# Patient Record
Sex: Male | Born: 1940 | Race: White | Hispanic: No | Marital: Married | Smoking: Former smoker
Health system: Southern US, Community
[De-identification: ages and names within clinical notes are randomized; demographics above are authoritative.]

## PROBLEM LIST (undated history)

## (undated) DIAGNOSIS — T7840XA Allergy, unspecified, initial encounter: Secondary | ICD-10-CM

## (undated) DIAGNOSIS — F32A Depression, unspecified: Secondary | ICD-10-CM

## (undated) DIAGNOSIS — I447 Left bundle-branch block, unspecified: Secondary | ICD-10-CM

## (undated) DIAGNOSIS — I472 Ventricular tachycardia, unspecified: Secondary | ICD-10-CM

## (undated) DIAGNOSIS — R9431 Abnormal electrocardiogram [ECG] [EKG]: Secondary | ICD-10-CM

## (undated) DIAGNOSIS — M1990 Unspecified osteoarthritis, unspecified site: Secondary | ICD-10-CM

## (undated) DIAGNOSIS — I10 Essential (primary) hypertension: Secondary | ICD-10-CM

## (undated) DIAGNOSIS — I495 Sick sinus syndrome: Secondary | ICD-10-CM

## (undated) DIAGNOSIS — R012 Other cardiac sounds: Secondary | ICD-10-CM

## (undated) DIAGNOSIS — G4730 Sleep apnea, unspecified: Secondary | ICD-10-CM

## (undated) DIAGNOSIS — Z950 Presence of cardiac pacemaker: Secondary | ICD-10-CM

## (undated) DIAGNOSIS — M5126 Other intervertebral disc displacement, lumbar region: Secondary | ICD-10-CM

## (undated) DIAGNOSIS — E7800 Pure hypercholesterolemia, unspecified: Secondary | ICD-10-CM

## (undated) DIAGNOSIS — L570 Actinic keratosis: Secondary | ICD-10-CM

## (undated) DIAGNOSIS — Z95810 Presence of automatic (implantable) cardiac defibrillator: Secondary | ICD-10-CM

## (undated) HISTORY — DX: Other intervertebral disc displacement, lumbar region: M51.26

## (undated) HISTORY — DX: Abnormal electrocardiogram (ECG) (EKG): R94.31

## (undated) HISTORY — PX: REPLACEMENT TOTAL KNEE: SUR1224

## (undated) HISTORY — DX: Ventricular tachycardia, unspecified: I47.20

## (undated) HISTORY — DX: Sick sinus syndrome: I49.5

## (undated) HISTORY — DX: Pure hypercholesterolemia, unspecified: E78.00

## (undated) HISTORY — PX: JOINT REPLACEMENT: SHX530

## (undated) HISTORY — DX: Unspecified osteoarthritis, unspecified site: M19.90

## (undated) HISTORY — DX: Sleep apnea, unspecified: G47.30

## (undated) HISTORY — DX: Other cardiac sounds: R01.2

## (undated) HISTORY — DX: Depression, unspecified: F32.A

## (undated) HISTORY — DX: Essential (primary) hypertension: I10

## (undated) HISTORY — PX: VASECTOMY: SHX75

## (undated) HISTORY — PX: OTHER SURGICAL HISTORY: SHX169

## (undated) HISTORY — DX: Allergy, unspecified, initial encounter: T78.40XA

## (undated) HISTORY — DX: Ventricular tachycardia: I47.2

## (undated) HISTORY — DX: Presence of cardiac pacemaker: Z95.0

## (undated) HISTORY — DX: Left bundle-branch block, unspecified: I44.7

## (undated) HISTORY — PX: INSERT / REPLACE / REMOVE PACEMAKER: SUR710

## (undated) HISTORY — DX: Presence of automatic (implantable) cardiac defibrillator: Z95.810

## (undated) HISTORY — PX: TONSILLECTOMY: SUR1361

## (undated) HISTORY — PX: CARDIAC PACEMAKER PLACEMENT: SHX583

---

## 1969-07-28 DIAGNOSIS — T148XXA Other injury of unspecified body region, initial encounter: Secondary | ICD-10-CM

## 1969-07-28 HISTORY — DX: Other injury of unspecified body region, initial encounter: T14.8XXA

## 1969-07-28 HISTORY — PX: VASECTOMY: SHX75

## 2003-11-28 DIAGNOSIS — R55 Syncope and collapse: Secondary | ICD-10-CM

## 2003-11-28 HISTORY — DX: Syncope and collapse: R55

## 2003-12-18 ENCOUNTER — Ambulatory Visit: Admit: 2003-12-18 | Disposition: A | Discharge status: Home / Self Care | Payer: Self-pay | Source: Ambulatory Visit

## 2003-12-29 DIAGNOSIS — I495 Sick sinus syndrome: Secondary | ICD-10-CM

## 2003-12-29 HISTORY — DX: Sick sinus syndrome: I49.5

## 2004-01-04 ENCOUNTER — Ambulatory Visit
Admission: AD | Admit: 2004-01-04 | Disposition: A | Discharge status: Home / Self Care | Payer: Self-pay | Source: Ambulatory Visit | Admitting: Cardiovascular Disease

## 2004-01-05 ENCOUNTER — Inpatient Hospital Stay
Admission: RE | Admit: 2004-01-05 | Disposition: A | Discharge status: Home / Self Care | Payer: Self-pay | Source: Ambulatory Visit | Admitting: Cardiovascular Disease

## 2005-09-12 ENCOUNTER — Ambulatory Visit: Admission: RE | Admit: 2005-09-12 | Payer: Self-pay | Source: Ambulatory Visit | Admitting: Gastroenterology

## 2007-03-12 ENCOUNTER — Emergency Department
Admission: EM | Admit: 2007-03-12 | Disposition: A | Discharge status: Home / Self Care | Payer: Self-pay | Source: Emergency Department | Admitting: Emergency Medicine

## 2007-03-13 LAB — COMPREHENSIVE METABOLIC PANEL
ALT: 42 U/L (ref 7–56)
AST (SGOT): 48 U/L — ABNORMAL HIGH (ref 5–40)
Albumin, Synovial: 5.1 g/dL — ABNORMAL HIGH (ref 3.9–5.0)
Alkaline Phosphatase: 112 U/L (ref 38–126)
BUN / Creatinine Ratio: 15 (ref 8–20)
BUN: 19 mg/dL (ref 6–20)
Bilirubin, Total: 0.8 mg/dL (ref 0.2–1.3)
CO2: 24 mmol/L (ref 21.0–31.0)
Calcium: 9 mg/dL (ref 8.4–10.2)
Chloride: 94 mmol/L — ABNORMAL LOW (ref 101–111)
Creatinine: 1.25 mg/dL (ref 0.5–1.4)
EGFR: >60 mL/min/{1.73_m2}
EGFR: >60 mL/min/{1.73_m2}
Glucose: 90 mg/dL (ref 70–100)
Potassium: 3.5 mmol/L — ABNORMAL LOW (ref 3.6–5.0)
Protein, Total: 8.5 g/dL — ABNORMAL HIGH (ref 6.3–8.2)
Sodium: 134 mmol/L — ABNORMAL LOW (ref 135–145)

## 2007-03-13 LAB — ^MANUAL DIFFERENTIAL MCKESSON
Band Neutrophils Manual: 8 %
CELLS COUNTED: 100
Lymphocytes Manual: 3 % — ABNORMAL LOW (ref 25–55)
Monocytes: 8 % (ref 1–8)
Platelet Evaluation: NORMAL
RBC Morphology: NORMAL
Segmented Neutrophils: 81 % — ABNORMAL HIGH (ref 49–69)

## 2007-03-13 LAB — URINALYSIS
Bilirubin, UA: NEGATIVE
Glucose, UA: NEGATIVE
Leukocyte Esterase, UA: NEGATIVE
Nitrate: NEGATIVE
Protein, UR: NEGATIVE
Specific Gravity, UR: 1.025 (ref 1.000–1.035)
Urobilinogen, UA: NORMAL
pH, Urine: 6 (ref 5.0–8.0)

## 2007-03-13 LAB — ^CBC WITH DIFF MCKESSON
Hematocrit: 41 % (ref 39.0–49.0)
Hemoglobin: 14.3 g/dL (ref 13.2–17.3)
MCH: 31.3 pg (ref 27.0–34.0)
MCHC: 34.8 % (ref 32.0–36.0)
MCV: 89.8 fL (ref 80–100)
Platelets: 254 10*3/uL (ref 150–400)
RBC: 4.56 /mm3 (ref 3.80–5.40)
RDW: 12.2 % (ref 11.0–14.0)
WBC: 9.4 10*3/uL (ref 4.8–10.8)

## 2007-03-13 LAB — URINALYSIS WITH MICROSCOPIC

## 2007-03-13 LAB — LIPASE: Lipase: 34 U/L (ref 23–300)

## 2007-03-13 LAB — CK: Creatine Kinase (CK): 120 U/L (ref 19–216)

## 2007-03-13 LAB — TROPONIN I: Troponin I: 0.016 ng/dL (ref 0.000–0.034)

## 2007-11-09 ENCOUNTER — Emergency Department
Admission: EM | Admit: 2007-11-09 | Disposition: A | Discharge status: Home / Self Care | Payer: Self-pay | Source: Emergency Department | Admitting: Emergency Medicine

## 2007-11-10 LAB — COMPREHENSIVE METABOLIC PANEL
ALT: 34 U/L (ref 7–56)
AST (SGOT): 35 U/L (ref 5–40)
Albumin, Synovial: 4.8 g/dL (ref 3.9–5.0)
Alkaline Phosphatase: 109 U/L (ref 38–126)
BUN / Creatinine Ratio: 15 (ref 8–20)
BUN: 18 mg/dL (ref 6–20)
Bilirubin, Total: 0.6 mg/dL (ref 0.2–1.3)
CO2: 22 mmol/L (ref 21.0–31.0)
Calcium: 9.4 mg/dL (ref 8.4–10.2)
Chloride: 98 mmol/L — ABNORMAL LOW (ref 101–111)
Creatinine: 1.25 mg/dL (ref 0.5–1.4)
EGFR: >60 mL/min/{1.73_m2}
EGFR: >60 mL/min/{1.73_m2}
Glucose: 111 mg/dL — ABNORMAL HIGH (ref 70–100)
Potassium: 3.6 mmol/L (ref 3.6–5.0)
Protein, Total: 7.9 g/dL (ref 6.3–8.2)
Sodium: 137 mmol/L (ref 135–145)

## 2007-11-10 LAB — ^MANUAL DIFFERENTIAL MCKESSON
Band Neutrophils Manual: 5 %
CELLS COUNTED: 100
Lymphocytes Manual: 2 % — ABNORMAL LOW (ref 25–55)
Monocytes: 8 % (ref 1–8)
Platelet Evaluation: NORMAL
RBC Morphology: NORMAL
Segmented Neutrophils: 85 % — ABNORMAL HIGH (ref 49–69)

## 2007-11-10 LAB — ^CBC WITH DIFF MCKESSON
Hematocrit: 39.3 % (ref 39.0–49.0)
Hemoglobin: 13.9 g/dL (ref 13.2–17.3)
MCH: 32 pg (ref 27.0–34.0)
MCHC: 35.3 % (ref 32.0–36.0)
MCV: 90.6 fL (ref 80–100)
Platelets: 236 10*3/uL (ref 150–400)
RBC: 4.34 /mm3 (ref 3.80–5.40)
RDW: 11.5 % (ref 11.0–14.0)
WBC: 8.6 10*3/uL (ref 4.8–10.8)

## 2010-11-27 HISTORY — PX: COLONOSCOPY: SHX174

## 2012-09-03 ENCOUNTER — Encounter: Payer: Self-pay | Admitting: Anesthesiology

## 2012-09-03 ENCOUNTER — Encounter
Admission: RE | Disposition: A | Discharge status: Home / Self Care | Payer: Self-pay | Source: Ambulatory Visit | Attending: Cardiovascular Disease

## 2012-09-03 ENCOUNTER — Ambulatory Visit: Payer: Medicare Other | Admitting: Cardiovascular Disease

## 2012-09-03 ENCOUNTER — Ambulatory Visit: Payer: Medicare Other | Admitting: Anesthesiology

## 2012-09-03 ENCOUNTER — Ambulatory Visit
Admission: RE | Admit: 2012-09-03 | Discharge: 2012-09-03 | Disposition: A | Discharge status: Home / Self Care | Payer: Medicare Other | Source: Ambulatory Visit | Attending: Cardiovascular Disease | Admitting: Cardiovascular Disease

## 2012-09-03 DIAGNOSIS — I10 Essential (primary) hypertension: Secondary | ICD-10-CM | POA: Insufficient documentation

## 2012-09-03 DIAGNOSIS — G4730 Sleep apnea, unspecified: Secondary | ICD-10-CM | POA: Insufficient documentation

## 2012-09-03 DIAGNOSIS — E785 Hyperlipidemia, unspecified: Secondary | ICD-10-CM | POA: Insufficient documentation

## 2012-09-03 DIAGNOSIS — Z7982 Long term (current) use of aspirin: Secondary | ICD-10-CM | POA: Insufficient documentation

## 2012-09-03 DIAGNOSIS — Z87891 Personal history of nicotine dependence: Secondary | ICD-10-CM | POA: Insufficient documentation

## 2012-09-03 DIAGNOSIS — I447 Left bundle-branch block, unspecified: Secondary | ICD-10-CM | POA: Insufficient documentation

## 2012-09-03 DIAGNOSIS — I442 Atrioventricular block, complete: Secondary | ICD-10-CM | POA: Insufficient documentation

## 2012-09-03 DIAGNOSIS — Z45018 Encounter for adjustment and management of other part of cardiac pacemaker: Secondary | ICD-10-CM | POA: Insufficient documentation

## 2012-09-03 SURGERY — PM GENERATOR CHANGE DUAL/BI-V
Anesthesia: Anesthesia General

## 2012-09-03 MED ORDER — HYDROMORPHONE HCL PF 1 MG/ML IJ SOLN
0.5000 mg | INTRAMUSCULAR | Status: DC | PRN
Start: 2012-09-03 — End: 2012-09-03

## 2012-09-03 MED ORDER — BACITRACIN 50000 UNITS IM SOLR
INTRAMUSCULAR | Status: AC
Start: 2012-09-03 — End: 2012-09-03
  Administered 2012-09-03: 50000 [IU]
  Filled 2012-09-03: qty 50000

## 2012-09-03 MED ORDER — LIDOCAINE HCL 2 % IJ SOLN
INTRAMUSCULAR | Status: DC | PRN
Start: 2012-09-03 — End: 2012-09-03
  Administered 2012-09-03: 60 mg

## 2012-09-03 MED ORDER — MIDAZOLAM HCL 2 MG/2ML IJ SOLN
INTRAMUSCULAR | Status: DC
Start: 2012-09-03 — End: 2012-09-03
  Filled 2012-09-03: qty 1

## 2012-09-03 MED ORDER — LACTATED RINGERS IV SOLN
INTRAVENOUS | Status: DC
Start: 2012-09-03 — End: 2012-09-03

## 2012-09-03 MED ORDER — PROPOFOL INFUSION 10 MG/ML
INTRAVENOUS | Status: DC | PRN
Start: 2012-09-03 — End: 2012-09-03
  Administered 2012-09-03: 80 ug/kg/min via INTRAVENOUS

## 2012-09-03 MED ORDER — ONDANSETRON HCL 4 MG/2ML IJ SOLN
INTRAMUSCULAR | Status: DC
Start: 2012-09-03 — End: 2012-09-03
  Filled 2012-09-03: qty 2

## 2012-09-03 MED ORDER — METOCLOPRAMIDE HCL 5 MG/ML IJ SOLN
10.0000 mg | Freq: Once | INTRAMUSCULAR | Status: DC | PRN
Start: 2012-09-03 — End: 2012-09-03

## 2012-09-03 MED ORDER — PROPOFOL 10 MG/ML IV EMUL
INTRAVENOUS | Status: DC
Start: 2012-09-03 — End: 2012-09-03
  Filled 2012-09-03: qty 1

## 2012-09-03 MED ORDER — ONDANSETRON HCL 4 MG/2ML IJ SOLN
INTRAMUSCULAR | Status: DC | PRN
Start: 2012-09-03 — End: 2012-09-03
  Administered 2012-09-03: 4 mg via INTRAVENOUS

## 2012-09-03 MED ORDER — MORPHINE SULFATE 2 MG/ML IJ/IV SOLN (WRAP)
2.0000 mg | INTRAVENOUS | Status: DC | PRN
Start: 2012-09-03 — End: 2012-09-03

## 2012-09-03 MED ORDER — PROMETHAZINE HCL 25 MG/ML IJ SOLN
6.2500 mg | Freq: Once | INTRAMUSCULAR | Status: DC | PRN
Start: 2012-09-03 — End: 2012-09-03

## 2012-09-03 MED ORDER — CEFAZOLIN 1 GM MBP (CNR)
Status: AC
Start: 2012-09-03 — End: 2012-09-03
  Administered 2012-09-03: 2 g via INTRAVENOUS
  Filled 2012-09-03: qty 100

## 2012-09-03 MED ORDER — MIDAZOLAM HCL 2 MG/2ML IJ SOLN
INTRAMUSCULAR | Status: DC | PRN
Start: 2012-09-03 — End: 2012-09-03
  Administered 2012-09-03: 2 mg via INTRAVENOUS

## 2012-09-03 MED ORDER — FENTANYL CITRATE 0.05 MG/ML IJ SOLN
INTRAMUSCULAR | Status: DC | PRN
Start: 2012-09-03 — End: 2012-09-03
  Administered 2012-09-03: 50 ug via INTRAVENOUS

## 2012-09-03 MED ORDER — LIDOCAINE HCL (PF) 1 % IJ SOLN
INTRAMUSCULAR | Status: AC
Start: 2012-09-03 — End: 2012-09-03
  Administered 2012-09-03: 10 mL via SUBCUTANEOUS
  Filled 2012-09-03: qty 1

## 2012-09-03 MED ORDER — MEPERIDINE HCL 25 MG/ML IJ SOLN
12.5000 mg | Freq: Two times a day (BID) | INTRAMUSCULAR | Status: DC | PRN
Start: 2012-09-03 — End: 2012-09-03

## 2012-09-03 MED ORDER — FENTANYL CITRATE 0.05 MG/ML IJ SOLN
INTRAMUSCULAR | Status: DC
Start: 2012-09-03 — End: 2012-09-03
  Filled 2012-09-03: qty 2

## 2012-09-03 MED ORDER — SODIUM CHLORIDE 0.9 % IV SOLN
INTRAVENOUS | Status: DC | PRN
Start: 2012-09-03 — End: 2012-09-03

## 2012-09-03 NOTE — H&P (Signed)
Justin Cisneros    Date of Birth:  December 26, 1940  Age:  71 yrs.  __  CURRENT DIAGNOSES       1. 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 - Dyspnea, 786.00    3. Sleep Apnea, 786.09    4. - Chest Pain, Unspecified, 786.50    5. - Abnormal EKG, 794.31    6. Pacemaker - Medtronic EnPulse [id number] DDDR (01/05/2004), V45.01    7. Abnormal heart sounds, 785.3    8. - Hypercholesterolemia, 272.0    9. - Hypertension, 401.1    10. 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 Aspirin 81 mg Tablet, Chewable, 1 p.o. q.d.    2. Trazodone 50 mg Tablet, 2 p.o. qHS    3. doxazosin 4 mg tablet, 1 po qd    4. losartan-hydrochlorothiazide 100-12.5 mg tablet, 1 po qd    5. simvastatin 40 mg tablet, 1 po qd    6. multivitamin capsule, 1 po qd  __  CHIEF COMPLAINT/REASON FOR VISIT  Followup of complete heart block  __  HISTORY OF PRESENT ILLNESS  Justin Cisneros returned for follow-up of his pacemaker recently, and his device was found to be at [facility]. He has been feeling more easily tired since the device went into ERI status and changed to VVI at a fixed rate of 65 bpm one month ago. Otherwise, he has not had any dyspnea, chest discomfort, lightheadedness, orthopnea, PND or ankle swelling.    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, Rate related LBBB;  Infectious Diseases:  Usual childhood illnesses of mumps, measles and chickenpox;  Surgical Procedures:  Medtronic pacemaker placement 01/05/2004, vasectomy;  Trauma History:  No previous history of significant trauma.;  Cardiology Procedures-Invasive:  No previous interventional or invasive cardiology procedures.;  Cardiology Procedures-Noninvasive:  Chemical Dual Isotope December 2004, 12/08, 4/11, Echo 1/05, Holter 2/05;  Left Ventricular Ejection Fraction:  LVEF of 47% documented via nuclear study on 03/24/2010  __  FAMILY HISTORY     Father - died  form emphysema at age 55;  Mother - died at 38 and Osteoporosis;   __  SOCIAL HISTORY       Alcohol Use: drinks daily;  Smoking: Quit 1/05; Diet: Regular diet without modifications and Caffeine use-3-4 per day; Exercise: Exercises regularly, walking and weights; Occupation: Retired from [employer];   __  REVIEW OF SYSTEMS    General: feels well, no change in exercise tolerance.; Integumentary: Denies any change in hair or nails, rashes, or skin lesions.; Eyes: wears eye glasses/contact lenses; Ears, Nose, Throat, Mouth: Denies any hearing loss, epistaxis, hoarseness or difficulty speaking.;Respiratory: sleep apnea; Cardiovascular: Please review HPI; Abdominal : Denies ulcer disease, hematochezia or melena.;Musculoskeletal:Denies any history of venous insufficiency, arthritic symptoms or back problems.; Neurological : Denies any history of recurrent strokes, TIA, or seizure disorder.; Psychiatric: stress associated due to his wife being dx. with cancer; Endocrine: hyperlipidemia; Hematologic/Immunologic: Denies any food allergies, seasonal allergies, bleeding disorders.  __  PHYSICAL EXAMINATION    Vital Signs:  Blood Pressure:              Constitutional: Cooperative, alert and oriented,well developed, well nourished, in no acute distress. Skin: Warm and dry to touch, no apparent skin lesions, or masses noted. Head: Normocephalic,  normal hair pattern, no masses or tenderness Eyes: EOMS Intact, PERRL, conjunctivae and lids unremarkable.  Funduscopic exam and visual fields not performed. ENT: Ears, Nose and throat reveal no gross abnormalities.  No pallor or cyanosis.  Dentition good. Neck: No palpable masses or adenopathy, no thyromegaly, no JVD, carotid pulses are full and equal bilaterally without bruits. Chest: Normal symmetry, no tenderness to palpation, normal respiratory excursion, no intercostal retraction, no use of accessory muscles, normal diaphragmatic excursion, clear to auscultation and percussion., incision  healing well Cardiac: Regular rhythm, S1 normal, S2 normal, Apical impulse not displaced, no murmurs, gallops or rubs detected Abdomen: Abdomen soft, bowel sounds normoactive, no masses, no hepatosplenomegaly, non-tender, no bruits Peripheral Pulses: The femoral, popliteal, dorsalis pedis, and posterior tibial pulses are full and equal bilaterally with no bruits auscultated.    Extremities/Back: No deformities, clubbing, cyanosis, erythema or edema observed.  There are no spinal abnormalities noted. Normal muscle strength and tone.   Neurological: No gross motor or sensory deficits noted, affect appropriate, oriented to time, person and place.   __    Medications added today by the physician:      MPRESSION:  1. Pacemaker battery depletion  2. 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 Hypertension well controlled  4. Hyperlipidemia well controlled.  5. Sleep apnea.    RECOMMENDATION:  I will perform pacemaker generator replacement today.    Justin Cisneros L. Justin Mage, MD    cc:          Justin Cisneros. [facility] MD

## 2012-09-03 NOTE — Brief Op Note (Signed)
HANDS-OFF REPORT GIVEN TO J RIDDERFORD

## 2012-09-03 NOTE — Progress Notes (Signed)
Pt tolerated the procedure well. Pt was given discharge instructions by Dr Trish Mage and there was no questions. Pt is aware of the need to take antibiotics as prescribed by Dr Trish Mage. Pt had no questions. Pt discharged home with his girlfriend.

## 2012-09-03 NOTE — Anesthesia Preprocedure Evaluation (Signed)
Anesthesia Evaluation    AIRWAY    Mallampati: II    TM distance: >3 FB  Neck ROM: full  Mouth Opening:full   CARDIOVASCULAR    cardiovascular exam normal     DENTAL         PULMONARY    pulmonary exam normal     OTHER FINDINGS                  Anesthesia Plan    ASA 2   general   Detailed anesthesia plan: general IV      Post op pain management: per surgeon        intravenous induction     informed consent obtained    Plan discussed with CRNA.             Chart reviewed, labs reviewed, patient interviewed, history confirmed. Surgical procedure, including location, confirmed with patient.      Allergies:  No Known Allergies  All Rx:  Scheduled Meds:     Continuous Infusions:     PRN Meds:.     Problem List:  There are no active problems to display for this patient.    History:  Past Medical History   Diagnosis Date   . Sleep apnea    . Hypertensive disorder    . Bradycardia    . Sick sinus syndrome    . Dyspnea      Past Surgical History   Procedure Date   . Cardiac pacemaker placement    . Vasectomy      Labs    Lab Results   Component Value Date    WBC 8.6 11/09/2007    HGB 13.9 11/09/2007    HCT 39.3 11/09/2007    PLT 236 11/09/2007    ALT 34 11/09/2007    AST 35 11/09/2007    NA 137 11/09/2007    K 3.6 11/09/2007    CL 98* 11/09/2007    CO2 22 11/09/2007    CREAT 1.25 11/09/2007    BUN 18 11/09/2007    GLU 111* 11/09/2007     Water this am at 730        Patient deemed acceptable risk to proceed with planned procedure and anesthesia.    Anesthesia plan discussed fully with patient. Risk, benefits and alternatives discussed in detail. Patient understands and agrees with plan. See paper chart for signed informed consent.

## 2012-09-03 NOTE — Transfer of Care (Signed)
Anesthesia Transfer of Care Note    Patient: Justin Cisneros    Procedures performed: Procedure(s) with comments:  PM GENERATOR CHANGE DUAL/BI-V    Anesthesia type: General TIVA    Patient location:PACU    Last vitals:   Filed Vitals:    09/03/12 1206   BP: 88/57   Pulse: 60   Temp: 97 F (36.1 C)   Resp: 16   SpO2: 99%       Post pain: Patient not complaining of pain, continue current therapy     Mental Status:awake    Respiratory Function: tolerating room air    Cardiovascular: stable    Nausea/Vomiting: patient not complaining of nausea or vomiting    Hydration Status: adequate    Post assessment: no apparent anesthetic complications

## 2012-09-03 NOTE — Anesthesia Postprocedure Evaluation (Signed)
At the conclusion of the procedure, the patient was uneventfully transported to the PACU in stable condition with good ventilatory exchange. Uneventful transition to PACU care.    At this time the patient is awake/easily arousable. The patient's respirations, and cardiovascular status have been evaluated and deemed stable. Post op nausea, vomiting and pain are being evaluated, treated and controlled as effectively as possible without compromising the patients respiratory and cardiovascular status.    Review of input and output information, assessment of cardiovascular course and current physical findings are consistent with adequate hydrational support. Please refer to PACU nursing documentation for confirmation of attainment of normothermia.    There were no anesthetic-related complications evident at this time.    The patient is recovering well and a smooth transition to the next phase of care is anticipated.

## 2012-09-03 NOTE — Brief Op Note (Signed)
Procedure: PPM Gen Change  Dx: Battery depletion, CHB  Electrophysiologist: Zollie Beckers L. Trish Mage MD  Anesthesia: General  EBL: Minimal  Complications: None

## 2012-11-29 NOTE — Op Note (Signed)
MRN: [id number] DOCUMENT ID: [id number]      INTRODUCTION:      72 YEAR OLD MALE PATIENT PRESENTS FOR AN ELECTIVE OUTPATIENT COLONOSCOPY.      THE INDICATION FOR THE PROCEDURE WAS AVERAGE RISK SCREENING FOR COLON      CANCER.      CLINICAL HISTORY  PHYSICAL EXAMINATION:      THE PATIENT'S CLINICAL HISTORY AND PHYSICAL EXAMINATION WERE PERFORMED AND      ARE DOCUMENTED IN THE PATIENT'S RECORD.      CONSENT:      THE BENEFITS, RISKS, AND ALTERNATIVES TO THE PROCEDURE WERE DISCUSSED AND      INFORMED CONSENT WAS OBTAINED FROM THE PATIENT.      PREPARATION:      EKG, PULSE, PULSE OXIMETRY AND BLOOD PRESSURE MONITORED.      MEDICATIONS:      - MAC ANESTHESIA WAS ADMINISTERED DURING THE PROCEDURE      PROCEDURE:      RECTAL EXAM: NORMAL.      THE ENDOSCOPE WAS PASSED WITH EASE UNDER DIRECT VISUALIZATION TO THE CECUM      CONFIRMED BY LANDMARKS, APPENDICEAL ORIFICE, CECAL STRAP (CROW'S FOOT),      ILEOCECAL VALVE AND RLQ PALPATION.  RETROFLEXION WAS PERFORMED IN THE      RECTUM.  THE QUALITY OF THE PREPARATION WAS FAIR.      FINDINGS:  THERE WERE MULTIPLE MEDIUM DIVERTICULA PRESENT IN THE SIGMOID,      THE DESCENDING COLON, THE TRANSVERSE COLON AND THE ASCENDING COLON.  THE      COLONOSCOPY WAS OTHERWISE NORMAL.      COMPLICATIONS:      THERE WERE NO COMPLICATIONS ASSOCIATED WITH THE PROCEDURE.      IMPRESSION:      1.  MULTIPLE MEDIUM DIVERTICULA IN THE SIGMOID, THE DESCENDING COLON, THE      TRANSVERSE COLON AND THE ASCENDING COLON. [562.10].      2.  COLONOSCOPY, OTHERWISE NORMAL.      RECOMMENDATION:      - HIGH FIBER DIET.      - REPEAT COLONOSCOPY IN 7 TO 10 YEARS.      PROCEDURE CODES:      11914: COLONOSCOPY TO CECUM.      SIGNING PHYSICIAN: Twanna Resh,R A

## 2013-06-09 ENCOUNTER — Ambulatory Visit
Admission: RE | Admit: 2013-06-09 | Discharge: 2013-06-09 | Disposition: A | Discharge status: Home / Self Care | Payer: Medicare Other | Source: Ambulatory Visit | Attending: Orthopaedic Surgery | Admitting: Orthopaedic Surgery

## 2013-06-09 ENCOUNTER — Other Ambulatory Visit: Payer: Self-pay | Admitting: Orthopaedic Surgery

## 2013-06-09 DIAGNOSIS — M2390 Unspecified internal derangement of unspecified knee: Secondary | ICD-10-CM | POA: Insufficient documentation

## 2013-06-09 DIAGNOSIS — M1710 Unilateral primary osteoarthritis, unspecified knee: Secondary | ICD-10-CM | POA: Insufficient documentation

## 2013-11-12 ENCOUNTER — Ambulatory Visit: Payer: Medicare Other

## 2013-11-12 NOTE — Pre-Procedure Instructions (Addendum)
DOS Dec 02 728 ARRIVE 0600  PT WENT TO JOINT CAMP WITH HIS WIFE DEC 16

## 2013-11-21 ENCOUNTER — Other Ambulatory Visit: Payer: Medicare Other

## 2013-11-24 ENCOUNTER — Ambulatory Visit
Admission: RE | Admit: 2013-11-24 | Discharge: 2013-11-24 | Disposition: A | Discharge status: Home / Self Care | Payer: Medicare Other | Source: Ambulatory Visit | Attending: Orthopaedic Surgery | Admitting: Orthopaedic Surgery

## 2013-11-24 DIAGNOSIS — Z01818 Encounter for other preprocedural examination: Secondary | ICD-10-CM | POA: Insufficient documentation

## 2013-12-01 ENCOUNTER — Inpatient Hospital Stay: Admission: RE | Admit: 2013-12-01 | Payer: Medicare Other | Source: Ambulatory Visit

## 2013-12-01 NOTE — Anesthesia Preprocedure Evaluation (Addendum)
Anesthesia Evaluation    AIRWAY    Mallampati: II    TM distance: >3 FB  Neck ROM: full  Mouth Opening:full   CARDIOVASCULAR    cardiovascular exam normal       DENTAL    No notable dental hx     PULMONARY    pulmonary exam normal and clear to auscultation     OTHER FINDINGS              73 yo M with HTN, SSS (s/p pacer)          Anesthesia Plan    ASA 3     general               (NSR Pacer in place for SSS  Cleared by cardiology)              Post op pain management: per surgeon    informed consent obtained

## 2013-12-02 ENCOUNTER — Ambulatory Visit: Payer: Medicare Other | Admitting: Orthopaedic Surgery

## 2013-12-02 ENCOUNTER — Inpatient Hospital Stay: Payer: Medicare Other

## 2013-12-02 ENCOUNTER — Inpatient Hospital Stay: Payer: Medicare Other | Admitting: Anesthesiology

## 2013-12-02 ENCOUNTER — Encounter: Payer: Self-pay | Admitting: Anesthesiology

## 2013-12-02 ENCOUNTER — Ambulatory Visit
Admission: RE | Admit: 2013-12-02 | Discharge: 2013-12-03 | Disposition: A | Discharge status: Home / Self Care | Payer: Medicare Other | Source: Ambulatory Visit | Attending: Orthopaedic Surgery | Admitting: Orthopaedic Surgery

## 2013-12-02 ENCOUNTER — Encounter
Admission: RE | Disposition: A | Discharge status: Home / Self Care | Payer: Self-pay | Source: Ambulatory Visit | Attending: Orthopaedic Surgery

## 2013-12-02 DIAGNOSIS — Z7982 Long term (current) use of aspirin: Secondary | ICD-10-CM | POA: Insufficient documentation

## 2013-12-02 DIAGNOSIS — K5900 Constipation, unspecified: Secondary | ICD-10-CM | POA: Insufficient documentation

## 2013-12-02 DIAGNOSIS — IMO0002 Reserved for concepts with insufficient information to code with codable children: Secondary | ICD-10-CM | POA: Insufficient documentation

## 2013-12-02 DIAGNOSIS — M1990 Unspecified osteoarthritis, unspecified site: Secondary | ICD-10-CM | POA: Diagnosis present

## 2013-12-02 DIAGNOSIS — I495 Sick sinus syndrome: Secondary | ICD-10-CM | POA: Insufficient documentation

## 2013-12-02 DIAGNOSIS — E785 Hyperlipidemia, unspecified: Secondary | ICD-10-CM | POA: Insufficient documentation

## 2013-12-02 DIAGNOSIS — I10 Essential (primary) hypertension: Secondary | ICD-10-CM | POA: Insufficient documentation

## 2013-12-02 DIAGNOSIS — Z950 Presence of cardiac pacemaker: Secondary | ICD-10-CM

## 2013-12-02 DIAGNOSIS — Z95810 Presence of automatic (implantable) cardiac defibrillator: Secondary | ICD-10-CM | POA: Insufficient documentation

## 2013-12-02 DIAGNOSIS — Z79899 Other long term (current) drug therapy: Secondary | ICD-10-CM | POA: Insufficient documentation

## 2013-12-02 DIAGNOSIS — Z9989 Dependence on other enabling machines and devices: Secondary | ICD-10-CM | POA: Diagnosis present

## 2013-12-02 DIAGNOSIS — I447 Left bundle-branch block, unspecified: Secondary | ICD-10-CM | POA: Insufficient documentation

## 2013-12-02 DIAGNOSIS — Z7901 Long term (current) use of anticoagulants: Secondary | ICD-10-CM | POA: Insufficient documentation

## 2013-12-02 DIAGNOSIS — G4733 Obstructive sleep apnea (adult) (pediatric): Secondary | ICD-10-CM | POA: Insufficient documentation

## 2013-12-02 DIAGNOSIS — Z87891 Personal history of nicotine dependence: Secondary | ICD-10-CM | POA: Insufficient documentation

## 2013-12-02 HISTORY — PX: ARTHROPLASTY, KNEE, UNICONDYLAR PROSTHESIS, MAKOPLASTY: SHX3141

## 2013-12-02 LAB — TYPE AND SCREEN
AB Screen Gel: NEGATIVE
ABO Rh: O NEG

## 2013-12-02 LAB — HEMOGLOBIN AND HEMATOCRIT, BLOOD
Hematocrit: 37.4 % — ABNORMAL LOW (ref 42.0–52.0)
Hgb: 12 g/dL — ABNORMAL LOW (ref 13.0–17.0)

## 2013-12-02 SURGERY — ARTHROPLASTY, KNEE, UNICONDYLAR PROSTHESIS, MAKOPLASTY
Anesthesia: Anesthesia General | Site: Knee | Laterality: Right | Wound class: Clean

## 2013-12-02 MED ORDER — DOCUSATE SODIUM 100 MG PO CAPS
100.0000 mg | ORAL_CAPSULE | Freq: Two times a day (BID) | ORAL | Status: DC
Start: 2013-12-02 — End: 2013-12-03
  Administered 2013-12-02 – 2013-12-03 (×2): 100 mg via ORAL
  Filled 2013-12-02 (×2): qty 1

## 2013-12-02 MED ORDER — LIDOCAINE HCL 2 % IJ SOLN
INTRAMUSCULAR | Status: DC | PRN
Start: 2013-12-02 — End: 2013-12-02
  Administered 2013-12-02: 60 mg

## 2013-12-02 MED ORDER — BUPIVACAINE-EPINEPHRINE (PF) 0.5% -1:200000 IJ SOLN
INTRAMUSCULAR | Status: AC
Start: 2013-12-02 — End: ?
  Filled 2013-12-02: qty 30

## 2013-12-02 MED ORDER — LIDOCAINE 1% BUFFERED - CNR/OUTSOURCED
INTRAMUSCULAR | Status: AC
Start: 2013-12-02 — End: ?
  Filled 2013-12-02: qty 22

## 2013-12-02 MED ORDER — PHENYLEPHRINE HCL 10 MG/ML IJ SOLN
INTRAMUSCULAR | Status: AC
Start: 2013-12-02 — End: ?
  Filled 2013-12-02: qty 1

## 2013-12-02 MED ORDER — SODIUM CHLORIDE 0.9 % IV MBP
1.0000 g | Freq: Three times a day (TID) | INTRAVENOUS | Status: AC
Start: 2013-12-02 — End: 2013-12-02
  Administered 2013-12-02 (×2): 1 g via INTRAVENOUS
  Filled 2013-12-02 (×2): qty 1000

## 2013-12-02 MED ORDER — NON FORMULARY
100.0000 mg | Freq: Every day | Status: DC
Start: 2013-12-02 — End: 2013-12-02

## 2013-12-02 MED ORDER — RIVAROXABAN 10 MG PO TABS
10.0000 mg | ORAL_TABLET | Freq: Every day | ORAL | Status: DC
Start: 2013-12-02 — End: 2017-09-24

## 2013-12-02 MED ORDER — LIDOCAINE 1% BUFFERED - CNR/OUTSOURCED
0.3000 mL | Freq: Once | INTRAMUSCULAR | Status: AC
Start: 2013-12-02 — End: 2013-12-02
  Administered 2013-12-02: 0.3 mL via INTRADERMAL

## 2013-12-02 MED ORDER — OXYCODONE HCL ER 10 MG PO T12A
20.0000 mg | EXTENDED_RELEASE_TABLET | Freq: Once | ORAL | Status: AC
Start: 2013-12-02 — End: 2013-12-02

## 2013-12-02 MED ORDER — HYDROMORPHONE HCL PF 1 MG/ML IJ SOLN
INTRAMUSCULAR | Status: AC
Start: 2013-12-02 — End: 2013-12-02
  Administered 2013-12-02: 0.5 mg via INTRAVENOUS
  Filled 2013-12-02: qty 1

## 2013-12-02 MED ORDER — CELECOXIB 200 MG PO CAPS
ORAL_CAPSULE | ORAL | Status: AC
Start: 2013-12-02 — End: 2013-12-02
  Administered 2013-12-02: 200 mg via ORAL
  Filled 2013-12-02: qty 1

## 2013-12-02 MED ORDER — DIPHENHYDRAMINE HCL 50 MG/ML IJ SOLN
12.5000 mg | INTRAMUSCULAR | Status: DC | PRN
Start: 2013-12-02 — End: 2013-12-02

## 2013-12-02 MED ORDER — HYDRALAZINE HCL 20 MG/ML IJ SOLN
5.0000 mg | Freq: Once | INTRAMUSCULAR | Status: DC
Start: 2013-12-02 — End: 2013-12-02

## 2013-12-02 MED ORDER — OXYCODONE HCL ER 10 MG PO T12A
EXTENDED_RELEASE_TABLET | ORAL | Status: AC
Start: 2013-12-02 — End: 2013-12-02
  Administered 2013-12-02: 20 mg via ORAL
  Filled 2013-12-02: qty 2

## 2013-12-02 MED ORDER — LACTATED RINGERS IV SOLN
INTRAVENOUS | Status: DC
Start: 2013-12-02 — End: 2013-12-02
  Administered 2013-12-02: 1000 mL via INTRAVENOUS
  Administered 2013-12-02: 20 mL/h via INTRAVENOUS

## 2013-12-02 MED ORDER — PHENYLEPHRINE 100 MCG/ML IV BOLUS (ANESTHESIA)
PREFILLED_SYRINGE | INTRAVENOUS | Status: DC | PRN
Start: 2013-12-02 — End: 2013-12-02
  Administered 2013-12-02 (×5): 100 ug via INTRAVENOUS

## 2013-12-02 MED ORDER — CEFAZOLIN SODIUM 1 G IJ SOLR
2.0000 g | Freq: Once | INTRAMUSCULAR | Status: AC
Start: 2013-12-02 — End: 2013-12-02
  Administered 2013-12-02: 2 g via INTRAVENOUS

## 2013-12-02 MED ORDER — OXYCODONE-ACETAMINOPHEN 5-325 MG PO TABS
2.0000 | ORAL_TABLET | ORAL | Status: DC | PRN
Start: 2013-12-02 — End: 2013-12-03
  Administered 2013-12-02 – 2013-12-03 (×3): 2 via ORAL
  Filled 2013-12-02 (×3): qty 2

## 2013-12-02 MED ORDER — HYDROMORPHONE HCL PF 1 MG/ML IJ SOLN
0.5000 mg | INTRAMUSCULAR | Status: DC | PRN
Start: 2013-12-02 — End: 2013-12-02
  Administered 2013-12-02 (×2): 0.5 mg via INTRAVENOUS

## 2013-12-02 MED ORDER — DOXAZOSIN MESYLATE 4 MG PO TABS
4.0000 mg | ORAL_TABLET | Freq: Every evening | ORAL | Status: DC
Start: 2013-12-02 — End: 2013-12-03
  Administered 2013-12-02: 4 mg via ORAL
  Filled 2013-12-02 (×3): qty 1

## 2013-12-02 MED ORDER — MORPHINE SULFATE 2 MG/ML IJ/IV SOLN (WRAP)
2.0000 mg | INTRAVENOUS | Status: DC | PRN
Start: 2013-12-02 — End: 2013-12-02

## 2013-12-02 MED ORDER — LOSARTAN POTASSIUM 25 MG PO TABS
100.0000 mg | ORAL_TABLET | Freq: Every day | ORAL | Status: DC
Start: 2013-12-02 — End: 2013-12-03
  Administered 2013-12-02: 100 mg via ORAL
  Filled 2013-12-02: qty 4

## 2013-12-02 MED ORDER — HYDROCHLOROTHIAZIDE 12.5 MG PO TABS
12.5000 mg | ORAL_TABLET | Freq: Every day | ORAL | Status: DC
Start: 2013-12-02 — End: 2013-12-03
  Administered 2013-12-02: 12.5 mg via ORAL
  Filled 2013-12-02: qty 1

## 2013-12-02 MED ORDER — BACITRACIN 500 UNIT/GM EX OINT
TOPICAL_OINTMENT | CUTANEOUS | Status: AC
Start: 2013-12-02 — End: ?
  Filled 2013-12-02: qty 0.9

## 2013-12-02 MED ORDER — SODIUM CHLORIDE 0.9 % IV SOLN
INTRAVENOUS | Status: DC
Start: 2013-12-02 — End: 2013-12-03

## 2013-12-02 MED ORDER — TRAZODONE HCL 100 MG PO TABS
100.0000 mg | ORAL_TABLET | Freq: Every evening | ORAL | Status: DC
Start: 2013-12-02 — End: 2013-12-03
  Administered 2013-12-02: 100 mg via ORAL
  Filled 2013-12-02: qty 1

## 2013-12-02 MED ORDER — MORPHINE SULFATE 10 MG/ML IJ SOLN
INTRAMUSCULAR | Status: DC | PRN
Start: 2013-12-02 — End: 2013-12-02
  Administered 2013-12-02: 10 mg via INTRAMUSCULAR

## 2013-12-02 MED ORDER — OXYCODONE-ACETAMINOPHEN 5-325 MG PO TABS
1.0000 | ORAL_TABLET | Freq: Once | ORAL | Status: DC | PRN
Start: 2013-12-02 — End: 2013-12-02

## 2013-12-02 MED ORDER — PROPOFOL 10 MG/ML IV EMUL
INTRAVENOUS | Status: AC
Start: 2013-12-02 — End: ?
  Filled 2013-12-02: qty 20

## 2013-12-02 MED ORDER — PROPOFOL INFUSION 10 MG/ML
INTRAVENOUS | Status: DC | PRN
Start: 2013-12-02 — End: 2013-12-02
  Administered 2013-12-02: 100 mg via INTRAVENOUS

## 2013-12-02 MED ORDER — ALBUTEROL SULFATE (2.5 MG/3ML) 0.083% IN NEBU
2.5000 mg | INHALATION_SOLUTION | Freq: Once | RESPIRATORY_TRACT | Status: DC
Start: 2013-12-02 — End: 2013-12-02

## 2013-12-02 MED ORDER — OXYCODONE-ACETAMINOPHEN 5-325 MG PO TABS
1.0000 | ORAL_TABLET | ORAL | Status: DC | PRN
Start: 2013-12-02 — End: 2017-09-24

## 2013-12-02 MED ORDER — MIDAZOLAM HCL 2 MG/2ML IJ SOLN
INTRAMUSCULAR | Status: AC
Start: 2013-12-02 — End: ?
  Filled 2013-12-02: qty 2

## 2013-12-02 MED ORDER — MAGNESIUM HYDROXIDE 400 MG/5ML PO SUSP
10.0000 mL | Freq: Every day | ORAL | Status: DC | PRN
Start: 2013-12-02 — End: 2013-12-03

## 2013-12-02 MED ORDER — HYDROMORPHONE HCL PF 1 MG/ML IJ SOLN
0.5000 mg | INTRAMUSCULAR | Status: DC | PRN
Start: 2013-12-02 — End: 2013-12-03
  Administered 2013-12-02 – 2013-12-03 (×3): 0.5 mg via INTRAVENOUS
  Filled 2013-12-02 (×3): qty 1

## 2013-12-02 MED ORDER — HYDROMORPHONE HCL PF 1 MG/ML IJ SOLN
INTRAMUSCULAR | Status: AC
Start: 2013-12-02 — End: 2013-12-03
  Filled 2013-12-02: qty 1

## 2013-12-02 MED ORDER — PREGABALIN 100 MG PO CAPS
100.0000 mg | ORAL_CAPSULE | Freq: Once | ORAL | Status: AC
Start: 2013-12-02 — End: 2013-12-02

## 2013-12-02 MED ORDER — FENTANYL CITRATE 0.05 MG/ML IJ SOLN
INTRAMUSCULAR | Status: AC
Start: 2013-12-02 — End: ?
  Filled 2013-12-02: qty 2

## 2013-12-02 MED ORDER — OXYCODONE HCL ER 10 MG PO T12A
10.0000 mg | EXTENDED_RELEASE_TABLET | Freq: Two times a day (BID) | ORAL | Status: DC
Start: 2013-12-02 — End: 2013-12-03
  Administered 2013-12-02 – 2013-12-03 (×2): 10 mg via ORAL
  Filled 2013-12-02 (×2): qty 1

## 2013-12-02 MED ORDER — ONDANSETRON HCL 4 MG/2ML IJ SOLN
INTRAMUSCULAR | Status: DC | PRN
Start: 2013-12-02 — End: 2013-12-02
  Administered 2013-12-02: 4 mg via INTRAVENOUS

## 2013-12-02 MED ORDER — MORPHINE SULFATE 10 MG/ML IJ/IV SOLN (WRAP)
Status: AC
Start: 2013-12-02 — End: ?
  Filled 2013-12-02: qty 1

## 2013-12-02 MED ORDER — CELECOXIB 200 MG PO CAPS
200.0000 mg | ORAL_CAPSULE | Freq: Once | ORAL | Status: AC
Start: 2013-12-02 — End: 2013-12-02

## 2013-12-02 MED ORDER — VITAMIN C 500 MG PO TABS
500.0000 mg | ORAL_TABLET | Freq: Every day | ORAL | Status: DC
Start: 2013-12-02 — End: 2013-12-03
  Administered 2013-12-02 – 2013-12-03 (×2): 500 mg via ORAL
  Filled 2013-12-02 (×2): qty 1

## 2013-12-02 MED ORDER — FERROUS SULFATE 324 (65 FE) MG PO TBEC
324.0000 mg | DELAYED_RELEASE_TABLET | Freq: Every morning | ORAL | Status: DC
Start: 2013-12-03 — End: 2013-12-03
  Administered 2013-12-03: 324 mg via ORAL
  Filled 2013-12-02: qty 1

## 2013-12-02 MED ORDER — CEFAZOLIN SODIUM 1 G IJ SOLR
INTRAMUSCULAR | Status: AC
Start: 2013-12-02 — End: 2013-12-02
  Filled 2013-12-02: qty 2000

## 2013-12-02 MED ORDER — DSS 100 MG PO CAPS
100.0000 mg | ORAL_CAPSULE | Freq: Two times a day (BID) | ORAL | Status: DC
Start: 2013-12-02 — End: 2017-09-24

## 2013-12-02 MED ORDER — ONDANSETRON HCL 4 MG/2ML IJ SOLN
4.0000 mg | Freq: Three times a day (TID) | INTRAMUSCULAR | Status: DC | PRN
Start: 2013-12-02 — End: 2013-12-03

## 2013-12-02 MED ORDER — PREGABALIN 100 MG PO CAPS
ORAL_CAPSULE | ORAL | Status: AC
Start: 2013-12-02 — End: 2013-12-02
  Administered 2013-12-02: 100 mg via ORAL
  Filled 2013-12-02: qty 1

## 2013-12-02 MED ORDER — LIDOCAINE HCL (PF) 2 % IJ SOLN
INTRAMUSCULAR | Status: AC
Start: 2013-12-02 — End: ?
  Filled 2013-12-02: qty 5

## 2013-12-02 MED ORDER — DIPHENHYDRAMINE HCL 25 MG PO CAPS
25.0000 mg | ORAL_CAPSULE | Freq: Two times a day (BID) | ORAL | Status: DC | PRN
Start: 2013-12-02 — End: 2013-12-03

## 2013-12-02 MED ORDER — FENTANYL CITRATE 0.05 MG/ML IJ SOLN
INTRAMUSCULAR | Status: DC | PRN
Start: 2013-12-02 — End: 2013-12-02
  Administered 2013-12-02 (×4): 25 ug via INTRAVENOUS

## 2013-12-02 MED ORDER — SIMVASTATIN 40 MG PO TABS
40.0000 mg | ORAL_TABLET | Freq: Every evening | ORAL | Status: DC
Start: 2013-12-02 — End: 2013-12-03
  Filled 2013-12-02: qty 1

## 2013-12-02 MED ORDER — KETOROLAC TROMETHAMINE 30 MG/ML IJ SOLN
INTRAMUSCULAR | Status: DC | PRN
Start: 2013-12-02 — End: 2013-12-02
  Administered 2013-12-02: 30 mg via INTRAMUSCULAR

## 2013-12-02 MED ORDER — MEPERIDINE HCL 25 MG/ML IJ SOLN
12.5000 mg | INTRAMUSCULAR | Status: DC | PRN
Start: 2013-12-02 — End: 2013-12-02

## 2013-12-02 MED ORDER — PROMETHAZINE HCL 25 MG/ML IJ SOLN
6.2500 mg | Freq: Once | INTRAMUSCULAR | Status: DC | PRN
Start: 2013-12-02 — End: 2013-12-02

## 2013-12-02 MED ORDER — RIVAROXABAN 10 MG PO TABS
10.0000 mg | ORAL_TABLET | Freq: Every morning | ORAL | Status: DC
Start: 2013-12-03 — End: 2013-12-03
  Administered 2013-12-03: 10 mg via ORAL
  Filled 2013-12-02: qty 1

## 2013-12-02 MED ORDER — OXYCODONE HCL ER 10 MG PO T12A
10.0000 mg | EXTENDED_RELEASE_TABLET | Freq: Two times a day (BID) | ORAL | Status: DC
Start: 2013-12-02 — End: 2017-09-24

## 2013-12-02 MED ORDER — ONDANSETRON HCL 4 MG/2ML IJ SOLN
4.0000 mg | Freq: Once | INTRAMUSCULAR | Status: DC | PRN
Start: 2013-12-02 — End: 2013-12-02

## 2013-12-02 MED ORDER — MIDAZOLAM HCL 2 MG/2ML IJ SOLN
INTRAMUSCULAR | Status: DC | PRN
Start: 2013-12-02 — End: 2013-12-02
  Administered 2013-12-02: 2 mg via INTRAVENOUS

## 2013-12-02 MED ORDER — BUPIVACAINE-EPINEPHRINE (PF) 0.5% -1:200000 IJ SOLN
INTRAMUSCULAR | Status: DC | PRN
Start: 2013-12-02 — End: 2013-12-02
  Administered 2013-12-02: 30 mL via INTRAMUSCULAR

## 2013-12-02 MED ORDER — ONDANSETRON HCL 4 MG/2ML IJ SOLN
INTRAMUSCULAR | Status: AC
Start: 2013-12-02 — End: ?
  Filled 2013-12-02: qty 2

## 2013-12-02 MED ORDER — KETOROLAC TROMETHAMINE 30 MG/ML IJ SOLN
INTRAMUSCULAR | Status: AC
Start: 2013-12-02 — End: ?
  Filled 2013-12-02: qty 1

## 2013-12-02 MED ORDER — OXYCODONE-ACETAMINOPHEN 5-325 MG PO TABS
1.0000 | ORAL_TABLET | ORAL | Status: DC | PRN
Start: 2013-12-02 — End: 2013-12-03

## 2013-12-02 SURGICAL SUPPLY — 94 items
ADHESIVE SKIN SURGISEAL .35ML (Suture) IMPLANT
APPLCATOR CHLORAPREP 26ML (Prep) ×6 IMPLANT
BASEPLATE TIB 5 RESTORIS STRL ONLAY (Base) ×1 IMPLANT
BASEPLATE TIBIAL 5 KNEE RIGHT MEDIAL LEFT LATERAL ONLAY (Base) ×1 IMPLANT
BASEPLATE TIBIAL 5 KNEE RT MEDIAL LT (Base) ×1 IMPLANT
BATTERY SRG DRVR LF (Other)
BATTERY SURGICAL DRIVER INSTRUMENT NA (Other) IMPLANT
BLADE SAW SAGITTAL L90 MM X W25 MM X (Blade)
BLADE SAW SAGITTAL L90 MM X W25 MM X H1.27 MM (Blade) IMPLANT
BLADE SW 90X25X1.27MM LF STRL SGTL (Blade)
BONE PIN SELF DRILL 4X140MM (Guide Pin) ×4 IMPLANT
BURR FLUTE BALL OD6 MM SURGICAL ANSPACH (Burr) ×1 IMPLANT
BURR SRG FLUT BALL ANSPACH 6MM (Burr) ×1
CEMENT BN TBR SMPX P STRL FD RADOPQ (Cement) ×1 IMPLANT
CEMENT BONE RADIOPAQUE FULL DOSE SIMPLEX (Cement) ×1 IMPLANT
CEMENT BONE RADIOPAQUE FULL DOSE SIMPLEX P TOBRAMYCIN (Cement) ×1 IMPLANT
CLIP IRR MAKO 26.7CM (Clips) ×1
CLIP IRRIGATION L26.7 CM MAKO (Clips) ×1 IMPLANT
COMPONENT FEM 5 RESTORIS STRL KN RT MDL (Component) ×1 IMPLANT
COMPONENT FEMORAL 5 KNEE RIGHT MEDIAL LEFT LATERAL RESTORIS MCK SYSTEM (Component) ×1 IMPLANT
COMPONENT FEMORAL 5 KNEE RT MEDIAL LT (Component) ×1 IMPLANT
CUFF TOURNIQUET CYLINDRICAL L30 IN X W4 IN 2 PORT 1 BLADDER QUICK (Procedure Accessories) IMPLANT
CUFF TRNQT CYL CLR CUF 30X4IN LF STRL 2 (Procedure Accessories)
CURETTE BN PLS QUIK-USE (Procedure Accessories) ×1
CURETTE QUIK-USE BONE PLASTIC (Procedure Accessories) ×1 IMPLANT
DRAPE SHEET ASTOUND XL (Drape) ×2 IMPLANT
DRAPE SRG PLS U STRDRP 51X47IN LF STRL (Drape) ×1
DRAPE SURGICAL ADHESIVE L51 IN X W47 IN (Drape) ×1
DRAPE SURGICAL ADHESIVE L51 IN X W47 IN STERI-DRAPE CLEAR (Drape) ×1 IMPLANT
DRESSING GRMCDL HDRFB IONIC SLVR PU AQCL (Dressing) ×2 IMPLANT
GLOVE SURG BIOGEL ORTHO SZ8 (Glove) ×2 IMPLANT
GLOVE SURG SUPER-SENSER SZ8 (Glove) ×6 IMPLANT
GOWN SURG MICROCOOL STRL LG (Gown) ×2 IMPLANT
HANDPIECE INTERPLUSE HIGH FLOW (Cautery) ×2 IMPLANT
HOOD T4 STERI-SHIELD (Personal Protection) ×6 IMPLANT
INSERT TIB 5 RESTORIS 8MM LF STRL ONLAY (Component) ×1 IMPLANT
INSERT TIBIAL 5 RESTORIS H8 MM KNEE (Component) ×1 IMPLANT
INSERT TIBIAL 5 RESTORIS H8 MM KNEE ONLAY MULTICOMPARTMENTAL (Component) ×1 IMPLANT
KIT DRAPE 1 PIECE POCKET RIO RIO (Drape) ×1 IMPLANT
KIT DRP RIO 1 PC PCKT RIO (Drape) ×1
KIT TOTAL KNEE DR AKHTAR (Pack) ×2 IMPLANT
MANIFOLD NEPTUNE II 4 PORT (Procedure Accessories) ×2 IMPLANT
MARKER SRG CKPNT LF STRL KT DISP (Kits) ×1
MARKER SURGICAL CHECKPOINT KIT (Kits) ×1 IMPLANT
MIXER CEMENT BONE MIXEVAC3 (Ortho Supply) IMPLANT
NEEDLE INJ SFTY 22GX1.5IN (Needles) ×2 IMPLANT
PAD ELECTROSRG GRND REM W CRD (Procedure Accessories) ×2 IMPLANT
PAD LEG ARMBOARD POSITIONING DE MAYO (Bandage) ×1 IMPLANT
PAD PSTN DE MAYO STRL LG (Bandage) ×1
PIN FIXATION OD3.18 MM PRELOAD L90 MM (Procedure Accessories)
PIN FIXATION OD3.18 MM PRELOAD L90 MM PINABALL (Procedure Accessories) IMPLANT
PIN FX PINABALL 3.18MM 90MM STRL PRELD (Procedure Accessories)
SHEET SPLIT (Drape) ×2 IMPLANT
SHEET TIBURON LAP TRANS 77X122 (Drape) ×2 IMPLANT
SLEEVE CMPR NYL MED THG LGTH SCD EXP LF (Sleeve) ×2
SLEEVE COMPRESSION NYLON MEDIUM THIGH LENGTH KENDALL ADJUSTABLE (Sleeve) ×1 IMPLANT
SOL H2O STERILE POUR 1500ML (Irrigation Solutions) ×2 IMPLANT
SOLUTION IRR 0.9% NACL 3L ARTHMTC LF (Irrigation Solutions) ×1
SOLUTION IRRIGATION 0.9% SODIUM CHLORIDE (Irrigation Solutions) ×1 IMPLANT
SOLUTION IV 0.9% NACL 1000ML VFLX LF PLS (IV Solutions) ×1
SOLUTION IV 0.9% SODIUM CHLORIDE PVC (IV Solutions) ×1
SOLUTION IV 0.9% SODIUM CHLORIDE PVC 1000 ML PH 5 PLASTIC CONTAINER (IV Solutions) ×1 IMPLANT
SOLUTION PRP 4% CHG 4OZ SCR CR EXDN ANMC (Prep) ×2 IMPLANT
SPONGE GAUZE L4 IN X W4 IN 16 PLY (Sponge) ×1
SPONGE GAUZE L4 IN X W4 IN 16 PLY MAXIMUM ABSORBENT TRAY CURITY (Sponge) ×1 IMPLANT
SPONGE GZE PLS CTTN CRTY 4X4IN LF STRL (Sponge) ×1
SPONGE LAP 18X18IN PREWASH WHT (Sponge) ×2
SPONGE LAPAROTOMY L18 IN X W18 IN (Sponge) ×2
SPONGE LAPAROTOMY L18 IN X W18 IN PREWASH WHITE (Sponge) ×2 IMPLANT
STOCKING CMPR NYL MED REG THG LGTH TED (Patient Supply) ×2
STOCKING COMPRESSION MEDIUM REGULAR THIGH LENGTH 2 PLY HEEL POCKET TED (Patient Supply) ×1 IMPLANT
SUTURE ABS 1 CTX PDS2 36IN MFL VIOL (Suture) ×1
SUTURE ABS 3-0 PS1 MNCRL MTPS 27IN MFL (Suture) ×1
SUTURE MONOCRYL 3-0 PS-1 L27 IN (Suture) ×1
SUTURE MONOCRYL 3-0 PS-1 L27 IN MONOFILAMENT UNDYED ABSORBABLE (Suture) ×1 IMPLANT
SUTURE PDS II 1 CTX L36 IN MONOFILAMENT (Suture) ×1
SUTURE PDS II 1 CTX L36 IN MONOFILAMENT VIOLET ABSORBABLE (Suture) ×1 IMPLANT
SUTURE PDS II 1 TP1 48" (Suture) ×2 IMPLANT
SUTURE VICRYL 1 CTX 36IN (Suture) ×2 IMPLANT
SUTURE VICRYL 2-0 CT1 36IN (Suture) ×4 IMPLANT
SYRINGE IRR 70CC TM LF STRL LL ADPR TIP (Syringes, Needles) IMPLANT
SYRINGE IRRIG TOOMEY STRL 70CC (Syringes, Needles)
SYRINGE LEUR LOK TIP 30 ML (Syringes, Needles) ×2 IMPLANT
SYSTEM NAVIGATION KNEE TRACK KIT (Kits) ×1
SYSTEM NAVIGATION KNEE TRACK KIT VIZADISC (Kits) ×1 IMPLANT
SYSTEM NVG VIZADISC LF STRL KN TRK KT (Kits) ×1
TOURNIQUET 34X4 PURPLE (Procedure Accessories) ×2 IMPLANT
TOURNIQUET 44X4 BLUE (Procedure Accessories) IMPLANT
TRAY LUBRISIL IC U/M FOLEY 16 (Tray) IMPLANT
TRAY SKIN SCRUB L8 IN 6 WING 6 SPONGE STICK 2 TIP APPLICATOR DRY VINYL (Prep) ×1 IMPLANT
TRAY SKIN SCRUB MEDLINE L8 IN VINYL (Prep) ×1
TRAY SKN SCRB VNYL CTTN 8IN LF STRL 6 (Prep) ×1
TUBING ANSPCH HI FLO IRR EMAX2 (Tubing) ×2
TUBING IRRIGATION WITH CLIP HIGH FLOW STERILE ANSPACH EMAX IRRTUBEHF (Tubing) ×1 IMPLANT

## 2013-12-02 NOTE — Progress Notes (Signed)
MEDICINE PROGRESS NOTE    Date Time: 12/02/2013 7:01 PM  Patient Name: Justin Cisneros  Attending Physician: Laqueta Due, MD    Subjective:     Interval History/24 hour events: Patient underwent right unicompartmental knee replacement with robotic navigation.   Right peri-patella neuroplasty earlier today without complication with Dr.Akhtar. He is quite pleased with his progress thus far. Walked a lap in halls this evening with PT.No CP,or SOB. Urinating without difficulty. Non c/o voiced.    Nursing Notes:     No notes on file    Physical Exam:     Filed Vitals:    12/02/13 1441 12/02/13 1540 12/02/13 1651 12/02/13 1754   BP: 153/77 148/69 135/69 121/72   Pulse: 61 70 74 69   Temp: 97.3 F (36.3 C) 97.2 F (36.2 C) 98.4 F (36.9 C) 98.2 F (36.8 C)   TempSrc: Temporal Artery Temporal Artery Oral Oral   Resp: 16 18 18 18    Height:       Weight:       SpO2: 97% 94% 99% 95%       Intake/Output Summary (Last 24 hours) at 12/02/13 1901  Last data filed at 12/02/13 1025   Gross per 24 hour   Intake   1700 ml   Output      0 ml   Net   1700 ml      PE   Patient alert smiling and moving right leg easily in chair. Right leg is elevated. No significant swelling or tenderness.  HEENT WNL  Neck NO JVD,  FROM, no tenderness  Lungs  Clear to A&P  Heart RRR  Pacemaker on left chest ant wall  Abd Soft NT nl BS no organomegaly  Extremities withou significant edema. Right knee wrapped in ace and stable    Meds:      Scheduled Meds: PRN Meds:         [COMPLETED] buffered lidocaine 0.3 mL Intradermal Once   ceFAZolin 1 g Intravenous Q8H   [EXPIRED] ceFAZolin      [COMPLETED] ceFAZolin 2 g Intravenous Once   [COMPLETED] celecoxib 200 mg Oral Once   docusate sodium 100 mg Oral BID   doxazosin 4 mg Oral QHS   [START ON 12/03/2013] Ferrous Sulfate 324 mg Oral QAM W/BREAKFAST   losartan 100 mg Oral Daily   And      hydrochlorothiazide 12.5 mg Oral Daily   HYDROmorphone      oxyCODONE 10 mg Oral Q12H SCH   [COMPLETED] oxyCODONE 20 mg  Oral Once   [COMPLETED] pregabalin 100 mg Oral Once   [START ON 12/03/2013] rivaroxaban 10 mg Oral QAM   simvastatin 40 mg Oral QHS   traZODone 100 mg Oral QHS   vitamin C 500 mg Oral Daily   [DISCONTINUED] albuterol 2.5 mg Nebulization Once   [DISCONTINUED] hydrALAZINE 5 mg Intravenous Once   [DISCONTINUED] NON-FORMULARY order form 100 mg Oral Daily       Continuous Infusions:       . sodium chloride 100 mL/hr at 12/02/13 1501   . [DISCONTINUED] lactated ringers 20 mL/hr (12/02/13 1407)         diphenhydrAMINE 25 mg Q12H PRN   HYDROmorphone 0.5 mg Q1H PRN   magnesium hydroxide 10 mL QD PRN   ondansetron 4 mg Q8H PRN   oxyCODONE-acetaminophen 1 tablet Q3H PRN   oxyCODONE-acetaminophen 2 tablet Q4H PRN   [DISCONTINUED] bupivacaine-EPINEPHrine (PF)  PRN   [DISCONTINUED] diphenhydrAMINE 12.5 mg Q3H  PRN   [DISCONTINUED] HYDROmorphone 0.5 mg Q5 Min PRN   [DISCONTINUED] ketorolac  PRN   [DISCONTINUED] meperidine 12.5 mg Q5 Min PRN   [DISCONTINUED] morphine 2 mg Q5 Min PRN   [DISCONTINUED] morphine  PRN   [DISCONTINUED] ondansetron 4 mg Once PRN   [DISCONTINUED] oxyCODONE-acetaminophen 1 tablet Once PRN   [DISCONTINUED] promethazine 6.25 mg Once PRN           Labs and Imaging:       Lab 12/02/13 1131   WBC --   HGB 12.0*   HCT 37.4*   PLT --     No results found for this basename: NA,K,CL,CO2,BUN,CREAT,CA,ALB,PROT,BILITOTAL,ALKPHOS,ALT,AST,GLU in the last 168 hours  No results found for this basename: PT:3,INR:3,PTT:3 in the last 168 hours    Radiology Results (24 Hour)     Procedure Component Value Units Date/Time    XR Knee Right AP And Lateral [[id number]] Collected:12/02/13 1253    Order Status:Completed  Updated:12/02/13 1430    Narrative:    Clinical History: Postop right knee surgery.    Findings: AP and crosstable lateral views of the right knee. Compared  with 06/09/2013. Prosthesis along the medial compartment of the right  knee. Overlying soft tissue gas compatible with recent postoperative  state. Small  patellofemoral compartment osteophytes. No fracture or  dislocation is identified.       Impression:    Impression: Status post right knee MAKOplasty surgery.    Darra Lis, MD   12/02/2013 2:26 PM    US GUIDED NERVE BLOCK FOR ANESTHESIA [[id number]] Resulted:12/02/13 0717    Order Status:Completed  Updated:12/02/13 7829    Narrative:    An ultrasound-guided nerve block was performed. For details regarding this   procedure please refer to the anesthesia record.            Assessment and Plan   Patient Active Hospital Problem List:  Arthritis (12/02/2013)    POA: Yes    Assessment: S/P Right unicompartmental knee replacement with robotic navigation.   Right peri-patella neuroplasty earlier today without complication    Plan: mobilized patient in halls this evening with PT. Xarelto for DVT prophylaxis  HTN (hypertension) (12/02/2013)    POA: Yes    Assessment: controlled with losartan and HCTZ restarted    Plan: monitor  Hyperlipemia (12/02/2013)    POA: Yes    Assessment: stable on Zocor    Plan: continue med  S/P placement of cardiac pacemaker (12/02/2013)    POA: Not Applicable    Assessment: good function    Plan: no tele required  OSA on CPAP (12/02/2013)    POA: Yes    Assessment: continue BiPAP while here. May be worse with post-op narcotic requirements    Plan: continue therapy  LBBB (left bundle branch block) (12/02/2013)    POA: Yes    Assessment: chronic          Safety Checklist        Status   Lines continued   Foley Catheter not applicable   Telemetry not applicable   IV fluids continued   Daily labs continued     Ordered   PT/OT/Speech yes   VTE proph yes      Done   Meds d/c'd or dose adjusted yes            Anticipated discharge disposition and date: as per Dr.Akhtar    Signed by: Berton Lan, MD

## 2013-12-02 NOTE — PT Eval Note (Addendum)
[facility]  [address]  [place], [place]. [address]    Department of Rehabilitation  [phone]    Physical Therapy Evaluation    Patient: Justin Cisneros    MRN#: [id number]     Time of treatment: Time Calculation  PT Received On: 12/02/13  Start Time: 1441  Stop Time: 1540  Time Calculation (min): 59 min    PT Visit Number: 1    Consult received for Justin Cisneros for PT Evaluation and Treatment.  Patient's medical condition is appropriate for Physical therapy intervention at this time.  Patient did attend [employer] and his coach is present.    Assessment: Justin Cisneros is a 73 y.o. male admitted 12/02/2013 presenting with excellent functional mobility tolerance for day of surgery.     Impairments: Assessment: Gait impairment;Decreased balance;Decreased functional mobility;Decreased endurance/activity tolerance;Decreased safety/judgement during functional mobility;Decreased LE strength.     Therapy Diagnosis: PT aftercare for s/p right Makoplasty.    Rehabilitation Potential: Prognosis: Good (for goals)    Plan: Treatment/Interventions: Exercise;Gait training;Neuromuscular re-education;Functional transfer training;LE strengthening/ROM;Endurance training;Bed mobility;Equipment eval/education;Patient/family training (stairs) PT Frequency: twice a day    Risks/Benefits/POC Discussed with Pt/Family: With patient/family      Goals:   Goal Formulation: With patient/family  Time for Goal Achievement: By time of discharge  Goals: Select goal  Pt Will Go Supine To Sit: Independently;to maximize functional mobility and independence;by time of discharge  Pt Will Transfer Bed/Chair: Independently;to maximize functional mobility and independence;by time of discharge  Pt Will Ambulate: > 200 feet;with rolling walker;With stand by assist;to maximize functional mobility and independence;by time of discharge (to simulate household and walkway distances)  Pt Will Go Up / Down Stairs: 1 flight;With stand by assist;to  maximize functional mobility and independence;by time of discharge (to simulate steps in home)  Pt Will Perform Home Exer Program: Independently;to maximize functional mobility and independence;by time of discharge  Pt Will Increase ROM: By 10 degrees;to maximize functional mobility and independence;by time of discharge (for R knee)    Discharge Recommendations: Discharge Recommendation: Home with home health PT       Precautions and Contraindications:   Precautions  Weight Bearing Status: RLE WBAT  Total Knee Replacement: nerve block;knee immobilizer (until quads are active)    Medical Diagnosis: 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 male admitted on 12/02/2013 for a right unicompartmental knee replacement with robotic navigation performed by Dr. Gilmore Laroche.      Patient Active Problem List   Diagnosis   . Arthritis        Past Medical/Surgical History:  Past Medical History   Diagnosis Date   . Hypertensive disorder    . Sick sinus syndrome FEB 2005     PACEMAKER PLACED 2005, REPLACED 2014   . ICD (implantable cardioverter-defibrillator) in place      MEDTRONIC DUAL CHAMBER   . Sleep apnea      USES BIPAP   . Syncope and collapse 2005     PRIOR TO PACEMAKER INSERTION   . Arthritis      RT KNEE   . Fracture of unspecified bones 1970'S     RT FT-NO SX      Past Surgical History   Procedure Date   . Vasectomy 1970'S   . Cardiac pacemaker placement      MEDTRONIC DUAL CHAMBER  PLACED 2005, REPLACED OCT 2014   . Colonoscopy 2012   . Tonsillectomy AGE 26   . Arthroplasty, knee, unicondylar prosthesis, makoplasty 12/02/2013     Procedure: ARTHROPLASTY, KNEE, UNICONDYLAR PROSTHESIS, MAKOPLASTY;  Surgeon: Laqueta Due, MD;  Location: [facility];  Service: Orthopedics;  Laterality: Right;  RIGHT ARTHROPLASTY, KNEE, UNICONDYLAR PROSTHESIS, MAKOPLASTY        Social  History:  Prior Level of Function  Prior level of function: Ambulates with assistive device  Assistive Device: Single point cane  Baseline Activity Level: Community ambulation;Household ambulation  Driving: independent  Cooking: Yes  Employment: Retired  DME Currently at [employer]: [employer] walker;Single point cane  Home Living Arrangements  Living Arrangements: Spouse/significant other  Type of Home: House  Home Layout: Other;Stairs to enter without rails (add number in comment);Bed/bath upstairs;Multi-level (2 STE)  Bathroom Shower/Tub: Pension scheme manager: Production assistant, radio  DME Currently at Home: [employer] walker;Single point cane    Subjective: Patient is agreeable to participation in the therapy session.   Patient Goal  Patient Goal: to go home  Pain Assessment  Pain Assessment: Numeric Scale (0-10)  Pain Score: 5-moderate pain  Pain Location: Knee  Pain Orientation: Right    Objective:Patient was instructed in the following functional, neuromuscular and therapeutic activities:   Observation of Patient/Vital Signs:  Patient is in bed with dressings, peripheral IV and knee immobilizer in place.     Cognition  Arousal/Alertness: Appropriate responses to stimuli  Attention Span: Appears intact  Orientation Level: Oriented X4  Memory: Appears intact  Following Commands: Follows all commands and directions without difficulty  Safety Awareness: minimal verbal instruction  Insights: Educated in safety awareness  Problem Solving: Able to problem solve independently  Neuro Status  Behavior: calm cooperative  Motor Planning: intact  Cognitive Deficit(s):  (WFL)  Hand Dominance: right handed    Gross ROM  Right Lower Extremity ROM:  (AAROM knee to 108 degrees, otherwise WFL)  Left Lower Extremity ROM: within functional limits  Gross Strength  Right Lower Extremity Strength:  (LAQ and SLR with min quad lag, otherwise 4/5)  Left Lower Extremity Strength: 5/5  Tone  Tone: within  functional limits    Functional Mobility  Supine to Sit: Stand by assistance  Scooting to [facility]: Stand by assistance  Scooting to EOB: Stand by assistance  Sit to Stand: Stand by assistance  Stand to Sit: Stand by assistance     Locomotion  Ambulation: stand by assistance;with front-wheeled walker  Ambulation Distance (Feet): 230 Feet  Pattern: R decreased stance time;decreased cadence;decreased step length   with verbal instruction for step to sequencing to include correct RW placement with advancement of affected LE followed by less affected LE and proper use of both arms to help compensate for LE weakness  Verbal instruction provided for all above functional mobility with facilitation of correct postural alignment by therapist to facilitate safe technique.    Balance  Sitting - Static: Good  Sitting - Dynamic: Good  Standing - Static: Good  Standing - Dynamic: Good (with RW)    Participation and Endurance  Participation Effort: excellent  Endurance:  (good for day of Sx)    Treatment Activities: Patient was instructed in therapeutic activities per above for out of bed and gait training as well as seated AAROM using an active assistive roller board to 108 degrees of right knee flexion. Patient also instructed in continuous ankle pumps, quad sets with knee extension to  facilitate right quad strength, standing glut sets and quad sets with Theraband behind thigh, long arc quads and bilateral straight leg raises (with 5 second hold per rep 2 X 10 reps) for LE strengthening with focus and facilitation on correct LE positioning and cadence to maximize quality of each exercise.    Educated the patient to role of physical therapy, plan of care, goals of therapy and HEP, safety with mobility and ADLs, energy conservation techniques, pursed lip breathing, weight bearing precautions.    Ina Kick PT, DPT, MS CEAS CCCE  [facility]  Physical Medicine and Rehabilitation Dept  Pager # [phone]

## 2013-12-02 NOTE — Progress Notes (Signed)
Pt tolerated regular diet for dinner. No n/v. Pain 5/10. No issues voiding. VSS. Neuro checks WNL.

## 2013-12-02 NOTE — Op Note (Signed)
MEDIAL UNICOMPARTMENTAL KNEE REPLACEMENT OPERATIVE NOTE    Preoperative Diagnosis:   Right knee medial compartment osteoarthrosis.     Postoperative Diagnosis:   Right knee medial compartment osteoarthrosis.     Title of Procedure:   Right unicompartmental knee replacement with robotic navigation.   Right peri-patella neuroplasty    Assistant:   Trott    Complications:   None.    Anesthesia:   General    Estimated Blood Loss:   50 cc    Implants:     Implant Name Type Inv. Item Serial No. Manufacturer Lot No. LRB No. Used Action   CEMENT SIMPLX P TOBRA 1 PAK - [id number] Cement CEMENT SIMPLX P TOBRA 1 PAK  [employer] [id number] Right 1 Implanted   COMPONENT FEMORAL  SZ5 RM/LL - [id number] Component COMPONENT FEMORAL  SZ5 RM/LL   [id number] Right 1 Implanted   BASEPLATE ONLAY TIBIAL SZ 5 - [id number] Base BASEPLATE ONLAY TIBIAL SZ 5   [id number] Right 1 Implanted   INSERT TIBIAL SZ 5 - [id number] Component INSERT TIBIAL SZ 5   [employer] [id number] Right 1 Implanted             History:     The patient is a 73 y.o. y.o. 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 The patient has failed conservative intervention in the form of anti-inflammatories, physical therapy, and quadriceps strengthening and assistive devices and wishes to go along with a medial knee replacement, if there is more extensive degenerative chances, she is ok with a total knee. Complications and risks are including but not limited to the risks of DVT, PE, MI, the risk of revision surgery, loosening of components fracture of the femur, infection, stiffness, and neurovascular injury, pin site complications and the risks of anesthesia. The patient understands the risks and wishes to proceed with surgery.     Description of Procedure:     Right knee was identified and marked in the preoperative suite. All questions were answered. The patient taken back to the operating room where general anesthesia was obtained after he  transferred onto the operating table. Right lower extremity was prepped and draped in the usual sterile fashion after nonsterile tourniquet was applied. Time out was taken to identify the patient, site of surgery, implants in the room, and antibiotics given prior to procedure. Knee was flexed. An incision was made in the anterior aspect of the knee after injection of 5 cc of morphine, epi in the skin site. Electrocautery was used to maintain hemostasis. The quadriceps tendon was visualized and a medial parapatellar arthrotomy was made with a new deep knife. The knee was extended and proximal medial soft tissue was elevated off the tibial plateau. The patient's knee was flexed with the patella everted and the knee was evaluated.  There was a large area of denuded bone on the medial femoral condyle but the rest of the knee had minimal changes.  We then performed a neuroplasty of the patella because of the mild degenerative changes noted.  We then opted to perform a medial uni.   The knee was registered in standard fashion with the robot after the tibial and femoral pins check points were placed.  The pins were placed unicortically with the distal tip of the pin touching the far cortex in both the tibia and femur.  We removed osteophytes.  We then reviewed our pre-operative plan and added cartilage point data as well as live dynamic tracking/balancing.  We adjusted  the components accordingly.  We then locked in the plan and burred the tibia first, then the femur.  We removed excess cartilage and bone.  We then trialed the components (listed above) femur, tibia, and poly tray and has excellent range of motion with full extension, stability in mid-flexion and in-flexion.  The knee was irrigated and dried, 32 mL of Marcaine, morphine, and Toradol were placed into the back of the posterior capsule and surrounding subcutaneous tissues, making sure to aspirate and it was not in the vascular structures. We then prepared the  bone cement and elevated the tourniquette for the cementing portion of the case. The tibia was cemented in standard fashion, removed excess cement. The femur was cemented in standard fashion removing excess cement, and then the tibial tray was placed and impacted in position. The knee was then extended. Excess cement was removed.  Once the cement hardened, we let down the tourniquet, we used electrocautery to maintain hemostasis. We removed all the pins and trackers.  We irrigated the knee and the pin sites profusely, closed the knee with interrupted figure-of-eight PDS #1 sutures at various points and then oversewed this with PDS double loop. We then repaired the subcutaneous tissue with subcutaneous inverted Vicryl 2-0 sutures and then closed the skin with Monocryl. We washed and dried the wound, placed Mastisol, Steri-Strips with Adaptic, 4 x 4s, ABD, and Webril as well as Ace wrap. All counts were correct. The patient will be weightbearing as tolerated after surgery.

## 2013-12-02 NOTE — H&P (Signed)
Subjective:  Patient is admitted for partial versus total knee arthroplasty.    Patient is a 73 y.o. male presented with a history of pain in the right knee. Onset of symptoms was gradual starting a few years ago with gradually worsening course since that time. Patient has been treated conservatively with over-the-counter NSAIDs and activity modification.     There are no active problems to display for this patient.    Past Medical History   Diagnosis Date   . Hypertensive disorder    . Sick sinus syndrome FEB 2005     PACEMAKER PLACED 2005, REPLACED 2014   . ICD (implantable cardioverter-defibrillator) in place      MEDTRONIC DUAL CHAMBER   . Sleep apnea      USES BIPAP   . Syncope and collapse 2005     PRIOR TO PACEMAKER INSERTION   . Arthritis      RT KNEE   . Fracture of unspecified bones 1970'S     RT FT-NO SX      Past Surgical History   Procedure Date   . Vasectomy 1970'S   . Cardiac pacemaker placement      MEDTRONIC DUAL CHAMBER PLACED 2005, REPLACED OCT 2014   . Colonoscopy 2012   . Tonsillectomy AGE 44      Prescriptions prior to admission   Medication Sig Dispense Refill   . aspirin 81 MG tablet Take 81 mg by mouth daily.       . diphenhydrAMINE (BENADRYL) 25 MG tablet Take 25 mg by mouth as needed. OCC TAKES AT NITE       . doxazosin (CARDURA) 4 MG tablet Take 4 mg by mouth nightly.       Marland Kitchen losartan-hydrochlorothiazide (HYZAAR) 100-12.5 MG per tablet Take 1 tablet by mouth every evening.        . Pseudoeph-Doxylamine-DM-APAP (NYQUIL PO) Take by mouth.       . simvastatin (ZOCOR) 40 MG tablet Take 40 mg by mouth nightly.        . traZODONE (DESYREL) 50 MG tablet Take 100 mg by mouth nightly.        Marland Kitchen UNABLE TO FIND Apply topically as needed. ANTIINFLAMMATORY CREAM-DICLOFENAC 5%, BACLOFEN 2% MUSCLE RELAXOR, BUPIVACAINE 1%, PENTOXYFYLLINE-ANTIINFLAMMATORY,       . Multiple Vitamins-Minerals (MULTIVITAMIN PO) Take by mouth.         No Known Allergies   History   Substance Use Topics   . Smoking status:  Former Smoker -- 15 years     Quit date: 11/28/2003   . Smokeless tobacco: Not on file      Comment: OCC SMOKED  A CIGAR OR PIPE   . Alcohol Use: 12.6 oz/week     14 Glasses of wine, 7 Shots of liquor per week      History reviewed. No pertinent family history.   Review of Systems  Pertinent items are noted in HPI.    Objective:  Vital signs in last 24 hours:  Temp:  [97.7 F (36.5 C)] 97.7 F (36.5 C)  Heart Rate:  [65] 65   Resp Rate:  [16] 16   BP: (168)/(86) 168/86 mmHg    NVI/SILT RLE. Skin intact.  Pain medially only.    Imaging Review  Plain radiographs demonstrate severe degenerative joint disease of the right knee. The overall alignment is varus. The bone quality appears to be good for age and reported activity level.    Assessment/Plan:  Medial arthritis, right knee  The patient history, physical examination and imaging studies are consistent with degenerative joint disease of the medial right knee. The treatment options including medical management, injection therapy arthroscopy and partial versus total arthroplasty were discussed at length. The risks and benefits of total knee arthroplasty were presented and reviewed. The risks due to aseptic loosening, infection, stiffness, patella tracking problems, thromboembolic complications, neurovascular injury, risks of anesthesia among others were discussed. The patient acknowledged the explanation, agreed to proceed with the plan and a consent was signed.

## 2013-12-02 NOTE — Anesthesia Postprocedure Evaluation (Signed)
Anesthesia Post-op Evaluation Note    Please refer to Transfer of Care Note for documentation of immediate postanesthetic state, including vital signs, pain control, mental status, assessment of nausea/vomiting, and assessment of hydration status.    The patient's respirations, and cardiovascular status have been evaluated and deemed stable post op. There were no obvious anesthetic related complications.    Depaul Arizpe, MD

## 2013-12-02 NOTE — Transfer of Care (Signed)
Anesthesia Transfer of Care Note    Patient: Justin Cisneros    Procedures performed: Procedure(s) with comments:  ARTHROPLASTY, KNEE, UNICONDYLAR PROSTHESIS, MAKOPLASTY - RIGHT ARTHROPLASTY, KNEE, UNICONDYLAR PROSTHESIS, MAKOPLASTY  VS TOTAL KNEE REPLACEMENT    Anesthesia type: General LMA    Patient location:Phase I PACU    Last vitals:   Filed Vitals:    12/02/13 1035   BP: 137/62   Pulse: 65   Temp: 98.4 F (36.9 C)   Resp: 16   SpO2: 95%       Post pain: Patient not complaining of pain, continue current therapy      Mental Status:awake    Respiratory Function: tolerating nasal cannula    Cardiovascular: stable    Nausea/Vomiting: patient not complaining of nausea or vomiting    Hydration Status: adequate    Post assessment: no apparent anesthetic complications

## 2013-12-02 NOTE — Brief Op Note (Signed)
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 Item Serial No. Manufacturer Lot No. LRB No. Used Action   CEMENT SIMPLX P TOBRA 1 PAK - [id number] Cement CEMENT SIMPLX P TOBRA 1 PAK  [employer] [id number] Right 1 Implanted   COMPONENT FEMORAL  SZ5 RM/LL - [id number] Component COMPONENT FEMORAL  SZ5 RM/LL   [id number] Right 1 Implanted   BASEPLATE ONLAY TIBIAL SZ 5 - [id number] Base BASEPLATE ONLAY TIBIAL SZ 5   [id number] Right 1 Implanted   INSERT TIBIAL SZ 5 - [id number] Component INSERT TIBIAL SZ 5   [employer] [id number] Right 1 Implanted       Drains:   Drains: no    Specimens:       Findings:   O/A medial compartment.  Grade 1 change in 39mmx6mm region of femoral trochlear groove.    Complications:   none      Signed by: Laqueta Due, MD                                                                           [facility]

## 2013-12-02 NOTE — Anesthesia Procedure Notes (Signed)
Procedures    Femoral Block (dilute), Ultrasound Guided    Procedure Start Time: 08:05  Procedure End Time: 08:15    Preparation  PATRYK CONANT presents for right femoral block under ultrasound guidance. Patient identified; pre-op interview, physical exam, and anesthesia evaluation performed; informed anesthesia consent obtained; all questions answered. Patient placed in supine position. Monitors placed. Time out performed. Sedation administered (as needed) as documented in the anesthesia record. Meaningful contact maintained.    Procedure  Drug mixture comprising 30 cc of 0.2% ropivacaine and dexamethasone 4 mg prepared. Sterile technique employed including use of chlorhexidine prep, mask, and sterile gloves. Common femoral artery cross section visualized by ultrasound, in conjunction with femoral nerve bundle. In-plane needle trajectory planned. Lidocaine local anesthetic used to anesthetize skin and subcutaneous tissues anterolateral to the ultrasound probe. 22-gauge, 4-inch block needle advanced toward target nerve bundle under continuous, in-plane ultrasound visualization. Confirmation of proper needle placement assessed via ultrasound visualization. No heme on aspiration or paresthesia. Initial test injection performed without resistance or other difficulty. Ultrasound image(s) saved for medical records. 30 cc of drug mixture injected into targeted area in 5 cc increments, without heme on serial aspiration or further resistance to injection and needle removed. No complications during or immediately following the procedure; procedure well tolerated.    Verdis Prime, MD, PhD  Anesthesia and Pain Physician  12/02/2013

## 2013-12-03 LAB — HEMOGLOBIN AND HEMATOCRIT, BLOOD
Hematocrit: 33.1 % — ABNORMAL LOW (ref 42.0–52.0)
Hgb: 10.6 g/dL — ABNORMAL LOW (ref 13.0–17.0)

## 2013-12-03 NOTE — Progress Notes (Signed)
Home Health Referral          Referral from Sujatha Polina (Case Manager) for home health care upon discharge.    By [employer], the patient has the right to freely choose a home care provider.  Arrangements have been made with:     A company of the patients choosing. We have supplied the patient with a listing of providers in your area who asked to be included and participate in Medicare.   [facility], a home care agency that provides both adult home care services which is a wholly owned and operated by [employer] and participates in [employer]   The preferred provider of your insurance company. Choosing a home care provider other than your insurance company's preferred provider may affect your insurance coverage.    The Home Health Care Referral Form acknowledging the voluntary selection of the home care company has been completed, signed, and is on file.      Home Health Discharge Information     Your doctor has ordered Physical Therapy and Occupational Therapy in-home service(s) for you while you recuperate at home, to assist you in the transition from hospital to home.      The agency that you or your representative chose to provide the service:  Name of Home Health Agency: [facility] [phone]      The above services were set up by:  Julien Girt  [facility] Liaison)   Phone  [phone]                                          Additional comments: Patient states no need for Skilled nursing visits, patient's wife is a Engineer, civil (consulting).  Patient also states he owns rolling walker and shower chair, and crutches at home.      Signed by: Berton [place]-Le  Date Time: 12/03/2013 11:06 AM

## 2013-12-03 NOTE — Progress Notes (Signed)
Agree with above 

## 2013-12-03 NOTE — OT Eval Note (Signed)
[facility]  [address]  [place], [place]. [address]    Department of Rehabilitation Services  [phone]    Occupational Therapy Evaluation    Patient: Justin Cisneros    MRN#: [id number]     Time of treatment: Time Calculation  OT Received On: 12/03/13  Start Time: 1135  Stop Time: 1225  Time Calculation (min): 50 min  OT Visit Number: 1    Consult received for Justin Cisneros for OT Evaluation and Treatment.  Patient's medical condition is appropriate for Occupational therapy intervention at this time.      Assessment: Justin Cisneros is a 73 y.o. male admitted 12/02/2013 presenting with balance deficits;decreased independence with ADLs;decreased safety awareness;decreased strength    Therapy Diagnosis: Decreased independence w/ ADLs.    Rehabilitation Potential: Prognosis: Good;With family    Plan: OT Frequency Recommended: 1-2x/wk   Treatment Interventions: ADL retraining;Functional transfer training;Cognitive reorientation;Patient/Family training;Equipment eval/education;Compensatory technique education     Patient Goal  Patient Goal:  (To return home w/ spouse.)    Risks/benefits/POC discussed w/ pt.    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 male admitted on 12/02/2013 with   Title of  Procedure:    Right unicompartmental knee replacement with robotic navigation.    Right peri-patella neuroplasty  Preoperative Diagnosis:    Right knee medial compartment osteoarthrosis.   Per Dr. Gilmore Cisneros on 12/02/13.    Patient Active Problem List   Diagnosis   . Arthritis   . HTN (hypertension)   . Hyperlipemia   . S/P placement of cardiac pacemaker   . OSA on CPAP   . LBBB (left bundle branch block)        Past Medical/Surgical History:  Past Medical History   Diagnosis Date   . Hypertensive disorder    . Sick sinus syndrome FEB 2005     PACEMAKER PLACED 2005, REPLACED 2014   . ICD (implantable cardioverter-defibrillator) in place      [employer] DUAL CHAMBER   . Sleep apnea      USES BIPAP   . Syncope and collapse 2005     PRIOR TO PACEMAKER INSERTION   . Arthritis      RT KNEE   . Fracture of unspecified bones 1970'S     RT FT-NO SX      Past Surgical History   Procedure Date   . Vasectomy 1970'S   . Cardiac pacemaker placement      MEDTRONIC DUAL CHAMBER PLACED 2005, REPLACED OCT 2014   . Colonoscopy 2012   . Tonsillectomy AGE 74   . Arthroplasty, knee, unicondylar prosthesis, makoplasty 12/02/2013     Procedure: ARTHROPLASTY, KNEE, UNICONDYLAR PROSTHESIS, MAKOPLASTY;  Surgeon: Laqueta Due, MD;  Location: [facility];  Service: Orthopedics;  Laterality: Right;  RIGHT ARTHROPLASTY, KNEE, UNICONDYLAR PROSTHESIS, MAKOPLASTY          X-Rays/Tests/Labs:      Social History:  Prior Level of Function  Prior level of function: Ambulates with assistive device  Assistive Device: Single point cane  Baseline Activity Level: Community ambulation;Household ambulation  Driving: independent  Cooking: Yes  Employment: Retired  DME Currently at [employer]: [employer] walker;Single point cane  Home Living Arrangements  Living Arrangements: Spouse/significant other  Type of Home: House  Home Layout: Other;Stairs to enter without rails (add number in comment);Bed/bath upstairs;Multi-level (2 STE)  Bathroom Shower/Tub: Paramedic: Production assistant, radio  DME Currently at Home: [employer] walker;Single point cane    Subjective: Patient is agreeable to participation in the therapy session. Nursing clears patient for therapy.  Pt reports no c/o this am.  Spouse in room visiting.    Pain Assessment: Numeric Scale (0-10)  Pain Score: 3-mild pain  Pain Location: Knee  Pain Orientation: Right  Pain Intervention(s): Cold applied;Ambulation/increased activity;Elevated.        Objective:  Observation of Patient/Vital Signs:  Patient is seated in a bedside chair with dressings R knee in place.         Cognition  Arousal/Alertness: Appropriate responses to stimuli  Attention Span: Appears intact  Orientation Level: Oriented X4  Memory: Appears intact  Following Commands: independent  Safety Awareness: minimal verbal instruction  Insights: Educated in Engineer, building services  Problem Solving: Assistance required to generate solutions  Neuro Status  Behavior: attentive;calm cooperative  Motor Planning: intact  Coordination: intact  Cognitive Deficit(s):  (WFL)  Hand Dominance: right handed    Gross ROM  Right Upper Extremity ROM: within functional limits  Left Upper Extremity ROM: within functional limits  Gross Strength  Right Upper Extremity Strength: within functional limits  Left Upper Extremity Strength: within functional limits     Tone: within functional limits    Sensory  Auditory: intact  Tactile - Light Touch: intact  Visual Acuity: wears glasses       Self-care and Home Management  Eating: independently  Grooming: modified independently;standing at sink;teeth care;wash/dry hands;standing with assistive device  UE Dressing: independently  LE Dressing: independently;sitting;Don/doff R sock;Don/doff L sock;Increased time to complete;Don/doff R shoe;Don/doff L shoe  Toileting: independently  Functional Transfers: modified independently    Mobility and Transfers  Supine to Sit: Independently  Sit to Supine:  Independently  Sit to Stand: Independently  Functional Mobility/Ambulation: Stand by assistance (Pt able to ambulate from chair<--->toilet using RW for support)     Balance  Static Sitting Balance: independently  Dyanamic Sitting Balance: independently  Static Standing Balance: with supervision (using RW for support.)  Dynamic Standing Balance: with supervision (using RW for support.)    Participation and Endurance  Participation Effort: good    Treatment Activities:     Educated the patient to role of occupational therapy, plan of care, goals of therapy and HEP, safety with mobility and ADLs, energy conservation techniques, home safety.  Pt provided with verbal instructions of safe techniques to complete LB dressing and bathing.  Pt instructed in proper UE/LE placement for toilet transfers/chair transfers/bed transfers/walk in shower transfers and RW management during ambulation from bed<--->toilet/walk in shower.  Pt instructed in proper techniques to don/doff B socks/shoes and keeping LE elevated above heart level to decrease edema. Educated pt in home safety  including reducing clutter, removing throw rugs, keeping walkways/hallways free of clutter to ambulate safely w/ RW, keeping dimly lit areas well lit, and obtaining non-skid mat for tub for safety. Pt left with ice R knee, call button and phone within reach and instructed in calling nursing with any needs.

## 2013-12-03 NOTE — Progress Notes (Signed)
MEDICINE PROGRESS NOTE    Date Time: 12/03/2013 2:03 PM  Patient Name: Justin Cisneros T  Attending Physician: Laqueta Due, MD    Subjective:     Interval History/24 hour events: patient worked with PT today. Walked in halls and on steps. Pain well controlled. No dyspnea. No palpitations.he is anxious to get home. Wife buying a recliner to raise leg with. No BM since surgery.    Nursing Notes:     Tilden Dome, RN  12/03/2013 12:08 PM  Cosign Needed  POD 1 TKR.  Dr Renaldo Reel performed a femoral nerve block for post-op pain control.  Patient very pleased.   It has been 28 hours since placement of block and still no pain.  Able to rotate and move both toes and ankle without pain.  Olivia Canter, RN  12/03/2013  6:22 AM  Signed  Pt resting in bed. Pain well managed. Has ambulated 200+ft. Call bell in reach. Will continue to monitor.  Harriet Masson, RN  12/02/2013  8:00 PM  Signed  Pt tolerated regular diet for dinner. No n/v. Pain 5/10. No issues voiding. VSS. Neuro checks WNL.     Physical Exam:     Filed Vitals:    12/02/13 2045 12/03/13 0111 12/03/13 0529 12/03/13 1019   BP: 140/67 140/65 108/55 108/54   Pulse: 69 66 57 67   Temp: 96.8 F (36 C) 97.2 F (36.2 C) 96.8 F (36 C) 97.2 F (36.2 C)   TempSrc: Temporal Artery Temporal Artery Temporal Artery Temporal Artery   Resp: 16 16 16 18    Height:       Weight:       SpO2: 96% 93% 96% 96%       Intake/Output Summary (Last 24 hours) at 12/03/13 1403  Last data filed at 12/03/13 0700   Gross per 24 hour   Intake   1780 ml   Output    700 ml   Net   1080 ml      General appearance - alert, well appearing, and in no distress, oriented to person, place, and time, normal appearing weight, improved and well hydrated  Mental status - alert, oriented to person, place, and time, normal mood, behavior, speech, dress, motor activity, and thought processes  Eyes - pupils equal and reactive, extraocular eye movements intact, no palor no injection  Ears - bilateral TM's and external  ear canals normal, hearing grossly normal bilaterally  Mouth - mucous membranes moist, pharynx normal without lesions and dental hygiene good  Neck - supple, no significant adenopathy, carotids upstroke normal bilaterally, no bruits and thyroid exam: thyroid is normal in size without nodules or tenderness  Lymphatics - no palpable lymphadenopathy, no hepatosplenomegaly  Chest - clear to auscultation, no wheezes, rales or rhonchi, symmetric air entry. Pacemaker left side of chest  Heart - normal rate, regular rhythm, normal S1, S2, no murmurs, rubs, clicks or gallops  Abdomen - soft, nontender, nondistended, no masses or organomegaly  Musculoskeletal - right knee elevated with ace wrap applied. no signif edema, no signif warmth  Extremities - peripheral pulses normal, no pedal edema, no clubbing or cyanosis, no signs of DVT. Iv in right hand and ok  Skin - normal coloration and turgor, no rashes, no suspicious skin lesions noted      Meds:      Scheduled Meds: PRN Meds:         [COMPLETED] ceFAZolin 1 g Intravenous Q8H   [EXPIRED] ceFAZolin  docusate sodium 100 mg Oral BID   doxazosin 4 mg Oral QHS   Ferrous Sulfate 324 mg Oral QAM W/BREAKFAST   losartan 100 mg Oral Daily   And      hydrochlorothiazide 12.5 mg Oral Daily   oxyCODONE 10 mg Oral Q12H SCH   rivaroxaban 10 mg Oral QAM   simvastatin 40 mg Oral QHS   traZODone 100 mg Oral QHS   vitamin C 500 mg Oral Daily   [DISCONTINUED] NON-FORMULARY order form 100 mg Oral Daily       Continuous Infusions:       . sodium chloride 100 mL/hr at 12/02/13 1501   . [DISCONTINUED] lactated ringers 20 mL/hr (12/02/13 1407)         diphenhydrAMINE 25 mg Q12H PRN   HYDROmorphone 0.5 mg Q1H PRN   magnesium hydroxide 10 mL QD PRN   ondansetron 4 mg Q8H PRN   oxyCODONE-acetaminophen 1 tablet Q3H PRN   oxyCODONE-acetaminophen 2 tablet Q4H PRN   [DISCONTINUED] diphenhydrAMINE 12.5 mg Q3H PRN   [DISCONTINUED] HYDROmorphone 0.5 mg Q5 Min PRN   [DISCONTINUED] meperidine 12.5 mg Q5  Min PRN   [DISCONTINUED] morphine 2 mg Q5 Min PRN   [DISCONTINUED] ondansetron 4 mg Once PRN   [DISCONTINUED] oxyCODONE-acetaminophen 1 tablet Once PRN   [DISCONTINUED] promethazine 6.25 mg Once PRN           Labs and Imaging:       Lab 12/03/13 0737   WBC --   HGB 10.6*   HCT 33.1*   PLT --     No results found for this basename: NA,K,CL,CO2,BUN,CREAT,CA,ALB,PROT,BILITOTAL,ALKPHOS,ALT,AST,GLU in the last 168 hours  No results found for this basename: PT:3,INR:3,PTT:3 in the last 168 hours    Radiology Results (24 Hour)     Procedure Component Value Units Date/Time    XR Knee Right AP And Lateral [[id number]] Collected:12/02/13 1253    Order Status:Completed  Updated:12/02/13 1430    Narrative:    Clinical History: Postop right knee surgery.    Findings: AP and crosstable lateral views of the right knee. Compared  with 06/09/2013. Prosthesis along the medial compartment of the right  knee. Overlying soft tissue gas compatible with recent postoperative  state. Small patellofemoral compartment osteophytes. No fracture or  dislocation is identified.       Impression:    Impression: Status post right knee MAKOplasty surgery.    Darra Lis, MD   12/02/2013 2:26 PM            Assessment and Plan   Patient Active Hospital Problem List:  Arthritis (12/02/2013) POA: Yes  Assessment: S/P Right unicompartmental knee replacement with robotic navigation.   Right peri-patella neuroplasty yesterday without complication   Plan: mobilized patient in halls again today with PT. Xarelto for DVT prophylaxis. H/H with normal reduction post-op  HTN (hypertension) (12/02/2013) POA: Yes  Assessment: controlled with losartan and HCTZ restarted  Plan: monitor  Hyperlipemia (12/02/2013) POA: Yes  Assessment: stable on Zocor  Plan: continue med  S/P placement of cardiac pacemaker (12/02/2013) POA: Not Applicable  Assessment: good function  Plan: no tele required  OSA on CPAP (12/02/2013) POA: Yes  Assessment: continue BiPAP while here. May be worse  with post-op narcotic requirements  Plan: continue therapy  LBBB (left bundle branch block) (12/02/2013) POA: Yes  Assessment: chronic  Constipation will give one dose MOM today          Safety Checklist  Status   Lines continued   Foley Catheter not applicable   Telemetry not applicable   IV fluids continued   Daily labs continued     Ordered   PT/OT/Speech yes   VTE proph yes      Done   Meds d/c'd or dose adjusted yes            Anticipated discharge disposition and date: today as per ortho    Signed by: Berton Lan, MD

## 2013-12-03 NOTE — PT Progress Note (Signed)
[facility]  [address]  [place], [place]. [address]    Department of Rehabilitation  [phone]    Physical Therapy Daily Treatment Note    Patient: Justin Cisneros    MRN#: [id number]     Time of Treatment: Start Time: 1431 Stop Time: 1500 Time Calculation: 29 min  PT Visit Number: 3     POD # 1  Patient's medical condition is appropriate for Physical Therapy intervention at this time.    Right unicompartmental knee replacement with robotic navigation:  POST-OP Precautions:  Weight Bearing Status R LE WBAT    Subjective: Pt's RN Aram Beecham clears patient for therapy.  Patient is agreeable to participation in the therapy session.  Pt's spouse present for stair training.    Pain Assessment: Numeric Scale (0-10)  Pain Score: 3-mild pain  POSS Score: Awake and Alert  Pain Location: Knee  Pain Orientation: Right  Pain Descriptors: Aching  Pain Intervention(s): Repositioned;Cold applied;Ambulation/increased activity;Elevated     Coach [x]  present []  not present    Objective:  Observation of patient vital signs performed by nursing staff.   Patient is in bed with dressings and Ted hose in place.   Pt seen for functional activities and exercises as noted:   Cognition  Orientation Level: Oriented x 4  Functional Mobility  Supine to Sit and Sit to Supine: Independent  Sit to Stand and Stand to Sit: Independent   Locomotion  Ambulation: independently with front-wheeled walker, step-through, good heel/toe and pt allowing knee to flex during swing through phase  Ambulation Distance: 230 Feet  Stair Management: step to pattern with one rail and a crutch, pt practiced with crutch on R and on L to prepare for home environment, spouse able to demonstrate good safety awareness for guarding pt on stairs   Number of Stairs: 12 (3 sets of 4)    Educated the patient and spouse to role of physical therapy, plan of care, goals of therapy, HEP, safety with mobility, weight bearing precautions (pt is WBAT), and home safety.  Encouraged pt to perform LE AE there ex throughout the day to decrease effects of immobility and increase circulation. Encouraged pt to perform right knee Ther Ex 3-4 x's a day to increase strength and ROM, and to flex knee frequently; discouraged use of pillow under right knee so terminal extension can be obtained. Reviewed and completed pt education on post-op pain management, risk of constipation due to pain medication and immobility, s/s of DVT, s/s of infection.  All questions and concerns addressed. Pt and spouse receptive to all education and verbal instruction provided. Left pt reclined in bed, without needs, call bell in reach, pt prepared to be discharged home soon.     [employer] Am-PAC- Basic Mobility Inpatient (short form) score:  AM-PACT "6 Clicks" Basic Mobility Inpatient Short Form  Turning Over in Bed: None  Sitting Down On/Standing From Armchair: None  Lying on Back to Sitting on Side of Bed: None  Assist Moving to/from Bed to Chair: None  Assist to Walk in Hospital Room: None  Assist to Climb 3-5 Steps with Railing: A little  PT Basic Mobility Raw Score: 23   CMS 0-100% Score: 11.20%    Assessment:  Pt with decrease recall of gait pattern with stairs training from a.m. session, required increased instructions and review with spouse present to ensure pt safe on stairs.  Demonstrated sufficient strength to perform stairs with supervision.  Patient appears to be on track and safe  for home discharge, patient has met clinical pathway goals for gait and transfers.   Pt will benefit from Home Health PT to address the following deficits:    Assessment: Decreased right knee/LE strength and  ROM and Gait impairment  Prognosis: Good with continued PT status post acute discharge   Progress: Progressing toward goals   Patient Goal: to go home    Plan:  Discharge acute PT at [facility] with anticipated discharge home today.  If pt is not discharged will continue with Physical Therapy services to address post-op  MAKO ROM and strength deficits.      PT Frequency: twice a day M-F as appropriate, once daily on weekend     Discharge Recommendation: Home with home health PT

## 2013-12-03 NOTE — Progress Notes (Signed)
POD 1 TKR.  Dr Renaldo Reel performed a femoral nerve block for post-op pain control.  Patient very pleased.   It has been 28 hours since placement of block and still no pain.  Able to rotate and move both toes and ankle without pain.

## 2013-12-03 NOTE — Plan of Care (Signed)
Problem: Day 1 Post-op- Knee Surgery  Goal: Pain at adequate level as identified by patient  Outcome: Progressing  Pt CFG has been met with oral medications this am.  Anticipating discharge today.  Reviewed role of long acting vs short acting narcotics.  Pt and family verbalized understanding  Goal: Stable Neurovascular Status  Outcome: Progressing  NV checks remain WDL  Goal: Mobility/activity is maintained at optimum level for patient  Outcome: Progressing  Pt has been ambulating with steady gait and appropriate use of walker.  Reinforced ankle pump exercises.  Pt eager to participate in PT and OT

## 2013-12-03 NOTE — Discharge Instructions (Addendum)
[facility]    Patient Information    Discharge Instructions  Knee Replacement Program      Patient Precautions:  You had aProcedure(s) (LRB):  ARTHROPLASTY, KNEE, UNICONDYLAR PROSTHESIS, MAKOPLASTY (Right) on 12/02/2013 with Surgeon(s):  Laqueta Due, MD    Your weight bearing precautions are weight bearing as tolerated    Please follow up with your physician in 2 weeks or as needed for immediate concerns.     Compression stockings should be worn for the first two weeks.     Showering:  You may shower at anytime. Pat dressing dry.     Incision Care/Dressing Changes:  You can change your dressing 7 days after your surgery. The hospital should provide you with the second bandage.   If your bandage that covered your incision begins to curl or opens at the edges, please call your surgeon as soon as possible for instructions.     Swelling:   It is normal to have some swelling in your lower legs after the surgery. Walking every hour and doing your exercises will help strengthen your muscles and resolve the swelling. Place ice over and around the incision for about 15 minutes to reduce swelling and pain. If the swelling increases overnight, please call your doctors office as soon as possible.     Pain:  You may continue to have pain or soreness for several weeks after your surgery. Please follow the pain medication regiment established while in the hospital until you are able to follow up with your surgeon. Stand or walk every 1-1.5 hours during the day may help reduce stiffness. Ice at incision may help with incision pain. Ice should never be left on incision longer than 15 minutes at a time.     Travel:  You should not plan to fly for about a month. Any long car trips require frequent breaks with walking and movement of the operated leg.     Do's and Don'ts    Do's:    1. Elevate the operative leg when sitting in a chair to prevent or decrease swelling  2. Lie down flat for approximately 30 minutes at least  twice a day  3. Use a pillow under your calf or ankle, NOT under your knee  4. Sit in chair that has arms so that you can push yourself up into a standing position with greater ease  5. Wear low heeled sturdy shoes  6. Use a toilet seat riser and a bath bench for comfort  7. Put rubber safety strips in the shower to prevent slipping  8. Use long handled reacher to pick up items from the floor  9. Keep incision clean and dry  10. Follow instruction for the amount of weight that you are allowed to placed on your involved side  11. Continue to do your exercises  12. Continue to use your walker or crutches  13. Continue to use your knee immobilizer until cleared by your physical therapist or physician.   14. Keep your leg in a neutral position when walking, sitting or lying in bed.   15. Tell your dentist and other health care providers that you have a joint replacement  16. Reports to your physician any of the following:  a. Incision concerns such as :  i. Increased drainage  ii. Change in color of drainage  iii. Odor of drainage  iv. Redness at incision site  v. Increased pain or burning  b. Urinary symptoms such as:  i. Frequent urination  ii. Burning sensation  iii. Strong order to urine  iv. Fever  c. Signs of possible blood clots in the legs  i. Pain in calves or groins  ii. Swelling in calve or groin  iii. Shortness of breath - Call 911  iv. Temperature/fever  v. Chest pain - Call 911    Don'ts:    1. Don't put a pillow under your knee  2. Don't drive a car until your doctor gives you permission  3. Don't sit in the bathtub until the doctor gives permission    Home Health Discharge Information   Your doctor has ordered Physical Therapy and Occupational Therapy in-home service(s) for you while you recuperate at home, to assist you in the transition from hospital to home.   The agency that you or your representative chose to provide the service:   Name of Home Health Agency: [facility] [phone]    The above services were set up by:   Julien Girt (Home Health Liaison    [facility]    Patient Information    Discharge Instructions  Knee Replacement Program      Patient Precautions:  You had aProcedure(s) (LRB):  ARTHROPLASTY, KNEE, UNICONDYLAR PROSTHESIS, MAKOPLASTY (Right) on 12/02/2013 with Surgeon(s):  Laqueta Due, MD    Your weight bearing precautions are weight bearing as tolerated    Please follow up with your physician in 2 weeks or as needed for immediate concerns.     Compression stockings should be worn for the first two weeks.     Showering:  You may shower at anytime. Pat dressing dry.     Incision Care/Dressing Changes:  You can change your dressing 7 days after your surgery. The hospital should provide you with the second bandage.   If your bandage that covered your incision begins to curl or opens at the edges, please call your surgeon as soon as possible for instructions.     Swelling:   It is normal to have some swelling in your lower legs after the surgery. Walking every hour and doing your exercises will help strengthen your muscles and resolve the swelling. Place ice over and around the incision for about 15 minutes to reduce swelling and pain. If the swelling increases overnight, please call your doctors office as soon as possible.     Pain:  You may continue to have pain or soreness for several weeks after your surgery. Please follow the pain medication regiment established while in the hospital until you are able to follow up with your surgeon. Stand or walk every 1-1.5 hours during the day may help reduce stiffness. Ice at incision may help with incision pain. Ice should never be left on incision longer than 15 minutes at a time.     Travel:  You should not plan to fly for about a month. Any long car trips require frequent breaks with walking and movement of the operated leg.     Do's and Don'ts    Do's:    17. Elevate the operative leg when sitting in a chair to  prevent or decrease swelling  18. Lie down flat for approximately 30 minutes at least twice a day  19. Use a pillow under your calf or ankle, NOT under your knee  20. Sit in chair that has arms so that you can push yourself up into a standing position with greater ease  21. Wear low heeled sturdy shoes  22. Use a toilet seat riser and a bath bench for comfort  23. Put rubber safety strips in the shower to prevent slipping  24. Use long handled reacher to pick up items from the floor  25. Keep incision clean and dry  26. Follow instruction for the amount of weight that you are allowed to placed on your involved side  27. Continue to do your exercises  28. Continue to use your walker or crutches  29. Continue to use your knee immobilizer until cleared by your physical therapist or physician.   30. Keep your leg in a neutral position when walking, sitting or lying in bed.   31. Tell your dentist and other health care providers that you have a joint replacement  32. Reports to your physician any of the following:  a. Incision concerns such as :  i. Increased drainage  ii. Change in color of drainage  iii. Odor of drainage  iv. Redness at incision site  v. Increased pain or burning  b. Urinary symptoms such as:  i. Frequent urination  ii. Burning sensation  iii. Strong order to urine  iv. Fever  c. Signs of possible blood clots in the legs  i. Pain in calves or groins  ii. Swelling in calve or groin  iii. Shortness of breath - Call 911  iv. Temperature/fever  v. Chest pain - Call 911    Don'ts:    4. Don't put a pillow under your knee  5. Don't drive a car until your doctor gives you permission  6. Don't sit in the bathtub until the doctor gives permission  7. Don't shower until the incision is healed with no drainage

## 2013-12-03 NOTE — Plan of Care (Signed)
Ask3Teach3 Program    Education about New Medications and their Side effects    Dear Justin Cisneros,    Its been a pleasure taking care of you during your hospitalization here at [facility]. We have initiated a new program to educate our patients and/or their family members or designated personnel about the new medications started by your physicians and their indications along with the possible side effects. Multiple studies have shown that patients started on new medications are often unaware of the names of the medication along with the indications and their side effects which leads to decreased compliance with the medications.    During our conversation today on 12/03/2013  1:40 AM I have explained to you the name of the new medication and the indication along with some possible common side effects. Listed below are some of the new medications started during this hospitalization.     Please call the Nurse if you have any side effects while in hospital.     Please call 911 if you have any life threatening symptoms after you are discharged from the hospital.    Please inform your Primary care physician for common side effects which are not life threatening after discharge.    Medication:Oxycodone/APAP(Percocet)   This Medication is used for:   Moderate to Severe pain    Common Side Effects are:   Drowsiness   Dizziness   Constipation   Itching    A note from your nurse:  Call your nurse immediately if you notice itching, hives, swelling or trouble breathing         Thank you for your time.    Fidela Salisbury, RN  12/03/2013  1:40 AM  [facility]  [address]  [place], [place]  [address]

## 2013-12-03 NOTE — Consults (Addendum)
Case Management Initial Discharge Planning Assessment    Psychosocial/Demographic Information   Name of interviewee: Patient and wife   Orientation and decision making abilities of patient (ie a&ox3 able to make decisions, demented pnt, pnt on vent, etc) Alert and oriented x 3   Does the patient have an Advance Directive? Location? (home/on chart, if home-advised to bring in copy?) Provided information   Healthcare Decision Maker (HDM) (if other than the patient) Include relationship and contact information.  Self   Any additional emergency contacts? Wife, Justin Cisneros [phone]   Pt lives with Wife   Type of residence where patient lives Multi level house w/ 2 steps to enter. Bedroom is upstairs.    Prior level of functioning (ambulation & ADL's) Independent w/ ambulation using a cane. (+) cooks. (+) Drives   Support system/Transportation Resources-list  (i.e. Does the patient have difficulty getting to appointments or obtaining medications?) Wife is his support system. Wife can drive him to appointments and assist him at home. Wife is a Engineer, civil (consulting) per pt.    Correct Insurance listed on face sheet - verified with the patient/HDM Yes   Source of Income (SSDI. SSI. Social Security, pension, employment, Catering manager) Retired     Economist in Place  Name of Primary Care Physician verified in patient banner (update in patient banner if not listed) Dr.Mancini   PCP Follow up apptmt offered/set up Spoke to Dr.Mancini and he said that pt doesn't need to f/u with him. Dr.Mancini saw pt today.    What DME does the patient currently own? (rolling walker, hospital bed, home O2, BiPAP/CPAP, bedside commode, cane, hoyer lift) R.W, and cane   Are PT/OT services indicated? If so, has it been ordered?  Yes   Has the patient been to an Acute Rehab or SNF in the past?  If so, where? No   Does the patient currently have home health or hospice/palliative services in place?  If so, list agency name. No   Does the patient already  have community dialysis set up?  If so, where? No     Readmission Assessment  Current LACE Score >11? Yes/No No   Is this patient an inpatient to inpatient 30 day readmission? No   If readmission, what was the previous D/C plan?  What did or didn't work with the previous d/c plan? N/A     Anticipated Discharge Plan  Discussed Anticipated Discharge Date and Discharge Disposition Possibilities with: _x__Patient   ___Healthcare Decision Maker  ___Other   Anticipated Disposition: Option A Home w/ wife and skilled home P.T/O.T services. Skilled [facility] services were arranged through [facility].    Anticipated Disposition: Option B    If applicable, were SNF or Hospice choices provided? N/A   Palliative Care Consult needed? (if yes, contact attending MD) No   Are there any potential barriers to discharge identified?      ___Lack of Insurance  ___Lack of Health Literacy  ___Undocumented  ___No resources for meds or medical care  ___Transportation issues  ___Language/Cultural/Spiritual  ___Cognitive level / capacity  ___Psychiatric or substance abuse issues  ___Co-morbidities  ___Potential abuse or neglect  ___Safety issues in the home  ___Potential placement issues  ___Pt / family disagreement with d/c plan  ___Lack of family support  ___Lack of extended family / friend support  ___Home Estate agent (multi-level home/access          issues)   _x__ NONE     Inpatient Medicare/Medicare HMO Patients Only  Was an  initial IMM signed within 24 hours of admission?  (Look in Media Tab, Documents Table or Shadow Chart) Yes     Uninsured Patients Only  If patient has a spouse, does your spouse have insurance under his/her place of employment? N/A   Did the patient sign up for insurance through the Affordable Care Act? N/A

## 2013-12-03 NOTE — Progress Notes (Signed)
Pt resting in bed. Pain well managed. Has ambulated 200+ft. Call bell in reach. Will continue to monitor.

## 2013-12-03 NOTE — Plan of Care (Signed)
Problem: Pain  Goal: Patient's pain/discomfort is manageable  Outcome: Progressing  Well managed with PRN medication

## 2013-12-03 NOTE — PT Progress Note (Signed)
[facility]  [address]  [place], [place]. [address]    Department of Rehabilitation  [phone]    Physical Therapy Daily Treatment Note    Patient: Justin Cisneros    MRN#: [id number]     Time of Treatment: Start Time: 0900 Stop Time: 0954 Time Calculation: 54 min  PT Visit Number: 2    POD # 1  Patient's medical condition is appropriate for Physical Therapy intervention at this time.  Total Knee Replacement Precautions:  Weight Bearing Status  R LE WBAT    Subjective:  Pt's RN Aram Beecham clears patient for therapy, pt was given pain med in anticipation of session.  Patient is agreeable to participation in the therapy session.  Reports pain is being well managed.    Pain Assessment: Numeric Scale (0-10)  Pain Score: 3-mild pain  POSS Score: Awake and Alert  Pain Location: Knee  Pain Orientation: Right  Pain Descriptors: Aching  Pain Intervention(s): Repositioned;Cold applied;Ambulation/increased activity;Elevated     Coach []  present [x]  not present    Objective:  Pbservation of patient vital signs performed by nursing staff.   Patient is in bed with R surgical dressings and Ted hose in place.    ROM:   Patient position- sitting.     Knee A/AAROM flexion: 114 degrees  Foot on floor   Patient position- semi-reclined   Knee A/AAROM extension: 11 degrees  Heel propped and gravity assist for straightening      Straight Leg Raise :  [x]  Able to complete without assistance/full range  []  Emerging/partial range  []  Unable to initiate   []  No notable muscle contractions    Functional Mobility  Supine to Sit: Independent to right side of bed  Sit to Stand and Stand to Sit: Independent   Locomotion  Ambulation: stand by assistance with front-wheeled walker, pt able to maneuver RW around objects and in small spaces without lob.  Ambulation Distance: 230 Feet  Stair Management: step to pattern with one rail and a crutch, pt practiced with crutch on R and on L to prepare for home environment    Number of Stairs: 12    Locomotion  Ambulation: stand by assistance with front-wheeled walker, pt able to maneuver RW around objects and in small spaces without lob.  Ambulation Distance: 230 Feet  Stair Management: step to pattern with one rail and a crutch, pt practiced with crutch on R and on L to prepare for home environment.    Number of Stairs: 12   Obtained and made proper fitting adjustments for crutches for home use, pt used these for stairs training this session.     Educated the patient to role of physical therapy, plan of care, goals of therapy, HEP, safety with mobility,  weight bearing precautions (pt is WBAT), and home safety.  Encouraged pt to perform LE AE there ex throughout the day to decrease effects of immobility and increase circulation. Encouraged pt to perform right knee Ther Ex 3-4 x's a day to increase strength and ROM, and to flex knee frequently; discouraged use of pillow under right knee so terminal extension can be obtained.  Pt receptive to all education and verbal instruction provided. Left pt reclined in chair, without needs, call bell in reach.     [employer] Am-PAC- Basic Mobility Inpatient score:  AM-PACT "6 Clicks" Basic Mobility Inpatient Short Form  Turning Over in Bed: None  Sitting Down On/Standing From Armchair: None  Lying on Back to Sitting on  Side of Bed: None  Assist Moving to/from Bed to Chair: None  Assist to Walk in Hospital Room: None  Assist to Climb 3-5 Steps with Railing: None  PT Basic Mobility Raw Score: 24   CMS 0-100% Score: 0.00%    Assessment:  Patient has met clinical pathway goals for gait and transfers.   Pt will benefit from Home Health PT to address the following deficits:  Decreased R knee/LE strength and ROM and Gait impairment  Prognosis: Good with continued PT status post acute discharge   Progress: Progressing toward goals   Patient Goal: to go home    Plan:  Continue with Physical Therapy services to address functional mobility, endurance, and strength deficits.   Focus next session on LE strengthening and ROM exercise and neuromuscular re-education   PT Frequency: twice a day M-F as appropriate, once daily on weekend if pt is not discharge home by then    Discharge Recommendation: Home with home health PT  DME Recommended for Discharge: Crutches issued this session

## 2013-12-03 NOTE — Discharge Summary (Signed)
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 Arthritis  . HTN (hypertension)  . Hyperlipemia  . OSA on CPAP  . LBBB (left bundle branch block)    Hospital Problems:  Principal Problem:   *Arthritis  Active Problems:   HTN (hypertension)   Hyperlipemia   S/P placement of cardiac pacemaker   OSA on CPAP   LBBB (left bundle branch block)      Problem Lists:  Patient Active Problem List   Diagnosis   . Arthritis   . HTN (hypertension)   . Hyperlipemia   . S/P placement of cardiac pacemaker   . OSA on CPAP   . LBBB (left bundle branch block)         Discharge Dx:   S/P UKA    Procedures performed:   Medial Knee Replacement    Hospital Course:   Uncomplicated.  The patient tolerated the procedure well and cleared his milestones for discharge.  Patient will follow up in 2 weeks for wound check.     Discharge Medications:     Current Discharge Medication List      START taking these medications    Details   docusate sodium 100 MG Cap Take 100 mg by mouth 2 (two) times daily.  Qty: 10 capsule, Refills: 0      oxyCODONE (OXYCONTIN) 10 MG 12 hr tablet Take 1 tablet (10 mg total) by mouth every 12 (twelve) hours.  Qty: 28 tablet, Refills: 0      oxyCODONE-acetaminophen (PERCOCET) 5-325 MG per tablet Take 1 tablet by mouth every 3 (three) hours as needed.  Qty: 60 tablet, Refills: 0      rivaroxaban (XARELTO) 10 MG Tab Take 1 tablet (10 mg total) by mouth daily with dinner.  Qty: 12 tablet, Refills: 0         CONTINUE these medications which have NOT CHANGED    Details   diphenhydrAMINE (BENADRYL) 25 MG tablet Take 25 mg by mouth as needed. OCC  TAKES AT NITE      doxazosin (CARDURA) 4 MG tablet Take 4 mg by mouth nightly.      losartan-hydrochlorothiazide (HYZAAR) 100-12.5 MG per tablet Take 1 tablet by mouth every evening.       Pseudoeph-Doxylamine-DM-APAP (NYQUIL PO) Take by mouth.      simvastatin (ZOCOR) 40 MG tablet Take 40 mg by mouth nightly.       traZODONE (DESYREL) 50 MG tablet Take 100 mg by mouth nightly.       Multiple Vitamins-Minerals (MULTIVITAMIN PO) Take by mouth.         STOP taking these medications       aspirin 81 MG tablet        UNABLE TO FIND                Discharge Instructions:   Patient is to follow up with his PCP as scheduled.  Patient is to follow up with my office in 2 weeks for a wound check.    Signed by: Nelva Nay  Craig Guess

## 2013-12-03 NOTE — Progress Notes (Signed)
Discharge instructions reviewed, all questions addressed.  Instructed to follow-up with Dr Gilmore Laroche in 2 weeks.  Reviewed signs and symptoms to report immediately:  Fever above 100.6, drainage, redness, warmth at incision site, pain which is not controlled with pain medications.  Pt discharged via wheelchair with wife to home

## 2013-12-03 NOTE — Plan of Care (Signed)
Ask3Teach3 Program    Education about New Medications and their Side effects    Dear Justin Cisneros,    Its been a pleasure taking care of you during your hospitalization here at [facility]. We have initiated a new program to educate our patients and/or their family members or designated personnel about the new medications started by your physicians and their indications along with the possible side effects. Multiple studies have shown that patients started on new medications are often unaware of the names of the medication along with the indications and their side effects which leads to decreased compliance with the medications.    During our conversation today on 12/03/2013  1:37 AM I have explained to you the name of the new medication and the indication along with some possible common side effects. Listed below are some of the new medications started during this hospitalization.     Please call the Nurse if you have any side effects while in hospital.     Please call 911 if you have any life threatening symptoms after you are discharged from the hospital.    Please inform your Primary care physician for common side effects which are not life threatening after discharge.    Medication: Hydromorphone(Dilaudid)   This Medication is used for:   Moderate to Severe pain    Common Side Effects are:   Drowsiness   Dizziness   Constipation   Itching    A note from your nurse:  Call your nurse immediately if you notice itching, hives, swelling or trouble breathing         Thank you for your time.    Fidela Salisbury, RN  12/03/2013  1:37 AM  [facility]  [address]  [place], [place]  [address]

## 2015-01-15 ENCOUNTER — Ambulatory Visit (INDEPENDENT_AMBULATORY_CARE_PROVIDER_SITE_OTHER): Payer: Self-pay | Admitting: Cardiovascular Disease

## 2015-02-22 ENCOUNTER — Ambulatory Visit (INDEPENDENT_AMBULATORY_CARE_PROVIDER_SITE_OTHER): Payer: Self-pay

## 2015-03-15 ENCOUNTER — Other Ambulatory Visit: Payer: Self-pay | Admitting: Cardiovascular Disease

## 2015-03-15 DIAGNOSIS — I447 Left bundle-branch block, unspecified: Secondary | ICD-10-CM

## 2015-03-18 ENCOUNTER — Other Ambulatory Visit (INDEPENDENT_AMBULATORY_CARE_PROVIDER_SITE_OTHER): Payer: Self-pay

## 2015-03-18 ENCOUNTER — Inpatient Hospital Stay
Admission: RE | Admit: 2015-03-18 | Discharge: 2015-03-18 | Disposition: A | Discharge status: Home / Self Care | Payer: Medicare Other | Source: Ambulatory Visit | Attending: Cardiovascular Disease | Admitting: Cardiovascular Disease

## 2015-03-18 DIAGNOSIS — R012 Other cardiac sounds: Secondary | ICD-10-CM | POA: Insufficient documentation

## 2015-03-18 DIAGNOSIS — I447 Left bundle-branch block, unspecified: Secondary | ICD-10-CM | POA: Insufficient documentation

## 2015-03-18 DIAGNOSIS — R9431 Abnormal electrocardiogram [ECG] [EKG]: Secondary | ICD-10-CM | POA: Insufficient documentation

## 2015-03-18 MED ORDER — TECHNETIUM TC 99M TETROFOSMIN INJECTION
1.0000 | Freq: Once | Status: AC | PRN
Start: 2015-03-18 — End: 2015-03-18
  Administered 2015-03-18: 1 via INTRAVENOUS
  Filled 2015-03-18: qty 1

## 2015-03-18 MED ORDER — REGADENOSON 0.4 MG/5ML IV SOLN
0.4000 mg | Freq: Once | INTRAVENOUS | Status: AC | PRN
Start: 2015-03-18 — End: 2015-03-18
  Administered 2015-03-18: 0.4 mg via INTRAVENOUS
  Filled 2015-03-18: qty 5

## 2015-06-14 ENCOUNTER — Ambulatory Visit (INDEPENDENT_AMBULATORY_CARE_PROVIDER_SITE_OTHER): Payer: Self-pay

## 2015-10-18 ENCOUNTER — Ambulatory Visit (INDEPENDENT_AMBULATORY_CARE_PROVIDER_SITE_OTHER): Payer: Self-pay

## 2016-05-03 LAB — VAHRT HISTORIC LVEF: Ejection Fraction: 56 %

## 2016-05-08 ENCOUNTER — Ambulatory Visit (INDEPENDENT_AMBULATORY_CARE_PROVIDER_SITE_OTHER): Payer: Self-pay | Admitting: Cardiovascular Disease

## 2016-05-15 ENCOUNTER — Ambulatory Visit (INDEPENDENT_AMBULATORY_CARE_PROVIDER_SITE_OTHER): Payer: Self-pay

## 2016-08-25 ENCOUNTER — Ambulatory Visit (INDEPENDENT_AMBULATORY_CARE_PROVIDER_SITE_OTHER): Payer: Self-pay

## 2016-12-20 ENCOUNTER — Ambulatory Visit (INDEPENDENT_AMBULATORY_CARE_PROVIDER_SITE_OTHER): Payer: Self-pay

## 2017-03-12 ENCOUNTER — Ambulatory Visit (INDEPENDENT_AMBULATORY_CARE_PROVIDER_SITE_OTHER): Payer: Self-pay

## 2017-08-01 ENCOUNTER — Ambulatory Visit (INDEPENDENT_AMBULATORY_CARE_PROVIDER_SITE_OTHER): Payer: Self-pay

## 2017-08-10 ENCOUNTER — Ambulatory Visit (INDEPENDENT_AMBULATORY_CARE_PROVIDER_SITE_OTHER): Payer: Self-pay | Admitting: Cardiovascular Disease

## 2017-09-24 ENCOUNTER — Encounter (INDEPENDENT_AMBULATORY_CARE_PROVIDER_SITE_OTHER): Payer: Self-pay | Admitting: Family

## 2017-09-24 ENCOUNTER — Ambulatory Visit (INDEPENDENT_AMBULATORY_CARE_PROVIDER_SITE_OTHER): Payer: Medicare Other | Admitting: Family

## 2017-09-24 VITALS — BP 151/67 | HR 70 | Temp 97.7°F | Resp 18 | Ht 69.0 in | Wt 172.0 lb

## 2017-09-24 DIAGNOSIS — J40 Bronchitis, not specified as acute or chronic: Secondary | ICD-10-CM

## 2017-09-24 MED ORDER — DOXYCYCLINE HYCLATE 100 MG PO CAPS
100.0000 mg | ORAL_CAPSULE | Freq: Two times a day (BID) | ORAL | 0 refills | Status: AC
Start: 2017-09-24 — End: 2017-10-01

## 2017-09-24 NOTE — Progress Notes (Signed)
[facility]    PROGRESS NOTE      Patient: Justin Cisneros   Date: 09/24/2017   MRN: [id number]     Past Medical History:   Diagnosis Date   . Arthritis     RT KNEE   . Fracture of unspecified bones 1970'S    RT FT-NO SX   . Hypertensive disorder    . 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 Sick sinus syndrome FEB 2005    PACEMAKER PLACED 2005, REPLACED 2014   . Sleep apnea     USES BIPAP   . Syncope and collapse 2005    PRIOR TO PACEMAKER INSERTION     Social History     Social History   . Marital status: Married     Spouse name: N/A   . Number of children: N/A   . Years of education: N/A     Occupational History   . Not on file.     Social History Main Topics   . Smoking status: Former Smoker     Years: 15.00     Quit date: 11/28/2003   . Smokeless tobacco: Never Used      Comment: OCC SMOKED  A CIGAR OR PIPE   . Alcohol use 12.6 oz/week     14 Glasses of wine, 7 Shots of liquor per week   . Drug use: No   . Sexual activity: Not on file     Other Topics Concern   . Not on file     Social History Narrative   . No narrative on file     History reviewed. No pertinent family history.    ASSESSMENT/PLAN     Justin Cisneros is a 76 y.o. male    Chief Complaint   Patient presents with   . URI        1. Bronchitis  - doxycycline (VIBRAMYCIN) 100 MG capsule; Take 1 capsule (100 mg total) by mouth 2 (two) times daily.for 7 days  Dispense: 14 capsule; Refill: 0    During the antibiotic course the productive cough might improve but may turn into a dry cough that can linger around for another 1-2 weeks. This is a normal pattern of bronchitis resolution.   You can take symptomatic relief cough medication and avoid smoking if you are a smoker, please follow up with PCP if symptoms worsen.    The differential diagnosis includes viral URI, pharyngitis, bronchitis, sinusitis, pneumonia, allergic rhinitis, RAD, post-nasal drip, or influenza.       Results for orders placed or  performed during the hospital encounter of 12/02/13   Hemoglobin and hematocrit, blood   Result Value Ref Range    Hgb 12.0 (L) 13.0 - 17.0 g/dL    Hematocrit 54.0 (L) 42.0 - 52.0 %   Hemoglobin and hematocrit, blood   Result Value Ref Range    Hgb 10.6 (L) 13.0 - 17.0 g/dL    Hematocrit 98.1 (L) 42.0 - 52.0 %   Type and screen   Result Value Ref Range    ABO Rh O NEG     AB Screen Gel NEG        Risk & Benefits of the new medication(s) were explained to the patient (and family) who verbalized understanding & agreed to the treatment plan. Patient (family) encouraged to contact me/clinical staff with any questions/concerns      MEDICATIONS     Current  Outpatient Prescriptions   Medication Sig Dispense Refill   . aspirin EC 81 MG EC tablet Take 81 mg by mouth daily.     Marland Kitchen doxazosin (CARDURA) 4 MG tablet Take 4 mg by mouth nightly.     Marland Kitchen losartan-hydrochlorothiazide (HYZAAR) 100-12.5 MG per tablet Take 1 tablet by mouth every evening.      . Multiple Vitamins-Minerals (MULTIVITAMIN PO) Take by mouth.     . Pseudoeph-Doxylamine-DM-APAP (NYQUIL PO) Take by mouth.     . simvastatin (ZOCOR) 40 MG tablet Take 40 mg by mouth nightly.      Marland Kitchen doxycycline (VIBRAMYCIN) 100 MG capsule Take 1 capsule (100 mg total) by mouth 2 (two) times daily.for 7 days 14 capsule 0     No current facility-administered medications for this visit.        No Known Allergies    SUBJECTIVE     Chief Complaint   Patient presents with   . URI        URI    This is a new problem. The current episode started in the past 7 days. The problem has been gradually worsening. There has been no fever. Associated symptoms include congestion, coughing, headaches, rhinorrhea and sinus pain. Pertinent negatives include no chest pain, ear pain, nausea, rash, sneezing, sore throat, vomiting or wheezing. He has tried decongestant (robitussin DM, Max for chest congestion, Nyquil and Dayquil) for the symptoms. The treatment provided mild relief.       ROS     Review of  Systems   Constitutional: Positive for fatigue and fever (last wednesday mild fever). Negative for chills.   HENT: Positive for congestion, rhinorrhea, sinus pain and sinus pressure. Negative for ear pain, postnasal drip, sneezing, sore throat and trouble swallowing.    Respiratory: Positive for cough. Negative for shortness of breath and wheezing.         Robitussin DM for cough, dayquil and nyquil for about 6 days   Cardiovascular: Negative for chest pain and palpitations.   Gastrointestinal: Negative for nausea and vomiting.   Musculoskeletal: Positive for myalgias.   Skin: Negative for rash.   Allergic/Immunologic: Negative for environmental allergies and food allergies.   Neurological: Positive for headaches. Negative for dizziness.   Hematological: Negative for adenopathy.       The following sections were reviewed this encounter by the provider:   Tobacco  Allergies  Meds  Problems  Med Hx  Surg Hx  Fam Hx  Soc Hx        The following sections were reviewed this encounter by the provider:   Tobacco  Allergies  Meds  Problems  Med Hx  Surg Hx  Fam Hx  Soc Hx          PHYSICAL EXAM     Vitals:    09/24/17 1407   BP: 151/67   BP Site: Right arm   Patient Position: Sitting   Cuff Size: Medium   Pulse: 70   Resp: 18   Temp: 97.7 F (36.5 C)   TempSrc: Tympanic   SpO2: 96%   Weight: 78 kg (172 lb)   Height: 1.753 m (5\' 9" )       Physical Exam   Constitutional: He is oriented to person, place, and time. Vital signs are normal. He appears well-developed and well-nourished. He is active.   HENT:   Head: Normocephalic and atraumatic.   Right Ear: Hearing, tympanic membrane, external ear and ear canal normal. No drainage, swelling or tenderness.  Tympanic membrane is not injected, not scarred, not perforated and not erythematous. No middle ear effusion. No decreased hearing is noted.   Left Ear: Hearing, tympanic membrane, external ear and ear canal normal. No drainage, swelling or tenderness. Tympanic  membrane is not injected, not scarred, not perforated and not erythematous.  No middle ear effusion. No decreased hearing is noted.   Nose: Mucosal edema present.   Mouth/Throat: Uvula is midline, oropharynx is clear and moist and mucous membranes are normal.   +PND with clear secretion   Eyes: Conjunctivae are normal.   Cardiovascular: Normal rate and regular rhythm.    Pulmonary/Chest: Effort normal. No respiratory distress. He has decreased breath sounds in the right upper field and the left upper field. He has no wheezes. He has no rales. He exhibits no tenderness.   Neurological: He is alert and oriented to person, place, and time.   Skin: Skin is warm and dry.   Psychiatric: He has a normal mood and affect. His behavior is normal. Judgment and thought content normal.   Nursing note and vitals reviewed.    Ortho Exam  Neurologic Exam     Mental Status   Oriented to person, place, and time.       PROCEDURE(S)     Procedures        Signed,  Eliezer Lofts, NP  09/24/2017

## 2017-09-24 NOTE — Patient Instructions (Signed)
Bronchitis, Antibiotic Treatment (Adult)    Bronchitis is an infection of the air passages (bronchial tubes) in your lungs. It often occurs when you have a cold. This illness is contagious during the first few days and is spread through the air by coughing and sneezing, or by direct contact (touching the sick person and then touching your own eyes, nose, or mouth).  Symptoms of bronchitis include cough with mucus (phlegm) and low-grade fever. Bronchitis usually lasts 7 to 14 days. Mild cases can be treated with simple home remedies. More severe infection is treated with an antibiotic.  Home care  Follow these guidelines when caring for yourself at home:   If your symptoms are severe, rest at home for the first 2 to 3 days. When you go back to your usual activities, don't let yourself get too tired.   Do not smoke. Also avoid being exposed to secondhand smoke.   You may use over-the-counter medicines to control fever or pain, unless another medicine was prescribed. (Note: If you have chronic liver or kidney disease or have ever had a stomach ulcer or gastrointestinal bleeding, talk with your healthcare provider before using these medicines. Also talk to your provider if you are taking medicine to prevent blood clots.) Aspirin should never be given to anyone younger than 74 years of age who is ill with a viral infection or fever. It may cause severe liver or brain damage.   Your appetite may be poor, so a light diet is fine. Avoid dehydration by drinking 6 to 8 glasses of fluids per day (such as water, soft drinks, sports drinks, juices, tea, or soup). Extra fluids will help loosen secretions in the nose and lungs.   Over-the-counter cough, cold, and sore-throat medicines will not shorten the length of the illness, but they may be helpful to reduce symptoms. (Note: Do not use decongestants if you have high blood pressure.)   Finish all antibiotic medicine. Do this even if you are feeling better after only a  few days.  Follow-up care  Follow up with your healthcare provider, or as advised. If you had an X-ray or ECG (electrocardiogram), a specialist will review it. You will be notified of any new findings that may affect your care.  Note: If you are age 65 or older, or if you have a chronic lung disease or condition that affects your immune system, or you smoke, talk to your healthcare provider about having pneumococcal vaccinations and a yearly influenza vaccination (flu shot).  When to seek medical advice  Call your healthcare provider right away if any of these occur:   Fever of 100.4F (38C) or higher   Coughing up increased amounts of colored sputum   Weakness, drowsiness, headache, facial pain, ear pain, or a stiff neck  Call 911, or get immediate medical care  Contact emergency services right away if any of these occur.   Coughing up blood   Worsening weakness, drowsiness, headache, or stiff neck   Trouble breathing, wheezing, or pain with breathing  Date Last Reviewed: 08/09/2014   2000-2016 [employer] [address], [place] [address]. All rights reserved. This information is not intended as a substitute for professional medical care. Always follow your healthcare professional's instructions.

## 2017-10-16 ENCOUNTER — Encounter (INDEPENDENT_AMBULATORY_CARE_PROVIDER_SITE_OTHER): Payer: Self-pay | Admitting: Family

## 2017-10-16 ENCOUNTER — Ambulatory Visit (INDEPENDENT_AMBULATORY_CARE_PROVIDER_SITE_OTHER): Payer: Medicare Other | Admitting: Family

## 2017-10-16 ENCOUNTER — Encounter (INDEPENDENT_AMBULATORY_CARE_PROVIDER_SITE_OTHER): Payer: Medicare Other | Admitting: Family Medicine

## 2017-10-16 VITALS — BP 132/81 | HR 70 | Temp 97.9°F | Resp 16 | Ht 69.0 in | Wt 174.0 lb

## 2017-10-16 DIAGNOSIS — R059 Cough, unspecified: Secondary | ICD-10-CM

## 2017-10-16 DIAGNOSIS — R0982 Postnasal drip: Secondary | ICD-10-CM

## 2017-10-16 DIAGNOSIS — R05 Cough: Secondary | ICD-10-CM

## 2017-10-16 MED ORDER — FLUTICASONE PROPIONATE 50 MCG/ACT NA SUSP
1.0000 | Freq: Two times a day (BID) | NASAL | 0 refills | Status: DC
Start: 2017-10-16 — End: 2018-12-06

## 2017-10-16 MED ORDER — BENZONATATE 200 MG PO CAPS
200.0000 mg | ORAL_CAPSULE | Freq: Three times a day (TID) | ORAL | 0 refills | Status: DC | PRN
Start: 2017-10-16 — End: 2017-10-31

## 2017-10-16 NOTE — Progress Notes (Signed)
Have you seen any specialists/other providers since your last visit with us?    No    Arm preference verified?   Yes    The patient is due for influenza vaccine

## 2017-10-16 NOTE — Progress Notes (Signed)
[facility]    PROGRESS NOTE      Patient: Justin Cisneros   Date: 10/16/2017   MRN: [id number]     Past Medical History:   Diagnosis Date   . Arthritis     RT KNEE   . Fracture of unspecified bones 1970'S    RT FT-NO SX   . Hypertensive disorder    . 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 Sick sinus syndrome FEB 2005    PACEMAKER PLACED 2005, REPLACED 2014   . Sleep apnea     USES BIPAP   . Syncope and collapse 2005    PRIOR TO PACEMAKER INSERTION     Social History     Social History   . Marital status: Married     Spouse name: N/A   . Number of children: N/A   . Years of education: N/A     Occupational History   . Not on file.     Social History Main Topics   . Smoking status: Former Smoker     Years: 15.00     Quit date: 11/28/2003   . Smokeless tobacco: Never Used      Comment: OCC SMOKED  A CIGAR OR PIPE   . Alcohol use 12.6 oz/week     14 Glasses of wine, 7 Shots of liquor per week   . Drug use: No   . Sexual activity: Not on file     Other Topics Concern   . Not on file     Social History Narrative   . No narrative on file     History reviewed. No pertinent family history.    ASSESSMENT/PLAN     Justin Cisneros is a 76 y.o. male    Chief Complaint   Patient presents with   . Cough        1. Cough  - benzonatate (TESSALON) 200 MG capsule; Take 1 capsule (200 mg total) by mouth 3 (three) times daily as needed for Cough.  Dispense: 30 capsule; Refill: 0    2. Post-nasal drip  - fluticasone (FLONASE) 50 MCG/ACT nasal spray; 1 spray by Nasal route 2 (two) times daily.  Dispense: 1 Bottle; Refill: 0         It is likely that your symptoms are due to post nasal drip. Recommend starting OTC flonase BID, daily OTC daytime antihistamine such as claritin or zyrtec or allegra to help open up nasal passageways and minimize congestion and secretions. May start OTC cough suppressant and expectorant such as mucinex DM as well as tessalon perles to decrease cough symptoms.  Mechanism of action of tessalon perles reviewed with pt. Increase vitamin intake for immune support. Increase your fluid intake with water or electrolyte beverages (gatorade, pedialyte). The ingestion of warm liquids may help loosen respiratory secretions, thereby enhancing their removal. May start saline nasal spray or nasal drops may be used to cleanse the nasal passages and sinuses if desired. A humidfier or warm shower may help losen secretions. Practice good hand hygiene. Get plenty of rest. Symptoms may last for another 1-2 weeks, but should slowly improve. RTC if symptoms worsen or persist, will consider CXR at this time. Pt states he does not think he needs a chest xray today. Patient is agreeable to plan, has no further questions.      MEDICATIONS     Current Outpatient Prescriptions   Medication Sig Dispense Refill   .  aspirin EC 81 MG EC tablet Take 81 mg by mouth daily.     Marland Kitchen doxazosin (CARDURA) 4 MG tablet Take 4 mg by mouth nightly.     Marland Kitchen losartan-hydrochlorothiazide (HYZAAR) 100-12.5 MG per tablet Take 1 tablet by mouth every evening.      . Multiple Vitamins-Minerals (MULTIVITAMIN PO) Take by mouth.     . simvastatin (ZOCOR) 40 MG tablet Take 40 mg by mouth nightly.      . benzonatate (TESSALON) 200 MG capsule Take 1 capsule (200 mg total) by mouth 3 (three) times daily as needed for Cough. 30 capsule 0   . fluticasone (FLONASE) 50 MCG/ACT nasal spray 1 spray by Nasal route 2 (two) times daily. 1 Bottle 0     No current facility-administered medications for this visit.        No Known Allergies    SUBJECTIVE     Chief Complaint   Patient presents with   . Cough        Patient states Z pack works well for him in the past. He notes he had a standing order for zpak when he was in training for the [facility]. He has not had the zpak in many years. Is suppose ot use a BiPAP machine but does not, does feel like he sleeps better when he uses it. Does also have deviated septum and has chronic congestion and  drainage.       Cough   The current episode started more than 1 month ago. The problem has been unchanged ("the cough is more annoying at this point"). The cough is non-productive (had been productive but no longer). Associated symptoms include nasal congestion, postnasal drip and rhinorrhea. Pertinent negatives include no chest pain, chills, ear congestion, ear pain, fever, headaches, heartburn, hemoptysis, myalgias, rash, sore throat, shortness of breath, sweats, weight loss or wheezing. Associated symptoms comments: Chest congestion. Treatments tried: doxycycline, Nyquil, Dayquil. The treatment provided mild relief. There is no history of asthma, bronchitis, environmental allergies or pneumonia.       ROS     Review of Systems   Constitutional: Negative for appetite change, chills, diaphoresis, fatigue, fever and weight loss.   HENT: Positive for congestion, postnasal drip and rhinorrhea. Negative for ear pain, sinus pain, sinus pressure, sneezing, sore throat, tinnitus, trouble swallowing and voice change.    Eyes: Negative.    Respiratory: Positive for cough (dry, frequent attacks). Negative for hemoptysis, chest tightness, shortness of breath and wheezing.    Cardiovascular: Negative for chest pain.   Gastrointestinal: Negative.  Negative for heartburn.   Musculoskeletal: Negative for myalgias.   Skin: Negative for rash.   Allergic/Immunologic: Negative for environmental allergies.   Neurological: Negative for dizziness, facial asymmetry, light-headedness and headaches.       The following sections were reviewed this encounter by the provider:   Tobacco  Allergies  Meds  Problems  Med Hx  Surg Hx  Fam Hx  Soc Hx          PHYSICAL EXAM     Vitals:    10/16/17 1354   BP: 132/81   BP Site: Right arm   Patient Position: Sitting   Cuff Size: Large   Pulse: 70   Resp: 16   Temp: 97.9 F (36.6 C)   TempSrc: Oral   SpO2: 98%   Weight: 78.9 kg (174 lb)   Height: 1.753 m (5\' 9" )       Physical Exam    Constitutional: He is  oriented to person, place, and time. He appears well-developed and well-nourished. No distress.   HENT:   Head: Normocephalic and atraumatic.   Right Ear: Tympanic membrane, external ear and ear canal normal.   Left Ear: Tympanic membrane, external ear and ear canal normal.   Nose: Mucosal edema (bilat, left worse than right) and rhinorrhea (clear, thin) present. Right sinus exhibits no maxillary sinus tenderness and no frontal sinus tenderness. Left sinus exhibits no maxillary sinus tenderness and no frontal sinus tenderness.   Mouth/Throat: Uvula is midline and oropharynx is clear and moist. No uvula swelling. No oropharyngeal exudate, posterior oropharyngeal edema, posterior oropharyngeal erythema or tonsillar abscesses. Tonsils are 0 on the right. Tonsils are 0 on the left. No tonsillar exudate.   Profuse clear post nasal drip   Eyes: Pupils are equal, round, and reactive to light. Conjunctivae and EOM are normal. Right eye exhibits no discharge. Left eye exhibits no discharge. No scleral icterus.   Neck: Normal range of motion. Neck supple.   Cardiovascular: Normal rate, regular rhythm, normal heart sounds and intact distal pulses.  Exam reveals no gallop and no friction rub.    No murmur heard.  Pulmonary/Chest: Effort normal and breath sounds normal. He has no wheezes. He has no rales. He exhibits no tenderness.   GOOD air exchange in all fields   Lymphadenopathy:     He has no cervical adenopathy.   Neurological: He is alert and oriented to person, place, and time.   Skin: Skin is warm and dry. Capillary refill takes less than 2 seconds. No rash noted. He is not diaphoretic. No erythema. No pallor.   Psychiatric: He has a normal mood and affect. His behavior is normal.     Ortho Exam  Neurologic Exam     Mental Status   Oriented to person, place, and time.     Cranial Nerves     CN III, IV, VI   Pupils are equal, round, and reactive to light.  Extraocular motions are normal.        PROCEDURE(S)     Procedures        Signed,  Ronaldo Miyamoto, FNP  10/16/2017

## 2017-10-16 NOTE — Patient Instructions (Signed)
Fluticasone Furoate Nasal spray  What is this medicine?  FLUTICASONE (floo TIK a sone) is a corticosteroid. This medicine is used to treat the symptoms of allergies like sneezing, itchy red eyes, and itchy, runny or stuffy nose.  How should I use this medicine?  This medicine is for use in the nose. Follow the directions on your prescription label. Shake well before using. Do not use more often than directed. Make sure that you are using your nasal spray correctly. Ask you doctor or health care provider if you have any questions.  Talk to your pediatrician regarding the use of this medicine in children. While this drug may be prescribed for children as young as 60 years of age for selected conditions, precautions do apply.  What side effects may I notice from receiving this medicine?  Side effects that you should report to your doctor or health care professional as soon as possible:   allergic reactions like skin rash, itching or hives, swelling of the face, lips, or tongue   changes in vision   flu-like symptoms   nose bleeding, sores   white patches or sores in the mouth or nose  Side effects that usually do not require medical attention (report to your doctor or health care professional if they continue or are bothersome):   burning or irritation inside the nose or throat   cough   headache   unusual taste or smell  What may interact with this medicine?   ketoconazole   metyrapone   some medicines for HIV   vaccines  What if I miss a dose?  If you miss a dose, use it as soon as you can. If it is almost time for your next dose, use only that dose. Do not use double or extra doses.  Where should I keep my medicine?  Keep out of the reach of children.  Store at room temperature between 15 and 30 degrees C (59 and 86 degrees F). Do not keep this medicine in the refrigerator or in the freezer. Throw away any unused medicine after the expiration date.  What should I tell my health care provider before I  take this medicine?  They need to know if you have any of these conditions:   cataracts   glaucoma   infection, like tuberculosis, herpes, or fungal infection   recent surgery or injury of the nose or sinuses   taking corticosteroid by mouth   an unusual or allergic reaction to fluticasone, steroids, other medicines, foods, dyes, or preservatives   pregnant or trying to get pregnant   breast-feeding  What should I watch for while using this medicine?  Visit your doctor for regular check ups. Tell your doctor or healthcare professional if your symptoms do not start to get better or if they get worse.  This medicine may increase your risk of getting an infection. Tell your doctor or health care professional if you are around anyone with measles or chickenpox, or if you develop sores or blisters that do not heal properly.  NOTE:This sheet is a summary. It may not cover all possible information. If you have questions about this medicine, talk to your doctor, pharmacist, or health care provider. Copyright 2018 [employer]        Benzonatate capsules  Brand Names: Jerilynn Som, Zonatuss  What is this medicine?  BENZONATATE (ben ZOE na tate) is used to treat cough.  How should I use this medicine?  Take this medicine by mouth with  a glass of water. Follow the directions on the prescription label. Avoid breaking, chewing, or sucking the capsule, as this can cause serious side effects. Take your medicine at regular intervals. Do not take your medicine more often than directed.  Talk to your pediatrician regarding the use of this medicine in children. While this drug may be prescribed for children as young as 55 years old for selected conditions, precautions do apply.  What side effects may I notice from receiving this medicine?  Side effects that you should report to your doctor or health care professional as soon as possible:   allergic reactions like skin rash, itching or hives, swelling of the face, lips, or  tongue   breathing problems   chest pain   confusion or hallucinations   irregular heartbeat   numbness of mouth or throat   seizures  Side effects that usually do not require medical attention (report to your doctor or health care professional if they continue or are bothersome):   burning feeling in the eyes   constipation   headache   nasal congestion   stomach upset  What may interact with this medicine?  Do not take this medicine with any of the following medications:   MAOIs like Carbex, Eldepryl, Marplan, Nardil, and Parnate  What if I miss a dose?  If you miss a dose, take it as soon as you can. If it is almost time for your next dose, take only that dose. Do not take double or extra doses.  Where should I keep my medicine?  Keep out of the reach of children.  Store at room temperature between 15 and 30 degrees C (59 and 86 degrees F). Keep tightly closed. Protect from light and moisture. Throw away any unused medicine after the expiration date.  What should I tell my health care provider before I take this medicine?  They need to know if you have any of these conditions:   kidney or liver disease   an unusual or allergic reaction to benzonatate, anesthetics, other medicines, foods, dyes, or preservatives   pregnant or trying to get pregnant   breast-feeding  What should I watch for while using this medicine?  Tell your doctor if your symptoms do not improve or if they get worse. If you have a high fever, skin rash, or headache, see your health care professional.  You may get drowsy or dizzy. Do not drive, use machinery, or do anything that needs mental alertness until you know how this medicine affects you. Do not sit or stand up quickly, especially if you are an older patient. This reduces the risk of dizzy or fainting spells.  NOTE:This sheet is a summary. It may not cover all possible information. If you have questions about this medicine, talk to your doctor, pharmacist, or health care  provider. Copyright 2018 [employer]

## 2017-10-27 ENCOUNTER — Other Ambulatory Visit (INDEPENDENT_AMBULATORY_CARE_PROVIDER_SITE_OTHER): Payer: Self-pay | Admitting: Family

## 2017-10-27 DIAGNOSIS — R059 Cough, unspecified: Secondary | ICD-10-CM

## 2017-10-31 ENCOUNTER — Ambulatory Visit (INDEPENDENT_AMBULATORY_CARE_PROVIDER_SITE_OTHER): Payer: Medicare Other | Admitting: Family

## 2017-10-31 ENCOUNTER — Encounter (INDEPENDENT_AMBULATORY_CARE_PROVIDER_SITE_OTHER): Payer: Self-pay

## 2017-10-31 VITALS — BP 145/83 | HR 76 | Temp 97.9°F | Resp 16 | Wt 177.0 lb

## 2017-10-31 DIAGNOSIS — R059 Cough, unspecified: Secondary | ICD-10-CM

## 2017-10-31 DIAGNOSIS — R05 Cough: Secondary | ICD-10-CM

## 2017-10-31 DIAGNOSIS — R0982 Postnasal drip: Secondary | ICD-10-CM

## 2017-10-31 DIAGNOSIS — R0981 Nasal congestion: Secondary | ICD-10-CM

## 2017-10-31 MED ORDER — FLUTICASONE PROPIONATE 50 MCG/ACT NA SUSP
2.0000 | Freq: Every day | NASAL | 1 refills | Status: DC
Start: 2017-10-31 — End: 2018-12-06

## 2017-10-31 NOTE — Progress Notes (Signed)
[facility]    PROGRESS NOTE      Patient: Justin Cisneros   Date: 10/31/2017   MRN: [id number]     Past Medical History:   Diagnosis Date   . Arthritis     RT KNEE   . Fracture of unspecified bones 1970'S    RT FT-NO SX   . Hypertensive disorder    . 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 Sick sinus syndrome FEB 2005    PACEMAKER PLACED 2005, REPLACED 2014   . Sleep apnea     USES BIPAP   . Syncope and collapse 2005    PRIOR TO PACEMAKER INSERTION     Social History     Social History   . Marital status: Married     Spouse name: N/A   . Number of children: N/A   . Years of education: N/A     Occupational History   . Not on file.     Social History Main Topics   . Smoking status: Former Smoker     Years: 15.00     Quit date: 11/28/2003   . Smokeless tobacco: Never Used      Comment: OCC SMOKED  A CIGAR OR PIPE   . Alcohol use 12.6 oz/week     14 Glasses of wine, 7 Shots of liquor per week   . Drug use: No   . Sexual activity: Not on file     Other Topics Concern   . Not on file     Social History Narrative   . No narrative on file     History reviewed. No pertinent family history.    ASSESSMENT/PLAN     Justin Cisneros is a 76 y.o. male    Chief Complaint   Patient presents with   . Cough     follow up        1. Post-nasal drip    2. Nasal congestion  - fluticasone (FLONASE) 50 MCG/ACT nasal spray; 2 sprays by Nasal route daily.  Dispense: 1 Bottle; Refill: 1    3. Cough         Symptoms resolved. Lingering post nasal drainage, to continue flonase once daily for 3 weeks. Pt is agreeable with plan of care as discussed. May contact me/clinical staff with any questions/concerns. All questions answered today.     MEDICATIONS     Current Outpatient Prescriptions   Medication Sig Dispense Refill   . aspirin EC 81 MG EC tablet Take 81 mg by mouth daily.     Marland Kitchen doxazosin (CARDURA) 4 MG tablet Take 4 mg by mouth nightly.     . fluticasone (FLONASE) 50 MCG/ACT nasal spray 1  spray by Nasal route 2 (two) times daily. 1 Bottle 0   . losartan-hydrochlorothiazide (HYZAAR) 100-12.5 MG per tablet Take 1 tablet by mouth every evening.      . Multiple Vitamins-Minerals (MULTIVITAMIN PO) Take by mouth.     . simvastatin (ZOCOR) 40 MG tablet Take 40 mg by mouth nightly.      . fluticasone (FLONASE) 50 MCG/ACT nasal spray 2 sprays by Nasal route daily. 1 Bottle 1     No current facility-administered medications for this visit.        No Known Allergies    SUBJECTIVE     Chief Complaint   Patient presents with   . Cough     follow up  Cough   Episode onset: more one month ago. The problem has been resolved. The cough is non-productive. Associated symptoms include nasal congestion, postnasal drip and rhinorrhea. Pertinent negatives include no chills, ear congestion, ear pain, fever, headaches, rash, sore throat, shortness of breath, sweats, weight loss or wheezing. There is no history of asthma, bronchitis, environmental allergies or pneumonia.   Patient here to follow up on cough.  Cough much improved.  Would like to discuss current medication regimen and plan. Was treated with doxycycline in Sep 24, 2017 for bronchitis and returned on 10/16/17 as symptoms of cough were not improving. Was advised to take mucinex DM, tessalon, flonase and zyrtec. Pt has been taking all medications as prescribed.        ROS     Review of Systems   Constitutional: Negative for appetite change, chills, diaphoresis, fatigue, fever and weight loss.   HENT: Positive for postnasal drip and rhinorrhea. Negative for congestion, ear pain, sinus pain, sinus pressure, sneezing, sore throat, tinnitus, trouble swallowing and voice change.    Respiratory: Positive for cough (dry, infrequent). Negative for apnea, chest tightness, shortness of breath and wheezing.    Cardiovascular: Negative.    Gastrointestinal: Negative.    Musculoskeletal: Negative.    Skin: Negative for rash.   Allergic/Immunologic: Negative for  environmental allergies.   Neurological: Negative for dizziness, facial asymmetry, light-headedness and headaches.       The following sections were reviewed this encounter by the provider:   Tobacco  Allergies  Meds  Problems  Med Hx  Surg Hx  Fam Hx  Soc Hx          PHYSICAL EXAM     Vitals:    10/31/17 1313   BP: 145/83   BP Site: Right arm   Patient Position: Sitting   Cuff Size: Large   Pulse: 76   Resp: 16   Temp: 97.9 F (36.6 C)   TempSrc: Oral   SpO2: 98%   Weight: 80.3 kg (177 lb)       Physical Exam   Constitutional: He is oriented to person, place, and time. He appears well-developed and well-nourished. No distress.   HENT:   Head: Normocephalic and atraumatic.   Right Ear: Tympanic membrane, external ear and ear canal normal.   Left Ear: Tympanic membrane, external ear and ear canal normal.   Nose: Mucosal edema (right worse than left, mild) present. No rhinorrhea. Right sinus exhibits no maxillary sinus tenderness and no frontal sinus tenderness. Left sinus exhibits no maxillary sinus tenderness and no frontal sinus tenderness.   Mouth/Throat: Uvula is midline and oropharynx is clear and moist. No uvula swelling. No oropharyngeal exudate, posterior oropharyngeal edema, posterior oropharyngeal erythema or tonsillar abscesses. Tonsils are 0 on the right. Tonsils are 0 on the left. No tonsillar exudate.   Mild clear post nasal drip   Eyes: Pupils are equal, round, and reactive to light. Conjunctivae and EOM are normal. Right eye exhibits no discharge. Left eye exhibits no discharge. No scleral icterus.   Neck: Normal range of motion. Neck supple.   Cardiovascular: Normal rate, regular rhythm, normal heart sounds and intact distal pulses.  Exam reveals no gallop and no friction rub.    No murmur heard.  Pulmonary/Chest: Effort normal and breath sounds normal. No respiratory distress. He has no wheezes. He has no rales. He exhibits no tenderness.   GOOD air exchange in all fields   Lymphadenopathy:      He has no  cervical adenopathy.   Neurological: He is alert and oriented to person, place, and time.   Skin: Skin is warm and dry. Capillary refill takes less than 2 seconds. No rash noted. He is not diaphoretic. No erythema. No pallor.   Psychiatric: He has a normal mood and affect. His behavior is normal.     Ortho Exam  Neurologic Exam     Mental Status   Oriented to person, place, and time.     Cranial Nerves     CN III, IV, VI   Pupils are equal, round, and reactive to light.  Extraocular motions are normal.       PROCEDURE(S)     Procedures        Signed,  Ronaldo Miyamoto, FNP  10/31/2017

## 2017-10-31 NOTE — Patient Instructions (Signed)
Fluticasone nasal spray  Brand Names: Flonase, Flonase Allergy Relief, Flonase Sensimist, XHANCE  What is this medicine?  FLUTICASONE (floo TIK a sone) is a corticosteroid. This medicine is used to treat the symptoms of allergies like sneezing, itchy red eyes, and itchy, runny, or stuffy nose. This medicine is also used to treat nasal polyps.  How should I use this medicine?  This medicine is for use in the nose. Follow the directions on your product or prescription label. This medicine works best if used at regular intervals. Do not use more often than directed. Make sure that you are using your nasal spray correctly. After 6 months of daily use for allergies, talk to your doctor or health care professional before using it for a longer time. Ask your doctor or health care professional if you have any questions.  Talk to your pediatrician regarding the use of this medicine in children. Special care may be needed. Some products have been used for allergies in children as young as 2 years. After 2 months of daily use without a prescription in a child, talk to your pediatrician before using it for a longer time. Use of this medicine for nasal polyps is not approved in children.  What side effects may I notice from receiving this medicine?  Side effects that you should report to your doctor or health care professional as soon as possible:   allergic reactions like skin rash, itching or hives, swelling of the face, lips, or tongue   changes in vision   crusting or sores in the nose   nosebleed   signs and symptoms of infection like fever or chills; cough; sore throat   white patches or sores in the mouth or nose  Side effects that usually do not require medical attention (report to your doctor or health care professional if they continue or are bothersome):   burning or irritation inside the nose or throat   cough   headache   unusual taste or smell  What may interact with this medicine?     certain  antibiotics like clarithromycin and telithromycin   certain medicines for fungal infections like ketoconazole, itraconazole, and voriconazole   conivaptan   nefazodone   some medicines for HIV   vaccines  What if I miss a dose?  If you miss a dose, use it as soon as you remember. If it is almost time for your next dose, use only that dose and continue with your regular schedule. Do not use double or extra doses.  Where should I keep my medicine?  Keep out of the reach of children.  Store at room temperature between 15 and 30 degrees C (59 and 86 degrees F). Avoid exposure to extreme heat, cold, or light. Throw away any unused medicine after the expiration date.  What should I tell my health care provider before I take this medicine?  They need to know if you have any of these conditions:   cataracts   glaucoma   infection, like tuberculosis, herpes, or fungal infection   recent surgery on nose or sinuses   taking a corticosteroid by mouth   an unusual or allergic reaction to fluticasone, steroids, other medicines, foods, dyes, or preservatives   pregnant or trying to get pregnant   breast-feeding  What should I watch for while using this medicine?  Visit your doctor or health care professional for regular checks on your progress. Some symptoms may improve within 12 hours after starting use. Check   with your doctor or health care professional if there is no improvement in your symptoms after 3 weeks of use.  This medicine may increase your risk of getting an infection. Tell your doctor or health care professional if you are around anyone with measles or chickenpox, or if you develop sores or blisters that do not heal properly.  NOTE:This sheet is a summary. It may not cover all possible information. If you have questions about this medicine, talk to your doctor, pharmacist, or health care provider. Copyright 2018 [employer]

## 2018-01-14 ENCOUNTER — Ambulatory Visit (INDEPENDENT_AMBULATORY_CARE_PROVIDER_SITE_OTHER): Payer: Self-pay

## 2018-04-15 ENCOUNTER — Ambulatory Visit (INDEPENDENT_AMBULATORY_CARE_PROVIDER_SITE_OTHER): Payer: Self-pay

## 2018-06-20 ENCOUNTER — Other Ambulatory Visit: Payer: Self-pay

## 2018-08-06 ENCOUNTER — Other Ambulatory Visit (INDEPENDENT_AMBULATORY_CARE_PROVIDER_SITE_OTHER): Payer: Self-pay | Admitting: Cardiovascular Disease

## 2018-08-12 ENCOUNTER — Ambulatory Visit (INDEPENDENT_AMBULATORY_CARE_PROVIDER_SITE_OTHER): Payer: Self-pay | Admitting: Cardiovascular Disease

## 2018-10-16 ENCOUNTER — Encounter (INDEPENDENT_AMBULATORY_CARE_PROVIDER_SITE_OTHER): Payer: Self-pay

## 2018-11-13 ENCOUNTER — Encounter (INDEPENDENT_AMBULATORY_CARE_PROVIDER_SITE_OTHER): Payer: Self-pay

## 2018-12-04 ENCOUNTER — Encounter (INDEPENDENT_AMBULATORY_CARE_PROVIDER_SITE_OTHER): Payer: Self-pay

## 2018-12-06 ENCOUNTER — Ambulatory Visit (INDEPENDENT_AMBULATORY_CARE_PROVIDER_SITE_OTHER): Payer: Medicare Other | Admitting: Nurse Practitioner

## 2018-12-06 ENCOUNTER — Encounter (INDEPENDENT_AMBULATORY_CARE_PROVIDER_SITE_OTHER): Payer: Self-pay | Admitting: Nurse Practitioner

## 2018-12-06 VITALS — BP 121/78 | HR 75 | Temp 97.9°F | Resp 12 | Wt 172.0 lb

## 2018-12-06 DIAGNOSIS — R058 Other specified cough: Secondary | ICD-10-CM

## 2018-12-06 DIAGNOSIS — R05 Cough: Secondary | ICD-10-CM

## 2018-12-06 MED ORDER — FLUTICASONE PROPIONATE 50 MCG/ACT NA SUSP
2.0000 | Freq: Every day | NASAL | 0 refills | Status: DC
Start: 2018-12-06 — End: 2018-12-30

## 2018-12-06 MED ORDER — BENZONATATE 100 MG PO CAPS
100.0000 mg | ORAL_CAPSULE | Freq: Three times a day (TID) | ORAL | 0 refills | Status: DC | PRN
Start: 2018-12-06 — End: 2018-12-30

## 2018-12-06 NOTE — Progress Notes (Signed)
[facility]    PROGRESS NOTE      Patient: Justin Cisneros   Date: 12/06/2018   MRN: [id number]       SUBJECTIVE     Chief Complaint   Patient presents with    Cough        Cough   This is a new problem. Episode onset: 2-3week. The problem has been waxing and waning. The cough is non-productive (clear sputum at times). Associated symptoms include postnasal drip and rhinorrhea. Pertinent negatives include no chills, ear pain, fever, headaches, myalgias, sore throat, shortness of breath or wheezing. Treatments tried: robitussin, benadryl. The treatment provided mild relief.       ROS     Review of Systems   Constitutional: Negative for appetite change, chills, fatigue and fever.   HENT: Positive for postnasal drip and rhinorrhea. Negative for congestion, ear pain, sinus pressure and sore throat.    Respiratory: Positive for cough. Negative for shortness of breath and wheezing.    Cardiovascular: Negative.    Gastrointestinal: Negative for nausea and vomiting.   Musculoskeletal: Negative for myalgias.   Skin: Negative for pallor.   Allergic/Immunologic: Negative for immunocompromised state.   Neurological: Negative for headaches.   Hematological: Negative for adenopathy.   Psychiatric/Behavioral: Negative for sleep disturbance.   All other systems reviewed and are negative.      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    Sick sinus syndrome FEB 2005    PACEMAKER PLACED 2005, REPLACED 2014    Sleep apnea     USES BIPAP    Syncope and collapse 2005    PRIOR TO PACEMAKER INSERTION     Social History     Socioeconomic History    Marital status: Married     Spouse name: Not on file    Number of children: Not on file    Years of education: Not on file    Highest education level: Not on file   Occupational History     Not on file   Social Needs    Financial resource strain: Not on file    Food insecurity:     Worry: Not on file     Inability: Not on file    Transportation needs:     Medical: Not on file     Non-medical: Not on file   Tobacco Use    Smoking status: Former Smoker     Years: 15.00     Last attempt to quit: 11/28/2003     Years since quitting: 15.0    Smokeless tobacco: Never Used    Tobacco comment: OCC SMOKED  A CIGAR OR PIPE   Substance and Sexual Activity    Alcohol use: Yes     Alcohol/week: 21.0 standard drinks     Types: 14 Glasses of wine, 7 Shots of liquor per week    Drug use: No    Sexual activity: Not on file   Lifestyle    Physical activity:     Days per week: Not on file     Minutes per session: Not on file    Stress: Not on file   Relationships    Social connections:  Talks on phone: Not on file     Gets together: Not on file     Attends religious service: Not on file     Active member of club or organization: Not on file     Attends meetings of clubs or organizations: Not on file     Relationship status: Not on file    Intimate partner violence:     Fear of current or ex partner: Not on file     Emotionally abused: Not on file     Physically abused: Not on file     Forced sexual activity: Not on file   Other Topics Concern    Not on file   Social History Narrative    Not on file     History reviewed. No pertinent family history.    The following sections were reviewed this encounter by the provider:   Tobacco   Allergies   Meds   Problems   Med Hx   Surg Hx   Fam Hx          MEDICATIONS     No Known Allergies    Outpatient Medications Marked as Taking for the 12/06/18 encounter (Office Visit) with Maree Erie, FNP   Medication Sig Dispense Refill    aspirin EC 81 MG EC tablet Take 81 mg by mouth daily.      doxazosin (CARDURA) 4 MG tablet Take 4 mg by mouth nightly.      losartan-hydrochlorothiazide (HYZAAR) 100-12.5 MG per tablet Take 1 tablet by mouth every evening.        Melatonin 5 MG Cap Take by mouth      Multiple Vitamins-Minerals (MULTIVITAMIN PO) Take by mouth.      simvastatin (ZOCOR) 40 MG tablet Take 40 mg by mouth nightly.          Risk & Benefits of the new medication(s) were explained to the patient (and family) who verbalized understanding & agreed to the treatment plan. Patient (family) encouraged to contact me/clinical staff with any questions/concerns      PHYSICAL EXAM     Vitals:    12/06/18 1253   BP: 121/78   Pulse: 75   Resp: 12   Temp: 97.9 F (36.6 C)   TempSrc: Oral   SpO2: 99%   Weight: 78 kg (172 lb)       No exam data present    Physical Exam  Vitals signs and nursing note reviewed.   Constitutional:       Appearance: Normal appearance. He is well-developed.   HENT:      Head: Normocephalic.      Right Ear: Hearing, tympanic membrane, ear canal and external ear normal.      Left Ear: Hearing, tympanic membrane, ear canal and external ear normal. There is impacted cerumen.      Nose: Nose normal.      Mouth/Throat:      Lips: Pink.      Pharynx: Uvula midline.      Comments: Clear PND  Eyes:      General: Lids are normal.      Pupils: Pupils are equal, round, and reactive to light.   Neck:      Musculoskeletal: Normal range of motion.   Cardiovascular:      Rate and Rhythm: Normal rate and regular rhythm.      Heart sounds: Normal heart sounds, S1 normal and S2 normal. No murmur. No gallop.    Pulmonary:  Effort: Pulmonary effort is normal. No respiratory distress.      Breath sounds: Normal breath sounds. No decreased breath sounds, wheezing or rhonchi.   Musculoskeletal: Normal range of motion.   Skin:     General: Skin is warm and dry.   Neurological:      Mental Status: He is alert and oriented to person, place, and time.   Psychiatric:         Speech: Speech normal.         Ortho Exam      PROCEDURE(S)     Procedures    ASSESSMENT/PLAN     Lab Results from today's visit:  Results     ** No results found for the last 24 hours. **           Radiology Results from today's visit:  No results found.     1. Post-viral cough syndrome  Status- acute, stable    No red flag s/s on exam. Meds as above. Push fluids, humidified air, mucinex, flonase. May continue benadryl. Discussed symptoms to seek emergent treatment. Follow up with PCP or RTC if there are any new or worsening symptoms or if the symptoms are lasting longer than expected. Patient/guardian expressed understanding and agreement with plan of care at time of discharge             Signed,  Maree Erie, FNP  [facility]  12/06/2018    This note was generated by the Epic EMR system/ Dragon speech recognition and may contain inherent errors or omissions not intended by the user. Grammatical errors, random word insertions, deletions, pronoun errors and incomplete sentences are occasional consequences of this technology due to software limitations. Not all errors are caught or corrected. If there are questions or concerns about the content of this note or information contained within the body of this dictation they should be addressed directly with the author for clarification    There are no Patient Instructions on file for this visit.

## 2018-12-06 NOTE — Progress Notes (Signed)
Patient states that she has been coughing for 3-4 weeks.  The cough is rarely productive.  He has been taking otc cough medication for his symptoms.

## 2018-12-30 ENCOUNTER — Encounter (INDEPENDENT_AMBULATORY_CARE_PROVIDER_SITE_OTHER): Payer: Self-pay

## 2018-12-30 ENCOUNTER — Ambulatory Visit (INDEPENDENT_AMBULATORY_CARE_PROVIDER_SITE_OTHER): Payer: Medicare Other | Admitting: Nurse Practitioner

## 2018-12-30 VITALS — BP 134/79 | HR 78 | Temp 98.2°F | Resp 16 | Wt 169.6 lb

## 2018-12-30 DIAGNOSIS — M62838 Other muscle spasm: Secondary | ICD-10-CM

## 2018-12-30 DIAGNOSIS — Z760 Encounter for issue of repeat prescription: Secondary | ICD-10-CM

## 2018-12-30 MED ORDER — BENZONATATE 100 MG PO CAPS
100.0000 mg | ORAL_CAPSULE | Freq: Three times a day (TID) | ORAL | 2 refills | Status: AC | PRN
Start: 2018-12-30 — End: 2019-12-30

## 2018-12-30 MED ORDER — CYCLOBENZAPRINE HCL 5 MG PO TABS
5.0000 mg | ORAL_TABLET | Freq: Three times a day (TID) | ORAL | 0 refills | Status: DC | PRN
Start: 2018-12-30 — End: 2018-12-31

## 2018-12-30 MED ORDER — FLUTICASONE PROPIONATE 50 MCG/ACT NA SUSP
2.0000 | Freq: Every day | NASAL | 2 refills | Status: AC
Start: 2018-12-30 — End: ?

## 2018-12-30 NOTE — Progress Notes (Signed)
[facility]    PROGRESS NOTE      Patient: Justin Cisneros   Date: 12/30/2018   MRN: [id number]     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    Sick sinus syndrome FEB 2005    PACEMAKER PLACED 2005, REPLACED 2014    Sleep apnea     USES BIPAP    Syncope and collapse 2005    PRIOR TO PACEMAKER INSERTION     Social History     Socioeconomic History    Marital status: Married     Spouse name: Not on file    Number of children: Not on file    Years of education: Not on file    Highest education level: Not on file   Occupational History    Not on file   Social Needs    Financial resource strain: Not on file    Food insecurity:     Worry: Not on file     Inability: Not on file    Transportation needs:     Medical: Not on file     Non-medical: Not on file   Tobacco Use    Smoking status: Former Smoker     Years: 15.00     Last attempt to quit: 11/28/2003     Years since quitting: 15.0    Smokeless tobacco: Never Used    Tobacco comment: OCC SMOKED  A CIGAR OR PIPE   Substance and Sexual Activity    Alcohol use: Yes     Alcohol/week: 21.0 standard drinks     Types: 14 Glasses of wine, 7 Shots of liquor per week    Drug use: No    Sexual activity: Not on file   Lifestyle    Physical activity:     Days per week: Not on file     Minutes per session: Not on file    Stress: Not on file   Relationships    Social connections:     Talks on phone: Not on file     Gets together: Not on file     Attends religious service: Not on file     Active member of club or organization: Not on file     Attends meetings of clubs or organizations: Not on file     Relationship status: Not on file    Intimate partner violence:     Fear of current or ex partner: Not on file     Emotionally abused: Not on file     Physically abused:  Not on file     Forced sexual activity: Not on file   Other Topics Concern    Not on file   Social History Narrative    Not on file     History reviewed. No pertinent family history.    ASSESSMENT/PLAN     Justin Cisneros is a 78 y.o. male    Chief Complaint   Patient presents with    URI     Follow Up        1. Muscle spasm  - cyclobenzaprine (FLEXERIL) 5 MG tablet; Take 1 tablet (5 mg total) by mouth 3 (three) times daily as needed for Muscle  spasms  Dispense: 30 tablet; Refill: 0    Pt advised of sedating effects of flexeril. Do not drive while taking.   Continue physical therapy, moist heat, gentle stretching when you arrived to your new location in [place] please follow-up with local primary care or orthopedist for further evaluation.    2. Encounter for medication refill  - benzonatate (TESSALON PERLES) 100 MG capsule; Take 1 capsule (100 mg total) by mouth 3 (three) times daily as needed for Cough  Dispense: 30 capsule; Refill: 2  - fluticasone (FLONASE) 50 MCG/ACT nasal spray; 2 sprays by Nasal route daily Use 2 Sprays into each nostril daily  Dispense: 9.9 mL; Refill: 2    Medications refilled, patient improving with expected pace.  Avoid Mucinex or expectorants as this has consistently caused the episodes of diarrhea.  Push fluids.  Continue humidification at home.  Consider elevation of bed while sleeping.  Please return or seek follow-up care if your symptoms acutely worsen, change or you develop new symptoms.    Patient/guardian expressed understanding and agreement with plan of care at time of discharge       No results found for this or any previous visit (from the past 24 hour(s)).      Risk & Benefits of the new medication(s) were explained to the patient (and family) who verbalized understanding & agreed to the treatment plan. Patient (family) encouraged to contact me/clinical staff with any questions/concerns      MEDICATIONS     Outpatient Medications Marked as Taking for the 12/30/18  encounter (Office Visit) with Maree Erie, FNP   Medication Sig Dispense Refill    aspirin EC 81 MG EC tablet Take 81 mg by mouth daily.      benzonatate (TESSALON PERLES) 100 MG capsule Take 1 capsule (100 mg total) by mouth 3 (three) times daily as needed for Cough 30 capsule 2    doxazosin (CARDURA) 4 MG tablet Take 4 mg by mouth nightly.      fluticasone (FLONASE) 50 MCG/ACT nasal spray 2 sprays by Nasal route daily Use 2 Sprays into each nostril daily 9.9 mL 2    losartan-hydrochlorothiazide (HYZAAR) 100-12.5 MG per tablet Take 1 tablet by mouth every evening.       Melatonin 5 MG Cap Take by mouth      Multiple Vitamins-Minerals (MULTIVITAMIN PO) Take by mouth.      simvastatin (ZOCOR) 40 MG tablet Take 40 mg by mouth nightly.       [DISCONTINUED] benzonatate (TESSALON PERLES) 100 MG capsule Take 1 capsule (100 mg total) by mouth 3 (three) times daily as needed for Cough 30 capsule 0    [DISCONTINUED] fluticasone (FLONASE) 50 MCG/ACT nasal spray 2 sprays by Nasal route daily Use 2 Sprays into each nostril daily 9.9 mL 0         No Known Allergies    SUBJECTIVE     Chief Complaint   Patient presents with    URI     Follow Up        Patient here for follow-up of walk-in visit 3 weeks ago, diagnosed with postviral cough syndrome.  Was started on Flonase, [employer], Mucinex.  Patient states that he is overall feeling much better, requesting refills of the Tessalon and Flonase prescriptions.  Cough is very minimal, no shortness of breath, afebrile.  Does have some minor residual postnasal drainage.  Patient is moving out of town in 2 weeks.    URI  This is a new problem. The current episode started 1 to 4 weeks ago. The problem has been gradually improving. There has been no fever. Associated symptoms include coughing and diarrhea. Pertinent negatives include no wheezing. He has tried decongestant (Pt reports the tessalon pearles and flonase worked very well and is requesting a refill on  both and a cough suppresant. He states he tried Guafinesin 600 mg which caused diarrhea. ) for the symptoms. The treatment provided significant relief.     Patient also has been experiencing left-sided sciatic nerve pain times several months.  Previously evaluated by orthopedics provided with 2 epidural injections without any relief.  Patient has been doing consistent physical therapy with significant interval improvements of his symptoms.  Stating that he is having some difficulty as he is now having some muscle spasms of the left buttock muscle that is aggravating his sciatic nerve.  Previously tried gabapentin from the orthopedist without any relief so he is no longer taking this.  Patient uses heat on a frequent basis that provides him with significant relief    ROS     Review of Systems   Constitutional: Negative for chills, fatigue and fever.   HENT: Positive for postnasal drip.    Respiratory: Positive for cough. Negative for chest tightness, shortness of breath and wheezing.    Gastrointestinal: Positive for diarrhea.   Allergic/Immunologic: Negative for immunocompromised state.       The following sections were reviewed this encounter by the provider:   Tobacco   Allergies   Meds   Problems   Med Hx   Surg Hx   Fam Hx          PHYSICAL EXAM     Vitals:    12/30/18 1456   BP: 134/79   BP Site: Right arm   Patient Position: Sitting   Cuff Size: Medium   Pulse: 78   Resp: 16   Temp: 98.2 F (36.8 C)   TempSrc: Oral   SpO2: 96%   Weight: 76.9 kg (169 lb 9.6 oz)       Physical Exam  Vitals signs and nursing note reviewed.   Constitutional:       Appearance: Normal appearance. He is well-developed.   HENT:      Head: Normocephalic.      Right Ear: Hearing, tympanic membrane, ear canal and external ear normal.      Left Ear: Hearing, tympanic membrane, ear canal and external ear normal.      Nose: Nose normal.      Mouth/Throat:      Lips: Pink.      Pharynx: Uvula midline.   Eyes:      General: Lids are normal.       Pupils: Pupils are equal, round, and reactive to light.   Neck:      Musculoskeletal: Normal range of motion.   Cardiovascular:      Rate and Rhythm: Normal rate and regular rhythm.      Heart sounds: Normal heart sounds, S1 normal and S2 normal. No murmur. No gallop.    Pulmonary:      Effort: Pulmonary effort is normal. No respiratory distress.      Breath sounds: Normal breath sounds. No decreased breath sounds, wheezing or rhonchi.   Musculoskeletal: Normal range of motion.   Skin:     General: Skin is warm and dry.   Neurological:      Mental Status: He is alert and oriented to person,  place, and time.   Psychiatric:         Speech: Speech normal.       Ortho Exam  Neurologic Exam     Mental Status   Oriented to person, place, and time.   Speech: speech is normal     Cranial Nerves     CN III, IV, VI   Pupils are equal, round, and reactive to light.      PROCEDURE(S)     Procedures        Signed,  Maree Erie, FNP  12/30/2018

## 2018-12-31 ENCOUNTER — Telehealth (INDEPENDENT_AMBULATORY_CARE_PROVIDER_SITE_OTHER): Payer: Self-pay | Admitting: Nurse Practitioner

## 2018-12-31 ENCOUNTER — Other Ambulatory Visit (INDEPENDENT_AMBULATORY_CARE_PROVIDER_SITE_OTHER): Payer: Self-pay | Admitting: Nurse Practitioner

## 2018-12-31 DIAGNOSIS — M62838 Other muscle spasm: Secondary | ICD-10-CM

## 2018-12-31 NOTE — Telephone Encounter (Signed)
Patient wants to know if ok to get additional refills on cyclobenzaprine (FLEXERIL) 5 MG tablet    Pt states med really helps and wants to know if can get additional refills    Preferred Pharmacy:    [facility] # [address] [place] - [address]    [phone] (Phone)  [phone] (Fax)    Pt contact: [phone]    Pt states is very thankful for FNP prescribing med, it is really helpful with herniated disc pain

## 2018-12-31 NOTE — Telephone Encounter (Signed)
Done. thanks

## 2019-01-03 ENCOUNTER — Ambulatory Visit (INDEPENDENT_AMBULATORY_CARE_PROVIDER_SITE_OTHER): Payer: Self-pay

## 2019-01-08 ENCOUNTER — Encounter (INDEPENDENT_AMBULATORY_CARE_PROVIDER_SITE_OTHER): Payer: Self-pay

## 2019-02-02 ENCOUNTER — Encounter (INDEPENDENT_AMBULATORY_CARE_PROVIDER_SITE_OTHER): Payer: Self-pay

## 2019-03-05 ENCOUNTER — Encounter (INDEPENDENT_AMBULATORY_CARE_PROVIDER_SITE_OTHER): Payer: Self-pay

## 2019-06-05 ENCOUNTER — Telehealth: Payer: Self-pay | Admitting: Internal Medicine

## 2019-06-05 NOTE — Telephone Encounter (Signed)
New message:    Patient calling trying to get a appt with Dr. Caryl Comes he was referred by [employer]. He spoke with Felicia on 73/71/06 she told him to come to the office to do a release forum and spoke with Hymera on 05/16/19. Patient states that he has not heard anything back. The records are from Dr. Michel Harrow In [place]. Phone number [phone] and fax [phone]. Please call patient to discuss getting him appt soon.

## 2019-06-17 ENCOUNTER — Encounter: Payer: Self-pay | Admitting: Internal Medicine

## 2019-06-24 ENCOUNTER — Telehealth: Payer: Self-pay | Admitting: Internal Medicine

## 2019-06-24 NOTE — Telephone Encounter (Signed)

## 2019-06-25 ENCOUNTER — Encounter: Payer: Self-pay | Admitting: Internal Medicine

## 2019-06-25 ENCOUNTER — Other Ambulatory Visit: Payer: Self-pay | Admitting: Orthopedic Surgery

## 2019-06-25 ENCOUNTER — Other Ambulatory Visit: Payer: Self-pay

## 2019-06-25 ENCOUNTER — Ambulatory Visit (INDEPENDENT_AMBULATORY_CARE_PROVIDER_SITE_OTHER): Payer: Medicare Other | Admitting: Internal Medicine

## 2019-06-25 ENCOUNTER — Ambulatory Visit (INDEPENDENT_AMBULATORY_CARE_PROVIDER_SITE_OTHER): Payer: Self-pay

## 2019-06-25 VITALS — BP 124/78 | HR 71 | Ht 69.0 in | Wt 176.4 lb

## 2019-06-25 DIAGNOSIS — Z950 Presence of cardiac pacemaker: Secondary | ICD-10-CM

## 2019-06-25 DIAGNOSIS — I442 Atrioventricular block, complete: Secondary | ICD-10-CM

## 2019-06-25 DIAGNOSIS — G8929 Other chronic pain: Secondary | ICD-10-CM

## 2019-06-25 LAB — CUP PACEART INCLINIC DEVICE CHECK
Battery Impedance: 1298 Ohm
Battery Remaining Longevity: 45 mo
Battery Voltage: 2.76 V
Brady Statistic AP VP Percent: 15 %
Brady Statistic AP VS Percent: 0 %
Brady Statistic AS VP Percent: 84 %
Brady Statistic AS VS Percent: 0 %
Date Time Interrogation Session: 20200729155806
Implantable Lead Implant Date: 20050208
Implantable Lead Implant Date: 20050208
Implantable Lead Location: 753859
Implantable Lead Location: 753860
Implantable Lead Model: 5076
Implantable Lead Model: 5076
Implantable Pulse Generator Implant Date: 20131008
Lead Channel Impedance Value: 430 Ohm
Lead Channel Impedance Value: 509 Ohm
Lead Channel Pacing Threshold Amplitude: 0.75 V
Lead Channel Pacing Threshold Amplitude: 1.25 V
Lead Channel Pacing Threshold Pulse Width: 0.4 ms
Lead Channel Pacing Threshold Pulse Width: 0.52 ms
Lead Channel Sensing Intrinsic Amplitude: 4 mV
Lead Channel Setting Pacing Amplitude: 1.75 V
Lead Channel Setting Pacing Amplitude: 2.5 V
Lead Channel Setting Pacing Pulse Width: 0.52 ms
Lead Channel Setting Sensing Sensitivity: 4 mV

## 2019-06-25 NOTE — Patient Instructions (Addendum)
Medication Instructions:  Your physician recommends that you continue on your current medications as directed. Please refer to the Current Medication list given to you today.  Labwork: None ordered.  Testing/Procedures: None ordered.  Follow-Up:  Follow up with Dr. Caryl Comes on Dec 16 2019 @ 2:15pm  Any Other Special Instructions Will Be Listed Below (If Applicable).     If you need a refill on your cardiac medications before your next appointment, please call your pharmacy.

## 2019-06-25 NOTE — Progress Notes (Signed)
ELECTROPHYSIOLOGY OFFICE  NOTE  Patient ID: Jimmy Palmer, MRN: [id number], DOB/AGE: Oct 03, 1941 78 y.o. Admit date: (Not on file) Date of Consult: 06/25/2019  Primary Physician: Patient, No Pcp Per Primary Cardiologist: new Jimmy Palmer is a 78 y.o. male who is being seen today for the evaluation of pacemaker previously inserted in [place].   Chief Complaint: new   HPI Jimmy Mcarthur RossettiJames Luviano is a 78 y.o. 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  He had a history of syncope.  No subsequent syncope  The patient denies chest pain, shortness of breath, nocturnal dyspnea, orthopnea or peripheral edema.  There have been no palpitations, lightheadedness or syncope.    He has sleep apnea for which  he is not treated.  Denies fatigue.  Date Cr K Hgb  9/19 1.08 4.2             DATE TEST EF   8/14 Echo   60 %   4/16 MYOVIEW   56 %  normal perfusion        He comes in effusive in his praise, also telling me stories of his many many many years with the [employer].  [place] has moved to [place] as his daughter lives here.  Past Medical History:  Diagnosis Date  . Abnormal electrocardiogram   . Abnormal heart sounds   . Herniated lumbar intervertebral disc   . Hypertension   . Left bundle branch block   . Pacemaker   . Pure hypercholesterolemia   . Sick sinus syndrome (HCC)   . Sleep apnea   . VT (ventricular tachycardia) [facility](HCC)       Surgical History:  Past Surgical History:  Procedure Laterality Date  . INSERT / REPLACE / REMOVE PACEMAKER    . REPLACEMENT TOTAL KNEE    . VASECTOMY       Home Meds: Prior to Admission medications   Medication Sig Start Date End Date Taking? Authorizing Provider  aspirin EC 81 MG tablet Take 81 mg by mouth daily.   Yes [provider]  diphenhydrAMINE (BENADRYL) 25 mg capsule Take 25 mg by mouth every 6 (six)  hours as needed.   Yes [provider]  doxazosin (CARDURA) 4 MG tablet Take 4 mg by mouth daily.   Yes [provider]  gabapentin (NEURONTIN) 300 MG capsule Take 300 mg by mouth 3 (three) times daily.   Yes [provider]  losartan-hydrochlorothiazide (HYZAAR) 100-12.5 MG tablet Take 1 tablet by mouth daily.   Yes [provider]  Melatonin 5 MG TABS Take 5 mg by mouth.   Yes [provider]  Multiple Vitamin (MULTIVITAMIN PO) Take by mouth.   Yes [provider]  simvastatin (ZOCOR) 40 MG tablet Take 40 mg by mouth daily.   Yes [provider]     Allergies: No Known Allergies  Social History   Socioeconomic History  . Marital status: Married    Spouse name: Not on file  . Number of children: Not on file  . Years of education: Not on file  . Highest education level: Not on file  Occupational History  . Not on file  Social Needs  . Financial resource strain: Not on file  . Food insecurity    Worry: Not on file    Inability: Not on file  . Transportation needs  Medical: Not on file    Non-medical: Not on file  Tobacco Use  . Smoking status: Former Games developermoker  . Smokeless tobacco: Never Used  Substance and Sexual Activity  . Alcohol use: Yes  . Drug use: Not on file  . Sexual activity: Not on file  Lifestyle  . Physical activity    Days per week: Not on file    Minutes per session: Not on file  . Stress: Not on file  Relationships  . Social Musicianconnections    Talks on phone: Not on file    Gets together: Not on file    Attends religious service: Not on file    Active member of club or organization: Not on file    Attends meetings of clubs or organizations: Not on file    Relationship status: Not on file  . Intimate partner violence    Fear of current or ex partner: Not on file    Emotionally abused: Not on file    Physically abused: Not on file    Forced sexual activity: Not on file  Other Topics Concern   . Not on file  Social History Narrative  . Not on file     History reviewed. No pertinent family history.   ROS:  Please see the history of present illness.     All other systems reviewed and negative.    Physical Exam: Blood pressure 124/78, pulse 71, height 5\' 9"  (1.753 m), weight 176 lb 6.4 oz (80 kg), SpO2 95 %. General: Well developed, well nourished male in no acute distress. Head: Normocephalic, atraumatic, sclera non-icteric, no xanthomas, nares are without discharge. EENT: normal Lymph Nodes:  none Back: without scoliosis/kyphosis , no CVA tendersness Neck: Negative for carotid bruits. JVD not elevated. Lungs: Clear bilaterally to auscultation without wheezes, rales, or rhonchi. Breathing is unlabored. Device pocket well healed; without hematoma or erythema.  There is some tethering Heart: RRR with S1 S2. No murmur , rubs, or gallops appreciated. Abdomen: Soft, non-tender, non-distended with normoactive bowel sounds. No hepatomegaly. No rebound/guarding. No obvious abdominal masses. Msk:  Strength and tone appear normal for age. Extremities: No clubbing or cyanosis.  edema.  Distal pedal pulses are 2+ and equal bilaterally. Skin: Warm and Dry Neuro: Alert and oriented X 3. CN III-XII intact Grossly normal sensory and motor function . Psych:  Responds to questions appropriately with a normal affect.      Labs: Cardiac Enzymes No results for input(s): CKTOTAL, CKMB, TROPONINI in the last 72 hours. CBC No results found for: WBC, HGB, HCT, MCV, PLT PROTIME: No results for input(s): LABPROT, INR in the last 72 hours. Chemistry No results for input(s): NA, K, CL, CO2, BUN, CREATININE, CALCIUM, PROT, BILITOT, ALKPHOS, ALT, AST, GLUCOSE in the last 168 hours.  Invalid input(s): LABALBU Lipids No results found for: CHOL, HDL, LDLCALC, TRIG BNP No results found for: PROBNP Thyroid Function Tests: No results for input(s): TSH, T4TOTAL, T3FREE, THYROIDAB in the last 72  hours.  Invalid input(s): FREET3    Miscellaneous No results found for: DDIMER  Radiology/Studies:  No results found.  EKG: Sinus with P synchronous pacing   Assessment and Plan:   Complete heart block  Pacemaker [employer]   Atrial tach  Pocket Tethering   The patient's device is working normally.  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  The device was reprogrammed to get the RV outputs under 2.5 V by increasing the pulse  width.  This may improve longevity.  There is tethering over the medial aspect of his incision.  There is no erythema.  I have reviewed with him the possibility that this represents an infection that might result in erosion and the need for device generator extraction.  We will see him again in 6 months.  He is advised to let us know if there are new problems.   Virl Axe

## 2019-06-27 ENCOUNTER — Other Ambulatory Visit: Payer: Self-pay

## 2019-12-16 ENCOUNTER — Other Ambulatory Visit: Payer: Self-pay

## 2019-12-16 ENCOUNTER — Encounter: Payer: Self-pay | Admitting: Internal Medicine

## 2019-12-16 ENCOUNTER — Ambulatory Visit (INDEPENDENT_AMBULATORY_CARE_PROVIDER_SITE_OTHER): Payer: Medicare Other | Admitting: Internal Medicine

## 2019-12-16 VITALS — BP 148/78 | HR 78 | Ht 69.0 in | Wt 176.6 lb

## 2019-12-16 DIAGNOSIS — Z950 Presence of cardiac pacemaker: Secondary | ICD-10-CM | POA: Diagnosis not present

## 2019-12-16 DIAGNOSIS — I442 Atrioventricular block, complete: Secondary | ICD-10-CM

## 2019-12-16 DIAGNOSIS — I471 Supraventricular tachycardia: Secondary | ICD-10-CM | POA: Diagnosis not present

## 2019-12-16 MED ORDER — APIXABAN 5 MG PO TABS: 5.0000 mg | ORAL_TABLET | Freq: Two times a day (BID) | ORAL | 3 refills | Status: DC

## 2019-12-16 NOTE — Progress Notes (Signed)
ELECTROPHYSIOLOGY OFFICE  NOTE  Patient ID: Jimmy Palmer, MRN: [id number], DOB/AGE: 1941/09/04 79 y.o. Admit date: (Not on file) Date of Consult: 12/16/2019  Primary Physician: Jimmy Palmer Primary Cardiologist: Jimmy Palmer is a 79 y.o. 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  He had a history of syncope.   The patient denies chest pain, shortness of breath, nocturnal dyspnea, orthopnea or peripheral edema.  There have been no palpitations , lightheadedness or syncope.    Date Cr K Hgb  9/19 1.08 4.2             DATE TEST EF   8/14 Echo   60 %   4/16 MYOVIEW   56 %  normal perfusion        Thromboembolic risk factors ( age  -2, HTN-1) for a CHADSVASc Score of >=3  He drinks regularly.  Admitting to 1 cocktail and 2 glasses of wine regularly in the evenings.  When asked if this is an under exaggeration he simply smiled.  Acknowledges that people at home have been concerned about him drinking too much.  He comes in with effusive praise about his wife who worked in healthcare for many many years and has given 100s of keynote addresses    Past Medical History:  Diagnosis Date  . Abnormal electrocardiogram   . Abnormal heart sounds   . Herniated lumbar intervertebral disc   . Hypertension   . Left bundle branch block   . Pacemaker   . Pure hypercholesterolemia   . Sick sinus syndrome ([place])   . Sleep apnea   . VT (ventricular tachycardia) [facility])       Surgical History:  Past Surgical History:  Procedure Laterality Date  . INSERT / REPLACE / REMOVE PACEMAKER    . REPLACEMENT TOTAL KNEE    . VASECTOMY       Home Meds: Prior to Admission medications   Medication Sig Start Date End Date Taking? Authorizing Provider  aspirin EC 81 MG tablet Take 81 mg by mouth  daily.   Yes [provider]  diphenhydrAMINE (BENADRYL) 25 mg capsule Take 25 mg by mouth every 6 (six) hours as needed.   Yes [provider]  doxazosin (CARDURA) 4 MG tablet Take 4 mg by mouth daily.   Yes [provider]  gabapentin (NEURONTIN) 300 MG capsule Take 300 mg by mouth 3 (three) times daily.   Yes [provider]  losartan-hydrochlorothiazide (HYZAAR) 100-12.5 MG tablet Take 1 tablet by mouth daily.   Yes [provider]  Melatonin 5 MG TABS Take 5 mg by mouth.   Yes [provider]  Multiple Vitamin (MULTIVITAMIN PO) Take by mouth.   Yes [provider]  simvastatin (ZOCOR) 40 MG tablet Take 40 mg by mouth daily.   Yes [provider]     Allergies: No Known Allergies  Social History   Socioeconomic History  . Marital status: Married    Spouse name: Not on file  . Number of children: Not on file  . Years of education: Not on file  . Highest education level: Not on file  Occupational History  . Not on file  Tobacco Use  . Smoking status: Former Research scientist (life sciences)  . Smokeless tobacco: Never Used  Substance and Sexual Activity  . Alcohol use: Yes  . Drug use: Not on file  . Sexual activity: Not on file  Other Topics Concern  . Not on file  Social History Narrative  . Not on file   Social Determinants of Health   Financial Resource Strain:   . Difficulty of Paying Living Expenses: Not on file  Food Insecurity:   . Worried About Programme researcher, broadcasting/film/video in the Last Year: Not on file  . Ran Out of Food in the Last Year: Not on file  Transportation Needs:   . Lack of Transportation (Medical): Not on file  . Lack of Transportation (Non-Medical): Not on file  Physical Activity:   . Days of Exercise Palmer Week: Not on file  . Minutes of Exercise Palmer Session: Not on file  Stress:   . Feeling of Stress : Not on file  Social Connections:   . Frequency of Communication with Friends and Family: Not on file  .  Frequency of Social Gatherings with Friends and Family: Not on file  . Attends Religious Services: Not on file  . Active Member of Clubs or Organizations: Not on file  . Attends Banker Meetings: Not on file  . Marital Status: Not on file  Intimate Partner Violence:   . Fear of Current or Ex-Partner: Not on file  . Emotionally Abused: Not on file  . Physically Abused: Not on file  . Sexually Abused: Not on file     History reviewed. No pertinent family history.   ROS:  Please see the history of present illness.     All other systems reviewed and negative.   BP (!) 148/78   Pulse 78   Ht 5\' 9"  (1.753 m)   Wt 176 lb 9.6 oz (80.1 kg)   SpO2 99%   BMI 26.08 kg/m   Well developed and well nourished in no acute distress HENT normal Neck supple with JVP-flat Clear Device pocket well healed; without hematoma or erythema.  There is  tethering at the medial aspect of the wound Regular rate and rhythm, no  gallop No  murmur Abd-soft with active BS No Clubbing cyanosis  edema Skin-warm and dry A & Oriented  Grossly normal sensory and motor function  ECG sinus with P-synchronous/ AV  pacing    Assessment and Plan:   Complete heart block  Pacemaker Medtronic The patient's device was interrogated.  The information was reviewed. No changes were made in the programming.     Atrial tach  Pocket Tethering   Atrial fibrillation paroxysmal  ETOH use Normal device function.  No interval syncope.  Noted to have recurrent prolonged episodes of atrial fibrillation 1 about 4 days, 1 about 20 days.  Priorities for the treatment of atrial fibrillation (#1 anticoagulation/stroke-prevention, #2 adequate rate control, and #3 considerations of rhythm control) were reviewed with questions answered. Risks, benefits, and alternatives to various diagnostic and treatment strategies were reviewed. Diagnostic considerations to exclude contributing factors such as thyroid disease and/ or  obstructive sleep apnea, alcohol/hypertension/obesity were reviewed.  Treatment priorities with focus on assessment of thromboembolic risk (CHADS vs. CHADSvasc) and treatment options, adequate rate control (atrioventricular nodal blockade), and rhythm control considerations (based on symptom profile and antiarrhythmic candidacy) were reviewed including antiarrhythmic drugs with risk of proarrhythmia, atrial fibrillation ablation and permanent pacemaker implantation with atrioventricular node ablation (including discussion of risks and implications of permanent pacemaker and pacemaker dependent status).   We discussed the benefits and  risks of anticoagulation, specifically the lack of expectancy of aspirin, the comparable major bleeding risks associated with Eliquis based on the Averroes trial and the effectiveness of stroke reduction associated with Eliquis based on Aristotle.  He says his wife is a cardiac nurse.  I given him copies of his electrograms.  His tethering remains stable.  There is no overlying erythema.  I remain concerned about infection.  We also had a lengthy discussion regarding his EtOH use, which some have been concerned is excessive and the admitted 3+ drinks a day is commonly appreciated to be an underestimate a point that I meet with him.  Have encouraged marked reduction in intake and consideration of discussions with those around him as to its importance.  More than 50% of 40 min was spent in counseling related to the above      Sherryl Manges

## 2019-12-16 NOTE — Patient Instructions (Signed)
Medication Instructions:  Your physician has recommended you make the following change in your medication:   Stop your Aspirin  Start Eliquis 5mg  by mouth twice daily    Labwork: None ordered.  Testing/Procedures: None ordered.  Follow-Up: Your physician wants you to follow-up in: 6 months. You will receive a reminder letter in the mail two months in advance. If you don't receive a letter, please call our office to schedule the follow-up appointment.  Remote monitoring is used to monitor your Pacemaker of ICD from home. This monitoring reduces the number of office visits required to check your device to one time per year. It allows to keep an eye on the functioning of your device to ensure it is working properly.   Any Other Special Instructions Will Be Listed Below (If Applicable).  If you need a refill on your cardiac medications before your next appointment, please call your pharmacy.

## 2019-12-17 ENCOUNTER — Telehealth: Payer: Self-pay

## 2019-12-17 DIAGNOSIS — I4891 Unspecified atrial fibrillation: Secondary | ICD-10-CM

## 2019-12-17 NOTE — Telephone Encounter (Signed)
I started an Eliquis PA through covermymeds. Key: [id number]

## 2019-12-18 ENCOUNTER — Telehealth: Payer: Self-pay | Admitting: Internal Medicine

## 2019-12-18 DIAGNOSIS — I4891 Unspecified atrial fibrillation: Secondary | ICD-10-CM

## 2019-12-18 NOTE — Telephone Encounter (Signed)
**Note De-Identified Loreley Schwall Obfuscation** Message received from covermymeds: Mcarthur Rossetti Key: [id number] - PA Case ID: [id number] Outcome  Approved on January 20  Case Id:[id number];Status:Approved;Review Type:Prior Auth Coverage Start Date:11/17/2019;Coverage End Date:12/16/2020  DrugEliquis 5MG  tablets  Form [employer] Electronic PA Form

## 2019-12-18 NOTE — Telephone Encounter (Signed)
  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 Please call when orders placed so he can schedule labs.

## 2019-12-18 NOTE — Telephone Encounter (Signed)
[name], LPN, the pt called stating that his Eliquis 5 mg tablet was to expensive to get and that he needed his medication to be changed. I saw that you had already sent in a prior auth for this medication. I explained to the pt what had been done already and that we were waiting for a response concerning this matter. I asked the pt if he was out of the medication and pt stated that he never got the medication because it was to expensive. Dr. Graciela Husbands did give him a new start for Eliquis 5 mg tablet, so I'm leaving pt a month supply of Eliquis 5 mg tablet for pt to pick up. Pt would like for you to call him back at [phone] cell phone, because he has further questions. Please address thanks

## 2019-12-18 NOTE — Telephone Encounter (Signed)
**Note De-Identified Edelmira Gallogly Obfuscation** I called the pt and he states that his pharmacy told him that his Eliquis will cost him $300 for a 45 day supply(?) I asked if he has a deductible and he stated that he does not know but that he hs never had to pay a deductible for medications in the past.  I advised him to call his insurance company and ask if he has a yearly deductible to meet and if so how much Eliquis will cost him once his deductible is met. I also advised him to ask them which anticoagulant is on their preferred list on covered medications.  I did discuss the generic Warfarin with him which would be less expensive but also to most pts it is less desired due to monthly INR Checks.  After a long discussion the pt agreed he should contact his INS plan with questions that I cannot answer. He states that he will call me back if he has further questions.

## 2019-12-19 NOTE — Telephone Encounter (Signed)
**Note De-Identified Harini Dearmond Obfuscation** The pt states that he wants switch to Coumadin as Elquis will cost him $300/90 day supply and since Coumadin has a generic Warfarin it is much less expensive.  He was advised at his OV on 12/16/2019 to start taking Eliquis but has not started due to cost.  He is advised that I am forwarding this note to Dr Graciela Husbands and his nurse for advisement to the pt.

## 2019-12-22 DIAGNOSIS — I4891 Unspecified atrial fibrillation: Secondary | ICD-10-CM | POA: Insufficient documentation

## 2019-12-22 DIAGNOSIS — I4819 Other persistent atrial fibrillation: Secondary | ICD-10-CM | POA: Insufficient documentation

## 2019-12-22 MED ORDER — WARFARIN SODIUM 5 MG PO TABS: 5.0000 mg | ORAL_TABLET | Freq: Every day | ORAL | 0 refills | Status: DC

## 2019-12-22 NOTE — Addendum Note (Signed)
Addended by: Alois Cliche on: 12/22/2019 03:18 PM   Modules accepted: Orders

## 2019-12-22 NOTE — Telephone Encounter (Signed)
Called patient to schedule coumadin clinic appointment. Scheduled for Monday at 2:30. Warfarin 5mg  daily sent to pharmacy. Requested patient start tomorrow. Patient requested we call tomorrow as a reminder so he can write down appointment as he is driving now. All questions answered.

## 2019-12-22 NOTE — Telephone Encounter (Signed)
Per Dr Graciela Husbands refer pt to [facility].  Spoke with pt and advised of referral to begin anticoagulation manangment of his Afib.  Pt verbalizes understanding and agrees with plan.  Order placed for referral.

## 2019-12-23 MED ORDER — APIXABAN 5 MG PO TABS: 5.0000 mg | ORAL_TABLET | Freq: Two times a day (BID) | ORAL | 5 refills | Status: DC

## 2019-12-23 NOTE — Addendum Note (Signed)
Addended by: SUPPLE, MEGAN E on: 12/23/2019 10:34 AM   Modules accepted: Orders

## 2019-12-23 NOTE — Telephone Encounter (Signed)
Pt has Nurse, learning disability, we can use a $10 copay card for Eliquis so he does not need to start warfarin.  Activated copay card, sent it to pharmacy and confirmed $10 copay. Pt is aware and was very grateful. INR check canceled. Ordered BMET and CBC for baseline labs - pt states he will call back to let us know what day he can come in for labs.

## 2019-12-26 ENCOUNTER — Other Ambulatory Visit: Payer: Medicare Other | Admitting: *Deleted

## 2019-12-26 ENCOUNTER — Other Ambulatory Visit: Payer: Self-pay

## 2019-12-26 DIAGNOSIS — I4891 Unspecified atrial fibrillation: Secondary | ICD-10-CM

## 2019-12-27 LAB — CBC
Hematocrit: 40.8 % (ref 37.5–51.0)
Hemoglobin: 13.6 g/dL (ref 13.0–17.7)
MCH: 31.1 pg (ref 26.6–33.0)
MCHC: 33.3 g/dL (ref 31.5–35.7)
MCV: 93 fL (ref 79–97)
Platelets: 255 10*3/uL (ref 150–450)
RBC: 4.38 x10E6/uL (ref 4.14–5.80)
RDW: 11.1 % — ABNORMAL LOW (ref 11.6–15.4)
WBC: 10.7 10*3/uL (ref 3.4–10.8)

## 2019-12-27 LAB — BASIC METABOLIC PANEL
BUN/Creatinine Ratio: 18 (ref 10–24)
BUN: 24 mg/dL (ref 8–27)
CO2: 23 mmol/L (ref 20–29)
Calcium: 9.5 mg/dL (ref 8.6–10.2)
Chloride: 102 mmol/L (ref 96–106)
Creatinine, Ser: 1.33 mg/dL — ABNORMAL HIGH (ref 0.76–1.27)
GFR calc Af Amer: 60 mL/min/{1.73_m2} (ref >59)
GFR calc non Af Amer: 52 mL/min/{1.73_m2} — ABNORMAL LOW (ref >59)
Glucose: 96 mg/dL (ref 65–99)
Potassium: 4.5 mmol/L (ref 3.5–5.2)
Sodium: 138 mmol/L (ref 134–144)

## 2020-01-07 ENCOUNTER — Telehealth: Payer: Self-pay | Admitting: *Deleted

## 2020-01-07 NOTE — Telephone Encounter (Signed)
   [facility] Pre-operative Risk Assessment    Request for surgical clearance:  1. What type of surgery is being performed? LEFT HIP INTRA-ARTICULAR INJECTION   2. When is this surgery scheduled? 01/12/20   3. What type of clearance is required (medical clearance vs. Pharmacy clearance to hold med vs. Both)? BOTH  4. Are there any medications that need to be held prior to surgery and how long? ELIQUIS X 3 DAYS PRIOR TO INJECTION   5. Practice name and name of physician performing surgery? [place]; DR. PAUL HARKINS   6. What is your office phone number [phone]    7.   What is your office fax number [phone]  8.   Anesthesia type (None, local, MAC, general) ? NONE LISTED    Jimmy Palmer 01/07/2020, 2:23 PM  _________________________________________________________________   (provider comments below)

## 2020-01-07 NOTE — Telephone Encounter (Signed)
Patient with diagnosis of atrial fibrillation on Eliquis for anticoagulation.    Procedure: left hip intra-articular injection Date of procedure: 01/12/20  CHADS2-VASc score of  3 (HTN, AGE x 2)  CrCl 53.5 Platelet count 255  Per office protocol, patient can hold Eliquis for 3 days prior to procedure.    Patient will not need bridging with Lovenox (enoxaparin) around procedure.

## 2020-01-08 NOTE — Telephone Encounter (Signed)
   Primary Cardiologist: Sherryl Manges, MD  Chart reviewed as part of pre-operative protocol coverage. Given past medical history and time since last visit, based on ACC/AHA guidelines, T Davione Lenker would be at acceptable risk for the planned procedure without further cardiovascular testing.   His RCRI is a class II risk, 0.9% risk of major cardiac event.  Procedure: left hip intra-articular injection Date of procedure: 01/12/20  CHADS2-VASc score of  3 (HTN, AGE x 2)  CrCl 53.5 Platelet count 255  Per office protocol, patient can hold Eliquis for 3 days prior to procedure.    Patient will not need bridging with Lovenox (enoxaparin) around procedure.  I will route this recommendation to the requesting party via Epic fax function and remove from pre-op pool.  Please call with questions.  Thomasene Ripple. Samaira Holzworth NP-C     [facility] [address] Office [phone] Fax [phone]

## 2020-01-09 ENCOUNTER — Encounter: Payer: Self-pay | Admitting: Internal Medicine

## 2020-01-09 NOTE — Telephone Encounter (Signed)
error 

## 2020-01-09 NOTE — Telephone Encounter (Addendum)
Patient calling wanting to know if he needs to stop taking his Eliquis prior to injection. States he never heard anything from the provider doing the injection.

## 2020-01-12 ENCOUNTER — Telehealth: Payer: Self-pay | Admitting: Internal Medicine

## 2020-01-12 NOTE — Telephone Encounter (Signed)
Hi Nettie Elm- looks like this patient was cleared on 2/11- I sent it again  Corine Shelter PA-C 01/12/2020 3:17 PM

## 2020-01-12 NOTE — Telephone Encounter (Signed)
Follow Up:   Jimmy Palmer from Dr Ollen Bowl office called. She need pt's clearance asap, he is scheduled for today. Please fax asap to [phone] [id number].;

## 2020-01-18 ENCOUNTER — Ambulatory Visit: Payer: Medicare Other | Attending: Internal Medicine

## 2020-01-18 DIAGNOSIS — Z23 Encounter for immunization: Secondary | ICD-10-CM | POA: Insufficient documentation

## 2020-01-18 NOTE — Progress Notes (Signed)
   Covid-19 Vaccination Clinic  Name:  Jimmy Palmer    MRN: [id number] DOB: May 23, 1941  01/18/2020  Mr. Loss was observed post Covid-19 immunization for 15 minutes without incidence. He was provided with Vaccine Information Sheet and instruction to access the V-Safe system.   [name] was instructed to call 911 with any severe reactions post vaccine: Marland Kitchen Difficulty breathing  . Swelling of your face and throat  . A fast heartbeat  . A bad rash all over your body  . Dizziness and weakness    Immunizations Administered    Name Date Dose VIS Date Route   Pfizer COVID-19 Vaccine 01/18/2020  3:17 PM 0.3 mL 11/07/2019 Intramuscular   Manufacturer: [employer], [employer]   Lot: [id number]   NDC: [address]-5

## 2020-01-26 ENCOUNTER — Ambulatory Visit: Payer: Medicare Other | Admitting: Internal Medicine

## 2020-02-11 ENCOUNTER — Ambulatory Visit: Payer: Medicare Other | Attending: Internal Medicine

## 2020-02-11 DIAGNOSIS — Z23 Encounter for immunization: Secondary | ICD-10-CM

## 2020-02-11 NOTE — Progress Notes (Signed)
   Covid-19 Vaccination Clinic  Name:  Jimmy Palmer    MRN: [id number] DOB: 1941/05/30  02/11/2020  [name] was observed post Covid-19 immunization for 15 minutes without incident. He was provided with Vaccine Information Sheet and instruction to access the V-Safe system.   [name] was instructed to call 911 with any severe reactions post vaccine: Marland Kitchen Difficulty breathing  . Swelling of face and throat  . A fast heartbeat  . A bad rash all over body  . Dizziness and weakness   Immunizations Administered    Name Date Dose VIS Date Route   Pfizer COVID-19 Vaccine 02/11/2020  1:26 PM 0.3 mL 11/07/2019 Intramuscular   Manufacturer: [employer], [employer]   Lot: [id number]   NDC: [address]-0

## 2020-02-27 ENCOUNTER — Other Ambulatory Visit: Payer: Self-pay

## 2020-02-27 MED ORDER — LOSARTAN POTASSIUM-HCTZ 100-12.5 MG PO TABS: 1.0000 | ORAL_TABLET | Freq: Every day | ORAL | 3 refills | Status: DC

## 2020-03-19 ENCOUNTER — Other Ambulatory Visit: Payer: Self-pay

## 2020-03-19 MED ORDER — SIMVASTATIN 40 MG PO TABS: 40.0000 mg | ORAL_TABLET | Freq: Every day | ORAL | 3 refills | Status: DC

## 2020-03-19 NOTE — Telephone Encounter (Signed)
[facility] pharmacy is requesting a refill on pt's medication simvastatin. Would Dr. Graciela Husbands like to refill this medication? Please address

## 2020-04-28 LAB — LIPID PANEL, WITHOUT TOTAL CHOLESTEROL/HDL RATIO, SERUM
Cholesterol: 166 mg/dL (ref 100–199)
HDL: 76 mg/dL (ref >39)
LDL Calculated: 73 mg/dL (ref 0–99)
Triglycerides: 84 mg/dL (ref 0–149)
VLDL Calculated: 17 mg/dL (ref 5–40)

## 2020-04-28 LAB — COMPREHENSIVE METABOLIC PANEL
ALT: 15 IU/L (ref 0–44)
AST (SGOT): 17 IU/L (ref 0–40)
African American eGFR: 77 mL/min/{1.73_m2} (ref >59)
Albumin/Globulin Ratio: 1.4 (ref 1.2–2.2)
Albumin: 4.2 g/dL (ref 3.5–4.8)
Alkaline Phosphatase: 99 IU/L (ref 39–117)
BUN / Creatinine Ratio: 15 (ref 10–24)
BUN: 16 mg/dL (ref 8–27)
Bilirubin, Total: 0.4 mg/dL (ref 0.0–1.2)
CO2: 23 mmol/L (ref 20–29)
Calcium: 9.5 mg/dL (ref 8.6–10.2)
Chloride: 96 mmol/L (ref 96–106)
Creatinine: 1.08 mg/dL (ref 0.76–1.27)
Globulin, Total: 2.9 g/dL (ref 1.5–4.5)
Glucose: 110 mg/dL — ABNORMAL HIGH (ref 65–99)
Potassium: 4.9 mmol/L (ref 3.5–5.2)
Protein, Total: 7.1 g/dL (ref 6.0–8.5)
Sodium: 134 mmol/L (ref 134–144)
non-African American eGFR: 67 mL/min/{1.73_m2} (ref >59)

## 2020-04-28 LAB — SPECIMEN STATUS REPORT

## 2020-06-11 ENCOUNTER — Telehealth: Payer: Self-pay | Admitting: Internal Medicine

## 2020-06-11 NOTE — Telephone Encounter (Signed)
Jimmy Palmer is calling wanting to know if Dr. Graciela Husbands would like for him to have labs performed prior to his appt. He states if so he would like to get them done through [facility]. Please advise.

## 2020-06-15 ENCOUNTER — Encounter: Payer: Medicare Other | Admitting: Internal Medicine

## 2020-06-17 NOTE — Telephone Encounter (Signed)
Attempted phone call to pt.  OK to leave detailed voicemail per chart.  Pt advised typically Dr Graciela Husbands does not order labs ahead of time but rather they are ordered while pt is in office for appointment.  Pt advised if there is lab work her is needing specifically to let RN know.Please call RN with any further questions [phone].

## 2020-06-21 ENCOUNTER — Other Ambulatory Visit: Payer: Self-pay | Admitting: Internal Medicine

## 2020-06-21 NOTE — Telephone Encounter (Signed)
Prescription refill request for Eliquis received.  Last office visit: Jimmy Palmer 12/16/2019 Scr: 1.33, 12/26/2019 Age: 79 y.o. Weight: 80.1 kg  Prescription refill sent.

## 2020-08-26 DIAGNOSIS — I442 Atrioventricular block, complete: Secondary | ICD-10-CM | POA: Insufficient documentation

## 2020-08-26 DIAGNOSIS — I471 Supraventricular tachycardia: Secondary | ICD-10-CM | POA: Insufficient documentation

## 2020-08-26 DIAGNOSIS — Z950 Presence of cardiac pacemaker: Secondary | ICD-10-CM | POA: Insufficient documentation

## 2020-08-26 DIAGNOSIS — I4719 Other supraventricular tachycardia: Secondary | ICD-10-CM | POA: Insufficient documentation

## 2020-08-27 ENCOUNTER — Other Ambulatory Visit: Payer: Self-pay

## 2020-08-27 ENCOUNTER — Encounter: Payer: Self-pay | Admitting: Internal Medicine

## 2020-08-27 ENCOUNTER — Ambulatory Visit (INDEPENDENT_AMBULATORY_CARE_PROVIDER_SITE_OTHER): Payer: Medicare Other | Admitting: Internal Medicine

## 2020-08-27 VITALS — BP 162/78 | HR 68 | Ht 69.0 in | Wt 171.6 lb

## 2020-08-27 DIAGNOSIS — I4891 Unspecified atrial fibrillation: Secondary | ICD-10-CM | POA: Diagnosis not present

## 2020-08-27 DIAGNOSIS — I442 Atrioventricular block, complete: Secondary | ICD-10-CM

## 2020-08-27 DIAGNOSIS — I471 Supraventricular tachycardia: Secondary | ICD-10-CM

## 2020-08-27 DIAGNOSIS — Z950 Presence of cardiac pacemaker: Secondary | ICD-10-CM

## 2020-08-27 NOTE — Progress Notes (Signed)
ELECTROPHYSIOLOGY OFFICE  NOTE  Patient ID: Jimmy Palmer, MRN: [id number], DOB/AGE: 02/27/1941 79 y.o. Admit date: (Not on file) Date of Consult: 08/27/2020  Primary Physician: Lewis Moccasin, MD Primary Cardiologist: new Jimmy Palmer is a 79 y.o. 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  anticoagulation with Apixoban     The patient denies chest pain, shortness of breath, nocturnal dyspnea, orthopnea or peripheral edema.  There have been no palpitations , lightheadedness or syncope.    Not aware of any changes in functional status over the last 3 weeks coincidental with the onset of atrial arrhythmias  Date Cr K Hgb  9/19 1.08 4.2     1/21 1.33 4.5 13.6     DATE TEST EF   8/14 Echo   60 %   4/16 MYOVIEW   56 %  normal perfusion        Thromboembolic risk factors ( age  -2, HTN-1) for a CHADSVASc Score of >=3  Drinks regularly.  A cocktail and wine in the evenings.      Past Medical History:  Diagnosis Date  . Abnormal electrocardiogram   . Abnormal heart sounds   . Herniated lumbar intervertebral disc   . Hypertension   . Left bundle branch block   . Pacemaker   . Pure hypercholesterolemia   . Sick sinus syndrome ([facility])   . Sleep apnea   . VT (ventricular tachycardia) [facility])       Surgical History:  Past Surgical History:  Procedure Laterality Date  . INSERT / REPLACE / REMOVE PACEMAKER    . REPLACEMENT TOTAL KNEE    . VASECTOMY       Home Meds: Prior to Admission medications   Medication Sig Start Date End Date Taking? Authorizing Provider  aspirin EC 81 MG tablet Take 81 mg by mouth daily.   Yes [provider]  diphenhydrAMINE (BENADRYL) 25 mg capsule Take 25 mg by mouth every 6 (six) hours as needed.   Yes [provider]  doxazosin (CARDURA) 4 MG tablet Take 4 mg by mouth daily.    Yes [provider]  gabapentin (NEURONTIN) 300 MG capsule Take 300 mg by mouth 3 (three) times daily.   Yes [provider]  losartan-hydrochlorothiazide (HYZAAR) 100-12.5 MG tablet Take 1 tablet by mouth daily.   Yes [provider]  Melatonin 5 MG TABS Take 5 mg by mouth.   Yes [provider]  Multiple Vitamin (MULTIVITAMIN PO) Take by mouth.   Yes [provider]  simvastatin (ZOCOR) 40 MG tablet Take 40 mg by mouth daily.   Yes [provider]     Allergies: No Known Allergies       All other systems reviewed and negative.   BP (!) 162/78   Pulse 68   Ht 5\' 9"  (1.753 m)   Wt 171 lb 9.6 oz (77.8 kg)   SpO2 99%   BMI 25.34 kg/m   Well developed and well nourished in no acute distress HENT normal Neck supple with JVP-flat Clear Device pocket well healed; without hematoma or erythema.  There is minimal  tethering  Regular rate and rhythm, no * murmur Abd-soft with active BS No Clubbing cyanosis   edema Skin-warm and dry A & Oriented  Grossly normal sensory and motor function  ECG  atrial flutter-typical with underlying CHB and V pacing     Assessment and Plan:   Complete heart block  Pacemaker Medtronic The patient's device was interrogated.  The information was reviewed. No changes were made in the programming.     Atrial tach  Pocket Tethering   Atrial fibrillation persistent   ETOH use    Recurrent Atrial fib/flutter; not entirely sure if he has taken all of his scheduled Eliquis.  Hence, we will plan cardioversion in about 3 weeks, not withstanding the lack of attributable symptoms.  In the event that it recurs as flutter could consider catheter ablation; however, he has also had fibrillation   No bleeding on the Eliquis.  Blood pressure at home he says is about 130      Sherryl Manges Was at

## 2020-08-27 NOTE — Patient Instructions (Signed)
Medication Instructions:  Your physician recommends that you continue on your current medications as directed. Please refer to the Current Medication list given to you today. *If you need a refill on your cardiac medications before your next appointment, please call your pharmacy*   Lab Work: CBC and BMET to be scheduled  If you have labs (blood work) drawn today and your tests are completely normal, you will receive your results only by: Marland Kitchen MyChart Message (if you have MyChart) OR . A paper copy in the mail If you have any lab test that is abnormal or we need to change your treatment, we will call you to review the results.   Testing/Procedures: Your physician has recommended that you have a Cardioversion (DCCV). Electrical Cardioversion uses a jolt of electricity to your heart either through paddles or wired patches attached to your chest. This is a controlled, usually prescheduled, procedure. Defibrillation is done under light anesthesia in the hospital, and you usually go home the day of the procedure. This is done to get your heart back into a normal rhythm. You are not awake for the procedure. Please see the instruction sheet given to you today.    Follow-Up: At [facility], you and your health needs are our priority.  As part of our continuing mission to provide you with exceptional heart care, we have created designated Provider Care Teams.  These Care Teams include your primary Cardiologist (physician) and Advanced Practice Providers (APPs -  Physician Assistants and Nurse Practitioners) who all work together to provide you with the care you need, when you need it.  We recommend signing up for the patient portal called "MyChart".  Sign up information is provided on this After Visit Summary.  MyChart is used to connect with patients for Virtual Visits (Telemedicine).  Patients are able to view lab/test results, encounter notes, upcoming appointments, etc.  Non-urgent messages can be  sent to your provider as well.   To learn more about what you can do with MyChart, go to [email or web address].    Your next appointment:   Follow up to be scheduled

## 2020-08-30 ENCOUNTER — Telehealth: Payer: Self-pay | Admitting: Pharmacist

## 2020-08-30 NOTE — Telephone Encounter (Signed)
Spoke with patient. He has Nurse, learning disability and he was referring to his copay card. We should not have to do anything. I believe it was saying he has used 80% of the 3800 that is on the card annually. Card is good for 24 months since activation (activated 12/18/19). Amount should reset in Jan. Patient provided with out phone number if he has any issues. Patient was extremely grateful to [facility] who got him the copay card and to [facility] for investigating his answer and to myself for calling back.

## 2020-08-30 NOTE — Telephone Encounter (Signed)
-----   Message from Alois Cliche, RN sent at 08/30/2020 12:41 PM EDT ----- Regarding: FW: Eliquis 360 He said someone helped him from this office.  Possibly pharmacist then.  Thank you,  Mindi Junker ----- Message ----- From: Demetrios Loll, LPN Sent: 01/0/2725   9:07 AM EDT To: Alois Cliche, RN Subject: RE: Eliquis 360                                Hi Mindi Junker, This pt must be referring to a grant that he has applied for with someone else in the past and will need to reapply. I dont deal with grants. Thank you, Larita Fife ----- Message ----- From: Alois Cliche, RN Sent: 08/27/2020   4:32 PM EDT To: Lorelle Formosa Via, LPN Subject: Eliquis 360                                    Hi Larita Fife,  Mr Counterman brought [place] a letter stating he was at 80% of his maximum support per calendar year.  Will he reapply or do we assist him with this.  Thanks for your help,  Mindi Junker

## 2020-08-31 ENCOUNTER — Telehealth: Payer: Self-pay | Admitting: Internal Medicine

## 2020-08-31 NOTE — Telephone Encounter (Signed)
Spoke with pt's wife Stanton Kidney, [place] who is currently out of town and has questions related to patients upcoming cardioversion.  Pt's wife reports she is a retired cardiology Engineer, civil (consulting).  She is asking why Dr Graciela Husbands is choosing to have pt cardioverted rather than trying medication for conversion.  Pt's wife states it has been her experience that cardioversion's are not a lasting solution.   Pt's wife requests cardioversion be scheduled for sometime between November 1-4th as she is currently out of town and will be leaving again on November 5th.   Pt's wife advised will forward information to Dr Graciela Husbands for review of her concerns.

## 2020-08-31 NOTE — Telephone Encounter (Signed)
    Pt's wife would like to speak with RN Mindi Junker

## 2020-09-01 NOTE — Telephone Encounter (Signed)
Left VM will try again tomorrow

## 2020-09-07 DIAGNOSIS — Z01812 Encounter for preprocedural laboratory examination: Secondary | ICD-10-CM

## 2020-09-07 DIAGNOSIS — I4891 Unspecified atrial fibrillation: Secondary | ICD-10-CM

## 2020-09-14 ENCOUNTER — Telehealth: Payer: Self-pay | Admitting: Internal Medicine

## 2020-09-14 NOTE — Telephone Encounter (Signed)
Spoke with pt's wife,DPR and advised pt's DCCV moved to 09/28/2020 at 230pm.  Pt will need to arrive at 130pm.  Pt's wife verbalizes understanding and agrees with current plan.

## 2020-09-14 NOTE — Telephone Encounter (Signed)
New Message:    Wife called and she wanted to first apologize for not being able to answer Mindi Junker and Dr Odessa Fleming calls. She is away in [place] preparing for her daughter wedding on Saturday.Peyton Najjar, she would like to know if pt's Cardioversion on 09-28-20, could be in the afternoon instead of the morning please. Third, pt would like for Dr Graciela Husbands to refer him to a male Internal or Primary Care doctor please.

## 2020-09-21 NOTE — Telephone Encounter (Signed)
Patient is following up regarding pre-DCCV instructions. He would like to know if he can go to his local drug store to have his COVID screening. Please advise.

## 2020-09-22 NOTE — Telephone Encounter (Signed)
Spoke with pt and advised he will have to have Covid testing at [facility] site [address] [place], [place].  Pt verbalizes understanding and agrees with current plan.

## 2020-09-24 ENCOUNTER — Other Ambulatory Visit: Payer: Medicare Other | Admitting: *Deleted

## 2020-09-24 ENCOUNTER — Other Ambulatory Visit (HOSPITAL_COMMUNITY)
Admission: RE | Admit: 2020-09-24 | Discharge: 2020-09-24 | Disposition: A | Discharge status: Home / Self Care | Payer: Medicare Other | Source: Ambulatory Visit | Attending: Cardiovascular Disease | Admitting: Cardiovascular Disease

## 2020-09-24 ENCOUNTER — Other Ambulatory Visit: Payer: Self-pay

## 2020-09-24 DIAGNOSIS — Z01812 Encounter for preprocedural laboratory examination: Secondary | ICD-10-CM

## 2020-09-24 DIAGNOSIS — I4891 Unspecified atrial fibrillation: Secondary | ICD-10-CM

## 2020-09-24 DIAGNOSIS — Z20822 Contact with and (suspected) exposure to covid-19: Secondary | ICD-10-CM | POA: Diagnosis not present

## 2020-09-25 LAB — CBC
Hematocrit: 41.6 % (ref 37.5–51.0)
Hemoglobin: 13.7 g/dL (ref 13.0–17.7)
MCH: 30.8 pg (ref 26.6–33.0)
MCHC: 32.9 g/dL (ref 31.5–35.7)
MCV: 94 fL (ref 79–97)
Platelets: 249 10*3/uL (ref 150–450)
RBC: 4.45 x10E6/uL (ref 4.14–5.80)
RDW: 11.5 % — ABNORMAL LOW (ref 11.6–15.4)
WBC: 9.2 10*3/uL (ref 3.4–10.8)

## 2020-09-25 LAB — BASIC METABOLIC PANEL
BUN/Creatinine Ratio: 14 (ref 10–24)
BUN: 20 mg/dL (ref 8–27)
CO2: 22 mmol/L (ref 20–29)
Calcium: 9.3 mg/dL (ref 8.6–10.2)
Chloride: 100 mmol/L (ref 96–106)
Creatinine, Ser: 1.4 mg/dL — ABNORMAL HIGH (ref 0.76–1.27)
GFR calc Af Amer: 56 mL/min/{1.73_m2} — ABNORMAL LOW (ref >59)
GFR calc non Af Amer: 48 mL/min/{1.73_m2} — ABNORMAL LOW (ref >59)
Glucose: 94 mg/dL (ref 65–99)
Potassium: 4.3 mmol/L (ref 3.5–5.2)
Sodium: 137 mmol/L (ref 134–144)

## 2020-09-25 LAB — SARS CORONAVIRUS 2 (TAT 6-24 HRS): SARS Coronavirus 2: NEGATIVE

## 2020-09-27 ENCOUNTER — Telehealth: Payer: Self-pay | Admitting: Pharmacist

## 2020-09-27 MED ORDER — APIXABAN 5 MG PO TABS
5.0000 mg | ORAL_TABLET | Freq: Two times a day (BID) | ORAL | 1 refills | Status: DC
Start: 2020-09-27 — End: 2021-01-19

## 2020-09-27 MED ORDER — APIXABAN 5 MG PO TABS
5.0000 mg | ORAL_TABLET | Freq: Two times a day (BID) | ORAL | 5 refills | Status: DC
Start: 2020-09-27 — End: 2020-09-27

## 2020-09-27 NOTE — Telephone Encounter (Signed)
Pt called clinic stating his Eliquis copay went from $10 to > $200. Called his pharmacy, they stated that his insurance accepts the claim but doesn't contribute anything towards lowering his copay. Called Eliquis copay card - $3800 cap per calendar year. Pt has used 10 fills and maxed out the copay card - charged $133 initially then $468. They advised that pt can call pt assistance program #[phone].  Called pt's insurance who states that at a retail pharmacy, his 3 month copay would be $199 but a 1 month copay would be $6. [employer] that what they are quoting as pt's copay is not what the pharmacy is showing. They stated they would have someone look into it and would give [place] a call back.

## 2020-09-27 NOTE — Telephone Encounter (Addendum)
Received call back from pt's insurance and was advised that [facility] is penalizing him for using 30 fill at [facility]. His copay would be $300 for a 90 day supply at [facility], [facility] [place] or [facility].  [facility]. Provided them with pt's insurance information. Earliest fill date is 11/16 since he picked up rx on 10/25, they cannot anticipate copay at this time.   Advised pt that since he already picked up refill at [facility], he'll have enough medication to last until early December. We will provide him with 1 month of samples until he can start using copay card again in January. Will [facility] in [facility] and get copay card transferred.   Pt very appreciative of all of my help. Overall time spent assisting pt was ~90 minutes.

## 2020-09-28 ENCOUNTER — Ambulatory Visit (HOSPITAL_COMMUNITY): Payer: Medicare Other | Admitting: Anesthesiology

## 2020-09-28 ENCOUNTER — Ambulatory Visit (HOSPITAL_COMMUNITY)
Admission: RE | Admit: 2020-09-28 | Discharge: 2020-09-28 | Disposition: A | Discharge status: Home / Self Care | Payer: Medicare Other | Attending: Cardiovascular Disease | Admitting: Cardiovascular Disease

## 2020-09-28 ENCOUNTER — Ambulatory Visit (HOSPITAL_COMMUNITY): Admission: RE | Admit: 2020-09-28 | Payer: Medicare Other | Source: Home / Self Care | Admitting: Cardiovascular Disease

## 2020-09-28 ENCOUNTER — Encounter (HOSPITAL_COMMUNITY): Admission: RE | Payer: Self-pay | Source: Home / Self Care

## 2020-09-28 ENCOUNTER — Encounter (HOSPITAL_COMMUNITY): Payer: Self-pay | Admitting: Cardiovascular Disease

## 2020-09-28 ENCOUNTER — Other Ambulatory Visit: Payer: Self-pay

## 2020-09-28 ENCOUNTER — Encounter (HOSPITAL_COMMUNITY)
Admission: RE | Disposition: A | Discharge status: Home / Self Care | Payer: Self-pay | Source: Home / Self Care | Attending: Cardiovascular Disease

## 2020-09-28 DIAGNOSIS — I4892 Unspecified atrial flutter: Secondary | ICD-10-CM | POA: Diagnosis not present

## 2020-09-28 DIAGNOSIS — I4891 Unspecified atrial fibrillation: Secondary | ICD-10-CM | POA: Insufficient documentation

## 2020-09-28 DIAGNOSIS — I4819 Other persistent atrial fibrillation: Secondary | ICD-10-CM

## 2020-09-28 HISTORY — PX: CARDIOVERSION: SHX1299

## 2020-09-28 SURGERY — CARDIOVERSION
Anesthesia: General

## 2020-09-28 MED ORDER — LIDOCAINE 2% (20 MG/ML) 5 ML SYRINGE
INTRAMUSCULAR | Status: DC | PRN
  Administered 2020-09-28: 80 mg via INTRAVENOUS

## 2020-09-28 MED ORDER — PROPOFOL 10 MG/ML IV BOLUS
INTRAVENOUS | Status: DC | PRN
  Administered 2020-09-28: 60 mg via INTRAVENOUS

## 2020-09-28 MED ORDER — SODIUM CHLORIDE 0.9 % IV SOLN: INTRAVENOUS | Status: DC | PRN

## 2020-09-28 NOTE — Anesthesia Postprocedure Evaluation (Signed)
Anesthesia Post Note  Patient: Jimmy Palmer  Procedure(s) Performed: CARDIOVERSION (N/A )     Patient location during evaluation: PACU Anesthesia Type: General Level of consciousness: sedated and awake Pain management: pain level controlled Vital Signs Assessment: post-procedure vital signs reviewed and stable Respiratory status: spontaneous breathing and respiratory function stable Cardiovascular status: stable Postop Assessment: no apparent nausea or vomiting Anesthetic complications: no   No complications documented.  Last Vitals:  Vitals:   09/28/20 1451 09/28/20 1501  BP: (!) 142/100 (!) 194/75  Pulse: 90 100  Resp: 12 16  Temp: (!) 36.3 C   SpO2:  99%    Last Pain:  Vitals:   09/28/20 1501  TempSrc:   PainSc: 0-No pain                 Mellody Dance

## 2020-09-28 NOTE — Transfer of Care (Signed)
Immediate Anesthesia Transfer of Care Note  Patient: Jimmy Palmer  Procedure(s) Performed: CARDIOVERSION (N/A )  Patient Location: PACU  Anesthesia Type:General  Level of Consciousness: awake, alert  and oriented  Airway & Oxygen Therapy: Patient Spontanous Breathing  Post-op Assessment: Report given to RN and Post -op Vital signs reviewed and stable  Post vital signs: Reviewed and stable  Last Vitals:  Vitals Value Taken Time  BP    Temp    Pulse    Resp    SpO2      Last Pain:  Vitals:   09/28/20 1359  TempSrc: Oral  PainSc: 0-No pain         Complications: No complications documented.

## 2020-09-28 NOTE — Discharge Instructions (Signed)
Electrical Cardioversion Procedure Note Jimmy Palmer [id number] [birth year]  Procedure: Electrical Cardioversion Indications:  {indications:3041459}  Procedure Details Consent: {consent:3041460} Time Out: Verified patient identification, verified procedure, site/side was marked, verified correct patient position, special equipment/implants available, medications/allergies/relevent history reviewed, required imaging and test results available.  {time out performed:3041467}  Patient placed on cardiac monitor, pulse oximetry, supplemental oxygen as necessary.  Sedation given: {sedation:3041479} Pacer pads placed {pad placement:3041462}.  Cardioverted {number:3041463} time(s).  Cardioverted at {cardioversion:3041464}.  Evaluation Findings: Post procedure EKG shows: {post procedure:3041466} Complications: {complications:3041465} Patient {did/not:14019} tolerate procedure well.   Jimmy Palmer Jimmy Palmer 09/28/2020, 3:08 PM

## 2020-09-28 NOTE — H&P (Signed)
     79 yo with atrial fib and atrial flutter Pt of Dr. Graciela Husbands  Was last seen oct. 1, 2021 Was set up to have cardioversion yesterday but was rescheduled for today   Has not missed any doses of Eliquis   Will proceed with cardioversion      Kristeen Miss, MD  09/28/2020 2:35 PM    [facility] [address]  [address] [place], [place]  [address] Phone: [phone]; Fax: [phone]

## 2020-09-28 NOTE — CV Procedure (Signed)
    Cardioversion Note  Jimmy Palmer [id number] Aug 07, 1941  Procedure: DC Cardioversion Indications: atrial fib   Procedure Details Consent: Obtained Time Out: Verified patient identification, verified procedure, site/side was marked, verified correct patient position, special equipment/implants available, Radiology Safety Procedures followed,  medications/allergies/relevent history reviewed, required imaging and test results available.  Performed  The patient has been on adequate anticoagulation.  The patient received IV Lidocaine 80 mg IV followed by Proprol 60 mg IV  for sedation.  Synchronous cardioversion was performed at  200  joules.  The cardioversion was successful.     Complications: No apparent complications Patient did tolerate procedure well.   Vesta Mixer, Montez Hageman., MD, [facility] 09/28/2020, 2:47 PM

## 2020-09-28 NOTE — Anesthesia Preprocedure Evaluation (Addendum)
Anesthesia Evaluation  Patient identified by MRN, date of birth, ID band Patient awake    Reviewed: Allergy & Precautions, NPO status , Patient's Chart, lab work & pertinent test results  Airway Mallampati: II  TM Distance: >3 FB Neck ROM: Full    Dental no notable dental hx.    Pulmonary sleep apnea , former smoker,    Pulmonary exam normal breath sounds clear to auscultation       Cardiovascular hypertension, Normal cardiovascular exam+ dysrhythmias Atrial Fibrillation + pacemaker (sick sinus syndrome; high grade heart block per EP)  Rhythm:Irregular Rate:Normal     Neuro/Psych negative neurological ROS  negative psych ROS   GI/Hepatic negative GI ROS,   Endo/Other  negative endocrine ROS  Renal/GU Renal InsufficiencyRenal disease  negative genitourinary   Musculoskeletal negative musculoskeletal ROS (+)   Abdominal Normal abdominal exam  (+)   Peds negative pediatric ROS (+)  Hematology negative hematology ROS (+)   Anesthesia Other Findings   Reproductive/Obstetrics negative OB ROS                            Anesthesia Physical Anesthesia Plan  ASA: III  Anesthesia Plan: General   Post-op Pain Management:    Induction:   PONV Risk Score and Plan: 2 and Treatment may vary due to age or medical condition  Airway Management Planned: Mask  Additional Equipment:   Intra-op Plan:   Post-operative Plan:   Informed Consent:   Plan Discussed with:   Anesthesia Plan Comments:         Anesthesia Quick Evaluation

## 2020-09-30 ENCOUNTER — Encounter (HOSPITAL_COMMUNITY): Payer: Self-pay | Admitting: Cardiovascular Disease

## 2020-10-01 NOTE — Interval H&P Note (Signed)
History and Physical Interval Note:  10/01/2020 5:36 PM  Jimmy Palmer  has presented today for surgery, with the diagnosis of afib.  The various methods of treatment have been discussed with the patient and family. After consideration of risks, benefits and other options for treatment, the patient has consented to  Procedure(s): CARDIOVERSION (N/A) as a surgical intervention.  The patient's history has been reviewed, patient examined, no change in status, stable for surgery.  I have reviewed the patient's chart and labs.  Questions were answered to the patient's satisfaction.     Kristeen Miss

## 2020-10-12 ENCOUNTER — Telehealth: Payer: Self-pay

## 2020-10-12 NOTE — Telephone Encounter (Signed)
-----   Message from Duke Salvia, MD sent at 10/11/2020  4:46 PM EST ----- Please Inform Patient  Labs are normal x mild impairment of renal function-- shoiuld be followed by his PCP   Thanks

## 2020-10-12 NOTE — Telephone Encounter (Signed)
Spoke with Jimmy Palmer and advised per Dr Graciela Husbands labs are normal with the exception of mild kidney elevation.  Jimmy Palmer advised will need to have his PCP follow renal function.  Jimmy Palmer verbalizes understanding and thanked Charity fundraiser for call.

## 2020-10-25 ENCOUNTER — Telehealth: Payer: Self-pay | Admitting: Pharmacist

## 2020-10-25 MED ORDER — APIXABAN 5 MG PO TABS: 5.0000 mg | ORAL_TABLET | Freq: Two times a day (BID) | ORAL | 0 refills | Status: DC

## 2020-10-25 NOTE — Telephone Encounter (Signed)
Pt calling clinic for 1 month of Eliquis samples - see phone note 09/27/20 for details. Samples placed up front for pt.

## 2020-10-26 NOTE — Telephone Encounter (Signed)
Patient made aware that we will [facility] [address] the last week of Dec. We will also get [facility]. Will get info from [facility] and [facility] with it.

## 2020-10-29 ENCOUNTER — Other Ambulatory Visit: Payer: Self-pay

## 2020-10-29 ENCOUNTER — Ambulatory Visit (INDEPENDENT_AMBULATORY_CARE_PROVIDER_SITE_OTHER): Payer: Medicare Other | Admitting: Internal Medicine

## 2020-10-29 ENCOUNTER — Encounter: Payer: Self-pay | Admitting: Internal Medicine

## 2020-10-29 VITALS — BP 136/80 | HR 88 | Ht 69.0 in | Wt 174.8 lb

## 2020-10-29 DIAGNOSIS — I4819 Other persistent atrial fibrillation: Secondary | ICD-10-CM | POA: Diagnosis not present

## 2020-10-29 DIAGNOSIS — Z950 Presence of cardiac pacemaker: Secondary | ICD-10-CM | POA: Diagnosis not present

## 2020-10-29 NOTE — Patient Instructions (Signed)

## 2020-10-29 NOTE — Progress Notes (Signed)
ELECTROPHYSIOLOGY OFFICE  NOTE  Patient ID: Jimmy Palmer, MRN: [id number], DOB/AGE: Jul 09, 1941 79 y.o. Admit date: (Not on file) Date of Consult: 10/29/2020  Primary Physician: Lewis Moccasin, MD Primary Cardiologist: new [name] is a 79 y.o. 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  anticoagulation with Apixoban    When last seen, persistent atrial fibrillation/flutter noted.  Cardioversion undertaken.  Symptoms much improved with less fatigue.  While not able to have identified symptoms associated with the fibrillation/flutter on coming, notes a significant difference following cardioversion.  Drinks regularly.  A cocktail and wine in the evenings.Has cut down on his alcohol.   Date Cr K Hgb  9/19 1.08 4.2     1/21 1.33 4.5 13.6     DATE TEST EF   8/14 Echo   60 %   4/16 MYOVIEW   56 %  normal perfusion        Thromboembolic risk factors ( age  -2, HTN-1) for a CHADSVASc Score of >=3        Past Medical History:  Diagnosis Date   Abnormal electrocardiogram    Abnormal heart sounds    Herniated lumbar intervertebral disc    Hypertension    Left bundle branch block    Pacemaker    Pure hypercholesterolemia    Sick sinus syndrome (HCC)    Sleep apnea    VT (ventricular tachycardia) (HCC)       Surgical History:  Past Surgical History:  Procedure Laterality Date   CARDIOVERSION N/A 09/28/2020   Procedure: CARDIOVERSION;  Surgeon: Nahser, Deloris Ping, MD;  Location: [facility];  Service: Cardiovascular;  Laterality: N/A;   INSERT / REPLACE / REMOVE PACEMAKER     REPLACEMENT TOTAL KNEE     VASECTOMY       Home Meds: Prior to Admission medications   Medication Sig Start Date End Date Taking? Authorizing Provider  aspirin EC 81 MG tablet Take 81 mg by mouth daily.   Yes [provider]  diphenhydrAMINE (BENADRYL) 25 mg capsule Take 25 mg by mouth every 6 (six) hours as needed.   Yes [provider]  doxazosin (CARDURA) 4 MG tablet Take 4 mg by mouth daily.   Yes [provider]  gabapentin (NEURONTIN) 300 MG capsule Take 300 mg by mouth 3 (three) times daily.   Yes [provider]  losartan-hydrochlorothiazide (HYZAAR) 100-12.5 MG tablet Take 1 tablet by mouth daily.   Yes [provider]  Melatonin 5 MG TABS Take 5 mg by mouth.   Yes [provider]  Multiple Vitamin (MULTIVITAMIN PO) Take by mouth.   Yes [provider]  simvastatin (ZOCOR) 40 MG tablet Take 40 mg by mouth daily.   Yes [provider]     Allergies: No Known Allergies       All other systems reviewed and negative.   BP 136/80    Pulse 88    Ht 5\' 9"  (1.753 m)    Wt 174 lb 12.8 oz (79.3 kg)    SpO2 93%    BMI 25.81 kg/m   Well developed and well nourished in no acute distress HENT normal Neck supple with JVP-flat Clear Device pocket well healed; without hematoma or erythema.  There is no tethering  Regular rate and rhythm, no  *  murmur Abd-soft with active BS No Clubbing cyanosis   edema Skin-warm and dry A & Oriented  Grossly normal sensory and motor function  ECG sinus rhythm with P synchronous pacing at 88    Assessment and Plan:   Complete heart block  Pacemaker Medtronic The patient's device was interrogated.  The information was reviewed. No changes were made in the programming.     Atrial tach  Pocket Tethering   Atrial fibrillation persistent   ETOH use acknowledges less    Much improved following cardioversion.  We discussed different strategies to try to identify the presence of recurrent atrial fibrillation.  His resting heart rates are in the 70s-80s while in sinus.  In the event that he develops A. fib, he will mode switch to a lower rate of 60.  Hence, I have suggested that he try and take his  pulse on a daily basis having been seated for a few minutes to allow his resting rates too slow.  I have also suggested he use her AliveCor monitor to clarify changes in rates so as to be able to promptly identify atrial fibrillation.  The frequency of recurrent atrial fibrillation would inform treatment recommendations.  In the event that they remain sporadic, intermittent cardioversion I think is quite sufficient  Encouraged him in his having decreased alcohol intake.      Sherryl Manges Was at

## 2020-11-22 ENCOUNTER — Telehealth: Payer: Self-pay | Admitting: Pharmacist

## 2020-11-22 MED ORDER — APIXABAN 5 MG PO TABS: 5.0000 mg | ORAL_TABLET | Freq: Two times a day (BID) | ORAL | 0 refills | Status: DC

## 2020-11-22 NOTE — Telephone Encounter (Signed)
Patient called and stated that he [facility] [address] I advised him that his copay card should reset come Jan 1. He is a few tablets short of the new year. I advised that I will leave him one more box of Eliquis samples for pick up. [facility] should [facility] and make sure they have his card info on file.   Patient called back and said [facility] not [facility] info on file. Called [facility] for info [facility] [id number] ID [id number] PC[employer]ty BIN [id number]  [facility]ed [facility] and gave them this information. Advised that card will not work this year, but will work next year. Patient advised of the above

## 2020-12-06 ENCOUNTER — Telehealth: Payer: Self-pay

## 2020-12-06 NOTE — Telephone Encounter (Signed)
**Note De-Identified Zyana Amaro Obfuscation** I started a Eliquis PA through covermymeds: Key: [id number]  Following message received: Mcarthur Rossetti Key: [id number] - PA Case ID: [id number] Outcome: This request has been approved using information available on the patient's profile.  CaseId: [id number]; Status: Approved;  Review Type:Prior Auth; Coverage Start Date:11/06/2020;Coverage End Date:12/06/2021 Drug: Eliquis 5MG  tablets Form: [facility] Electronic PA Form [phone] NCPDP)

## 2021-01-19 ENCOUNTER — Other Ambulatory Visit: Payer: Self-pay | Admitting: Internal Medicine

## 2021-01-19 NOTE — Telephone Encounter (Signed)
Eliquis 5mg  refill request received. Patient is 80 years old, weight-79.3kg, Crea-1.40 on 09/24/2020, Diagnosis-Afib, and last seen by Dr. 09/26/2020 on 10/29/2020. Dose is appropriate based on dosing criteria. Will send in refill to requested pharmacy.

## 2021-02-28 NOTE — Progress Notes (Signed)
Mcarthur Rossetti    Date of Visit: 08/12/2018   Date of Birth: 02/28/1941  Age: 80 yrs.  Medical  Record Number: [id number]  __  CURRENT DIAGNOSES     1. 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 Pure hypercholesterolemia, unspecified, E78.00  3. Essential (primary) hypertension, I10  4. Sick sinus syndrome, I49.5  5. Device check cardiac pacemaker, Z45.010  6. Presence of cardiac pacemaker, Z95.0  7. Abnormal electrocardiogram  [ECG] [EKG], R94.31  8. Left bundle-branch block, unspecified, I44.7  9. Abnormal heart sounds, 785.3  10. Sleep Apnea, 786.09  __  ALLERGIES     NKDA  __  MEDICATIONS     1. Aspirin 81 mg Tablet, Chewable, 1 p.o. q.d.  2. Benadryl 25  mg capsule, 1 po qhs  3. doxazosin 4 mg tablet, 1 po qd  4. gabapentin 100 mg capsule, 1 po qhs  5. losartan 100 mg-hydrochlorothiazide 12.5 mg tablet, 1 po qd  6. melatonin 5 mg tablet, 1 po qhs  7. multivitamin capsule, 1 po qd   8. simvastatin 40 mg tablet, 1 po qd  __  CHIEF COMPLAINT/REASON FOR VISIT  Followup of Essential (primary) hypertension, Followup of Left bundle-branch  block, unspecified, Followup of Pure hypercholesterolemia and unspecified  __  HISTORY OF PRESENT ILLNESS  Mr. Tomko returns today stable from a  cardiac standpoint. Unfortunately her herniated a disc in his leg and is going through physical therapy. He is gradually responding to the physical therapy. He denies chest pain, shortness of breath, palpitations, dizziness or syncope. His lab work is  excellent.  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 total knee 1/15; Trauma History : Herniated L5 disc; Cardiology Procedures-Invasive: No previous  interventional or invasive cardiology procedures.;  Cardiology Procedures-Noninvasive: Chemical Dual Isotope December 2004, 12/08, 4/11, 4/16, Echo 1/05, 8/14, Holter 2/05; Left Ventricular Ejection Fraction : LVEF of 56% documented via nuclear study on 03/18/2015  __  CARDIAC RISK FACTORS      Tobacco Abuse: used to smoke, but quit; Family History of Heart Disease: negative;  Hyperlipidemia: positive; Hypertension: positive;   Diabetes Mellitus: negative; Prior History of Heart Disease: negative;  Obesity: negative; Sedentary Life Style:negative;  EAV:[id number]; Menopausal:not applicable  __   SOCIAL HISTORY    Alcohol Use: drinks daily;  Smoking: Quit 1/05; Former smoker [phone]); Diet: Regular diet without modifications and Caffeine  use-3-4 per day; Exercise: Exercises regularly, walking and weights; Occupation : Retired from [employer];   __  PHYSICAL EXAMINATION    Vital Signs:  Blood Pressure:   150/82 Sitting, Right arm, regular cuff  146/84 Sitting, Left arm, regular cuff    Weight: 170.20 lbs.   Height: 68.00"  BMI: 25.88   Pulse:  76/min. Apical       Constitutional: Well developed, well nourished, in no acute distress  Skin: warm and dry to touch Eyes: conjunctivae and lids unremarkable  ENT: No pallor or cyanosis Neck: no JVD Chest : clear to auscultation bilaterally, Normal symmetry Cardiac: Regular rhythm, S1 normal, S2 normal, Apical impulse not displaced, no murmurs, gallops  or rubs detected Abdomen: abdomen unremarkable, abdomen soft Peripheral Pulses : The femoral, popliteal, dorsalis pedis, and posterior tibial pulses are full and equal bilaterally  with no bruits auscultated. Extremities/Back: no  edema present Neurological: No gross motor or sensory deficits noted, affect appropriate, oriented to time, person and place.   __     Medications added today by the physician:    IMPRESSION :  1. 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   Pacemaker was checked  in the office today and is functioning normally.  2. Hypertension well controlled.  3. Hyperlipidemia.  4. Sleep  apnea.  5. Herniated disc.      RECOMMENDATION:   1. Continue current program.  2. Return in 1 year for lab work and [facility] nuclear  stress test followed by office visit.    Alfonso Ramus. Julius Bowels, M.D. F.A.C.C.    DMP:lo   ____________________________   Christianne Dolin  Diet_mgmt_edu,_guidance_and_counseling TODAY  ZO:XWRUEAV Education ICD-10: I10 MedlinePlus Connect results for ICD-10 I10  Lipid Panel, Chem 24 1 year  Lexiscan Infusion 1 year  Pacemaker Clinic Appointment 3 months   Return Visit 15 MIN 1 year

## 2021-02-28 NOTE — Progress Notes (Signed)
[facility] OFFICE  [address] [place], [place] [address]     Mcarthur Rossetti    Date of Visit: 05/08/2016   Date of Birth: 10-Apr-1941  Age: 80 yrs.  Medical  Record Number: [id number]  __  CURRENT DIAGNOSES     1. Pure hypercholesterolemia, unspecified,  E78.00  2. Essential (primary) hypertension, I10  3. 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 Status post Pacer, V45.01  5. Device check cardiac pacemaker, Z45.010  6. Abnormal heart sounds, 785.3  7. Sleep Apnea, 786.09   8. Left bundle-branch block, unspecified, I44.7  9. Abnormal electrocardiogram [ECG] [EKG], R94.31  10. 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 Aspirin 81 mg Tablet, Chewable, 1 p.o. q.d.  2. multivitamin  capsule, 1 po qd  3. Benadryl 25 mg capsule, 1 po qhs  4. simvastatin 40 mg tablet, 1 po qd  5. losartan 100 mg-hydrochlorothiazide 12.5 mg tablet, 1 po qd  6. doxazosin 4 mg tablet, 1 po qd  __   CHIEF COMPLAINT/REASON FOR VISIT  Followup of - Bradycardia-Sick Sinus Syndrome, Followup of Essential (primary) hypertension, Followup of Pure hypercholesterolemia and unspecified  __   HISTORY OF PRESENT ILLNESS  Mr. Biskup returns today remaining stable. He is active, denying chest pain, shortness of breath, palpitations, dizziness or syncope. He is a little tardy in getting his pacemaker  checked. Dr. Jarome Lamas checks his cholesterol.  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 total knee 1/15; Trauma History : No previous history of significant trauma.; Cardiology Procedures-Invasive: No previous interventional or invasive cardiology procedures.;  Cardiology  Procedures-Noninvasive: Chemical Dual Isotope December 2004, 12/08, 4/11, 4/16, Echo 1/05, 8/14, Holter 2/05; Left Ventricular Ejection Fraction : LVEF of 56% documented via nuclear study on 03/18/2015  ___  FAMILY HISTORY   Father -- No cardiovascular disease    __  CARDIAC RISK FACTORS      Tobacco Abuse: used to smoke, but quit; Family History of Heart Disease: negative;  Hyperlipidemia: positive; Hypertension: positive;   Diabetes Mellitus: negative; Prior History of Heart Disease: negative;  Obesity: negative; Sedentary Life Style:negative; Age :positive; Menopausal:not applicable  __  SOCIAL HISTORY     Alcohol Use: drinks daily; Smoking: Quit 1/05; Former smoker [phone]  Diet: Regular diet without modifications and Caffeine use-3-4 per day; Exercise: Exercises regularly, walking  and weights; Occupation: Retired from [employer];   __  PHYSICAL EXAMINATION     Vital Signs:  Blood Pressure:  130/72 Sitting, Left arm, regular cuff  130/70 Sitting, Right arm,  regular cuff    Weight: 169.00 lbs.  Height: 68"   BMI: 25   Pulse: 76/min. Apical        Constitutional: Well developed, well nourished, in no acute distress Skin: warm and dry to touch  Eyes: conjunctivae and lids unremarkable ENT: No pallor  or cyanosis Neck: no JVD Chest : clear to auscultation bilaterally, Normal symmetry Cardiac: Regular rhythm, S1 normal, S2 normal, Apical impulse not displaced, no murmurs, gallops or  rubs detected Abdomen: abdomen unremarkable, abdomen soft Peripheral Pulses : The femoral, popliteal, dorsalis pedis, and posterior tibial pulses are full and equal bilaterally with no bruits  auscultated. Extremities/Back : no edema present Neurological: No gross motor or sensory deficits noted, affect appropriate, oriented  to time, person and place.   __    Medications added today by the physician:  doxazosin 4 mg tablet, 1 po qd, 90  losartan 100 mg-hydrochlorothiazide  12.5 mg tablet, 1 po qd, 90  simvastatin 40 mg tablet, 1 po qd,  90      IMPRESSION:  1. 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  2. Hypertension well controlled.  3. Hyperlipidemia.  4. Sleep apnea.    RECOMMENDATION:   1. Continue  current program.  2. Routine pacemaker follow up.  3. Office visit with me in one year.    Alfonso Ramus. Julius Bowels, M.D. F.A.C.C.    DMP: lo     cc: THOMAS J. MANCINI MD    rw  ____________________________  Christianne Dolin  Return Visit 15 MIN 1 year  Diet_[facility],_guidance_and_counseling  TODAY

## 2021-02-28 NOTE — Progress Notes (Signed)
[facility] OFFICE  [address] [place], [place] [address]     Mcarthur Rossetti    Date of Visit: 01/15/2015   Date of Birth: 15-May-1941  Age: 80 yrs.  Medical  Record Number: [id number]  __  CURRENT DIAGNOSES     1. Pure hypercholesterolemia, E78.0  2.  Essential (primary) hypertension, I10  3. Left bundle-branch block, unspecified, I44.7  4. Encounter for checking and testing of cardiac pacemaker pulse generator [battery], Z45.010  5. Status post Pacer, V45.01  6. Arrhythmia-Ventricular  Tachycardia, 427.1  7. 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 Abnormal heart sounds, 785.3  9. Sleep Apnea, 786.09  10. - Abnormal EKG, 794.31  __  ALLERGIES     NKDA  __  MEDICATIONS     1. Aspirin 81 mg Tablet, Chewable, 1 p.o. q.d.  2. multivitamin  capsule, 1 po qd  3. losartan 100 mg-hydrochlorothiazide 12.5 mg tablet, 1 po qd  4. simvastatin 40 mg tablet, 1 po qd  5. doxazosin 4 mg tablet, 1 po qd  6. Benadryl 25 mg capsule, 1 po qhs  __   CHIEF COMPLAINT/REASON FOR VISIT  Followup of Essential (primary) hypertension, Followup of Pure hypercholesterolemia and Followup of Status post Pacer  __   HISTORY OF PRESENT ILLNESS  [name] is doing well. He got through his right total knee replacement perfectly. He is now exercising denying chest pain, shortness of breath, palpitations, dizziness or  syncope. He is following his pacemaker regularly.    __  PAST HISTORY     Past Medical  Illnesses: Hyperlipidemia, Hypertension, Sleep Apnea;  Past Cardiac Illnesses: AV Dissociation while  on verapamil, Rate related LBBB; Infectious Diseases: Usual childhood illnesses of mumps, measles and chickenpox;  Surgical Procedures: Medtronic DDDR pacemaker placement 01/05/2004, generator change 10/13, vasectomy, R. total knee 1/15; Trauma History : No previous history of significant trauma.; Cardiology Procedures-Invasive: No previous interventional or invasive cardiology procedures.;  Cardiology Procedures-Noninvasive: Chemical  Dual Isotope December 2004, 12/08, 4/11, Echo 1/05, 8/14, Holter 2/05; Left Ventricular Ejection Fraction : LVEF of 60% documented via echocardiogram on 07/07/2013  ___  FAMILY HISTORY  Father --  No cardiovascular disease    __  CARDIAC RISK FACTORS     Tobacco Abuse: used to smoke, but quit;  Family History of Heart Disease: negative; Hyperlipidemia: positive;  Hypertension: positive;  Diabetes Mellitus: negative;  Prior History of Heart Disease: negative; Obesity: negative;  Sedentary Life Style:negative; JYN:WGNFAOZH; Menopausal :not applicable  __  SOCIAL HISTORY     Alcohol Use: drinks daily; Smoking: Quit 1/05; Former smoker [phone]);  Diet: Regular diet without modifications and Caffeine use-3-4 per day; Exercise: Exercises regularly,  walking and weights; Occupation: Retired from [employer];   __  PHYSICAL EXAMINATION     Vital Signs:  Blood Pressure:  132/82 Sitting, Left arm, regular cuff  134/82 Sitting, Right arm, regular cuff     Weight: 170.00 lbs.  Height: 68"  BMI:  26   Pulse: 72/min. Apical   Respirations:  18/min. regular and relaxed      Constitutional: Well developed, well nourished, in no acute distress  Skin: warm and dry to touch Eyes: conjunctivae and lids unremarkable  ENT: No pallor or cyanosis Neck: no JVD Chest : clear to auscultation bilaterally, Normal symmetry Cardiac: Regular rhythm, S1 normal, S2 normal, Apical impulse not displaced, no murmurs, gallops  or rubs detected Abdomen: abdomen unremarkable, abdomen soft Peripheral Pulses : The femoral, popliteal, dorsalis pedis, and posterior tibial  pulses are full and equal bilaterally with no bruits auscultated. Extremities/Back: no  edema present Neurological: No gross motor or sensory deficits noted, affect appropriate, oriented to time, person and place.   __     Medications added today by the physician:  doxazosin 4 mg tablet, 1 po qd, 90  losartan 100 mg-hydrochlorothiazide 12.5 mg tablet, 1 po qd, 90  simvastatin 40 mg tablet, 1 po qd,  90       IMPRESSION:  1. History of high grade AV block. Status post Medtronic DDDR pacemaker February   2005 with generator change 2013.  2. Hypertension well controlled.  3. Hyperlipidemia reasonably controlled.  4. Normal left ventricular  function by echo August 2014.  5. Obstructive sleep apnea.    RECOMMENDATION:   1. Lexiscan nuclear stress test. Given his left bundle branch block he is not a   candidate for exercise stress testing. He has not had a stress test in  almost five years.   2. Continue medications.  3. Return in one year or sooner PRN. Certainly he will return sooner if his stress test is   abnormal.     Justin Cisneros. Julius Bowels, M.D. F.A.C.C.    DMP:lo    cc: THOMAS J. MANCINI MD    rw       ____________________________  TODAYS [facility] within 6 weeks  Return Visit 15 MIN 1 year        Gilmer Mor, MD,  [facility]

## 2021-02-28 NOTE — Progress Notes (Signed)
[facility] OFFICE  [address] [place], [place] [address]     Justin Cisneros    Date of Visit: 08/10/2017   Date of Birth: 1941/07/19  Age: 80 yrs.  Medical  Record Number: [id number]  __  CURRENT DIAGNOSES     1. 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 Pure hypercholesterolemia, unspecified, E78.00  3. Essential (primary) hypertension, I10  4. Sick sinus syndrome, I49.5  5. Device check cardiac pacemaker, Z45.010  6. Presence of cardiac pacemaker, Z95.0  7. Abnormal electrocardiogram  [ECG] [EKG], R94.31  8. Left bundle-branch block, unspecified, I44.7  9. Abnormal heart sounds, 785.3  10. Sleep Apnea, 786.09  __  ALLERGIES     NKDA  __  MEDICATIONS     1. Aspirin 81 mg Tablet, Chewable, 1 p.o. q.d.  2. Benadryl 25  mg capsule, 1 po qhs  3. doxazosin 4 mg tablet, 1 po qd  4. losartan 100 mg-hydrochlorothiazide 12.5 mg tablet, 1 po qd  5. multivitamin capsule, 1 po qd  6. simvastatin 40 mg tablet, 1 po qd  __   CHIEF COMPLAINT/REASON FOR VISIT  Followup of Essential (primary) hypertension, Followup of Pure hypercholesterolemia, unspecified and Followup of Sick sinus syndrome  __   HISTORY OF PRESENT ILLNESS  Justin Cisneros returns today doing very well. He is active physically denying chest pain, shortness of breath, palpitations, dizziness or syncope. He follows his pacemaker religiously.  Recent lab work is excellent.   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 total knee 1/15; Trauma History : No previous history of significant trauma.; Cardiology Procedures-Invasive: No previous interventional or invasive cardiology procedures.;  Cardiology Procedures-Noninvasive: Chemical Dual Isotope  December 2004, 12/08, 4/11, 4/16, Echo 1/05, 8/14, Holter 2/05; Left Ventricular Ejection Fraction : LVEF of 56% documented via nuclear study on 03/18/2015  ___  FAMILY HISTORY  Father --  No cardiovascular disease    __  CARDIAC RISK FACTORS     Tobacco Abuse: used to smoke, but quit;  Family History of Heart Disease: negative; Hyperlipidemia: positive;  Hypertension: positive;  Diabetes Mellitus: negative;  Prior History of Heart Disease: negative; Obesity: negative;  Sedentary Life Style:negative; JYN:WGNFAOZH; Menopausal :not applicable  __  SOCIAL HISTORY     Alcohol Use: drinks daily; Smoking: Quit 1/05; Former smoker [phone]);  Diet: Regular diet without modifications and Caffeine use-3-4 per day; Exercise: Exercises regularly,  walking and weights; Occupation: Retired from [employer];   __  PHYSICAL EXAMINATION     Vital Signs:  Blood Pressure:  142/80 Sitting, Left arm, regular cuff  140/78 Sitting, Right arm, regular cuff     Weight: 168.00 lbs.  Height: 68.00"  BMI:  25   Pulse: 64/min. Apical       Constitutional:  Well developed, well nourished, in no acute distress Skin: warm and dry to touch Eyes : conjunctivae and lids unremarkable ENT: No pallor or cyanosis Neck : no JVD Chest: clear to auscultation bilaterally, Normal symmetry Cardiac : Regular rhythm, S1 normal, S2 normal, Apical impulse not displaced, no murmurs, gallops or rubs detected Abdomen: abdomen unremarkable, abdomen soft  Peripheral Pulses: The femoral, popliteal, dorsalis pedis, and posterior tibial pulses are full and equal bilaterally with no bruits auscultated. Extremities/Back : no  edema present Neurological: No gross motor or sensory deficits noted, affect appropriate, oriented to time, person and place.   __     Medications added today by the physician:  doxazosin 4 mg tablet, 1 po qd, 90  losartan 100 mg-hydrochlorothiazide 12.5 mg tablet, 1 po qd, 90  simvastatin 40 mg tablet, 1 po qd, 90     IMPRESSION:  1. High grade AV block  status post Medtronic DDDR pacemaker 12/2003 with generator change   2013.  2. Hypertension.  3. Hyperlipidemia.  4. Sleep apnea.    RECOMMENDATIONS:  1. Continue current program.   2. Routine pacemaker follow-up.  3. Return in one year. After the next visit we will make arrangements for his next nuclear stress   test.     Justin Cisneros. Justin Bowels, MD, [facility]    DMP/ds     cc: Addison Bailey MD    EL   ____________________________  TODAYS ORDERS  Diet_mgmt_edu,_guidance_and_counseling TODAY  Return  Visit 15 MIN 1 year

## 2021-03-01 ENCOUNTER — Other Ambulatory Visit: Payer: Self-pay

## 2021-03-01 ENCOUNTER — Ambulatory Visit (INDEPENDENT_AMBULATORY_CARE_PROVIDER_SITE_OTHER): Payer: Medicare Other | Admitting: Emergency Medicine

## 2021-03-01 DIAGNOSIS — I442 Atrioventricular block, complete: Secondary | ICD-10-CM

## 2021-03-01 LAB — CUP PACEART INCLINIC DEVICE CHECK
Battery Impedance: 2006 Ohm
Battery Remaining Longevity: 31 mo
Battery Voltage: 2.74 V
Brady Statistic AP VP Percent: 9 %
Brady Statistic AP VS Percent: 0 %
Brady Statistic AS VP Percent: 91 %
Brady Statistic AS VS Percent: 0 %
Date Time Interrogation Session: 20220405142718
Implantable Lead Implant Date: 20050208
Implantable Lead Implant Date: 20050208
Implantable Lead Location: 753859
Implantable Lead Location: 753860
Implantable Lead Model: 5076
Implantable Lead Model: 5076
Implantable Pulse Generator Implant Date: 20131008
Lead Channel Impedance Value: 433 Ohm
Lead Channel Impedance Value: 495 Ohm
Lead Channel Pacing Threshold Amplitude: 0.75 V
Lead Channel Pacing Threshold Amplitude: 1.75 V
Lead Channel Pacing Threshold Pulse Width: 0.4 ms
Lead Channel Pacing Threshold Pulse Width: 0.52 ms
Lead Channel Sensing Intrinsic Amplitude: 15.67 mV
Lead Channel Sensing Intrinsic Amplitude: 4 mV
Lead Channel Setting Pacing Amplitude: 2 V
Lead Channel Setting Pacing Amplitude: 2.5 V
Lead Channel Setting Pacing Pulse Width: 0.52 ms
Lead Channel Setting Sensing Sensitivity: 4 mV

## 2021-03-01 NOTE — Progress Notes (Signed)
Pacemaker check in clinic. Normal device function. Thresholds, sensing, impedances consistent with previous measurements. Device programmed to maximize longevity. AT/AF burden <0.1%, no EGM's available. No high ventricular rates noted. Device programmed at appropriate safety margins. Histogram distribution appropriate for patient activity level. Device programmed to optimize intrinsic conduction. Estimated longevity 31 months. Patient scheduled for next in-clinic check on 09/06/21. Patient education completed.

## 2021-06-15 ENCOUNTER — Telehealth: Payer: Self-pay | Admitting: Pharmacist

## 2021-06-15 NOTE — Telephone Encounter (Signed)
Pt called clinic, states he received a letter from Eliquis support that he is nearing 80% of his copay card usage for the year which caps at $6400 (doubled this year). Advised him to continue using his copay card as normal.  Unclear why he has already used this much of the card. Last year had an issue because his insurance doesn't cover much of the Eliquis at all, but they covered even less when he filled at [facility] since it wasn't a preferred pharmacy and [facility] was. [facility].  Called [facility], they stated his insurance is paying $140 of the Eliquis copay each month and that the copay after insurance coverage is $150, copay card is being billed secondary which brings down his copay to $10.  That would mean that the card has covered $980 so far this year, not > $5000 which would be the 80% mark that Eliquis alerted him the card has paid for so far. Called Eliquis 360 support to clarify. They stated the letter was sent out to patients in error. Called pt and left him a message advising him of this.

## 2021-07-18 ENCOUNTER — Telehealth: Payer: Self-pay | Admitting: Internal Medicine

## 2021-07-18 NOTE — Telephone Encounter (Signed)
Pt is calling to speak with the nurse with questions from his PCP

## 2021-07-19 NOTE — Telephone Encounter (Signed)
Spoke with pt who states he has tested positive for Covid.  PCP referred pt to cardiologist re: possibility of receiving monoclonal antibody treatment.  Pt states PCP was unsure due to patient taking Eliquis.  Pt has been started on Doxycycline 100mg  and a Azelastine nasal spray.  Pt's wife was started on Paxlovid. Pt advised Dr is out of the office for the next 2 weeks but will forward request to PharmD team for review. Pt verbalizes understanding and agrees with current plan.

## 2021-07-19 NOTE — Telephone Encounter (Signed)
Attempted phone call to pt and left voicemail message to contact RN at [phone]. 

## 2021-07-19 NOTE — Telephone Encounter (Signed)
Spoke with pt and advised per Efraim Kaufmann, [facility] pt make have the Monoclonal antibody infusion but may not take Paxlovid.  Pt verbalizes understanding and will make his PCP aware.

## 2021-07-19 NOTE — Telephone Encounter (Signed)
Ok to send pt for monoclonal antibody infusion. He should NOT take Paxlovid.

## 2021-09-08 ENCOUNTER — Other Ambulatory Visit: Payer: Self-pay

## 2021-09-08 ENCOUNTER — Ambulatory Visit (INDEPENDENT_AMBULATORY_CARE_PROVIDER_SITE_OTHER): Payer: Medicare Other

## 2021-09-08 DIAGNOSIS — I442 Atrioventricular block, complete: Secondary | ICD-10-CM

## 2021-09-08 NOTE — Progress Notes (Signed)
Pacemaker check in clinic. Normal device function. Thresholds, sensing, impedances consistent with previous measurements. Device programmed to maximize longevity. 11 mode switch episodes, <0.1% AT/AF burden EGM unavailable. No high ventricular rates noted. Device programmed at appropriate safety margins. Histogram distribution appropriate for patient activity level. Device programmed to optimize intrinsic conduction. Estimated longevity 27 months. Patient educated about remote monitoring, patient agreeable to remote monitoring.

## 2021-10-31 ENCOUNTER — Telehealth: Payer: Self-pay | Admitting: Internal Medicine

## 2021-10-31 NOTE — Telephone Encounter (Signed)
Pt c/o medication issue:  1. Name of Medication: ELIQUIS 5 MG TABS tablet  2. How are you currently taking this medication (dosage and times per day)?   3. Are you having a reaction (difficulty breathing--STAT)?   4. What is your medication issue?  PT IS CALLING TO GET APPROVAL TO CONTINUE THIS MEDICINE.   [phone]   CASE # [id number]   COVERAGE EXPIRES ON 12/06/2021

## 2021-11-01 NOTE — Telephone Encounter (Signed)
Called patient back. He wanted to make sure his copay card re-newed next year. Assumed him it would. His insurance will not let him use [facility] next year so he will be using [facility] in  [place]. He said he is going to have [facility] transfer rx. Advised to make sure they transfer copay card. Pt appreciative of the help.

## 2021-11-01 NOTE — Telephone Encounter (Signed)
Spoke with patient regarding remote monitoring.  He indicates he received a remote monitor at last office visit.  He has since decided that he does not want to be monitored at home.    Advised patient if he chooses not to participate in Remote monitoring he will need to come into the office for in-clinic checks every 6 months.  Once his device reach less than 1 year until ERI we will increase frequency to monthly.   At patient's request, cancelled future remote scheduled checks, he will bring monitor to next visit.    Transferred patient to Scheduling to schedule non-urgent visit with Dr. Graciela Husbands.

## 2021-11-01 NOTE — Telephone Encounter (Signed)
**Note De-Identified Jimmy Palmer Obfuscation** The pt is aware that I am going to work on his Elqiuis PA.  He has a lot of questions that I cannot answer concerning his devise checks, Eliquis $10 co-pay card, and him switching to a new pharmacy.  He states he has been given a machine so he can do home monitoring on his devise and he does not want to do this as he wants to come to the office for his devise checks. He wants a call back to discuss.  He is requesting a call back from one of our pharmacist, Efraim Kaufmann or Aundra Millet to call him concerning questions about his Eliquis co-pay card.  I have advised him to have his new pharmacy contact us concerning switching his refills to them.  He thanked me for calling him back.

## 2021-11-02 ENCOUNTER — Telehealth: Payer: Self-pay

## 2021-11-02 NOTE — Telephone Encounter (Signed)
**Note De-Identified Jimmy Palmer Obfuscation** Eliquis PA started through covermymeds per the pts request. Key: Genella Rife  Following message received from covermymeds: Jimmy Palmer KeyGenella Rife - PA Case ID: [id number] Outcome: This request has been approved using information available on the patient's profile.  Type:Prior Auth;Coverage Start Date:10/03/2021;Coverage End Date:11/02/2022 Drug: Eliquis 5MG  tablets Form [employer] Electronic PA Form (2017 NCPDP)  I have not notified the pts pharmacy of this approval as he is in the process of transferring his prescriptions to [facility] from 09-16-2005.

## 2021-11-07 ENCOUNTER — Ambulatory Visit (INDEPENDENT_AMBULATORY_CARE_PROVIDER_SITE_OTHER): Payer: Medicare Other | Admitting: Family Medicine

## 2021-11-07 ENCOUNTER — Other Ambulatory Visit: Payer: Self-pay

## 2021-11-07 ENCOUNTER — Encounter (HOSPITAL_BASED_OUTPATIENT_CLINIC_OR_DEPARTMENT_OTHER): Payer: Self-pay | Admitting: Family Medicine

## 2021-11-07 ENCOUNTER — Other Ambulatory Visit: Payer: Self-pay | Admitting: *Deleted

## 2021-11-07 DIAGNOSIS — I10 Essential (primary) hypertension: Secondary | ICD-10-CM | POA: Diagnosis not present

## 2021-11-07 DIAGNOSIS — N189 Chronic kidney disease, unspecified: Secondary | ICD-10-CM

## 2021-11-07 DIAGNOSIS — I4819 Other persistent atrial fibrillation: Secondary | ICD-10-CM

## 2021-11-07 DIAGNOSIS — N1831 Chronic kidney disease, stage 3a: Secondary | ICD-10-CM | POA: Diagnosis not present

## 2021-11-07 HISTORY — DX: Chronic kidney disease, unspecified: N18.9

## 2021-11-07 NOTE — Telephone Encounter (Addendum)
Eliquis 5mg  paper refill request received. Patient is 80 years old, weight-79.3kg, Crea-1.40 on 09/24/2020, Diagnosis-Afib, and last seen by Dr. 09/26/2020 on 10/29/2020 and pending an appt on 12/05/2021 with Dr. 02/02/2022.  Called PCP to request labs & left a message to call back or fax recent CBC/BMET or CMET. If the pt has no labs then will order to be obtained at Cardiology appt.   Refill sent & labs ordered for collection on 12/05/2021; will make a lab appt for same day as Cardiology Appt & update pt.   Pt saw PCP on yesterday and labs were drawn. Called pt and made aware a refill was sent.    80 years old, wt-82.3kg, Crea-1.36 on 11/07/2021, last seen by Dr. 14/10/2021 on 10/29/2020 and pending an appt on 12/05/2021 with Dr. 02/02/2022. Dose is appropriate based on dosing criteria.

## 2021-11-07 NOTE — Progress Notes (Signed)
New Patient Office Visit  Subjective:  Patient ID: Jimmy Palmer, male    DOB: July 29, 1941  Age: 80 y.o. MRN: [id number]  CC:  Chief Complaint  Patient presents with   Establish Care    Prior PCP Dr. Ernie Hew. No specific concerns today    HPI Jimmy Palmer is a 80 yo male presenting to establish in clinic.  Denies any specific concerns today.  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  Hypertension, atrial fibrillation, pacemaker: Patient does follow with cardiology.  Current medications include Eliquis, losartan-hydrochlorothiazide.  Denies any issues with chest pain, headaches, lightheadedness or dizziness at present.  Last saw cardiologist in December 2021.  Hyperlipidemia: Currently managed on simvastatin.  Has not had recent lab check.  Depression: Currently taking sertraline and bupropion with good control of symptoms.  PHQ-9 completed today with total score of 2.  Patient is a retired Contractor.  Past Medical History:  Diagnosis Date   Abnormal electrocardiogram    Abnormal heart sounds    Allergy 2020   upon arrival to [facility]   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   Chronic kidney disease 11/07/2021   Depression 2020   receiving appropriate therapy   Herniated lumbar intervertebral disc    Hypertension    Left bundle branch block    Pacemaker    Pure hypercholesterolemia    Sick sinus syndrome (HCC)    Sleep apnea    VT (ventricular tachycardia)     Past Surgical History:  Procedure Laterality Date   CARDIOVERSION N/A 09/28/2020   Procedure: CARDIOVERSION;  Surgeon: Nahser, Wonda Cheng, MD;  Location: [facility];  Service: Cardiovascular;  Laterality: N/A;   INSERT / REPLACE / REMOVE PACEMAKER     JOINT REPLACEMENT     partial right knee, medial side   REPLACEMENT TOTAL KNEE     VASECTOMY      Family History   Problem Relation Age of Onset   Arthritis Mother    Hypertension Mother    Hypertension Father     Social History   Socioeconomic History   Marital status: Married    Spouse name: Not on file   Number of children: Not on file   Years of education: Not on file   Highest education level: Not on file  Occupational History   Not on file  Tobacco Use   Smoking status: Former   Smokeless tobacco: Never  Vaping Use   Vaping Use: Never used  Substance and Sexual Activity   Alcohol use: Yes    Alcohol/week: 24.0 standard drinks    Types: 14 Glasses of wine, 10 Shots of liquor per week   Drug use: Never   Sexual activity: Not Currently  Other Topics Concern   Not on file  Social History Narrative   Retired Regulatory affairs officer   Recently relocated from [place] Strain: Not on file  Food Insecurity: Not on file  Transportation Needs: Not on file  Physical Activity: Not on file  Stress: Not on file  Social Connections: Not on file  Intimate Partner Violence: Not on file    Objective:   Today's Vitals: BP 138/70   Pulse 94   Ht 5\' 9"  (1.753 m)   Wt 181 lb 6.4 oz (82.3 kg)   SpO2 98%  BMI 26.79 kg/m   Physical Exam  80 year old male in no acute distress Cardiovascular exam with regular rate and rhythm, no murmur appreciated Lungs clear to auscultation bilaterally  Assessment & Plan:   Problem List Items Addressed This Visit       Cardiovascular and Mediastinum   Hypertension    Blood pressure control adequate in office today Can continue with current regimen of losartan-hydrochlorothiazide Recommend intermittent monitoring at home, DASH diet      Relevant Orders   CBC with Differential/Platelet (Completed)   Comprehensive metabolic panel (Completed)   Lipid panel (Completed)     Genitourinary   Chronic kidney disease    Noted on prior labs, will complete labs in order to assess current status       Relevant Orders   CBC with Differential/Platelet (Completed)   Comprehensive metabolic panel (Completed)    Outpatient Encounter Medications as of 11/07/2021  Medication Sig   buPROPion (WELLBUTRIN SR) 150 MG 12 hr tablet Take 150 mg by mouth daily.   diclofenac Sodium (VOLTAREN) 1 % GEL Apply 1 application topically as needed (pain).    diphenhydrAMINE (BENADRYL) 25 mg capsule Take 25 mg by mouth at bedtime.    doxazosin (CARDURA) 4 MG tablet Take 4 mg by mouth daily.   losartan-hydrochlorothiazide (HYZAAR) 100-12.5 MG tablet Take 1 tablet by mouth daily.   Melatonin 5 MG TABS Take 5 mg by mouth at bedtime.    Multiple Vitamin (MULTIVITAMIN PO) Take 1 tablet by mouth once a week.    sertraline (ZOLOFT) 25 MG tablet Take 25 mg by mouth daily.   simvastatin (ZOCOR) 40 MG tablet Take 1 tablet (40 mg total) by mouth daily.   [DISCONTINUED] ELIQUIS 5 MG TABS tablet TAKE 1 TABLET TWICE DAILY.   No facility-administered encounter medications on file as of 11/07/2021.    Follow-up: Return in about 6 months (around 05/08/2022) for Follow Up.   Martavious Hartel J De Peru, MD

## 2021-11-07 NOTE — Patient Instructions (Signed)
  Medication Instructions:  Your physician recommends that you continue on your current medications as directed. Please refer to the Current Medication list given to you today. --If you need a refill on any your medications before your next appointment, please call your pharmacy first. If no refills are authorized on file call the office.-- Lab Work: Your physician has recommended that you have lab work today: CBC, CMP, and Lipid If you have labs (blood work) drawn today and your tests are completely normal, you will receive your results via MyChart message OR a phone call from our staff.  Please ensure you check your voicemail in the event that you authorized detailed messages to be left on a delegated number. If you have any lab test that is abnormal or we need to change your treatment, we will call you to review the results.  Follow-Up: Your next appointment:   Your physician recommends that you schedule a follow-up appointment in: 4-6 MONTHS with Dr. de Peru  You will receive a text message or e-mail with a link to a survey about your care and experience with [place] today! We would greatly appreciate your feedback!   Thanks for letting us be apart of your health journey!!  Primary Care and Sports Medicine   Dr. Ceasar Mons Peru   We encourage you to activate your patient portal called "MyChart".  Sign up information is provided on this After Visit Summary.  MyChart is used to connect with patients for Virtual Visits (Telemedicine).  Patients are able to view lab/test results, encounter notes, upcoming appointments, etc.  Non-urgent messages can be sent to your provider as well. To learn more about what you can do with MyChart, please visit --  [email or web address].

## 2021-11-07 NOTE — Telephone Encounter (Signed)
Pt switching pharmacy, would like a new Rx sent to new pharmacy. Please address

## 2021-11-08 ENCOUNTER — Encounter (HOSPITAL_BASED_OUTPATIENT_CLINIC_OR_DEPARTMENT_OTHER): Payer: Self-pay | Admitting: Family Medicine

## 2021-11-08 LAB — COMPREHENSIVE METABOLIC PANEL
ALT: 21 IU/L (ref 0–44)
AST: 19 IU/L (ref 0–40)
Albumin/Globulin Ratio: 1.6 (ref 1.2–2.2)
Albumin: 4.4 g/dL (ref 3.7–4.7)
Alkaline Phosphatase: 109 IU/L (ref 44–121)
BUN/Creatinine Ratio: 18 (ref 10–24)
BUN: 25 mg/dL (ref 8–27)
Bilirubin Total: 0.3 mg/dL (ref 0.0–1.2)
CO2: 21 mmol/L (ref 20–29)
Calcium: 9.2 mg/dL (ref 8.6–10.2)
Chloride: 97 mmol/L (ref 96–106)
Creatinine, Ser: 1.36 mg/dL — ABNORMAL HIGH (ref 0.76–1.27)
Globulin, Total: 2.7 g/dL (ref 1.5–4.5)
Glucose: 99 mg/dL (ref 70–99)
Potassium: 4.3 mmol/L (ref 3.5–5.2)
Sodium: 133 mmol/L — ABNORMAL LOW (ref 134–144)
Total Protein: 7.1 g/dL (ref 6.0–8.5)
eGFR: 53 mL/min/{1.73_m2} — ABNORMAL LOW (ref >59)

## 2021-11-08 LAB — CBC WITH DIFFERENTIAL/PLATELET
Basophils Absolute: 0 10*3/uL (ref 0.0–0.2)
Basos: 0 %
EOS (ABSOLUTE): 0.1 10*3/uL (ref 0.0–0.4)
Eos: 1 %
Hematocrit: 41 % (ref 37.5–51.0)
Hemoglobin: 13.5 g/dL (ref 13.0–17.7)
Immature Grans (Abs): 0 10*3/uL (ref 0.0–0.1)
Immature Granulocytes: 0 %
Lymphocytes Absolute: 1.5 10*3/uL (ref 0.7–3.1)
Lymphs: 15 %
MCH: 30.9 pg (ref 26.6–33.0)
MCHC: 32.9 g/dL (ref 31.5–35.7)
MCV: 94 fL (ref 79–97)
Monocytes Absolute: 1 10*3/uL — ABNORMAL HIGH (ref 0.1–0.9)
Monocytes: 10 %
Neutrophils Absolute: 7.1 10*3/uL — ABNORMAL HIGH (ref 1.4–7.0)
Neutrophils: 74 %
Platelets: 235 10*3/uL (ref 150–450)
RBC: 4.37 x10E6/uL (ref 4.14–5.80)
RDW: 12 % (ref 11.6–15.4)
WBC: 9.7 10*3/uL (ref 3.4–10.8)

## 2021-11-08 LAB — LIPID PANEL
Chol/HDL Ratio: 2.6 ratio (ref 0.0–5.0)
Cholesterol, Total: 185 mg/dL (ref 100–199)
HDL: 72 mg/dL (ref >39)
LDL Chol Calc (NIH): 84 mg/dL (ref 0–99)
Triglycerides: 171 mg/dL — ABNORMAL HIGH (ref 0–149)
VLDL Cholesterol Cal: 29 mg/dL (ref 5–40)

## 2021-11-08 MED ORDER — APIXABAN 5 MG PO TABS
5.0000 mg | ORAL_TABLET | Freq: Two times a day (BID) | ORAL | 0 refills | Status: DC
Start: 2021-11-08 — End: 2022-02-21

## 2021-12-05 ENCOUNTER — Ambulatory Visit (INDEPENDENT_AMBULATORY_CARE_PROVIDER_SITE_OTHER): Payer: Medicare Other | Admitting: Internal Medicine

## 2021-12-05 ENCOUNTER — Other Ambulatory Visit: Payer: 59

## 2021-12-05 ENCOUNTER — Other Ambulatory Visit: Payer: Self-pay

## 2021-12-05 ENCOUNTER — Encounter: Payer: Self-pay | Admitting: Internal Medicine

## 2021-12-05 VITALS — BP 132/70 | HR 98 | Ht 69.0 in | Wt 180.4 lb

## 2021-12-05 DIAGNOSIS — I442 Atrioventricular block, complete: Secondary | ICD-10-CM

## 2021-12-05 DIAGNOSIS — I4819 Other persistent atrial fibrillation: Secondary | ICD-10-CM | POA: Diagnosis not present

## 2021-12-05 DIAGNOSIS — I4719 Other supraventricular tachycardia: Secondary | ICD-10-CM

## 2021-12-05 DIAGNOSIS — I471 Supraventricular tachycardia: Secondary | ICD-10-CM | POA: Diagnosis not present

## 2021-12-05 DIAGNOSIS — Z950 Presence of cardiac pacemaker: Secondary | ICD-10-CM | POA: Diagnosis not present

## 2021-12-05 NOTE — Progress Notes (Signed)
ELECTROPHYSIOLOGY OFFICE  NOTE  Patient ID: Jimmy Palmer, MRN: [id number], DOB/AGE: Apr 08, 1941 81 y.o. Admit date: (Not on file) Date of Consult: 12/05/2021  Primary Physician: [name], Blondell Reveal, MD Primary Cardiologist    HPI Jimmy Palmer is a 81 y.o. 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  Anticoagulation with Apixoban     When last seen, persistent atrial fibrillation/flutter noted.  Cardioversion undertaken.  Symptoms much improved with less fatigue.  While not able to have identified symptoms associated with the fibrillation/flutter, noted a significant difference following cardioversion. No interval atrial fibrillation   He has been maintaining regular rhythm and feeling better. He is compliant in taking his blood thinner. He endorses occasional bleeding due to hemorrhoids but they are being monitored.  But none other  He received a self-monitoring device. However, he preferred to be seen in person and has not used the device.   The patient denies chest pain, shortness of breath, nocturnal dyspnea, orthopnea or peripheral edema.  There have been no palpitations, lightheadedness or syncope.  Date Cr K Hgb  9/19 1.08 4.2     1/21 1.33 4.5 13.6  12/22 1.36 4.3 13.5    DATE TEST EF   8/14 Echo   60 %   4/16 MYOVIEW   56 %  normal perfusion        Thromboembolic risk factors ( age  -2, HTN-1) for a CHADSVASc Score of >=3   Past Medical History:  Diagnosis Date   Abnormal electrocardiogram    Abnormal heart sounds    Allergy 2020   upon arrival [facility]   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   Chronic kidney disease 11/07/2021   Depression 2020   receiving appropriate therapy   Herniated lumbar intervertebral disc    Hypertension    Left bundle branch block    Pacemaker    Pure hypercholesterolemia    Sick  sinus syndrome ([place])    Sleep apnea    VT (ventricular tachycardia)       Surgical History:  Past Surgical History:  Procedure Laterality Date   CARDIOVERSION N/A 09/28/2020   Procedure: CARDIOVERSION;  Surgeon: Thayer Headings, MD;  Location: [place];  Service: Cardiovascular;  Laterality: N/A;   INSERT / REPLACE / REMOVE PACEMAKER     JOINT REPLACEMENT     partial right knee, medial side   REPLACEMENT TOTAL KNEE     VASECTOMY       Current Meds  Medication Sig   apixaban (ELIQUIS) 5 MG TABS tablet Take 1 tablet (5 mg total) by mouth 2 (two) times daily.   buPROPion (WELLBUTRIN SR) 150 MG 12 hr tablet Take 150 mg by mouth daily.   diclofenac Sodium (VOLTAREN) 1 % GEL Apply 1 application topically as needed (pain).    diphenhydrAMINE (BENADRYL) 25 mg capsule Take 25 mg by mouth at bedtime.    doxazosin (CARDURA) 4 MG tablet Take 4 mg by mouth daily.   losartan-hydrochlorothiazide (HYZAAR) 100-12.5 MG tablet Take 1 tablet by mouth daily.   Melatonin 5 MG TABS Take 5 mg by mouth at bedtime.    Multiple Vitamin (MULTIVITAMIN PO) Take 1 tablet by mouth once a week.    sertraline (ZOLOFT) 25 MG tablet Take 25 mg by mouth daily.  simvastatin (ZOCOR) 40 MG tablet Take 1 tablet (40 mg total) by mouth daily.     Allergies: No Known Allergies       All other systems reviewed and negative.   BP 132/70    Pulse 98    Ht 5\' 9"  (1.753 m)    Wt 180 lb 6.4 oz (81.8 kg)    SpO2 98%    BMI 26.64 kg/m   Well developed and well nourished in no acute distress HENT normal Neck supple with JVP-flat Clear Device pocket well healed; without hematoma or erythema.  There is no tethering  Regular rate and rhythm, no gallop No murmur Abd-soft with active BS No Clubbing cyanosis no edema Skin-warm and dry A & Oriented  Grossly normal sensory and motor function  ECG sinus with P synchronous pacing at 98 Oh 16/18/43 No interval atrial fibrillation.  Assessment and Plan:   Complete heart  block  Pacemaker Medtronic       Atrial tach  Hypertension  Pocket Tethering   Atrial fibrillation persistent   ETOH use acknowledges less   No interval atrial fibrillation.  We will continue on Eliquis 5 mg twice daily.  He is euvolemic.  Continue on his hydrochlorothiazide 12.5 mg daily.  Blood pressure well controlled continue losartan 100.  Discussed end-of-life behavior of his pacemaker.  He had some concerns given that he is device dependent.  We discussed the value of remote monitoring.    I,Mykaella Javier,acting as a scribe for Virl Axe, MD.,have documented all relevant documentation on the behalf of Virl Axe, MD,as directed by  Virl Axe, MD while in the presence of Virl Axe, MD.  I, Virl Axe, MD, have reviewed all documentation for this visit. The documentation on 12/05/21 for the exam, diagnosis, procedures, and orders are all accurate and complete.     Mykaella Garlon Hatchet

## 2021-12-05 NOTE — Patient Instructions (Signed)
Medication Instructions:  Your physician recommends that you continue on your current medications as directed. Please refer to the Current Medication list given to you today.  *If you need a refill on your cardiac medications before your next appointment, please call your pharmacy*   Lab Work: None ordered.  If you have labs (blood work) drawn today and your tests are completely normal, you will receive your results only by: MyChart Message (if you have MyChart) OR A paper copy in the mail If you have any lab test that is abnormal or we need to change your treatment, we will call you to review the results.   Testing/Procedures: None ordered.    Follow-Up: At [facility], you and your health needs are our priority.  As part of our continuing mission to provide you with exceptional heart care, we have created designated Provider Care Teams.  These Care Teams include your primary Cardiologist (physician) and Advanced Practice Providers (APPs -  Physician Assistants and Nurse Practitioners) who all work together to provide you with the care you need, when you need it.  We recommend signing up for the patient portal called "MyChart".  Sign up information is provided on this After Visit Summary.  MyChart is used to connect with patients for Virtual Visits (Telemedicine).  Patients are able to view lab/test results, encounter notes, upcoming appointments, etc.  Non-urgent messages can be sent to your provider as well.   To learn more about what you can do with MyChart, go to [email or web address].    Your next appointment:   6 months with device clinic

## 2021-12-08 LAB — CUP PACEART INCLINIC DEVICE CHECK
Battery Impedance: 2638 Ohm
Battery Remaining Longevity: 24 mo
Battery Voltage: 2.71 V
Brady Statistic AP VP Percent: 9 %
Brady Statistic AP VS Percent: 0 %
Brady Statistic AS VP Percent: 89 %
Brady Statistic AS VS Percent: 1 %
Date Time Interrogation Session: 20230109150000
Implantable Lead Implant Date: 20050208
Implantable Lead Implant Date: 20050208
Implantable Lead Location: 753859
Implantable Lead Location: 753860
Implantable Lead Model: 5076
Implantable Lead Model: 5076
Implantable Pulse Generator Implant Date: 20131008
Lead Channel Impedance Value: 452 Ohm
Lead Channel Impedance Value: 517 Ohm
Lead Channel Pacing Threshold Amplitude: 1 V
Lead Channel Pacing Threshold Amplitude: 1 V
Lead Channel Pacing Threshold Amplitude: 1.25 V
Lead Channel Pacing Threshold Amplitude: 1.375 V
Lead Channel Pacing Threshold Pulse Width: 0.4 ms
Lead Channel Pacing Threshold Pulse Width: 0.4 ms
Lead Channel Pacing Threshold Pulse Width: 0.4 ms
Lead Channel Pacing Threshold Pulse Width: 0.52 ms
Lead Channel Sensing Intrinsic Amplitude: 15.67 mV
Lead Channel Sensing Intrinsic Amplitude: 4 mV
Lead Channel Setting Pacing Amplitude: 2 V
Lead Channel Setting Pacing Amplitude: 2.5 V
Lead Channel Setting Pacing Pulse Width: 0.52 ms
Lead Channel Setting Sensing Sensitivity: 4 mV

## 2021-12-27 ENCOUNTER — Telehealth: Payer: Self-pay | Admitting: Internal Medicine

## 2021-12-27 NOTE — Telephone Encounter (Signed)
°*  STAT* If patient is at the pharmacy, call can be transferred to refill team.   1. Which medications need to be refilled? (please list name of each medication and dose if known) Eliquis  2. Which pharmacy/location (including street and city if local pharmacy) is medication to be sent to? [facility], [place],  [phone]  3. Do they need a 30 day or 90 day supply? Need this asap please, he is just about out of it.   Patient said the pharmacist told him to ask  for another co pay card  to be attached to tis prescription. He said otherwise he can not afford the Eliquis.

## 2021-12-27 NOTE — Telephone Encounter (Signed)
The patient and I called ([employer]) eliquis copay savings and was able to get them approved

## 2021-12-27 NOTE — Telephone Encounter (Signed)
Spoke with patient.  Copay card on file was expired.  Attempted to activate new copay card for patient and was instructed to call [phone]-Eliquis. When I spoke with the representative they told the patient needed to call to allow release of information.  Called patient and gave him the 1-800-eliquis phone number.

## 2022-02-01 ENCOUNTER — Ambulatory Visit (INDEPENDENT_AMBULATORY_CARE_PROVIDER_SITE_OTHER): Payer: Medicare Other | Admitting: Family Medicine

## 2022-02-01 ENCOUNTER — Encounter (HOSPITAL_BASED_OUTPATIENT_CLINIC_OR_DEPARTMENT_OTHER): Payer: Self-pay | Admitting: Family Medicine

## 2022-02-01 ENCOUNTER — Other Ambulatory Visit: Payer: Self-pay

## 2022-02-01 VITALS — BP 122/80 | HR 96 | Ht 69.0 in | Wt 179.0 lb

## 2022-02-01 DIAGNOSIS — K625 Hemorrhage of anus and rectum: Secondary | ICD-10-CM

## 2022-02-01 DIAGNOSIS — J3089 Other allergic rhinitis: Secondary | ICD-10-CM | POA: Diagnosis not present

## 2022-02-01 DIAGNOSIS — Z1283 Encounter for screening for malignant neoplasm of skin: Secondary | ICD-10-CM | POA: Diagnosis not present

## 2022-02-01 DIAGNOSIS — M25552 Pain in left hip: Secondary | ICD-10-CM | POA: Diagnosis not present

## 2022-02-01 MED ORDER — PHENYLEPHRINE IN HARD FAT 0.25 % RE SUPP: 1.0000 | Freq: Two times a day (BID) | RECTAL | 1 refills | Status: DC | PRN

## 2022-02-01 MED ORDER — TRIAMCINOLONE ACETONIDE 55 MCG/ACT NA AERO: 2.0000 | INHALATION_SPRAY | Freq: Every day | NASAL | 2 refills | Status: DC

## 2022-02-01 MED ORDER — FEXOFENADINE HCL 180 MG PO TABS: 180.0000 mg | ORAL_TABLET | Freq: Every day | ORAL | 1 refills | Status: DC

## 2022-02-01 NOTE — Patient Instructions (Signed)
?  Medication Instructions:  ?Your physician recommends that you continue on your current medications as directed. Please refer to the Current Medication list given to you today. ?--If you need a refill on any your medications before your next appointment, please call your pharmacy first. If no refills are authorized on file call the office.-- ? ? ?Referrals/Procedures/Imaging: ?[facility] ?Dr Steward Drone ? ?Follow-Up: ?Your next appointment:   ?Your physician recommends that you schedule a follow-up appointment in: Keep appointment in June with Dr. de Peru ? ?You will receive a text message or e-mail with a link to a survey about your care and experience with [place] today! We would greatly appreciate your feedback!  ? ?Thanks for letting us be apart of your health journey!!  ?Primary Care and Sports Medicine  ? ?Dr. Marcy Salvo de Peru  ? ?We encourage you to activate your patient portal called "MyChart".  Sign up information is provided on this After Visit Summary.  MyChart is used to connect with patients for Virtual Visits (Telemedicine).  Patients are able to view lab/test results, encounter notes, upcoming appointments, etc.  Non-urgent messages can be sent to your provider as well. To learn more about what you can do with MyChart, please visit --  [email or web address].   ? ?

## 2022-02-01 NOTE — Assessment & Plan Note (Signed)
Patient reports ongoing, chronic left hip pain.  Pain originally began when lifting 65 pound bags of river stones.  This led to both left hip pain as well as some low back pain.  In the past he has received epidurals and back when he was living in the [place] area.  He feels that his left hip pain has been progressively worsening recently and is interested in evaluation with orthopedic surgeon ?We will refer patient to Dr. Steward Drone downstairs for further evaluation of his hip and recommendations regarding treatments ?

## 2022-02-01 NOTE — Assessment & Plan Note (Signed)
Noted on prior labs, will complete labs in order to assess current status ?

## 2022-02-01 NOTE — Assessment & Plan Note (Signed)
Blood pressure control adequate in office today ?Can continue with current regimen of losartan-hydrochlorothiazide ?Recommend intermittent monitoring at home, DASH diet ?

## 2022-02-01 NOTE — Progress Notes (Signed)
? ? ?  Procedures performed today:   ? ?None. ? ?Independent interpretation of notes and tests performed by another provider:  ? ?None. ? ?Brief History, Exam, Impression, and Recommendations:   ? ?BP 122/80   Pulse 96   Ht 5\' 9"  (1.753 m)   Wt 179 lb (81.2 kg)   SpO2 97%   BMI 26.43 kg/m?  ? ?Rectal bleeding ?Patient with known internal hemorrhoids, and also with anticoagulation via Eliquis.  He has noted recently that he has had some rectal bleeding.  Does not have any blood in the stool, reports that he notices some blood when wiping.  Denies any significant pain at present.  Reports having prior colonoscopy which was within the last few years or so, however his wife who is with him feels that it has been longer.  He is not sure when he was recommended to have repeat colonoscopy completed.  He is otherwise not had any other systemic symptoms ?Patient has been using OTC medication but has not had notable relief as of yet ?We will try to control symptoms with use of phenylephrine 0.25% rectally ?If symptoms not improving with above, likely will arrange for evaluation with GI to further investigate ? ?Left hip pain ?Patient reports ongoing, chronic left hip pain.  Pain originally began when lifting 65 pound bags of river stones.  This led to both left hip pain as well as some low back pain.  In the past he has received epidurals and back when he was living in the [place] area.  He feels that his left hip pain has been progressively worsening recently and is interested in evaluation with orthopedic surgeon ?We will refer patient to Dr. [place] downstairs for further evaluation of his hip and recommendations regarding treatments ? ?Environmental and seasonal allergies ?Patient has been having intermittent cough, postnasal drip, sinus congestion.  He feels it is related to allergies as he is not used to the area and the level of pollen that has been present recently.  He has not tried any notable OTC measures  to help with symptoms, occasionally will use cough lozenges trying to calm down symptoms.  At times he has used Benadryl in the past. ?Discussed options with patient, will proceed with use of antihistamine, prescription for Allegra sent to pharmacy on file.  Given symptoms, feel that use of Nasacort would also be reasonable, prescription sent to pharmacy.  Advised patient on comparing prices between prescription and OTC medications and choosing the cheaper option. ?Also recommend use of nasal saline spray, instructed on use of this ?Recommend monitoring symptoms, if not improving with above measures, recommend returning to clinic in about 4 to 6 weeks to reassess and determine additional measures to be taken ? ?Patient also requesting referral to dermatology for skin cancer screening, referral placed today ?We will plan for follow-up at previously scheduled appointment in June ? ? ?___________________________________________ ?Rubens Cranston de July, MD, ABFM, CAQSM ?Primary Care and Sports Medicine ?[facility] ?

## 2022-02-01 NOTE — Assessment & Plan Note (Signed)
Patient has been having intermittent cough, postnasal drip, sinus congestion.  He feels it is related to allergies as he is not used to the area and the level of pollen that has been present recently.  He has not tried any notable OTC measures to help with symptoms, occasionally will use cough lozenges trying to calm down symptoms.  At times he has used Benadryl in the past. ?Discussed options with patient, will proceed with use of antihistamine, prescription for Allegra sent to pharmacy on file.  Given symptoms, feel that use of Nasacort would also be reasonable, prescription sent to pharmacy.  Advised patient on comparing prices between prescription and OTC medications and choosing the cheaper option. ?Also recommend use of nasal saline spray, instructed on use of this ?Recommend monitoring symptoms, if not improving with above measures, recommend returning to clinic in about 4 to 6 weeks to reassess and determine additional measures to be taken ?

## 2022-02-01 NOTE — Assessment & Plan Note (Signed)
Patient with known internal hemorrhoids, and also with anticoagulation via Eliquis.  He has noted recently that he has had some rectal bleeding.  Does not have any blood in the stool, reports that he notices some blood when wiping.  Denies any significant pain at present.  Reports having prior colonoscopy which was within the last few years or so, however his wife who is with him feels that it has been longer.  He is not sure when he was recommended to have repeat colonoscopy completed.  He is otherwise not had any other systemic symptoms ?Patient has been using OTC medication but has not had notable relief as of yet ?We will try to control symptoms with use of phenylephrine 0.25% rectally ?If symptoms not improving with above, likely will arrange for evaluation with GI to further investigate ?

## 2022-02-08 ENCOUNTER — Ambulatory Visit (INDEPENDENT_AMBULATORY_CARE_PROVIDER_SITE_OTHER): Payer: Medicare Other | Admitting: Orthopaedic Surgery

## 2022-02-08 ENCOUNTER — Other Ambulatory Visit: Payer: Self-pay

## 2022-02-08 ENCOUNTER — Other Ambulatory Visit (HOSPITAL_BASED_OUTPATIENT_CLINIC_OR_DEPARTMENT_OTHER): Payer: Self-pay | Admitting: Orthopaedic Surgery

## 2022-02-08 ENCOUNTER — Ambulatory Visit (HOSPITAL_BASED_OUTPATIENT_CLINIC_OR_DEPARTMENT_OTHER)
Admission: RE | Admit: 2022-02-08 | Discharge: 2022-02-08 | Disposition: A | Discharge status: Home / Self Care | Payer: Medicare Other | Source: Ambulatory Visit | Attending: Orthopaedic Surgery | Admitting: Orthopaedic Surgery

## 2022-02-08 DIAGNOSIS — M25552 Pain in left hip: Secondary | ICD-10-CM | POA: Diagnosis present

## 2022-02-08 DIAGNOSIS — M1612 Unilateral primary osteoarthritis, left hip: Secondary | ICD-10-CM

## 2022-02-08 NOTE — Progress Notes (Signed)
? ?                            ? ? ?Chief Complaint: Left hip pain ?  ? ? ?History of Present Illness:  ? ? ?Jimmy Palmer is a 81 y.o. male presents today as a referral from Dr. [name].  He has had left hip pain for several years now.  He states that he has been previously treated for his back as well.  He has had 2 epidural injections.  He had previously had a steroid injection into the hip several months prior which gave him absolutely no relief.  He states that his pain is located about the groin and posterior buttock area.  He has tried Voltaren gel as well as Tylenol.  He states this this is intermittent although significantly bothering him and keeping him from being active.  He was told that he previously had had arthritis although he is hoping to figure out to what extent.  He has previously worked in [employer] and is now retired ? ? ? ?Surgical History:   ?None ? ?PMH/PSH/Family History/Social History/Meds/Allergies:   ? ?Past Medical History:  ?Diagnosis Date  ? Abnormal electrocardiogram   ? Abnormal heart sounds   ? Allergy 2020  ? upon arrival to [facility]  ? Arthritis   ? lumbar area of back & left hip  ? Chronic kidney disease 11/07/2021  ? Depression 2020  ? receiving appropriate therapy  ? Herniated lumbar intervertebral disc   ? Hypertension   ? Left bundle branch block   ? Pacemaker   ? Pure hypercholesterolemia   ? Sick sinus syndrome ([place])   ? Sleep apnea   ? VT (ventricular tachycardia)   ? ?Past Surgical History:  ?Procedure Laterality Date  ? CARDIOVERSION N/A 09/28/2020  ? Procedure: CARDIOVERSION;  Surgeon: Acie Fredrickson Wonda Cheng, MD;  Location: [place];  Service: Cardiovascular;  Laterality: N/A;  ? INSERT / REPLACE / REMOVE PACEMAKER    ? JOINT REPLACEMENT    ? partial right knee, medial side  ? REPLACEMENT TOTAL KNEE    ? VASECTOMY    ? ?Social History  ? ?Socioeconomic History  ? Marital status: Married  ?  Spouse name: Not on file  ? Number of children: Not on file  ? Years of education: Not on  file  ? Highest education level: Not on file  ?Occupational History  ? Not on file  ?Tobacco Use  ? Smoking status: Former  ? Smokeless tobacco: Never  ?Vaping Use  ? Vaping Use: Never used  ?Substance and Sexual Activity  ? Alcohol use: Yes  ?  Alcohol/week: 24.0 standard drinks  ?  Types: 14 Glasses of wine, 10 Shots of liquor per week  ? Drug use: Never  ? Sexual activity: Not Currently  ?Other Topics Concern  ? Not on file  ?Social History Narrative  ? Retired [employer]  ? Recently relocated from [place]  ? ?Social Determinants of Health  ? ?Financial Resource Strain: Not on file  ?Food Insecurity: Not on file  ?Transportation Needs: Not on file  ?Physical Activity: Not on file  ?Stress: Not on file  ?Social Connections: Not on file  ? ?Family History  ?Problem Relation Age of Onset  ? Arthritis Mother   ? Hypertension Mother   ? Hypertension Father   ? ?No Known Allergies ?Current Outpatient Medications  ?Medication Sig Dispense Refill  ?  apixaban (ELIQUIS) 5 MG TABS tablet Take 1 tablet (5 mg total) by mouth 2 (two) times daily. 180 tablet 0  ? buPROPion (WELLBUTRIN SR) 150 MG 12 hr tablet Take 150 mg by mouth daily.    ? diclofenac Sodium (VOLTAREN) 1 % GEL Apply 1 application topically as needed (pain).     ? diphenhydrAMINE (BENADRYL) 25 mg capsule Take 25 mg by mouth at bedtime.     ? doxazosin (CARDURA) 4 MG tablet Take 4 mg by mouth daily.    ? fexofenadine (ALLEGRA) 180 MG tablet Take 1 tablet (180 mg total) by mouth daily. 90 tablet 1  ? losartan-hydrochlorothiazide (HYZAAR) 100-12.5 MG tablet Take 1 tablet by mouth daily. 90 tablet 3  ? Melatonin 5 MG TABS Take 5 mg by mouth at bedtime.     ? Multiple Vitamin (MULTIVITAMIN PO) Take 1 tablet by mouth once a week.     ? phenylephrine (,USE FOR PREPARATION-H,) 0.25 % suppository Place 1 suppository rectally 2 (two) times daily as needed for hemorrhoids. 12 suppository 1  ? sertraline (ZOLOFT) 25 MG tablet Take 25 mg by mouth daily.    ? simvastatin  (ZOCOR) 40 MG tablet Take 1 tablet (40 mg total) by mouth daily. 90 tablet 3  ? triamcinolone (NASACORT) 55 MCG/ACT AERO nasal inhaler Place 2 sprays into the nose daily. 1 each 2  ? ?No current facility-administered medications for this visit.  ? ?No results found. ? ?Review of Systems:   ?A ROS was performed including pertinent positives and negatives as documented in the HPI. ? ?Physical Exam :   ?Constitutional: NAD and appears stated age ?Neurological: Alert and oriented ?Psych: Appropriate affect and cooperative ?There were no vitals taken for this visit.  ? ?Comprehensive Musculoskeletal Exam:   ? ?Inspection Right Left  ?Skin No atrophy or gross abnormalities appreciated No atrophy or gross abnormalities appreciated  ?Palpation    ?Tenderness None None  ?Crepitus None None  ?Range of Motion    ?Flexion (passive) 120 120  ?Extension 30 30  ?IR 15 15  ?ER 35 35  ?Strength    ?Flexion  5/5 5/5  ?Extension 5/5 5/5  ?Special Tests    ?FABER Negative Negative  ?FADIR Negative Negative  ?ER Lag/Capsular Insufficiency Negative Negative  ?Instability Negative Negative  ?Sacroiliac pain Negative  Negative   ?Instability    ?Generalized Laxity No No  ?Neurologic    ?sciatic, femoral, obturator nerves intact to light sensation  ?Vascular/Lymphatic    ?DP pulse 2+ 2+  ?Lumbar Exam    ?Patient has symmetric lumbar range of motion with negative pain referral to hip  ? ? ? ?Imaging:   ?Xray (3 views left hip): ?He has end-stage left hip osteoarthritis ? ? ?I personally reviewed and interpreted the radiographs. ? ? 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  He is hoping to be more active and this hip is significantly limiting his motion.  This is limiting his ability to be more active.  I discussed that I do believe he would be a good candidate for hip arthroplasty.  Given the severity of the arthritis I not believe that injections would be helpful for him at this point.  He would  like to consider hip arthroplasty as result I will plan for referral to Dr. Ninfa Linden ? ?Plan :   ? ?-Plan for referral to Dr. Ninfa Linden for left hip arthroplasty ? ? ? ? ? ?I personally saw and evaluated  the patient, and participated in the management and treatment plan. ? ?Vanetta Mulders, MD ?Attending Physician, Orthopedic Surgery ? ?This document was dictated using Systems analyst. A reasonable attempt at proof reading has been made to minimize errors. ?

## 2022-02-20 ENCOUNTER — Other Ambulatory Visit (HOSPITAL_BASED_OUTPATIENT_CLINIC_OR_DEPARTMENT_OTHER): Payer: Self-pay | Admitting: Family Medicine

## 2022-02-21 ENCOUNTER — Telehealth (HOSPITAL_BASED_OUTPATIENT_CLINIC_OR_DEPARTMENT_OTHER): Payer: Self-pay | Admitting: Family Medicine

## 2022-02-21 ENCOUNTER — Other Ambulatory Visit: Payer: Self-pay

## 2022-02-21 MED ORDER — APIXABAN 5 MG PO TABS: 5.0000 mg | ORAL_TABLET | Freq: Two times a day (BID) | ORAL | 1 refills | Status: DC

## 2022-02-21 NOTE — Telephone Encounter (Signed)
Pt last saw Dr Graciela Husbands 12/05/21, last labs 11/07/21 Creat 1.36, age 81, weight 81.2kg, based on specified criteria pt is on appropriate dosage of Eliquis 5mg  BID for afib.  Will refill rx.  ?

## 2022-02-21 NOTE — Telephone Encounter (Signed)
Med rx sent to pharmacy. AS, CMA ?

## 2022-02-21 NOTE — Telephone Encounter (Signed)
Pt called on 3/28 @ 10:00am. Pt has been leaving messages on nurse line for medication refill. Pt doesn't have any more of this medication and has been receiving an "emergency supply" from pharmacy. Pt would like a refill of losartan-hydrochlorothiazide (HYZAAR) 100-12.5 MG tablet [43230] . ? ?Please send to [facility] in [place] on [address]. Should be on pt's chart. ?

## 2022-02-23 ENCOUNTER — Ambulatory Visit: Payer: PRIVATE HEALTH INSURANCE | Admitting: Orthopaedic Surgery

## 2022-03-01 ENCOUNTER — Ambulatory Visit (INDEPENDENT_AMBULATORY_CARE_PROVIDER_SITE_OTHER): Payer: Medicare Other | Admitting: Orthopaedic Surgery

## 2022-03-01 VITALS — Ht 69.0 in | Wt 179.0 lb

## 2022-03-01 DIAGNOSIS — M1612 Unilateral primary osteoarthritis, left hip: Secondary | ICD-10-CM

## 2022-03-01 DIAGNOSIS — M25552 Pain in left hip: Secondary | ICD-10-CM | POA: Diagnosis not present

## 2022-03-01 NOTE — Progress Notes (Signed)
? ?Office Visit Note ?  ?Patient: Jimmy Palmer           ?Date of Birth: May 15, 1941           ?MRN: [id number] ?Visit Date: 03/01/2022 ?             ?Requested by: Vanetta Mulders, MD ?[phone] ?[address] ?[place],  [place] [address] ?PCP: [name], [name], MD ? ? ?Assessment & Plan: ?Visit Diagnoses:  ?1. Left hip pain   ?2. Unilateral primary osteoarthritis, left hip   ? ? ?Plan: The patient does have severe end-stage arthritis of the left hip and I agree with proceeding with hip replacement surgery for him.  I went over his x-rays and a hip replacement model.  I gave him a handout about hip replacement surgery.  We talked about the risks and benefits of the surgery and what to expect from an intraoperative and postoperative course.  In order to have spinal anesthesia anesthesia would like to have him off of Eliquis for at least 3 full days prior to surgery.  He can come off of this for this with no bridging.  He does see Dr. Caryl Comes regularly.  He has had no other active medical issues or cardiac issues.  All questions and concerns were answered and addressed.  We will work on getting him scheduled for left total hip replacement. ? ?Follow-Up Instructions: Return for 2 weeks post-op.  ? ?Orders:  ?No orders of the defined types were placed in this encounter. ? ?No orders of the defined types were placed in this encounter. ? ? ? ? Procedures: ?No procedures performed ? ? ?Clinical Data: ?No additional findings. ? ? ?Subjective: ?Chief Complaint  ?Patient presents with  ? Left Hip - Pain  ?The patient is a very pleasant and active 81 year old gentleman who is actually a retired Regulatory affairs officer.  He is sent to me by one of our partners to consider hip replacement surgery on his left hip.  He has debilitating left hip pain that is gotten worse over the last 12 months to over 2 years.  He has had intra-articular steroid injections which has not helped.  He has daily left hip pain and hurts in the groin.  At this point he  can be 10 out of 10 at times.  It is detrimentally affecting his mobility, his quality of life and his actives day living.  At this point he does wish to proceed with hip replacement surgery.  He has remote history of a right partial knee replacement.  He has had epidural injections in his back before.  He has a pacemaker.  He is followed regularly by Dr. Adam Phenix from cardiology.  He is also on Eliquis for previous history of atrial fibrillation which is since resolved. ? ?HPI ? ?Review of Systems ?He currently denies any headache, chest pain, shortness of breath, fever, chills, nausea, vomiting. ? ?Objective: ?Vital Signs: Ht 5\' 9"  (1.753 m)   Wt 179 lb (81.2 kg)   BMI 26.43 kg/m?  ? ?Physical Exam ?He is alert and orient x3 and in no acute distress ?Ortho Exam ?Examination of his right hip shows it moves smoothly and fluidly.  Examination of left hip have severe pain in the groin with attempts of rotation of the left hip.  There is also stiffness with rotation of the left hip. ?Specialty Comments:  ?No specialty comments available. ? ?Imaging: ?No results found. ?X-rays on the canopy system reviewed with he  and his wife show severe end-stage arthritis of the left hip with complete bone-on-bone wear.  There is loss of the superior lateral joint space with sclerotic changes as well as cystic changes and periarticular osteophytes.  The right hip appears normal. ? ?PMFS History: ?Patient Active Problem List  ? Diagnosis Date Noted  ? Rectal bleeding 02/01/2022  ? Left hip pain 02/01/2022  ? Environmental and seasonal allergies 02/01/2022  ? Hypertension 11/07/2021  ? Chronic kidney disease 11/07/2021  ? 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 Atrial tachycardia ([place]) 08/26/2020  ? Pacemaker - MDT 08/26/2020  ? Persistent atrial fibrillation ([place]) 12/22/2019  ? ?Past Medical History:  ?Diagnosis Date  ? Abnormal electrocardiogram   ? Abnormal heart sounds   ? Allergy 2020  ? upon arrival to [facility]  ? Arthritis    ? lumbar area of back & left hip  ? Chronic kidney disease 11/07/2021  ? Depression 2020  ? receiving appropriate therapy  ? Herniated lumbar intervertebral disc   ? Hypertension   ? Left bundle branch block   ? Pacemaker   ? Pure hypercholesterolemia   ? Sick sinus syndrome ([place])   ? Sleep apnea   ? VT (ventricular tachycardia)   ?  ?Family History  ?Problem Relation Age of Onset  ? Arthritis Mother   ? Hypertension Mother   ? Hypertension Father   ?  ?Past Surgical History:  ?Procedure Laterality Date  ? CARDIOVERSION N/A 09/28/2020  ? Procedure: CARDIOVERSION;  Surgeon: Acie Fredrickson Wonda Cheng, MD;  Location: [place];  Service: Cardiovascular;  Laterality: N/A;  ? INSERT / REPLACE / REMOVE PACEMAKER    ? JOINT REPLACEMENT    ? partial right knee, medial side  ? REPLACEMENT TOTAL KNEE    ? VASECTOMY    ? ?Social History  ? ?Occupational History  ? Not on file  ?Tobacco Use  ? Smoking status: Former  ? Smokeless tobacco: Never  ?Vaping Use  ? Vaping Use: Never used  ?Substance and Sexual Activity  ? Alcohol use: Yes  ?  Alcohol/week: 24.0 standard drinks  ?  Types: 14 Glasses of wine, 10 Shots of liquor per week  ? Drug use: Never  ? Sexual activity: Not Currently  ? ? ? ? ? ? ?

## 2022-03-14 ENCOUNTER — Telehealth: Payer: Self-pay | Admitting: Orthopaedic Surgery

## 2022-03-14 NOTE — Telephone Encounter (Signed)
I called patient and scheduled surgery. 

## 2022-03-14 NOTE — Telephone Encounter (Signed)
Pt wife called and was wondering about scheduling surgery with Dr.Blackman ? ?Cb [phone]  ?

## 2022-03-24 ENCOUNTER — Other Ambulatory Visit: Payer: Self-pay | Admitting: Physician Assistant

## 2022-03-24 DIAGNOSIS — M1612 Unilateral primary osteoarthritis, left hip: Secondary | ICD-10-CM

## 2022-03-30 ENCOUNTER — Other Ambulatory Visit (HOSPITAL_BASED_OUTPATIENT_CLINIC_OR_DEPARTMENT_OTHER): Payer: Self-pay | Admitting: Family Medicine

## 2022-04-05 ENCOUNTER — Encounter: Payer: Self-pay | Admitting: Internal Medicine

## 2022-04-05 NOTE — Progress Notes (Signed)
Device orders requested. Email sent to Shari Prows with [employer]. ?

## 2022-04-05 NOTE — Progress Notes (Signed)
PERIOPERATIVE PRESCRIPTION FOR IMPLANTED CARDIAC DEVICE PROGRAMMING ? ?Patient Information: ?Name:  DAVONN FLANERY  ?DOB:  Jan 04, 1941  ?MRN:  [id number]  ? ?Planned Procedure:  Left total hip arthroplasty  ?Surgeon:  Dr. Doneen Poisson  ?Date of Procedure:  04/13/22  ?Cautery will be used.  ?Position during surgery:    ? ?Please send documentation back to:  ?[facility] (Fax # [phone])   ? ?Device Information: ? ?Clinic EP Physician:  Sherryl Manges, MD  ? ?Device Type:  Pacemaker ?Manufacturer and Phone #:  Medtr[employer]: [phone] ?Pacemaker Dependent?:  Yes.   ?Date of Last Device Check:  12/05/2021 Normal Device Function?:  Yes.   ? ?Electrophysiologist's Recommendations: ? ?Have magnet available. ?Provide continuous ECG monitoring when magnet is used or reprogramming is to be performed.  ?Procedure should not interfere with device function.  No device programming or magnet placement needed. ? ?Per Device Clinic Standing Orders, ?Wiliam Ke, RN  ?4:07 PM 04/05/2022  ?

## 2022-04-05 NOTE — Pre-Procedure Instructions (Signed)
Surgical Instructions ? ? ? Your procedure is scheduled on Thursday, May 18th. ? Report to [facility] Main Entrance "A" at 07:30 A.M., then check in with the Admitting office. ? Call this number if you have problems the morning of surgery: ? [phone] ? ? If you have any questions prior to your surgery date call [phone]: Open Monday-Friday 8am-4pm ? ? ? Remember: ? Do not eat after midnight the night before your surgery ? ?You may drink clear liquids until 06:30 AM the morning of your surgery.   ?Clear liquids allowed are: Water, Non-Citrus Juices (without pulp), Carbonated Beverages, Clear Tea, Black Coffee Only (NO MILK, CREAM OR POWDERED CREAMER of any kind), and Gatorade. ? ? ?Patient Instructions ? ?The night before surgery:  ?No food after midnight. ONLY clear liquids after midnight ? ?The day of surgery (if you do NOT have diabetes):  ?Drink ONE (1) Pre-Surgery Clear Ensure by 06:30 AM the morning of surgery. Drink in one sitting. Do not sip.  ?This drink was given to you during your hospital  ?pre-op appointment visit. ? ?Nothing else to drink after completing the  ?Pre-Surgery Clear Ensure. ? ? ?       If you have questions, please contact your surgeon?s office.  ? ?  ? Take these medicines the morning of surgery with A SIP OF WATER  ?buPROPion [facility] SR) ?doxazosin (CARDURA) ?sertraline (ZOLOFT) ? ? ? ?If needed: ?fexofenadine (ALLEGRA)  ?triamcinolone (NASACORT) ? ?Follow your surgeon's instructions on when to stop Eliquis.  If no instructions were given by your surgeon then you will need to call the office to get those instructions.    ? ?As of today, STOP taking any Aspirin (unless otherwise instructed by your surgeon) Aleve, Naproxen, Ibuprofen, Motrin, Advil, Goody's, BC's, all herbal medications, fish oil, and all vitamins. This includes your diclofenac Sodium (VOLTAREN) 1 % GEL. ?         ?           ?Do NOT Smoke (Tobacco/Vaping) for 24 hours prior to your procedure. ? ?If you use a  CPAP at night, you may bring your mask/headgear for your overnight stay. ?  ?Contacts, glasses, piercing's, hearing aid's, dentures or partials may not be worn into surgery, please bring cases for these belongings.  ?  ?For patients admitted to the hospital, discharge time will be determined by your treatment team. ?  ?Patients discharged the day of surgery will not be allowed to drive home, and someone needs to stay with them for 24 hours. ? ?SURGICAL WAITING ROOM VISITATION ?Patients having surgery or a procedure may have two support people in the waiting room. These visitors may be switched out with other visitors if needed. ?Children under the age of 89 must have an adult accompany them who is not the patient. ?If the patient needs to stay at the hospital during part of their recovery, the visitor guidelines for inpatient rooms apply. ? ?Please refer to the [facility] website for the visitor guidelines for Inpatients (after your surgery is over and you are in a regular room).  ? ? ?Special instructions:   ?[facility]- Preparing For Surgery ? ?Before surgery, you can play an important role. Because skin is not sterile, your skin needs to be as free of germs as possible. You can reduce the number of germs on your skin by washing with CHG (chlorahexidine gluconate) Soap before surgery.  CHG is an antiseptic cleaner which kills germs and bonds with the skin  to continue killing germs even after washing.   ? ?Oral Hygiene is also important to reduce your risk of infection.  Remember - BRUSH YOUR TEETH THE MORNING OF SURGERY WITH YOUR REGULAR TOOTHPASTE ? ?Please do not use if you have an allergy to CHG or antibacterial soaps. If your skin becomes reddened/irritated stop using the CHG.  ?Do not shave (including legs and underarms) for at least 48 hours prior to first CHG shower. It is OK to shave your face. ? ?Please follow these instructions carefully. ?  ?Shower the NIGHT BEFORE SURGERY and the MORNING OF  SURGERY ? ?If you chose to wash your hair, wash your hair first as usual with your normal shampoo. ? ?After you shampoo, rinse your hair and body thoroughly to remove the shampoo. ? ?Use CHG Soap as you would any other liquid soap. You can apply CHG directly to the skin and wash gently with a scrungie or a clean washcloth.  ? ?Apply the CHG Soap to your body ONLY FROM THE NECK DOWN.  Do not use on open wounds or open sores. Avoid contact with your eyes, ears, mouth and genitals (private parts). Wash Face and genitals (private parts)  with your normal soap.  ? ?Wash thoroughly, paying special attention to the area where your surgery will be performed. ? ?Thoroughly rinse your body with warm water from the neck down. ? ?DO NOT shower/wash with your normal soap after using and rinsing off the CHG Soap. ? ?Pat yourself dry with a CLEAN TOWEL. ? ?Wear CLEAN PAJAMAS to bed the night before surgery ? ?Place CLEAN SHEETS on your bed the night before your surgery ? ?DO NOT SLEEP WITH PETS. ? ? ?Day of Surgery: ?Take a shower with CHG soap. ?Do not wear jewelry  ?Do not wear lotions, powders, colognes, or deodorant. ?Do not shave 48 hours prior to surgery.  Men may shave face and neck. ?Do not bring valuables to the hospital.  ?[facility] is not responsible for any belongings or valuables. ?Do not wear nail polish, gel polish, artificial nails, or any other type of covering on natural nails (fingers and toes) ?If you have artificial nails or gel coating that need to be removed by a nail salon, please have this removed prior to surgery. Artificial nails or gel coating may interfere with anesthesia's ability to adequately monitor your vital signs. ?Wear Clean/Comfortable clothing the morning of surgery ?Remember to brush your teeth WITH YOUR REGULAR TOOTHPASTE. ?  ?Please read over the following fact sheets that you were given. ? ? ? ?If you received a COVID test during your pre-op visit  it is requested that you wear a mask  when out in public, stay away from anyone that may not be feeling well and notify your surgeon if you develop symptoms. If you have been in contact with anyone that has tested positive in the last 10 days please notify you surgeon.  ?

## 2022-04-06 ENCOUNTER — Encounter (HOSPITAL_COMMUNITY): Payer: Self-pay

## 2022-04-06 ENCOUNTER — Other Ambulatory Visit: Payer: Self-pay

## 2022-04-06 ENCOUNTER — Encounter (HOSPITAL_COMMUNITY)
Admission: RE | Admit: 2022-04-06 | Discharge: 2022-04-06 | Disposition: A | Discharge status: Home / Self Care | Payer: Medicare Other | Source: Ambulatory Visit | Attending: Orthopaedic Surgery | Admitting: Orthopaedic Surgery

## 2022-04-06 VITALS — BP 130/59 | HR 85 | Temp 98.0°F | Resp 17 | Ht 69.0 in | Wt 176.4 lb

## 2022-04-06 DIAGNOSIS — M1612 Unilateral primary osteoarthritis, left hip: Secondary | ICD-10-CM | POA: Diagnosis not present

## 2022-04-06 DIAGNOSIS — Z01818 Encounter for other preprocedural examination: Secondary | ICD-10-CM | POA: Insufficient documentation

## 2022-04-06 DIAGNOSIS — Z789 Other specified health status: Secondary | ICD-10-CM | POA: Insufficient documentation

## 2022-04-06 HISTORY — DX: Actinic keratosis: L57.0

## 2022-04-06 LAB — COMPREHENSIVE METABOLIC PANEL
ALT: 22 U/L (ref 0–44)
AST: 21 U/L (ref 15–41)
Albumin: 3.9 g/dL (ref 3.5–5.0)
Alkaline Phosphatase: 83 U/L (ref 38–126)
Anion gap: 8 (ref 5–15)
BUN: 24 mg/dL — ABNORMAL HIGH (ref 8–23)
CO2: 24 mmol/L (ref 22–32)
Calcium: 9.1 mg/dL (ref 8.9–10.3)
Chloride: 101 mmol/L (ref 98–111)
Creatinine, Ser: 1.44 mg/dL — ABNORMAL HIGH (ref 0.61–1.24)
GFR, Estimated: 49 mL/min — ABNORMAL LOW (ref >60)
Glucose, Bld: 105 mg/dL — ABNORMAL HIGH (ref 70–99)
Potassium: 3.8 mmol/L (ref 3.5–5.1)
Sodium: 133 mmol/L — ABNORMAL LOW (ref 135–145)
Total Bilirubin: 0.7 mg/dL (ref 0.3–1.2)
Total Protein: 7.3 g/dL (ref 6.5–8.1)

## 2022-04-06 LAB — TYPE AND SCREEN
ABO/RH(D): O NEG
Antibody Screen: NEGATIVE

## 2022-04-06 LAB — CBC
HCT: 39.7 % (ref 39.0–52.0)
Hemoglobin: 13.4 g/dL (ref 13.0–17.0)
MCH: 31.2 pg (ref 26.0–34.0)
MCHC: 33.8 g/dL (ref 30.0–36.0)
MCV: 92.5 fL (ref 80.0–100.0)
Platelets: 232 10*3/uL (ref 150–400)
RBC: 4.29 MIL/uL (ref 4.22–5.81)
RDW: 11.9 % (ref 11.5–15.5)
WBC: 10.6 10*3/uL — ABNORMAL HIGH (ref 4.0–10.5)
nRBC: 0 % (ref 0.0–0.2)

## 2022-04-06 LAB — SURGICAL PCR SCREEN
MRSA, PCR: NEGATIVE
Staphylococcus aureus: POSITIVE — AB

## 2022-04-06 NOTE — Progress Notes (Signed)
PCP - Dr. Marcy Salvo de [place] ?Cardiologist - Dr. Hurman Horn ? ?PPM - [employer] pacemaker ?Device Orders - in chart, have magnet available ?Rep Notified - E-mail sent to Luisa Dago prior to device orders ? ?Chest x-ray - 01/04/2004 ?EKG - 04/06/22 at PAT ?Stress Test - 20 + years ago per pt, no abnormalities ?ECHO - 07/07/13 ?Cardiac Cath - denies ? ?Sleep Study - 15-20 years ago, OSA+ ?CPAP - denies ? ?DM- denies ? ?Blood Thinner Instructions: Hold Eliquis starting 5/14 per order ?Aspirin Instructions: n/a ? ?ERAS Protcol - yes ?PRE-SURGERY Ensure given at PAT ? ?COVID TEST- n/a ? ? ?Anesthesia review: yes, cardiac hx, pacemaker ? ?Patient denies shortness of breath, fever, cough and chest pain at PAT appointment ? ? ?All instructions explained to the patient, with a verbal understanding of the material. Patient agrees to go over the instructions while at home for a better understanding. Patient also instructed to notify surgeon of any contact with COVID+ person or if he develops any symptoms. The opportunity to ask questions was provided. ?  ?

## 2022-04-07 ENCOUNTER — Encounter (HOSPITAL_COMMUNITY): Payer: Self-pay | Admitting: Vascular Surgery

## 2022-04-07 ENCOUNTER — Telehealth: Payer: Self-pay | Admitting: Orthopaedic Surgery

## 2022-04-07 ENCOUNTER — Telehealth: Payer: Self-pay

## 2022-04-07 NOTE — Telephone Encounter (Signed)
Pt called requesting a call back from [facility]. Or Jimmy Palmer.concerning info needed to be given to give to Dr. Magnus Ivan directly. Pt did not disclosed what he needed exactly. Pleae call pt at [phone]. ?

## 2022-04-07 NOTE — Telephone Encounter (Signed)
Patient called states that he went to the dentist and his crown broke ?They told him that he would need partial and there was decay in root ?He was wondering if this is something he has to take care of before surgery? Going to oral surgeon Monday ?Can he still have surgery on the 18th? ?

## 2022-04-07 NOTE — Telephone Encounter (Signed)
I called patient and left voice mail advising what Dr. Magnus Ivan said.  I told patient I would call him back to reschedule surgery. ?

## 2022-04-07 NOTE — Progress Notes (Signed)
Anesthesia Chart Review: ? Case: [id number] Date: 04/13/22  ? Procedure: LEFT TOTAL HIP ARTHROPLASTY ANTERIOR APPROACH (Left: Hip)  ? Anesthesia type: Spinal  ? Pre-op diagnosis: osteoarthritis left hip  ? Location: [facility] OR  ? Surgeons: Mcarthur Rossetti, MD  ? ?  ? ? ?DISCUSSION: Patient is a 81 year old male scheduled for the above procedure on 04/13/2022; however surgery to be delayed at least 2-3 weeks following dental visit for a broken crown with evidence of "decay in root".  ? 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 ?Last visit with EP cardiologist Dr. Caryl Comes was on 12/05/2021.  No interval A-fib.  Continue Eliquis.  Euvolemic on exam.  Continue HCTZ.  BP well controlled on losartan.  Estimated remaining battery longevity: "24 months, < 4 - 43 months".  He will continue remote monitoring.  28-month follow-up planned.  Per 03/01/22 note, by Dr. Ninfa Linden, he should hold Eliquis for at least 3 full days prior to surgery. ? ?PPM Perioperative Recommendations: ?Device Information: ?Clinic EP Physician:  Virl Axe, MD  ?Device Type:  Pacemaker ?Manufacturer and Phone #:  Medtronic: [phone] ?Pacemaker Dependent?:  Yes.   ?Date of Last Device Check:  12/05/2021         Normal Device Function?:  Yes.   ?  ?Electrophysiologist's Recommendations: ?Have magnet available. ?Provide continuous ECG monitoring when magnet is used or reprogramming is to be performed.  ?Procedure should not interfere with device function.  No device programming or magnet placement needed. ? ? ?VS: BP (!) 130/59   Pulse 85   Temp 36.7 ?C (Oral)   Resp 17   Ht 5\' 9"  (1.753 m)   Wt 80 kg   SpO2 99%   BMI 26.05 kg/m?  ? ? ?PROVIDERS: ?[name], Blondell Reveal, MD is PCP ([place]) ?Virl Axe, MD is EP cardiologist ? ? ?LABS: Labs reviewed: Acceptable for surgery.  Creatinine 1.44 is consistent with previous results. ?(all labs ordered are listed, but only abnormal results are displayed) ? ?Labs Reviewed  ?SURGICAL PCR SCREEN - Abnormal; Notable for the following components:  ?    Result Value  ? Staphylococcus aureus POSITIVE (*)   ? All other components within normal limits  ?COMPREHENSIVE METABOLIC PANEL - Abnormal; Notable for the following components:  ? Sodium 133 (*)   ? Glucose, Bld 105 (*)   ? BUN 24 (*)   ? Creatinine, Ser 1.44 (*)   ? GFR, Estimated 49 (*)   ? All other components within normal limits  ?CBC - Abnormal; Notable for the following components:  ? WBC 10.6 (*)   ? All other components within normal limits  ?TYPE AND SCREEN  ? ? ?EKG: 04/06/22: ?Atrial-sensed ventricular-paced rhythm ?Abnormal ECG ?No previous ECGs available ?Confirmed by Sherren Mocha [phone]) on 04/06/2022 10:32:13 PM ? ? ?CV: ?Nuclear stress test 03/18/15 ([place]): ?Impressions: ?1.  Normal myocardial perfusion. ?2.  Normal left ventricular systolic function. ? ?Echo 07/07/13 ([place]): ?Conclusion: ?Normal LV size and overall function. ?Mild LVH. ?Enlarged LA. ?Grade 1 diastolic dysfunction. ?Aortic sclerosis.  No hemodynamically significant aortic valve stenosis.  No significant aortic valve regurgitation. ?Mild pulmonary hypertension.  RVSP 42 mmHg. ?Mild to moderate tricuspid regurgitation. ? ? ?Past Medical History:  ?Diagnosis Date  ?  Abnormal electrocardiogram   ? Abnormal heart sounds   ? Allergy 2020  ? upon arrival to [facility]  ? Arthritis   ? lumbar area of back & left hip  ? Chronic kidney disease 11/07/2021  ? Depression 2020  ? receiving appropriate therapy  ? Herniated lumbar intervertebral disc   ? Hypertension   ? Keratosis   ? benign  ? Left bundle branch block   ? Pacemaker   ? Pure hypercholesterolemia   ? Sick sinus syndrome ([place])   ? Sleep apnea   ? VT  (ventricular tachycardia) ([place])   ? ? ?Past Surgical History:  ?Procedure Laterality Date  ? CARDIOVERSION N/A 09/28/2020  ? Procedure: CARDIOVERSION;  Surgeon: Acie Fredrickson Wonda Cheng, MD;  Location: [facility];  Service: Cardiovascular;  Laterality: N/A;  ? INSERT / REPLACE / REMOVE PACEMAKER    ? JOINT REPLACEMENT    ? partial right knee, medial side  ? VASECTOMY    ? ? ?MEDICATIONS: ? apixaban (ELIQUIS) 5 MG TABS tablet  ? buPROPion (WELLBUTRIN SR) 150 MG 12 hr tablet  ? diclofenac Sodium (VOLTAREN) 1 % GEL  ? diphenhydrAMINE (BENADRYL) 25 mg capsule  ? doxazosin (CARDURA) 4 MG tablet  ? fexofenadine (ALLEGRA) 180 MG tablet  ? losartan-hydrochlorothiazide (HYZAAR) 100-12.5 MG tablet  ? Melatonin 5 MG TABS  ? Multiple Vitamin (MULTIVITAMIN PO)  ? phenylephrine (,USE FOR PREPARATION-H,) 0.25 % suppository  ? sertraline (ZOLOFT) 25 MG tablet  ? simvastatin (ZOCOR) 40 MG tablet  ? triamcinolone (NASACORT) 55 MCG/ACT AERO nasal inhaler  ? ?No current facility-administered medications for this encounter.  ? ? ?Myra Gianotti, PA-C ?Surgical Short Stay/Anesthesiology ?[facility] Phone [phone] ?[facility] Phone [phone] ?04/07/2022 7:13 PM ? ? ? ? ? ? ?

## 2022-04-13 ENCOUNTER — Ambulatory Visit (HOSPITAL_COMMUNITY)
Admission: RE | Admit: 2022-04-13 | Discharge: 2022-04-13 | Disposition: A | Discharge status: Home / Self Care | Payer: Medicare Other | Attending: Orthopaedic Surgery | Admitting: Orthopaedic Surgery

## 2022-04-13 NOTE — Progress Notes (Signed)
Patient arrived to [facility] for surgery.  Per previous documentation, procedure had been canceled and patient had been called by office.  Patient states that he never received a call.  Patient and wife were upset that they had not received a call and given the number to Office of Patient Experience.     Notified Waldo Laine at Dr. Eliberto Ivory office of situation and requested office to reach out to patient.

## 2022-04-13 NOTE — H&P (Signed)
Surgery was canceled.  No H&P necessary

## 2022-04-13 NOTE — Telephone Encounter (Signed)
Patient called me back today.  He stated that he went to the [employer] store to have his phone checked.  There was an issue with notifications of voice mails and for some reason my voice mail was hidden but they somehow uncovered.  Patient wanted me to know that he believed that I did leave the message and he needed the cell company to see what was going on with his phone.  I apologized again for what he went through but glad that he was able to get that part resolved.  We agreed I would reach back out to him about rescheduling his surgery once I was able to pin down a date.  Dr. Ninfa Linden - Per my conversation with patient today, he did have oral surgery on Monday, 04/10/22.  The surgeon was able to confirm removal of tooth, roots and all decay.  Patient was given Amoxicillin 500 mg which he took for 2-3 days.  The medication caused him diarrhea.  He notified the oral surgeon.  The surgeon did not feel it necessary to prescribe another antibiotic.  The surgeon has told him to rinse with warm salt water several times a day.  Is it still okay to reschedule for 2 to 3 weeks?  Thanks!

## 2022-04-13 NOTE — Telephone Encounter (Signed)
Chat message from Lindsi RN at [facility] Short Stay, Good morning- Mr. Worth arrived for surgery this morning that was canceled due to dental reasons.  He states that he never received a phone call to inform him that his procedure was canceled.  I called patient this morning.  We discussed how upset he and his wife were about the situation.  He was bothered that he had spoken with Morrie Sheldon on Friday and did not hear back from her.  I reiterated that I did call him Friday and left a detailed voice mail as I felt safe to do so since I knew his voice.  He and I had talked on the phone several times before.  I was not sure why the call didn't go through nor did he receive the voice mail.  We have had complaints that our phone calls get redirected as spam by some cell phone carriers.  I did apologize and acknowledge his frustration.  I read to him what Dr. Eliberto Ivory response was to his dental issue and hip surgery.  I told him my goal was to reschedule him sooner than later when I could get a better handle on when I could work him back into Dr. Eliberto Ivory schedule.  We agreed that I would call him back to reschedule.

## 2022-04-18 NOTE — Telephone Encounter (Signed)
Spoke with patient today and rescheduled surgery to 05/05/22 at [facility].

## 2022-04-19 NOTE — Progress Notes (Addendum)
Anesthesia Review:  PCP: DR Debbra Riding  LOV 02/01/2022.   Cardiologist : DR Virl Axe  LOV 12/05/2021.  Chest x-ray : EKG :04/06/22  Last Device Check- 12/08/2021  No clearances for surgery per Almedia Balls on 04/26/22.  Periop device orders are under Documentation dated 04/05/2022.  Device check- 12/08/21  Echo : Stress test: Cardiac Cath :  Activity level: can do a flight of stairs wtihout difficulty  Sleep Study/ CPAP : has sleep apnea no cpap  Fasting Blood Sugar :      / Checks Blood Sugar -- times a day:   Blood Thinner/ Instructions /Last Dose: ASA / Instructions/ Last Dose :   Eliquis - stop 5 days prior per pt Last dose 05/01/2022  Surgery cancelled 03/2022 due to dental issue. Per note but pt states he showed up for [facility] for surgery and waited 1.5 hours and then was told surgery was cancelled.      PT is a retired Regulatory affairs officer.   Wife is a Marine scientist.   Labs of cbc and cmp done 04/06/22- in epic

## 2022-04-19 NOTE — Progress Notes (Signed)
DUE TO COVID-19 ONLY  2  VISITOR IS ALLOWED TO COME WITH YOU AND STAY IN THE WAITING ROOM ONLY DURING PRE OP AND PROCEDURE DAY OF SURGERY.   4 VISITOR  MAY VISIT WITH YOU AFTER SURGERY IN YOUR PRIVATE ROOM DURING VISITING HOURS ONLY! YOU MAY HAVE ONE PERSON SPEND THE NITE WITH YOU IN YOUR ROOM AFTER SURGERY.      Your procedure is scheduled on:        05/05/2022   Report to [facility] Main  Entrance   Report to admitting at         0530         AM DO NOT BRING INSURANCE CARD, PICTURE ID OR WALLET DAY OF SURGERY.      Call this number if you have problems the morning of surgery [phone]    REMEMBER: NO  SOLID FOODS , CANDY, GUM OR MINTS AFTER MIDNITE THE NITE BEFORE SURGERY .       Marland Kitchen CLEAR LIQUIDS UNTIL        0515am          DAY OF am SURGERY.      PLEASE FINISH ENSURE DRINK PER SURGEON ORDER  WHICH NEEDS TO BE COMPLETED AT  0515am        MORNING OF SURGERY.       CLEAR LIQUID DIET   Foods Allowed      WATER BLACK COFFEE ( SUGAR OK, NO MILK, CREAM OR CREAMER) REGULAR AND DECAF  TEA ( SUGAR OK NO MILK, CREAM, OR CREAMER) REGULAR AND DECAF  PLAIN JELLO ( NO RED)  FRUIT ICES ( NO RED, NO FRUIT PULP)  POPSICLES ( NO RED)  JUICE- APPLE, WHITE GRAPE AND WHITE CRANBERRY  SPORT DRINK LIKE GATORADE ( NO am RED)  CLEAR BROTH ( VEGETABLE , CHICKEN OR BEEF)                                                                     BRUSH YOUR TEETH MORNING OF SURGERY AND RINSE YOUR MOUTH OUT, NO CHEWING GUM CANDY OR MINTS.     Take these medicines the morning of surgery with A SIP OF WATER:  wellbutrin, cardura, zoloft    DO NOT TAKE ANY DIABETIC MEDICATIONS DAY OF YOUR SURGERY                               You may not have any metal on your body including hair pins and              piercings  Do not wear jewelry, make-up, lotions, powders or perfumes, deodorant             Do not wear nail polish on your fingernails.              IF YOU ARE A MALE AND WANT TO SHAVE UNDER ARMS  OR LEGS PRIOR TO SURGERY YOU MUST DO SO AT LEAST 48 HOURS PRIOR TO SURGERY.              Men may shave face and neck.   Do not bring valuables to the hospital. [facility] IS NOT  RESPONSIBLE   FOR VALUABLES.  Contacts, dentures or bridgework may not be worn into surgery.  Leave suitcase in the car. After surgery it may be brought to your room.     Patients discharged the day of surgery will not be allowed to drive home. IF YOU ARE HAVING SURGERY AND GOING HOME THE SAME DAY, YOU MUST HAVE AN ADULT TO DRIVE YOU HOME AND BE WITH YOU FOR 24 HOURS. YOU MAY GO HOME BY TAXI OR UBER OR ORTHERWISE, BUT AN ADULT MUST ACCOMPANY YOU HOME AND STAY WITH YOU FOR 24 HOURS.                Please read over the following fact sheets you were given: _____________________________________________________________________  [facility] - Preparing for Surgery Before surgery, you can play an important role.  Because skin is not sterile, your skin needs to be as free of germs as possible.  You can reduce the number of germs on your skin by washing with CHG (chlorahexidine gluconate) soap before surgery.  CHG is an antiseptic cleaner which kills germs and bonds with the skin to continue killing germs even after washing. Please DO NOT use if you have an allergy to CHG or antibacterial soaps.  If your skin becomes reddened/irritated stop using the CHG and inform your nurse when you arrive at Short Stay. Do not shave (including legs and underarms) for at least 48 hours prior to the first CHG shower.  You may shave your face/neck. Please follow these instructions carefully:  1.  Shower with CHG Soap the night before surgery and the  morning of Surgery.  2.  If you choose to wash your hair, wash your hair first as usual with your  normal  shampoo.  3.  After you shampoo, rinse your hair and body thoroughly to remove the  shampoo.                           4.  Use CHG as you would any other liquid soap.  You can  apply chg directly  to the skin and wash                       Gently with a scrungie or clean washcloth.  5.  Apply the CHG Soap to your body ONLY FROM THE NECK DOWN.   Do not use on face/ open                           Wound or open sores. Avoid contact with eyes, ears mouth and genitals (private parts).                       Wash face,  Genitals (private parts) with your normal soap.             6.  Wash thoroughly, paying special attention to the area where your surgery  will be performed.  7.  Thoroughly rinse your body with warm water from the neck down.  8.  DO NOT shower/wash with your normal soap after using and rinsing off  the CHG Soap.                9.  Pat yourself dry with a clean towel.            10.  Wear clean pajamas.  11.  Place clean sheets on your bed the night of your first shower and do not  sleep with pets. Day of Surgery : Do not apply any lotions/deodorants the morning of surgery.  Please wear clean clothes to the hospital/surgery center.  FAILURE TO FOLLOW THESE INSTRUCTIONS MAY RESULT IN THE CANCELLATION OF YOUR SURGERY PATIENT SIGNATURE_________________________________  NURSE SIGNATURE__________________________________  ________________________________________________________________________

## 2022-04-26 ENCOUNTER — Other Ambulatory Visit: Payer: Self-pay

## 2022-04-26 ENCOUNTER — Ambulatory Visit: Admit: 2022-04-26 | Payer: Medicare Other | Admitting: Orthopaedic Surgery

## 2022-04-26 ENCOUNTER — Encounter (HOSPITAL_COMMUNITY)
Admission: RE | Admit: 2022-04-26 | Discharge: 2022-04-26 | Disposition: A | Discharge status: Home / Self Care | Payer: Medicare Other | Source: Ambulatory Visit | Attending: Orthopaedic Surgery | Admitting: Orthopaedic Surgery

## 2022-04-26 ENCOUNTER — Encounter (HOSPITAL_COMMUNITY): Payer: Self-pay

## 2022-04-26 ENCOUNTER — Other Ambulatory Visit: Payer: Self-pay | Admitting: Physician Assistant

## 2022-04-26 VITALS — BP 140/76 | HR 84 | Temp 98.6°F | Resp 16 | Ht 69.0 in | Wt 176.4 lb

## 2022-04-26 DIAGNOSIS — Z01818 Encounter for other preprocedural examination: Secondary | ICD-10-CM

## 2022-04-26 DIAGNOSIS — M1612 Unilateral primary osteoarthritis, left hip: Secondary | ICD-10-CM

## 2022-04-26 DIAGNOSIS — Z01812 Encounter for preprocedural laboratory examination: Secondary | ICD-10-CM | POA: Diagnosis present

## 2022-04-26 LAB — SURGICAL PCR SCREEN
MRSA, PCR: NEGATIVE
Staphylococcus aureus: POSITIVE — AB

## 2022-04-26 SURGERY — ARTHROPLASTY, HIP, TOTAL, ANTERIOR APPROACH
Anesthesia: Spinal | Site: Hip | Laterality: Left

## 2022-04-27 ENCOUNTER — Encounter: Payer: PRIVATE HEALTH INSURANCE | Admitting: Orthopaedic Surgery

## 2022-04-28 ENCOUNTER — Other Ambulatory Visit: Payer: Self-pay

## 2022-05-04 DIAGNOSIS — M1612 Unilateral primary osteoarthritis, left hip: Secondary | ICD-10-CM

## 2022-05-04 NOTE — H&P (Signed)
TOTAL HIP ADMISSION H&P  Patient is admitted for left total hip arthroplasty.  Subjective:  Chief Complaint: left hip pain  HPI: Jimmy Palmer, 81 y.o. male, has a history of pain and functional disability in the left hip(s) due to arthritis and patient has failed non-surgical conservative treatments for greater than 12 weeks to include NSAID's and/or analgesics, corticosteriod injections, use of assistive devices, and activity modification.  Onset of symptoms was gradual starting 2 years ago with gradually worsening course since that time.The patient noted no past surgery on the left hip(s).  Patient currently rates pain in the left hip at 10 out of 10 with activity. Patient has night pain, worsening of pain with activity and weight bearing, trendelenberg gait, pain that interfers with activities of daily living, and pain with passive range of motion. Patient has evidence of subchondral sclerosis, periarticular osteophytes, and joint space narrowing by imaging studies. This condition presents safety issues increasing the risk of falls.  There is no current active infection.  Patient Active Problem List   Diagnosis Date Noted   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   Rectal bleeding 02/01/2022   Left hip pain 02/01/2022   Environmental and seasonal allergies 02/01/2022   Hypertension 11/07/2021   Chronic kidney disease 11/07/2021   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   Atrial tachycardia (HCC) 08/26/2020   Pacemaker - MDT 08/26/2020   Persistent atrial fibrillation (HCC) 12/22/2019   Past Medical History:  Diagnosis Date   Abnormal electrocardiogram    Abnormal heart sounds    Allergy 2020   upon arrival to [facility]   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   Chronic kidney disease 11/07/2021   Depression 2020   receiving appropriate therapy   Herniated lumbar intervertebral disc    Hypertension    Keratosis    benign   Left bundle branch block    Pacemaker    Pure  hypercholesterolemia    Sick sinus syndrome (HCC)    Sleep apnea    VT (ventricular tachycardia) (HCC)     Past Surgical History:  Procedure Laterality Date   CARDIOVERSION N/A 09/28/2020   Procedure: CARDIOVERSION;  Surgeon: Nahser, Deloris Ping, MD;  Location: [facility] ENDOSCOPY;  Service: Cardiovascular;  Laterality: N/A;   INSERT / REPLACE / REMOVE PACEMAKER     JOINT REPLACEMENT     partial right knee, medial side   VASECTOMY      No current facility-administered medications for this encounter.   Current Outpatient Medications  Medication Sig Dispense Refill Last Dose   apixaban (ELIQUIS) 5 MG TABS tablet Take 1 tablet (5 mg total) by mouth 2 (two) times daily. 180 tablet 1    buPROPion (WELLBUTRIN SR) 150 MG 12 hr tablet Take 150 mg by mouth daily.      diclofenac Sodium (VOLTAREN) 1 % GEL Apply 1 application topically as needed (pain).       diphenhydrAMINE (BENADRYL) 25 mg capsule Take 25 mg by mouth at bedtime.       doxazosin (CARDURA) 4 MG tablet TAKE 1 TABLET BY MOUTH EVERY DAY 90 tablet 0    losartan-hydrochlorothiazide (HYZAAR) 100-12.5 MG tablet TAKE 1 TABLET BY MOUTH EVERY DAY 90 tablet 1    Melatonin 5 MG TABS Take 5 mg by mouth at bedtime.       Multiple Vitamin (MULTIVITAMIN PO) Take 2 tablets by mouth once a week.      phenylephrine (,USE FOR PREPARATION-H,) 0.25 % suppository  Place 1 suppository rectally 2 (two) times daily as needed for hemorrhoids. 12 suppository 1    sertraline (ZOLOFT) 25 MG tablet Take 25 mg by mouth daily.      simvastatin (ZOCOR) 40 MG tablet Take 1 tablet (40 mg total) by mouth daily. 90 tablet 3    triamcinolone (NASACORT) 55 MCG/ACT AERO nasal inhaler Place 2 sprays into the nose daily. (Patient taking differently: Place 2 sprays into the nose daily as needed (allergies).) 1 each 2    fexofenadine (ALLEGRA) 180 MG tablet Take 1 tablet (180 mg total) by mouth daily. (Patient not taking: Reported on 04/20/2022) 90 tablet 1 Not Taking   Allergies   Allergen Reactions   Amoxicillin Diarrhea    Social History   Tobacco Use   Smoking status: Former    Types: Cigars   Smokeless tobacco: Never   Tobacco comments:    Smoked cigars until the early 2000's  Substance Use Topics   Alcohol use: Yes    Alcohol/week: 14.0 standard drinks of alcohol    Types: 7 Glasses of wine, 7 Shots of liquor per week    Comment: 1-2 cocktails every day and 1 glass of wine every day    Family History  Problem Relation Age of Onset   Arthritis Mother    Hypertension Mother    Hypertension Father      Review of Systems  Musculoskeletal:  Positive for gait problem.  All other systems reviewed and are negative.   Objective:  Physical Exam Vitals reviewed.  Constitutional:      Appearance: Normal appearance.  HENT:     Head: Normocephalic and atraumatic.  Eyes:     Extraocular Movements: Extraocular movements intact.     Pupils: Pupils are equal, round, and reactive to light.  Cardiovascular:     Rate and Rhythm: Normal rate.  Pulmonary:     Effort: Pulmonary effort is normal.     Breath sounds: Normal breath sounds.  Abdominal:     Palpations: Abdomen is soft.  Musculoskeletal:     Cervical back: Normal range of motion and neck supple.     Left hip: Tenderness and bony tenderness present. Decreased range of motion. Decreased strength.  Neurological:     Mental Status: He is alert and oriented to person, place, and time.  Psychiatric:        Behavior: Behavior normal.     Vital signs in last 24 hours:    Labs:   Estimated body mass index is 26.05 kg/m as calculated from the following:   Height as of 04/26/22: 5\' 9"  (1.753 m).   Weight as of 04/26/22: 80 kg.   Imaging Review Plain radiographs demonstrate severe degenerative joint disease of the left hip(s). The bone quality appears to be good for age and reported activity level.      Assessment/Plan:  End stage arthritis, left hip(s)  The patient history, physical  examination, clinical judgement of the provider and imaging studies are consistent with end stage degenerative joint disease of the left hip(s) and total hip arthroplasty is deemed medically necessary. The treatment options including medical management, injection therapy, arthroscopy and arthroplasty were discussed at length. The risks and benefits of total hip arthroplasty were presented and reviewed. The risks due to aseptic loosening, infection, stiffness, dislocation/subluxation,  thromboembolic complications and other imponderables were discussed.  The patient acknowledged the explanation, agreed to proceed with the plan and consent was signed. Patient is being admitted for inpatient treatment for surgery,  pain control, PT, OT, prophylactic antibiotics, VTE prophylaxis, progressive ambulation and ADL's and discharge planning.The patient is planning to be discharged home with home health services

## 2022-05-04 NOTE — Anesthesia Preprocedure Evaluation (Addendum)
Anesthesia Evaluation  Patient identified by MRN, date of birth, ID band Patient awake    Reviewed: Allergy & Precautions, NPO status , Patient's Chart, lab work & pertinent test results  Airway Mallampati: II  TM Distance: >3 FB Neck ROM: Full    Dental no notable dental hx. (+) Teeth Intact, Dental Advisory Given   Pulmonary sleep apnea , former smoker,    Pulmonary exam normal breath sounds clear to auscultation       Cardiovascular hypertension, Normal cardiovascular exam+ dysrhythmias Atrial Fibrillation + pacemaker (For 3 degree Heart Block)  Rhythm:Regular Rate:Normal  Pacemaker dependent   Neuro/Psych    GI/Hepatic negative GI ROS, Neg liver ROS,   Endo/Other    Renal/GU Lab Results      Component                Value               Date                      CREATININE               1.44 (H)            04/06/2022            K                        3.8                 04/06/2022                     Musculoskeletal  (+) Arthritis ,   Abdominal   Peds  Hematology Lab Results      Component                Value               Date                      HGB                      13.4                04/06/2022                HCT                      39.7                04/06/2022               PLT                      232                 04/06/2022              Anesthesia Other Findings ALL : Amoxicillin  Reproductive/Obstetrics                            Anesthesia Physical Anesthesia Plan  ASA: 3  Anesthesia Plan: Spinal   Post-op Pain Management: Regional block*   Induction:   PONV Risk Score and Plan: 2 and Treatment may vary due to age or medical condition  Airway Management Planned: Natural Airway and  Nasal Cannula  Additional Equipment: None  Intra-op Plan:   Post-operative Plan:   Informed Consent:     Dental advisory given  Plan Discussed with:   Anesthesia  Plan Comments: (Pt onEliquis Check Last dose)        Anesthesia Quick Evaluation

## 2022-05-05 ENCOUNTER — Other Ambulatory Visit: Payer: Self-pay

## 2022-05-05 ENCOUNTER — Encounter (HOSPITAL_COMMUNITY): Payer: Self-pay | Admitting: Orthopaedic Surgery

## 2022-05-05 ENCOUNTER — Observation Stay (HOSPITAL_COMMUNITY)
Admission: RE | Admit: 2022-05-05 | Discharge: 2022-05-06 | Disposition: A | Discharge status: Home Health | Payer: Medicare Other | Source: Ambulatory Visit | Attending: Orthopaedic Surgery | Admitting: Orthopaedic Surgery

## 2022-05-05 ENCOUNTER — Ambulatory Visit (HOSPITAL_COMMUNITY): Payer: Medicare Other | Admitting: Physician Assistant

## 2022-05-05 ENCOUNTER — Encounter (HOSPITAL_COMMUNITY)
Admission: RE | Disposition: A | Discharge status: Home Health | Payer: Self-pay | Source: Ambulatory Visit | Attending: Orthopaedic Surgery

## 2022-05-05 ENCOUNTER — Observation Stay (HOSPITAL_COMMUNITY): Payer: Medicare Other

## 2022-05-05 ENCOUNTER — Ambulatory Visit (HOSPITAL_BASED_OUTPATIENT_CLINIC_OR_DEPARTMENT_OTHER): Payer: Medicare Other | Admitting: Physician Assistant

## 2022-05-05 ENCOUNTER — Ambulatory Visit (HOSPITAL_COMMUNITY): Payer: Medicare Other

## 2022-05-05 DIAGNOSIS — Z950 Presence of cardiac pacemaker: Secondary | ICD-10-CM | POA: Insufficient documentation

## 2022-05-05 DIAGNOSIS — N189 Chronic kidney disease, unspecified: Secondary | ICD-10-CM | POA: Diagnosis not present

## 2022-05-05 DIAGNOSIS — M1612 Unilateral primary osteoarthritis, left hip: Secondary | ICD-10-CM | POA: Diagnosis present

## 2022-05-05 DIAGNOSIS — G4730 Sleep apnea, unspecified: Secondary | ICD-10-CM

## 2022-05-05 DIAGNOSIS — Z79899 Other long term (current) drug therapy: Secondary | ICD-10-CM | POA: Insufficient documentation

## 2022-05-05 DIAGNOSIS — I10 Essential (primary) hypertension: Secondary | ICD-10-CM | POA: Diagnosis not present

## 2022-05-05 DIAGNOSIS — I129 Hypertensive chronic kidney disease with stage 1 through stage 4 chronic kidney disease, or unspecified chronic kidney disease: Secondary | ICD-10-CM | POA: Insufficient documentation

## 2022-05-05 DIAGNOSIS — Z87891 Personal history of nicotine dependence: Secondary | ICD-10-CM | POA: Insufficient documentation

## 2022-05-05 DIAGNOSIS — Z7901 Long term (current) use of anticoagulants: Secondary | ICD-10-CM | POA: Diagnosis not present

## 2022-05-05 DIAGNOSIS — Z96651 Presence of right artificial knee joint: Secondary | ICD-10-CM | POA: Diagnosis not present

## 2022-05-05 DIAGNOSIS — Z96642 Presence of left artificial hip joint: Secondary | ICD-10-CM

## 2022-05-05 DIAGNOSIS — I4819 Other persistent atrial fibrillation: Secondary | ICD-10-CM | POA: Diagnosis not present

## 2022-05-05 DIAGNOSIS — I4891 Unspecified atrial fibrillation: Secondary | ICD-10-CM | POA: Diagnosis not present

## 2022-05-05 HISTORY — PX: TOTAL HIP ARTHROPLASTY: SHX124

## 2022-05-05 LAB — TYPE AND SCREEN
ABO/RH(D): O NEG
Antibody Screen: NEGATIVE

## 2022-05-05 SURGERY — ARTHROPLASTY, HIP, TOTAL, ANTERIOR APPROACH
Anesthesia: Spinal | Site: Hip | Laterality: Left

## 2022-05-05 MED ORDER — SODIUM CHLORIDE 0.9 % IR SOLN
Status: DC | PRN
  Administered 2022-05-05: 1000 mL

## 2022-05-05 MED ORDER — HYDROCHLOROTHIAZIDE 12.5 MG PO TABS
12.5000 mg | ORAL_TABLET | Freq: Every day | ORAL | Status: DC
  Administered 2022-05-06: 12.5 mg via ORAL
  Filled 2022-05-05: qty 1

## 2022-05-05 MED ORDER — PROPOFOL 500 MG/50ML IV EMUL
INTRAVENOUS | Status: DC | PRN
  Administered 2022-05-05: 100 ug/kg/min via INTRAVENOUS

## 2022-05-05 MED ORDER — ONDANSETRON HCL 4 MG PO TABS: 4.0000 mg | ORAL_TABLET | Freq: Four times a day (QID) | ORAL | Status: DC | PRN

## 2022-05-05 MED ORDER — PHENYLEPHRINE HCL-NACL 20-0.9 MG/250ML-% IV SOLN
INTRAVENOUS | Status: DC | PRN
  Administered 2022-05-05: 30 ug/min via INTRAVENOUS

## 2022-05-05 MED ORDER — PHENYLEPHRINE 80 MCG/ML (10ML) SYRINGE FOR IV PUSH (FOR BLOOD PRESSURE SUPPORT)
PREFILLED_SYRINGE | INTRAVENOUS | Status: DC | PRN
  Administered 2022-05-05 (×2): 120 ug via INTRAVENOUS

## 2022-05-05 MED ORDER — SODIUM CHLORIDE 0.9 % IV SOLN
INTRAVENOUS | Status: DC
Start: 2022-05-05 — End: 2022-05-06

## 2022-05-05 MED ORDER — ONDANSETRON HCL 4 MG/2ML IJ SOLN
4.0000 mg | Freq: Four times a day (QID) | INTRAMUSCULAR | Status: DC | PRN
  Administered 2022-05-05 – 2022-05-06 (×2): 4 mg via INTRAVENOUS
  Filled 2022-05-05 (×2): qty 2

## 2022-05-05 MED ORDER — OXYCODONE HCL 5 MG PO TABS
10.0000 mg | ORAL_TABLET | ORAL | Status: DC | PRN
  Administered 2022-05-05 – 2022-05-06 (×2): 15 mg via ORAL
  Filled 2022-05-05 (×2): qty 3

## 2022-05-05 MED ORDER — PROPOFOL 10 MG/ML IV BOLUS
INTRAVENOUS | Status: DC | PRN
  Administered 2022-05-05 (×3): 30 mg via INTRAVENOUS

## 2022-05-05 MED ORDER — LOSARTAN POTASSIUM 50 MG PO TABS
100.0000 mg | ORAL_TABLET | Freq: Every day | ORAL | Status: DC
  Administered 2022-05-06: 100 mg via ORAL
  Filled 2022-05-05: qty 2

## 2022-05-05 MED ORDER — CEFAZOLIN SODIUM-DEXTROSE 1-4 GM/50ML-% IV SOLN
1.0000 g | Freq: Four times a day (QID) | INTRAVENOUS | Status: AC
  Administered 2022-05-05 (×2): 1 g via INTRAVENOUS
  Filled 2022-05-05 (×2): qty 50

## 2022-05-05 MED ORDER — ORAL CARE MOUTH RINSE: 15.0000 mL | Freq: Once | OROMUCOSAL | Status: AC

## 2022-05-05 MED ORDER — PHENYLEPHRINE 80 MCG/ML (10ML) SYRINGE FOR IV PUSH (FOR BLOOD PRESSURE SUPPORT)
PREFILLED_SYRINGE | INTRAVENOUS | Status: AC
  Filled 2022-05-05: qty 10

## 2022-05-05 MED ORDER — BUPROPION HCL ER (SR) 150 MG PO TB12
150.0000 mg | ORAL_TABLET | Freq: Every day | ORAL | Status: DC
  Administered 2022-05-06: 150 mg via ORAL
  Filled 2022-05-05 (×2): qty 1

## 2022-05-05 MED ORDER — DOCUSATE SODIUM 100 MG PO CAPS
100.0000 mg | ORAL_CAPSULE | Freq: Two times a day (BID) | ORAL | Status: DC
  Administered 2022-05-05 – 2022-05-06 (×2): 100 mg via ORAL
  Filled 2022-05-05 (×2): qty 1

## 2022-05-05 MED ORDER — ONDANSETRON HCL 4 MG/2ML IJ SOLN
4.0000 mg | Freq: Once | INTRAMUSCULAR | Status: DC | PRN
Start: 2022-05-05 — End: 2022-05-05

## 2022-05-05 MED ORDER — ACETAMINOPHEN 325 MG PO TABS: 325.0000 mg | ORAL_TABLET | Freq: Four times a day (QID) | ORAL | Status: DC | PRN

## 2022-05-05 MED ORDER — METHOCARBAMOL 500 MG PO TABS
500.0000 mg | ORAL_TABLET | Freq: Four times a day (QID) | ORAL | Status: DC | PRN
  Administered 2022-05-05 – 2022-05-06 (×3): 500 mg via ORAL
  Filled 2022-05-05 (×3): qty 1

## 2022-05-05 MED ORDER — BUPIVACAINE IN DEXTROSE 0.75-8.25 % IT SOLN
INTRATHECAL | Status: DC | PRN
  Administered 2022-05-05: 12 mg via INTRATHECAL

## 2022-05-05 MED ORDER — APIXABAN 5 MG PO TABS
5.0000 mg | ORAL_TABLET | Freq: Two times a day (BID) | ORAL | Status: DC
  Administered 2022-05-06: 5 mg via ORAL
  Filled 2022-05-05: qty 1

## 2022-05-05 MED ORDER — OXYCODONE HCL 5 MG PO TABS
5.0000 mg | ORAL_TABLET | ORAL | Status: DC | PRN
  Administered 2022-05-05 – 2022-05-06 (×3): 10 mg via ORAL
  Filled 2022-05-05 (×3): qty 2

## 2022-05-05 MED ORDER — POVIDONE-IODINE 10 % EX SWAB
2.0000 "application " | Freq: Once | CUTANEOUS | Status: AC
  Administered 2022-05-05: 2 via TOPICAL

## 2022-05-05 MED ORDER — EPHEDRINE SULFATE-NACL 50-0.9 MG/10ML-% IV SOSY
PREFILLED_SYRINGE | INTRAVENOUS | Status: DC | PRN
  Administered 2022-05-05 (×3): 5 mg via INTRAVENOUS

## 2022-05-05 MED ORDER — SIMVASTATIN 40 MG PO TABS
40.0000 mg | ORAL_TABLET | Freq: Every day | ORAL | Status: DC
  Administered 2022-05-06: 40 mg via ORAL
  Filled 2022-05-05: qty 1

## 2022-05-05 MED ORDER — PHENOL 1.4 % MT LIQD: 1.0000 | OROMUCOSAL | Status: DC | PRN

## 2022-05-05 MED ORDER — FENTANYL CITRATE (PF) 100 MCG/2ML IJ SOLN
INTRAMUSCULAR | Status: AC
  Filled 2022-05-05: qty 2

## 2022-05-05 MED ORDER — GLYCOPYRROLATE 0.2 MG/ML IJ SOLN
INTRAMUSCULAR | Status: AC
  Filled 2022-05-05: qty 1

## 2022-05-05 MED ORDER — CHLORHEXIDINE GLUCONATE 0.12 % MT SOLN
15.0000 mL | Freq: Once | OROMUCOSAL | Status: AC
  Administered 2022-05-05: 15 mL via OROMUCOSAL

## 2022-05-05 MED ORDER — 0.9 % SODIUM CHLORIDE (POUR BTL) OPTIME
TOPICAL | Status: DC | PRN
  Administered 2022-05-05: 1000 mL

## 2022-05-05 MED ORDER — PANTOPRAZOLE SODIUM 40 MG PO TBEC
40.0000 mg | DELAYED_RELEASE_TABLET | Freq: Every day | ORAL | Status: DC
  Administered 2022-05-05 – 2022-05-06 (×2): 40 mg via ORAL
  Filled 2022-05-05 (×2): qty 1

## 2022-05-05 MED ORDER — MENTHOL 3 MG MT LOZG: 1.0000 | LOZENGE | OROMUCOSAL | Status: DC | PRN

## 2022-05-05 MED ORDER — PROPOFOL 500 MG/50ML IV EMUL
INTRAVENOUS | Status: AC
  Filled 2022-05-05: qty 50

## 2022-05-05 MED ORDER — PROPOFOL 1000 MG/100ML IV EMUL
INTRAVENOUS | Status: AC
  Filled 2022-05-05: qty 100

## 2022-05-05 MED ORDER — STERILE WATER FOR IRRIGATION IR SOLN
Status: DC | PRN
  Administered 2022-05-05: 2000 mL

## 2022-05-05 MED ORDER — SERTRALINE HCL 25 MG PO TABS
25.0000 mg | ORAL_TABLET | Freq: Every day | ORAL | Status: DC
  Administered 2022-05-05: 25 mg via ORAL
  Filled 2022-05-05: qty 1

## 2022-05-05 MED ORDER — METOCLOPRAMIDE HCL 5 MG/ML IJ SOLN: 5.0000 mg | Freq: Three times a day (TID) | INTRAMUSCULAR | Status: DC | PRN

## 2022-05-05 MED ORDER — FENTANYL CITRATE PF 50 MCG/ML IJ SOSY: 25.0000 ug | PREFILLED_SYRINGE | INTRAMUSCULAR | Status: DC | PRN

## 2022-05-05 MED ORDER — METOCLOPRAMIDE HCL 5 MG PO TABS: 5.0000 mg | ORAL_TABLET | Freq: Three times a day (TID) | ORAL | Status: DC | PRN

## 2022-05-05 MED ORDER — TRANEXAMIC ACID-NACL 1000-0.7 MG/100ML-% IV SOLN
1000.0000 mg | INTRAVENOUS | Status: AC
  Administered 2022-05-05: 1000 mg via INTRAVENOUS
  Filled 2022-05-05: qty 100

## 2022-05-05 MED ORDER — LOSARTAN POTASSIUM-HCTZ 100-12.5 MG PO TABS: 1.0000 | ORAL_TABLET | Freq: Every day | ORAL | Status: DC

## 2022-05-05 MED ORDER — CEFAZOLIN SODIUM-DEXTROSE 2-4 GM/100ML-% IV SOLN
2.0000 g | INTRAVENOUS | Status: AC
  Administered 2022-05-05: 2 g via INTRAVENOUS
  Filled 2022-05-05: qty 100

## 2022-05-05 MED ORDER — HYDROMORPHONE HCL 1 MG/ML IJ SOLN
0.5000 mg | INTRAMUSCULAR | Status: DC | PRN
  Administered 2022-05-05: 1 mg via INTRAVENOUS
  Filled 2022-05-05: qty 1

## 2022-05-05 MED ORDER — MELATONIN 5 MG PO TABS
5.0000 mg | ORAL_TABLET | Freq: Every evening | ORAL | Status: DC | PRN
  Administered 2022-05-05: 5 mg via ORAL
  Filled 2022-05-05: qty 1

## 2022-05-05 MED ORDER — LACTATED RINGERS IV SOLN: INTRAVENOUS | Status: DC

## 2022-05-05 MED ORDER — EPHEDRINE 5 MG/ML INJ
INTRAVENOUS | Status: AC
  Filled 2022-05-05: qty 5

## 2022-05-05 MED ORDER — DOXAZOSIN MESYLATE 4 MG PO TABS
4.0000 mg | ORAL_TABLET | Freq: Every day | ORAL | Status: DC
  Administered 2022-05-06: 4 mg via ORAL
  Filled 2022-05-05: qty 1

## 2022-05-05 MED ORDER — PHENYLEPHRINE HCL-NACL 20-0.9 MG/250ML-% IV SOLN
INTRAVENOUS | Status: AC
  Filled 2022-05-05: qty 500

## 2022-05-05 MED ORDER — FENTANYL CITRATE (PF) 100 MCG/2ML IJ SOLN
INTRAMUSCULAR | Status: DC | PRN
Start: 2022-05-05 — End: 2022-05-05
  Administered 2022-05-05 (×2): 50 ug via INTRAVENOUS

## 2022-05-05 MED ORDER — ACETAMINOPHEN 10 MG/ML IV SOLN
1000.0000 mg | Freq: Once | INTRAVENOUS | Status: DC | PRN
Start: 2022-05-05 — End: 2022-05-05

## 2022-05-05 MED ORDER — ALUM & MAG HYDROXIDE-SIMETH 200-200-20 MG/5ML PO SUSP
30.0000 mL | ORAL | Status: DC | PRN
  Administered 2022-05-05 – 2022-05-06 (×3): 30 mL via ORAL
  Filled 2022-05-05 (×3): qty 30

## 2022-05-05 MED ORDER — DIPHENHYDRAMINE HCL 12.5 MG/5ML PO ELIX: 12.5000 mg | ORAL_SOLUTION | ORAL | Status: DC | PRN

## 2022-05-05 MED ORDER — METHOCARBAMOL 1000 MG/10ML IJ SOLN: 500.0000 mg | Freq: Four times a day (QID) | INTRAVENOUS | Status: DC | PRN

## 2022-05-05 SURGICAL SUPPLY — 43 items
ACETAB CUP W/GRIPTION 54 (Plate) ×2 IMPLANT
ARTICULEZE HEAD (Hips) ×2 IMPLANT
BAG COUNTER SPONGE SURGICOUNT (BAG) ×2 IMPLANT
BAG ZIPLOCK 12X15 (MISCELLANEOUS) ×1 IMPLANT
BENZOIN TINCTURE PRP APPL 2/3 (GAUZE/BANDAGES/DRESSINGS) ×1 IMPLANT
BLADE SAW SGTL 18X1.27X75 (BLADE) ×2 IMPLANT
CLSR STERI-STRIP ANTIMIC 1/2X4 (GAUZE/BANDAGES/DRESSINGS) ×1 IMPLANT
COVER PERINEAL POST (MISCELLANEOUS) ×2 IMPLANT
COVER SURGICAL LIGHT HANDLE (MISCELLANEOUS) ×2 IMPLANT
CUP ACETAB W/GRIPTION 54 (Plate) IMPLANT
DRAPE FOOT SWITCH (DRAPES) ×2 IMPLANT
DRAPE STERI IOBAN 125X83 (DRAPES) ×2 IMPLANT
DRAPE U-SHAPE 47X51 STRL (DRAPES) ×4 IMPLANT
DRSG AQUACEL AG ADV 3.5X10 (GAUZE/BANDAGES/DRESSINGS) ×2 IMPLANT
DURAPREP 26ML APPLICATOR (WOUND CARE) ×2 IMPLANT
ELECT REM PT RETURN 15FT ADLT (MISCELLANEOUS) ×2 IMPLANT
GAUZE XEROFORM 1X8 LF (GAUZE/BANDAGES/DRESSINGS) ×2 IMPLANT
GLOVE BIO SURGEON STRL SZ7.5 (GLOVE) ×2 IMPLANT
GLOVE BIOGEL PI IND STRL 8 (GLOVE) ×2 IMPLANT
GLOVE BIOGEL PI INDICATOR 8 (GLOVE) ×2
GLOVE ECLIPSE 8.0 STRL XLNG CF (GLOVE) ×2 IMPLANT
GOWN STRL REUS W/ TWL XL LVL3 (GOWN DISPOSABLE) ×2 IMPLANT
GOWN STRL REUS W/TWL XL LVL3 (GOWN DISPOSABLE) ×2
HANDPIECE INTERPULSE COAX TIP (DISPOSABLE) ×1
HEAD ARTICULEZE (Hips) IMPLANT
HOLDER FOLEY CATH W/STRAP (MISCELLANEOUS) ×2 IMPLANT
KIT TURNOVER KIT A (KITS) ×1 IMPLANT
LINER NEUTRAL 54X36MM PLUS 4 (Hips) ×1 IMPLANT
PACK ANTERIOR HIP CUSTOM (KITS) ×2 IMPLANT
SCREW 6.5MMX25MM (Screw) ×1 IMPLANT
SET HNDPC FAN SPRY TIP SCT (DISPOSABLE) ×1 IMPLANT
STAPLER VISISTAT 35W (STAPLE) ×1 IMPLANT
STEM FEMORAL SZ 6MM STD ACTIS (Stem) ×1 IMPLANT
STRIP CLOSURE SKIN 1/2X4 (GAUZE/BANDAGES/DRESSINGS) IMPLANT
SUT ETHIBOND NAB CT1 #1 30IN (SUTURE) ×2 IMPLANT
SUT ETHILON 2 0 PS N (SUTURE) IMPLANT
SUT MNCRL AB 4-0 PS2 18 (SUTURE) IMPLANT
SUT VIC AB 0 CT1 36 (SUTURE) ×2 IMPLANT
SUT VIC AB 1 CT1 36 (SUTURE) ×2 IMPLANT
SUT VIC AB 2-0 CT1 27 (SUTURE) ×2
SUT VIC AB 2-0 CT1 TAPERPNT 27 (SUTURE) ×2 IMPLANT
TRAY FOLEY MTR SLVR 16FR STAT (SET/KITS/TRAYS/PACK) IMPLANT
YANKAUER SUCT BULB TIP NO VENT (SUCTIONS) ×2 IMPLANT

## 2022-05-05 NOTE — Anesthesia Procedure Notes (Signed)
Spinal  Patient location during procedure: OR Start time: 05/05/2022 8:22 AM End time: 05/05/2022 8:26 AM Reason for block: surgical anesthesia Staffing Performed: anesthesiologist  Anesthesiologist: Trevor Iha, MD Performed by: Trevor Iha, MD Authorized by: Trevor Iha, MD   Preanesthetic Checklist Completed: patient identified, IV checked, site marked, risks and benefits discussed, surgical consent, monitors and equipment checked, pre-op evaluation and timeout performed Spinal Block Patient position: sitting Prep: DuraPrep and site prepped and draped Patient monitoring: heart rate, cardiac monitor, continuous pulse ox and blood pressure Approach: midline Location: L3-4 Injection technique: single-shot Needle Needle type: Sprotte  Needle gauge: 24 G Needle length: 9 cm Needle insertion depth: 6 cm Assessment Sensory level: T4 Events: CSF return Additional Notes  1 Attempt (s). Pt tolerated procedure well.

## 2022-05-05 NOTE — Brief Op Note (Signed)
05/05/2022  9:36 AM  PATIENT:  Jimmy Palmer  81 y.o. 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   Delay start of Pharmacological VTE agent (>24hrs) due to surgical blood loss or risk of bleeding: no

## 2022-05-05 NOTE — Evaluation (Signed)
Physical Therapy Evaluation Patient Details Name: Jimmy Palmer MRN: [id number] DOB: 09/21/1941 Today's Date: 05/05/2022  History of Present Illness  Pt is a 81yo male presenting s/p L-THA, AA on 05/01/22. PMH: CKD, HTN, BBB, pacemaker, R-uni knee.  Clinical Impression  [name] is a 81 y.o. male POD 0 s/p L-THA, AA. Patient reports modified independence using SPC for mobility only in the AM when he is stiff, otherwise ambulates without AD at baseline. Patient is now limited by functional impairments (see PT problem list below) and requires min guard for transfers and gait with RW. Patient was able to ambulate 60 feet with RW and min guard. Patient instructed in exercise to facilitate ROM and circulation to manage edema. Patient will benefit from continued skilled PT interventions to address impairments and progress towards PLOF. Acute PT will follow to progress mobility and stair training in preparation for safe discharge home.       Recommendations for follow up therapy are one component of a multi-disciplinary discharge planning process, led by the attending physician.  Recommendations may be updated based on patient status, additional functional criteria and insurance authorization.  Follow Up Recommendations Follow physician's recommendations for discharge plan and follow up therapies    Assistance Recommended at Discharge Set up Supervision/Assistance  Patient can return home with the following  A little help with walking and/or transfers;A little help with bathing/dressing/bathroom;Assistance with cooking/housework;Assist for transportation;Help with stairs or ramp for entrance    Equipment Recommendations None recommended by PT (Pt has recommended DME)  Recommendations for Other Services       Functional Status Assessment Patient has had a recent decline in their functional status and demonstrates the ability to make significant improvements in function in a reasonable and predictable  amount of time.     Precautions / Restrictions Precautions Precautions: Fall Restrictions Weight Bearing Restrictions: No      Mobility  Bed Mobility Overal bed mobility: Needs Assistance Bed Mobility: Supine to Sit     Supine to sit: Supervision     General bed mobility comments: for safety only, no physical assist provided    Transfers Overall transfer level: Needs assistance Equipment used: Rolling walker (2 wheels) Transfers: Sit to/from Stand Sit to Stand: Min guard           General transfer comment: for safety only, no physical assist provided, VCs for sequencing and placement of hands    Ambulation/Gait Ambulation/Gait assistance: Min guard, +2 safety/equipment Gait Distance (Feet): 60 Feet Assistive device: Rolling walker (2 wheels) Gait Pattern/deviations: Step-through pattern Gait velocity: decreased     General Gait Details: Pt ambulated 59ft with RW and min guard +2 for recliner follow, no physical assist required or overt LOB. started with step-to pattern progressed to step-through with cues for shortening stride length. After original ambulation session, pt reporting need to use restroom, ambulated from recliner to toilet and back with min guard to supervision, no physical assist required, approximately 20 ft total.  Stairs            Wheelchair Mobility    Modified Rankin (Stroke Patients Only)       Balance Overall balance assessment: Needs assistance Sitting-balance support: Feet supported, No upper extremity supported Sitting balance-Leahy Scale: Fair     Standing balance support: Reliant on assistive device for balance, During functional activity, Bilateral upper extremity supported Standing balance-Leahy Scale: Poor  Pertinent Vitals/Pain Pain Assessment Pain Assessment: 0-10 Pain Score: 7  Pain Location: left hip Pain Descriptors / Indicators: Operative site guarding,  Discomfort Pain Intervention(s): Limited activity within patient's tolerance, Monitored during session, Repositioned, Ice applied    Home Living Family/patient expects to be discharged to:: Private residence Living Arrangements: Spouse/significant other Available Help at Discharge: Family;Available 24 hours/day (Wife is critical care nurse) Type of Home: House Home Access: Stairs to enter Entrance Stairs-Rails: Right (stainless steel grab bar) Entrance Stairs-Number of Steps: 2   Home Layout: One level Home Equipment: Conservation officer, nature (2 wheels);Cane - single point;Grab bars - tub/shower;BSC/3in1 Additional Comments: retired Regulatory affairs officer    Prior Function Prior Level of Function : Independent/Modified Independent             Mobility Comments: uses SPC in the morning only due to stiffness ADLs Comments: ind     Hand Dominance        Extremity/Trunk Assessment   Upper Extremity Assessment Upper Extremity Assessment: Overall WFL for tasks assessed    Lower Extremity Assessment Lower Extremity Assessment: LLE deficits/detail;RLE deficits/detail RLE Deficits / Details: MMT ank DF/PF 5/5 RLE Sensation: WNL LLE Deficits / Details: MMT ank DF/PF 5/5 LLE Sensation: WNL    Cervical / Trunk Assessment Cervical / Trunk Assessment: Kyphotic  Communication   Communication: No difficulties  Cognition Arousal/Alertness: Awake/alert Behavior During Therapy: WFL for tasks assessed/performed Overall Cognitive Status: Within Functional Limits for tasks assessed                                          General Comments      Exercises Total Joint Exercises Ankle Circles/Pumps: AROM, Both, 20 reps   Assessment/Plan    PT Assessment Patient needs continued PT services  PT Problem List Decreased strength;Decreased range of motion;Decreased activity tolerance;Decreased balance;Decreased mobility;Decreased coordination;Pain;Decreased safety awareness       PT  Treatment Interventions DME instruction;Gait training;Stair training;Functional mobility training;Therapeutic activities;Therapeutic exercise;Balance training;Neuromuscular re-education;Patient/family education    PT Goals (Current goals can be found in the Care Plan section)  Acute Rehab PT Goals Patient Stated Goal: To resume activie lifestyle PT Goal Formulation: With patient Time For Goal Achievement: 05/12/22 Potential to Achieve Goals: Good    Frequency 7X/week     Co-evaluation               AM-PAC PT "6 Clicks" Mobility  Outcome Measure Help needed turning from your back to your side while in a flat bed without using bedrails?: None Help needed moving from lying on your back to sitting on the side of a flat bed without using bedrails?: None Help needed moving to and from a bed to a chair (including a wheelchair)?: A Little Help needed standing up from a chair using your arms (e.g., wheelchair or bedside chair)?: A Little Help needed to walk in hospital room?: A Little Help needed climbing 3-5 steps with a railing? : A Little 6 Click Score: 20    End of Session Equipment Utilized During Treatment: Gait belt Activity Tolerance: Patient tolerated treatment well;No increased pain Patient left: in chair;with call bell/phone within reach;with chair alarm set;with SCD's reapplied Nurse Communication: Mobility status PT Visit Diagnosis: Difficulty in walking, not elsewhere classified (R26.2);Pain Pain - Right/Left: Left Pain - part of body: Hip    Time: [id number] PT Time Calculation (min) (ACUTE ONLY): 37 min   Charges:  PT Evaluation $PT Eval Low Complexity: 1 Low PT Treatments $Gait Training: 8-22 mins       Coolidge Breeze, PT, DPT [facility] Office: [phone] Pager: [phone]  Coolidge Breeze 05/05/2022, 3:46 PM

## 2022-05-05 NOTE — TOC Transition Note (Signed)
Transition of Care [facility]) - CM/SW Discharge Note   Patient Details  Name: Jimmy Palmer MRN: [id number] Date of Birth: [birth year]  [facility]) CM/SW Contact:  Lennart Pall, LCSW Phone Number: 05/05/2022, 1:38 PM   Clinical Narrative:    Met with pt and wife today and confirming pt has all needed DME at home.  They are aware that HHPT has been prearranged with [facility].  No further TOC needs.   Final next level of care: [place] Barriers to Discharge: No Barriers Identified   Patient Goals and CMS Choice Patient states their goals for this hospitalization and ongoing recovery are:: return home      Discharge Placement                       Discharge Plan and Services                DME Arranged: N/A DME Agency: NA       HH Arranged: PT [place] Agency: [facility]        Social Determinants of Health (SDOH) Interventions     Readmission Risk Interventions     No data to display

## 2022-05-05 NOTE — Interval H&P Note (Signed)
History and Physical Interval Note: The patient understands that he is here today for a left hip replacement to treat his left hip osteoarthritis.  There has been no acute change in medical status.  See H&P.  The risks and benefits of surgery been explained in detail and informed consent is obtained.  The left operative hip has been marked.  05/05/2022 7:00 AM  Jimmy Palmer  has presented today for surgery, with the diagnosis of OSTEOARTHRITIS / [place].  The various methods of treatment have been discussed with the patient and family. After consideration of risks, benefits and other options for treatment, the patient has consented to  Procedure(s): LEFT TOTAL HIP ARTHROPLASTY ANTERIOR APPROACH (Left) as a surgical intervention.  The patient's history has been reviewed, patient examined, no change in status, stable for surgery.  I have reviewed the patient's chart and labs.  Questions were answered to the patient's satisfaction.     Mcarthur Rossetti

## 2022-05-05 NOTE — Plan of Care (Signed)
  Problem: Education: Goal: Knowledge of General Education information will improve Description: Including pain rating scale, medication(s)/side effects and non-pharmacologic comfort measures Outcome: Progressing   Problem: Education: Goal: Knowledge of the prescribed therapeutic regimen will improve Outcome: Progressing   Problem: Activity: Goal: Ability to avoid complications of mobility impairment will improve Outcome: Progressing   Problem: Clinical Measurements: Goal: Postoperative complications will be avoided or minimized Outcome: Progressing   Problem: Pain Management: Goal: Pain level will decrease with appropriate interventions Outcome: Progressing   

## 2022-05-05 NOTE — Anesthesia Procedure Notes (Signed)
Procedure Name: MAC Date/Time: 05/05/2022 8:20 AM  Performed by: Claudia Desanctis, CRNAPre-anesthesia Checklist: Patient identified, Emergency Drugs available, Suction available and Patient being monitored Patient Re-evaluated:Patient Re-evaluated prior to induction Oxygen Delivery Method: Simple face mask

## 2022-05-05 NOTE — Anesthesia Postprocedure Evaluation (Signed)
Anesthesia Post Note  Patient: Jimmy Palmer  Procedure(s) Performed: LEFT TOTAL HIP ARTHROPLASTY ANTERIOR APPROACH (Left: Hip)     Patient location during evaluation: Mother Baby Anesthesia Type: Spinal Level of consciousness: oriented and awake and alert Pain management: pain level controlled Vital Signs Assessment: post-procedure vital signs reviewed and stable Respiratory status: spontaneous breathing and respiratory function stable Cardiovascular status: blood pressure returned to baseline and stable Postop Assessment: no headache, no backache, no apparent nausea or vomiting and able to ambulate Anesthetic complications: no   No notable events documented.  Last Vitals:  Vitals:   05/05/22 1200 05/05/22 1422  BP: (!) 144/91 (!) 119/59  Pulse: 61 79  Resp: 16 16  Temp:  (!) 36.4 C  SpO2: 98% 94%    Last Pain:  Vitals:   05/05/22 1422  TempSrc: Oral  PainSc:                  Trevor Iha

## 2022-05-05 NOTE — Transfer of Care (Signed)
Immediate Anesthesia Transfer of Care Note  Patient: Jimmy Palmer  Procedure(s) Performed: LEFT TOTAL HIP ARTHROPLASTY ANTERIOR APPROACH (Left: Hip)  Patient Location: PACU  Anesthesia Type:Spinal  Level of Consciousness: awake and patient cooperative  Airway & Oxygen Therapy: Patient Spontanous Breathing and Patient connected to face mask  Post-op Assessment: Report given to RN and Post -op Vital signs reviewed and stable  Post vital signs: Reviewed and stable  Last Vitals:  Vitals Value Taken Time  BP 74/50 05/05/22 0951  Temp    Pulse 79 05/05/22 0953  Resp 20 05/05/22 0953  SpO2 100 % 05/05/22 0953  Vitals shown include unvalidated device data.  Last Pain:  Vitals:   05/05/22 0616  TempSrc:   PainSc: 0-No pain         Complications: No notable events documented.

## 2022-05-05 NOTE — Op Note (Signed)
NAME: Jimmy Palmer RECORD NO: [id number] ACCOUNT NO: [id number] DATE OF BIRTH: May 16, 1941 FACILITY: [facility] LOCATION: [facility] PHYSICIAN: Lind Guest. Ninfa Linden, MD  Operative Report   DATE OF PROCEDURE: 05/05/2022  PREOPERATIVE DIAGNOSIS:  Primary osteoarthritis and degenerative joint disease, left hip.  POSTOPERATIVE DIAGNOSIS:  Primary osteoarthritis and degenerative joint disease, left hip.  PROCEDURE:  Left total hip arthroplasty through direct anterior approach.  IMPLANTS:  DePuy sector Gription acetabular component, size 54 with a single screw, size 36+4 neutral polyethylene liner, size 6 ACTIS femoral component with standard offset, size 36+5 metal hip ball.  SURGEON:  Lind Guest. Ninfa Linden, MD  ASSISTANT:  Benita Stabile, PA-C.  ANESTHESIA:  Spinal.  ANTIBIOTICS:  2 g IV Ancef.  ESTIMATED BLOOD LOSS:  [id number] mL  COMPLICATIONS:  None.  INDICATIONS:  The patient is a 81 year old gentleman with well-documented severe arthritis with bone-on-bone wear of the left hip.  He has tried and failed all forms of conservative treatment.  His left hip pain is daily and is detrimentally affecting  his mobility, his quality of life and his activities of daily living to the point he does wish to proceed with total hip arthroplasty.  We did talk in length and detail about the risk of acute blood loss anemia, nerve or vessel injury, fracture,  infection, DVT, implant failure, leg length differences and skin and soft tissue issues.  He understands our goals are to decrease pain, improve mobility and overall improve quality of life.  DESCRIPTION OF PROCEDURE:  After informed consent was obtained, appropriate left hip was marked, he was brought to the operating room and sat up on the stretcher where spinal anesthesia was obtained.  He was laid in supine position on the stretcher.  His  leg lengths were assessed and it is definitely shorter on the left side than the right.  Foley catheter was  placed and the extraction boots were placed on both his feet.  He was then placed supine on the Hana fracture table. The perineal post in place  and both legs in line with skeletal traction device. There has been no traction applied.  His left operative hip was prepped and draped with DuraPrep and sterile drapes.  A timeout was called.  He was identified correct patient, correct left hip.  I then  made an incision just inferior and posterior to the anterior superior iliac spine and carried this obliquely down the leg.  We dissected down tensor fascia lata muscle.  Tensor fascia was then divided longitudinally to proceed with direct anterior  approach to the hip.  I identified and cauterized circumflex vessels, I then identified the hip capsule, opened the hip capsule in L-type format finding a large joint effusion and significant arthritis around the lateral femoral head and neck.  I placed  curved retractors around the medial and lateral femoral neck and made a femoral neck cut with an oscillating saw just proximal to the lesser trochanter and completed this with an osteotome.  I placed a corkscrew guide in the femoral head and removed the  femoral head in its entirety and found a wide area devoid of cartilage and some flattening as well.  I then placed a bent Hohmann over the medial acetabular rim and removed remnants of acetabular labrum and other debris.  I then began reaming under  direct visualization from a size 43 reamer going all the way up in a stepwise increments to a size 53 reamer, with all reamers placed under direct visualization.  The last reamer was placed under direct fluoroscopy, so we continued our depth of reaming,  our inclination and anteversion.  I then placed real DePuy sector Gription acetabular component with size 54 and went with a single 25 mm dome screw as well.  We then placed a 36+4 neutral polyethylene liner for that size acetabular component.  Attention  was then turned to  the femur.  With the leg externally rotated to 120 degrees and extended and adducted we were able to place a Mueller retractor medially and Hohmann retractor behind the greater trochanter.  We released lateral joint capsule and used a  box cutting osteotome to enter femoral canal and a rongeur to lateralize. We then began broaching using the ACTIS broaching system from a size 0 going up to a size 6.  With a size 6 in place, we trialed a standard offset femoral neck and a 36+1.5 hip  ball.  We brought the leg back over in upward traction, internal rotation reducing the pelvis.  It was stable on exam.  We felt like we needed just a little bit more leg length.  We dislocated the hip and removed the trial components.  We placed the real  ACTIS femoral component, size 6 with standard offset and went with a 36+5 metal hip ball and again reduced this in acetabulum.  We were pleased with leg length, offset, range of motion and stability assessed mechanically and radiographically.  We then  irrigated the soft tissue with normal saline solution.  The joint capsule was closed with interrupted #1 Ethibond suture followed by #1 Vicryl to close the tensor fascia, 0 Vicryl was used to close the deep tissue and 2-0 Vicryl was used to close  subcutaneous tissue.  The skin was closed with staples.  Aquacel dressing was applied.  He was taken off the Hana table and taken to recovery room in stable condition with all final counts being correct.  No complications noted.  Of note, Benita Stabile,  PA-C, assisted during the entire case from beginning to the end.  His assistance was medically necessary and crucial for soft tissue management and retracting as well as helping guide implant placement and layered closure of the wound.   PUS D: 05/05/2022 9:34:49 am T: 05/05/2022 10:33:00 am  JOB: [id number]

## 2022-05-06 DIAGNOSIS — M1612 Unilateral primary osteoarthritis, left hip: Secondary | ICD-10-CM | POA: Diagnosis not present

## 2022-05-06 LAB — CBC
HCT: 32.1 % — ABNORMAL LOW (ref 39.0–52.0)
Hemoglobin: 10.6 g/dL — ABNORMAL LOW (ref 13.0–17.0)
MCH: 31.1 pg (ref 26.0–34.0)
MCHC: 33 g/dL (ref 30.0–36.0)
MCV: 94.1 fL (ref 80.0–100.0)
Platelets: 168 10*3/uL (ref 150–400)
RBC: 3.41 MIL/uL — ABNORMAL LOW (ref 4.22–5.81)
RDW: 12.3 % (ref 11.5–15.5)
WBC: 16.1 10*3/uL — ABNORMAL HIGH (ref 4.0–10.5)
nRBC: 0 % (ref 0.0–0.2)

## 2022-05-06 LAB — BASIC METABOLIC PANEL
Anion gap: 7 (ref 5–15)
BUN: 17 mg/dL (ref 8–23)
CO2: 25 mmol/L (ref 22–32)
Calcium: 8.6 mg/dL — ABNORMAL LOW (ref 8.9–10.3)
Chloride: 103 mmol/L (ref 98–111)
Creatinine, Ser: 1.22 mg/dL (ref 0.61–1.24)
GFR, Estimated: >60 mL/min (ref >60)
Glucose, Bld: 132 mg/dL — ABNORMAL HIGH (ref 70–99)
Potassium: 4.2 mmol/L (ref 3.5–5.1)
Sodium: 135 mmol/L (ref 135–145)

## 2022-05-06 MED ORDER — OXYCODONE HCL 5 MG PO TABS: 5.0000 mg | ORAL_TABLET | ORAL | 0 refills | Status: DC | PRN

## 2022-05-06 NOTE — Discharge Summary (Signed)
Patient ID: Jimmy Palmer MRN: [id number] DOB/AGE: October 23, 1941 81 y.o.  Admit date: 05/05/2022 Discharge date: 05/06/2022  Admission Diagnoses:  Principal Problem:   Arthritis of left hip Active Problems:   Status post total replacement of left hip   Discharge Diagnoses:  Same  Past Medical History:  Diagnosis Date   Abnormal electrocardiogram    Abnormal heart sounds    Allergy 2020   upon arrival to [facility]   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   Chronic kidney disease 11/07/2021   Depression 2020   receiving appropriate therapy   Herniated lumbar intervertebral disc    Hypertension    Keratosis    benign   Left bundle branch block    Pacemaker    Pure hypercholesterolemia    Sick sinus syndrome (HCC)    Sleep apnea    VT (ventricular tachycardia) ([place])     Surgeries: Procedure(s): LEFT TOTAL HIP ARTHROPLASTY ANTERIOR APPROACH on 05/05/2022   Consultants:   Discharged Condition: Improved  Hospital Course: DIMITRY MEISE is an 81 y.o. male who was admitted 05/05/2022 for operative treatment ofArthritis of left hip. Patient has severe unremitting pain that affects sleep, daily activities, and work/hobbies. After pre-op clearance the patient was taken to the operating room on 05/05/2022 and underwent  Procedure(s): LEFT TOTAL HIP ARTHROPLASTY ANTERIOR APPROACH.    Patient was given perioperative antibiotics:  Anti-infectives (From admission, onward)    Start     Dose/Rate Route Frequency Ordered Stop   05/05/22 1400  ceFAZolin (ANCEF) IVPB 1 g/50 mL premix        1 g 100 mL/hr over 30 Minutes Intravenous Every 6 hours 05/05/22 1157 05/05/22 2120   05/05/22 0600  ceFAZolin (ANCEF) IVPB 2g/100 mL premix        2 g 200 mL/hr over 30 Minutes Intravenous On call to O.R. 05/05/22 0530 05/05/22 [id number]        Patient was given sequential compression devices, early ambulation, and chemoprophylaxis to prevent DVT.  Patient benefited maximally from hospital stay and there  were no complications.    Recent vital signs: Patient Vitals for the past 24 hrs:  BP Temp Temp src Pulse Resp SpO2 Height Weight  05/06/22 0941 (!) 124/58 -- -- 83 -- -- -- --  05/06/22 0537 134/65 98.5 F (36.9 C) Oral 90 20 95 % -- --  05/06/22 0155 (!) 117/49 98.9 F (37.2 C) Oral 76 20 98 % -- --  05/05/22 2200 135/67 97.9 F (36.6 C) Oral 80 20 95 % -- --  05/05/22 1814 112/73 97.6 F (36.4 C) -- 85 15 98 % -- --  05/05/22 1717 98/61 97.6 F (36.4 C) Oral 78 16 95 % -- --  05/05/22 1422 (!) 119/59 (!) 97.5 F (36.4 C) Oral 79 16 94 % -- --  05/05/22 1200 (!) 144/91 -- -- 61 16 98 % 5\' 9"  (1.753 m) 80 kg  05/05/22 1145 135/71 (!) 97.5 F (36.4 C) -- 63 16 100 % -- --  05/05/22 1130 138/78 -- -- 63 15 98 % -- --  05/05/22 1115 136/65 -- -- 60 13 99 % -- --  05/05/22 1100 124/62 -- -- 60 15 100 % -- --  05/05/22 1045 (!) 133/58 -- -- 61 18 100 % -- --  05/05/22 1030 131/62 -- -- 61 18 98 % -- --  05/05/22 1015 127/63 -- -- 66 14 99 % -- --  05/05/22 1000 106/60 -- --  74 19 99 % -- --     Recent laboratory studies:  Recent Labs    05/06/22 0311  WBC 16.1*  HGB 10.6*  HCT 32.1*  PLT 168  NA 135  K 4.2  CL 103  CO2 25  BUN 17  CREATININE 1.22  GLUCOSE 132*  CALCIUM 8.6*     Discharge Medications:   Allergies as of 05/06/2022       Reactions   Amoxicillin Diarrhea        Medication List     TAKE these medications    apixaban 5 MG Tabs tablet Commonly known as: Eliquis Take 1 tablet (5 mg total) by mouth 2 (two) times daily.   buPROPion 150 MG 12 hr tablet Commonly known as: WELLBUTRIN SR Take 150 mg by mouth daily.   diclofenac Sodium 1 % Gel Commonly known as: VOLTAREN Apply 1 application topically as needed (pain).   diphenhydrAMINE 25 mg capsule Commonly known as: BENADRYL Take 25 mg by mouth at bedtime.   doxazosin 4 MG tablet Commonly known as: CARDURA TAKE 1 TABLET BY MOUTH EVERY DAY   losartan-hydrochlorothiazide 100-12.5 MG  tablet Commonly known as: HYZAAR TAKE 1 TABLET BY MOUTH EVERY DAY   melatonin 5 MG Tabs Take 5 mg by mouth at bedtime.   MULTIVITAMIN PO Take 2 tablets by mouth once a week.   oxyCODONE 5 MG immediate release tablet Commonly known as: Oxy IR/ROXICODONE Take 1-2 tablets (5-10 mg total) by mouth every 4 (four) hours as needed for moderate pain (pain score 4-6).   phenylephrine 0.25 % suppository Commonly known as: (USE for PREPARATION-H) Place 1 suppository rectally 2 (two) times daily as needed for hemorrhoids.   sertraline 25 MG tablet Commonly known as: ZOLOFT Take 25 mg by mouth daily.   simvastatin 40 MG tablet Commonly known as: ZOCOR Take 1 tablet (40 mg total) by mouth daily.   triamcinolone 55 MCG/ACT Aero nasal inhaler Commonly known as: NASACORT Place 2 sprays into the nose daily. What changed:  when to take this reasons to take this               Durable Medical Equipment  (From admission, onward)           Start     Ordered   05/05/22 1158  DME 3 n 1  Once        05/05/22 1157   05/05/22 1158  DME Walker rolling  Once       Question Answer Comment  Walker: With 5 Inch Wheels   Patient needs a walker to treat with the following condition Status post left hip replacement      05/05/22 1157            Diagnostic Studies: DG Pelvis Portable  Result Date: 05/05/2022 CLINICAL DATA:  Postop left hip replacement. EXAM: PORTABLE PELVIS 1-2 VIEWS COMPARISON:  02/08/2022. FINDINGS: Status post left total hip replacement. No evidence for hardware complications. Gas in the overlying soft tissues compatible with the immediate postoperative state. IMPRESSION: Status post left total hip replacement. No evidence for immediate hardware complication. Electronically Signed   By: Misty Stanley M.D.   On: 05/05/2022 10:48   DG HIP UNILAT WITH PELVIS 1V LEFT  Result Date: 05/05/2022 CLINICAL DATA:  Left hip replacement EXAM: DG HIP (WITH OR WITHOUT PELVIS)  1V*L* COMPARISON:  02/08/2022 FINDINGS: 6 intraoperative images. These demonstrate placement of a left hip arthroplasty, without acute hardware complication or periprosthetic fracture. The right  femoral head is located. IMPRESSION: Intraoperative imaging of left hip arthroplasty. Electronically Signed   By: Abigail Miyamoto M.D.   On: 05/05/2022 09:37   DG C-Arm 1-60 Min-No Report  Result Date: 05/05/2022 Fluoroscopy was utilized by the requesting physician.  No radiographic interpretation.    Disposition: Discharge disposition: 01-Home or [place], [place] Follow up.   Specialty: [place] Why: to provide home physical therapy Contact information: [address] [place] [address] [phone]         Mcarthur Rossetti, MD Follow up in 2 week(s).   Specialty: Orthopedic Surgery Contact information: [address] [place] [address] [phone]                  Signed: Mcarthur Rossetti 05/06/2022, 9:52 AM

## 2022-05-06 NOTE — Progress Notes (Signed)
Physical Therapy Treatment Patient Details Name: Jimmy Palmer MRN: [id number] DOB: 11-Jun-1941 Today's Date: 05/06/2022   History of Present Illness Pt is a 81yo male presenting s/p L-THA, AA on 05/01/22. PMH: CKD, HTN, BBB, pacemaker, R-uni knee.    PT Comments    Patient continues to progress well and demonstrated good carryover for safe management of RW with transfers and gait. No LOB noted throughout gait and pt completed stair mobility with safe sequencing. Pt verbalized safe assist level for spouse to provide during mobility. EOS reviewed HEP and questions for safe discharge home. He is safe to return home iwth assist from spouse. Will progress during hospital stay with plan for HHPT follow up.     Recommendations for follow up therapy are one component of a multi-disciplinary discharge planning process, led by the attending physician.  Recommendations may be updated based on patient status, additional functional criteria and insurance authorization.  Follow Up Recommendations  Follow physician's recommendations for discharge plan and follow up therapies     Assistance Recommended at Discharge Intermittent Supervision/Assistance  Patient can return home with the following A little help with walking and/or transfers;A little help with bathing/dressing/bathroom;Assistance with cooking/housework;Assist for transportation;Help with stairs or ramp for entrance   Equipment Recommendations  None recommended by PT (Pt has recommended DME)    Recommendations for Other Services       Precautions / Restrictions Precautions Precautions: Fall Restrictions Weight Bearing Restrictions: No Other Position/Activity Restrictions: WBAT     Mobility  Bed Mobility Overal bed mobility: Needs Assistance Bed Mobility: Supine to Sit     Supine to sit: Min guard     General bed mobility comments: Pt relied on VC's for repositioning of hand placement. Pt requested Lt leg lift assist from  therapist.    Transfers Overall transfer level: Needs assistance Equipment used: Rolling walker (2 wheels) Transfers: Sit to/from Stand Sit to Stand: Min guard           General transfer comment: pt power up wtih bil UE, steady with rise. guard for safety.    Ambulation/Gait Ambulation/Gait assistance: Min guard Gait Distance (Feet): 180 Feet Assistive device: Rolling walker (2 wheels) Gait Pattern/deviations: Step-to pattern, Decreased stride length Gait velocity: decreased     General Gait Details: Pt ambulated ~180 ft with RW and Min guard. no LOB noted and pt maintained safe proximity to RW. completed knee flexion in standing to stretch and improved step length.   Stairs Stairs: Yes Stairs assistance: Min guard Stair Management: One rail Right, Step to pattern, Forwards Number of Stairs: 3 General stair comments: VC for safe sequencing "up with good, down with bad". no LOB noted and no buckling. pt able to verablzie safe guarding/assist for spouse to provide.   Wheelchair Mobility    Modified Rankin (Stroke Patients Only)       Balance Overall balance assessment: Needs assistance Sitting-balance support: Feet supported, No upper extremity supported       Standing balance support: Reliant on assistive device for balance, During functional activity, Bilateral upper extremity supported Standing balance-Leahy Scale: Fair                              Cognition Arousal/Alertness: Awake/alert Behavior During Therapy: WFL for tasks assessed/performed Overall Cognitive Status: Within Functional Limits for tasks assessed  Exercises Total Joint Exercises Ankle Circles/Pumps: AROM, Both, 20 reps, Seated Heel Slides: Left, 10 reps, Seated, AAROM Hip ABduction/ADduction: Left, Seated, AAROM, 5 reps Long Arc Quad: AROM, Left, 10 reps    General Comments        Pertinent Vitals/Pain Pain  Assessment Pain Assessment: 0-10 Pain Score: 5  Pain Location: left hip Pain Descriptors / Indicators: Operative site guarding, Discomfort Pain Intervention(s): Monitored during session, Limited activity within patient's tolerance, Repositioned, Ice applied, Premedicated before session    Home Living                          Prior Function            PT Goals (current goals can now be found in the care plan section) Acute Rehab PT Goals Patient Stated Goal: To resume activie lifestyle PT Goal Formulation: With patient Time For Goal Achievement: 05/12/22 Potential to Achieve Goals: Good Progress towards PT goals: Progressing toward goals    Frequency    7X/week      PT Plan Current plan remains appropriate    Co-evaluation              AM-PAC PT "6 Clicks" Mobility   Outcome Measure  Help needed turning from your back to your side while in a flat bed without using bedrails?: None Help needed moving from lying on your back to sitting on the side of a flat bed without using bedrails?: None Help needed moving to and from a bed to a chair (including a wheelchair)?: A Little Help needed standing up from a chair using your arms (e.g., wheelchair or bedside chair)?: A Little Help needed to walk in hospital room?: A Little Help needed climbing 3-5 steps with a railing? : A Little 6 Click Score: 20    End of Session Equipment Utilized During Treatment: Gait belt Activity Tolerance: Patient tolerated treatment well;No increased pain Patient left: in chair;with call bell/phone within reach;with chair alarm set;with SCD's reapplied Nurse Communication: Mobility status PT Visit Diagnosis: Difficulty in walking, not elsewhere classified (R26.2);Pain;Unsteadiness on feet (R26.81) Pain - Right/Left: Left Pain - part of body: Hip     Time: [id number] PT Time Calculation (min) (ACUTE ONLY): 23 min  Charges:  $Gait Training: 8-22 mins $Therapeutic Exercise: 8-22  mins                     Verner Mould, DPT Acute Rehabilitation Services Office [phone] Pager [phone]  05/06/22 2:00 PM

## 2022-05-06 NOTE — Plan of Care (Signed)
  Problem: Activity: Goal: Ability to avoid complications of mobility impairment will improve Outcome: Progressing Goal: Ability to tolerate increased activity will improve Outcome: Progressing   Problem: Clinical Measurements: Goal: Postoperative complications will be avoided or minimized Outcome: Progressing   Problem: Pain Management: Goal: Pain level will decrease with appropriate interventions Outcome: Progressing   

## 2022-05-06 NOTE — Progress Notes (Signed)
Physical Therapy Treatment Patient Details Name: Jimmy Palmer MRN: [id number] DOB: 04-04-1941 Today's Date: 05/06/2022   History of Present Illness Pt is a 81yo male presenting s/p L-THA, AA on 05/01/22. PMH: CKD, HTN, BBB, pacemaker, R-uni knee.    PT Comments    Pt is progressing well with therapy. He ambulated 160 ft in the hallway and completed HEP. He requires supervision for bed mobility and Min guard for transfers and gait. Recommending continued skilled therapy until pt completes stairs and is safe to return home.   Recommendations for follow up therapy are one component of a multi-disciplinary discharge planning process, led by the attending physician.  Recommendations may be updated based on patient status, additional functional criteria and insurance authorization.  Follow Up Recommendations  Follow physician's recommendations for discharge plan and follow up therapies     Assistance Recommended at Discharge Intermittent Supervision/Assistance  Patient can return home with the following A little help with walking and/or transfers;A little help with bathing/dressing/bathroom;Assistance with cooking/housework;Assist for transportation;Help with stairs or ramp for entrance   Equipment Recommendations  None recommended by PT (Pt has recommended DME)    Recommendations for Other Services       Precautions / Restrictions Precautions Precautions: Fall Restrictions Weight Bearing Restrictions: No Other Position/Activity Restrictions: WBAT     Mobility  Bed Mobility Overal bed mobility: Needs Assistance Bed Mobility: Supine to Sit     Supine to sit: Min guard     General bed mobility comments: Pt relied on VC's for repositioning of hand placement. Pt requested Lt leg lift assist from therapist.    Transfers Overall transfer level: Needs assistance Equipment used: Rolling walker (2 wheels) Transfers: Sit to/from Stand Sit to Stand: Min guard           General  transfer comment: Pt relied on RW for standing and balance support.    Ambulation/Gait Ambulation/Gait assistance: Min guard Gait Distance (Feet): 160 Feet Assistive device: Rolling walker (2 wheels) Gait Pattern/deviations: Step-to pattern, Decreased stride length Gait velocity: decreased     General Gait Details: Pt ambulated 160 ft in the hallway with RW and Min guard assist. Pt periodically stopped gait to stand for several moments before gait continued.   Stairs             Wheelchair Mobility    Modified Rankin (Stroke Patients Only)       Balance Overall balance assessment: Needs assistance Sitting-balance support: Feet supported, No upper extremity supported       Standing balance support: Reliant on assistive device for balance, During functional activity, Bilateral upper extremity supported Standing balance-Leahy Scale: Fair                              Cognition Arousal/Alertness: Awake/alert Behavior During Therapy: WFL for tasks assessed/performed Overall Cognitive Status: Within Functional Limits for tasks assessed                                          Exercises Total Joint Exercises Ankle Circles/Pumps: AROM, Both, 20 reps, Seated Quad Sets: 10 reps, Left, AROM, Strengthening, Seated Heel Slides: AROM, Left, 10 reps, Seated    General Comments        Pertinent Vitals/Pain Pain Assessment Pain Assessment: 0-10 Pain Location: left hip Pain Descriptors / Indicators: Operative site guarding, Discomfort Pain  Intervention(s): Limited activity within patient's tolerance, Monitored during session, Premedicated before session, Repositioned, Ice applied    Home Living                          Prior Function            PT Goals (current goals can now be found in the care plan section) Acute Rehab PT Goals Patient Stated Goal: To resume activie lifestyle PT Goal Formulation: With patient Time For  Goal Achievement: 05/12/22 Potential to Achieve Goals: Good Progress towards PT goals: Progressing toward goals    Frequency    7X/week      PT Plan Current plan remains appropriate    Co-evaluation              AM-PAC PT "6 Clicks" Mobility   Outcome Measure  Help needed turning from your back to your side while in a flat bed without using bedrails?: None Help needed moving from lying on your back to sitting on the side of a flat bed without using bedrails?: None Help needed moving to and from a bed to a chair (including a wheelchair)?: A Little Help needed standing up from a chair using your arms (e.g., wheelchair or bedside chair)?: A Little Help needed to walk in hospital room?: A Little Help needed climbing 3-5 steps with a railing? : A Little 6 Click Score: 20    End of Session Equipment Utilized During Treatment: Gait belt Activity Tolerance: Patient tolerated treatment well;No increased pain Patient left: in chair;with call bell/phone within reach;with chair alarm set;with SCD's reapplied Nurse Communication: Mobility status PT Visit Diagnosis: Difficulty in walking, not elsewhere classified (R26.2);Pain;Unsteadiness on feet (R26.81) Pain - Right/Left: Left Pain - part of body: Hip     Time:  -     Charges:                        Lenn Cal, SPT Acute Rehab [facility] 05/06/2022, 11:03 AM

## 2022-05-06 NOTE — Plan of Care (Signed)

## 2022-05-06 NOTE — Progress Notes (Signed)
Subjective: 1 Day Post-Op Procedure(s) (LRB): LEFT TOTAL HIP ARTHROPLASTY ANTERIOR APPROACH (Left) Patient reports pain as moderate.  Working well with therapy and his mobility.  Objective: Vital signs in last 24 hours: Temp:  [97.5 F (36.4 C)-98.9 F (37.2 C)] 98.5 F (36.9 C) (06/10 0537) Pulse Rate:  [60-90] 90 (06/10 0537) Resp:  [13-20] 20 (06/10 0537) BP: (74-144)/(49-91) 134/65 (06/10 0537) SpO2:  [94 %-100 %] 95 % (06/10 0537) Weight:  [80 kg] 80 kg (06/09 1200)  Intake/Output from previous day: 06/09 0701 - 06/10 0700 In: 3237.2 [P.O.:480; I.V.:2607.2; IV Piggyback:150] Out: 1150 [Urine:1100; Blood:50] Intake/Output this shift: No intake/output data recorded.  Recent Labs    05/06/22 0311  HGB 10.6*   Recent Labs    05/06/22 0311  WBC 16.1*  RBC 3.41*  HCT 32.1*  PLT 168   Recent Labs    05/06/22 0311  NA 135  K 4.2  CL 103  CO2 25  BUN 17  CREATININE 1.22  GLUCOSE 132*  CALCIUM 8.6*   No results for input(s): "LABPT", "INR" in the last 72 hours.  Sensation intact distally Intact pulses distally Dorsiflexion/Plantar flexion intact Incision: dressing C/D/I   Assessment/Plan: 1 Day Post-Op Procedure(s) (LRB): LEFT TOTAL HIP ARTHROPLASTY ANTERIOR APPROACH (Left) Up with therapy Discharge home with home health      Kathryne Hitch 05/06/2022, 9:32 AM

## 2022-05-06 NOTE — Discharge Instructions (Signed)

## 2022-05-08 ENCOUNTER — Encounter (HOSPITAL_COMMUNITY): Payer: Self-pay | Admitting: Orthopaedic Surgery

## 2022-05-08 ENCOUNTER — Ambulatory Visit (HOSPITAL_BASED_OUTPATIENT_CLINIC_OR_DEPARTMENT_OTHER): Payer: 59 | Admitting: Family Medicine

## 2022-05-08 ENCOUNTER — Telehealth: Payer: Self-pay | Admitting: Orthopaedic Surgery

## 2022-05-08 NOTE — Telephone Encounter (Signed)
Order sent for DME through [facility]

## 2022-05-08 NOTE — Telephone Encounter (Signed)
Received call from [facility] with [facility] needing Rx for a 3 in 1 commode sent to a preferred DME. The number to contact Byrd Hesselbach is [phone]

## 2022-05-11 ENCOUNTER — Telehealth: Payer: Self-pay | Admitting: Orthopaedic Surgery

## 2022-05-11 NOTE — Telephone Encounter (Signed)
Jimmy Palmer (PT) from [facility] called requesting to call pt. Pt has been experiencing nonstop hiccups and asking for medication to stop or advice. Pt is becoming nausea due to nonstop hiccups. Also wife is asking to come keep extra bandages up to change out bandage. Please call pt at [phone].

## 2022-05-12 ENCOUNTER — Other Ambulatory Visit: Payer: Self-pay | Admitting: Orthopaedic Surgery

## 2022-05-12 ENCOUNTER — Telehealth: Payer: Self-pay | Admitting: *Deleted

## 2022-05-12 MED ORDER — BACLOFEN 10 MG PO TABS: 10.0000 mg | ORAL_TABLET | Freq: Three times a day (TID) | ORAL | 0 refills | Status: DC | PRN

## 2022-05-12 NOTE — Telephone Encounter (Signed)
Spoke with patient and his wife, who is a Engineer, civil (consulting), today to help with a few concerns they are having. His hiccups have been persistent and non-stop since coming home from hospital. He cannot complete a full sentence. He has stopped his Oxycodone and is only taking Tylenol now due to constipation (resolved) and some personality changes with this. He thinks he has developed a cold after being in hospital and sounds congested along with coughing/spitting up white, frothy sputum. Encouraged continued use of Incentive spirometer along with fluids to help with cold symptoms. He has used some OTC sinus /cold medication as well, which has helped some. I will reach out to his PCP office and let them know that we cannot address cold/sinus or general issues related to this and he may need to be seen to address with them. His dressing is doing well, but I did tell them that I have an Aquacel here I can give them if they would like to change it. Thank you.

## 2022-05-12 NOTE — Telephone Encounter (Signed)
Call to Dr. Tommi Rumps [place]'s office and left voice message with medical assistant informing of cold/congestion symptoms since being home from hospital, and that we would treat anything related to Orthopedics here at [facility]. Wife would like patient seen by PCP next week if possible.

## 2022-05-12 NOTE — Telephone Encounter (Signed)
I called and talked to the pt. He stated his pcp couldn't do anything for him for 90 days. He is coughing up white pneum and had non stop hiccups. Can you advise on who pt needs to contact about this? Anesthesia ?

## 2022-05-18 ENCOUNTER — Ambulatory Visit (INDEPENDENT_AMBULATORY_CARE_PROVIDER_SITE_OTHER): Payer: Medicare Other | Admitting: Orthopaedic Surgery

## 2022-05-18 ENCOUNTER — Other Ambulatory Visit: Payer: Self-pay | Admitting: *Deleted

## 2022-05-18 ENCOUNTER — Encounter: Payer: Self-pay | Admitting: Orthopaedic Surgery

## 2022-05-18 DIAGNOSIS — Z96642 Presence of left artificial hip joint: Secondary | ICD-10-CM

## 2022-05-18 NOTE — Progress Notes (Signed)
Mr. Parchman is a 82 year old gentleman who is here for his first visit status post a left total hip arthroplasty.  He is on Eliquis which is a chronic medication for him.  He has already transition from a walker to a cane and is interested in outpatient physical therapy as well.  His left hip incision looks good.  The staples have been removed and Steri-Strips applied.  There is moderate swelling from being on Eliquis but overall he does look good.  His leg lengths appear equal.  We can see about transitioning him to outpatient physical therapy.  I have told him not to overdo things and I do feel that he is making good progress.  I would like to see him back in 4 weeks to see how he is doing overall but no x-rays are needed.  He is already off of pain medicine as well.

## 2022-05-19 ENCOUNTER — Other Ambulatory Visit: Payer: Self-pay | Admitting: Orthopaedic Surgery

## 2022-05-25 ENCOUNTER — Encounter (HOSPITAL_BASED_OUTPATIENT_CLINIC_OR_DEPARTMENT_OTHER): Payer: Self-pay | Admitting: Physical Therapy

## 2022-05-25 ENCOUNTER — Ambulatory Visit (HOSPITAL_BASED_OUTPATIENT_CLINIC_OR_DEPARTMENT_OTHER): Payer: Medicare Other | Attending: Orthopaedic Surgery | Admitting: Physical Therapy

## 2022-05-25 DIAGNOSIS — M25552 Pain in left hip: Secondary | ICD-10-CM | POA: Diagnosis present

## 2022-05-25 DIAGNOSIS — Z96642 Presence of left artificial hip joint: Secondary | ICD-10-CM | POA: Insufficient documentation

## 2022-05-25 DIAGNOSIS — M25652 Stiffness of left hip, not elsewhere classified: Secondary | ICD-10-CM | POA: Insufficient documentation

## 2022-05-25 DIAGNOSIS — R2689 Other abnormalities of gait and mobility: Secondary | ICD-10-CM | POA: Insufficient documentation

## 2022-05-25 NOTE — Therapy (Addendum)
OUTPATIENT PHYSICAL THERAPY LOWER EXTREMITY EVALUATION   Patient Name: Jimmy Palmer MRN: [id number] DOB:[birth year], 81 y.o., male Today's Date: 05/25/2022   PT End of Session - 05/25/22 1611     Visit Number 1    Number of Visits 16    Date for PT Re-Evaluation 07/20/22    PT Start Time [id number]    PT Stop Time 1517    PT Time Calculation (min) 44 min    Activity Tolerance Patient tolerated treatment well;No increased pain    Behavior During Therapy [facility] for tasks assessed/performed             Past Medical History:  Diagnosis Date   Abnormal electrocardiogram    Abnormal heart sounds    Allergy 2020   upon arrival to [facility]   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   Chronic kidney disease 11/07/2021   Depression 2020   receiving appropriate therapy   Herniated lumbar intervertebral disc    Hypertension    Keratosis    benign   Left bundle branch block    Pacemaker    Pure hypercholesterolemia    Sick sinus syndrome (HCC)    Sleep apnea    VT (ventricular tachycardia) ([place])    Past Surgical History:  Procedure Laterality Date   CARDIOVERSION N/A 09/28/2020   Procedure: CARDIOVERSION;  Surgeon: Nahser, Wonda Cheng, MD;  Location: [place];  Service: Cardiovascular;  Laterality: N/A;   INSERT / REPLACE / REMOVE PACEMAKER     JOINT REPLACEMENT     partial right knee, medial side   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   Procedure: LEFT TOTAL HIP ARTHROPLASTY ANTERIOR APPROACH;  Surgeon: Mcarthur Rossetti, MD;  Location: [facility];  Service: Orthopedics;  Laterality: Left;   VASECTOMY     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   Rectal bleeding 02/01/2022   Left hip pain 02/01/2022   Environmental and seasonal allergies 02/01/2022   Hypertension 11/07/2021   Chronic kidney disease 11/07/2021   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   Atrial tachycardia  ([place]) 08/26/2020   Pacemaker - MDT 08/26/2020   Persistent atrial fibrillation ([place]) 12/22/2019    PCP: [name], [name], MD  REFERRING PROVIDER: [name], [name], MD  REFERRING DIAG: [phone] (ICD-10-CM) - Status post total replacement of left hip  THERAPY DIAG:  Status post total replacement of left hip  Pain in left hip  Stiffness of left hip, not elsewhere classified  Other abnormalities of gait and mobility  Rationale for Evaluation and Treatment Rehabilitation  ONSET DATE: May 05, 2022  SUBJECTIVE:   SUBJECTIVE STATEMENT:   PERTINENT HISTORY: CKD, HTN, Pacemaker, A-fib  PAIN:  Are you having pain? Yes: NPRS scale: 5/10 Pain location: Generalized hip Pain description: Persistent ache anterolateral hip, posterior spasms Aggravating factors: Prolonged positioning, bending, lifting, stairs Relieving factors: Rest, Tylenol, heat  PRECAUTIONS: Anterior hip  WEIGHT BEARING RESTRICTIONS No  FALLS:  Has patient fallen in last 6 months? No  LIVING ENVIRONMENT: Lives with: lives with their spouse Lives in: House/apartment Stairs: Yes: External: 3 steps; on right going up Has following equipment at home: Single point cane - since surgery  OCCUPATION: Retired  PLOF: Independent  PATIENT GOALS  Wants to get out for yard work and community activity.   OBJECTIVE:   DIAGNOSTIC FINDINGS: nothing post-op  PATIENT SURVEYS:  FOTO 43% ability  68% expected in 13 visits    COGNITION:  Overall cognitive status: Within functional limits for tasks assessed     SENSATION: WFL  EDEMA:   No edema noted.  POSTURE: No Significant postural limitations   PALPATION: Mild scar tissue around the scar.   LOWER EXTREMITY ROM:  Passive ROM Right eval Left eval  Hip flexion  115 no pain   Hip extension    Hip abduction    Hip adduction    Hip internal rotation  Not pushed to end range but pain free   Hip external rotation  Not pushed to end range but pain  free  Knee flexion    Knee extension    Ankle dorsiflexion    Ankle plantarflexion    Ankle inversion    Ankle eversion     (Blank rows = not tested)  LOWER EXTREMITY MMT:  MMT Right eval Left eval  Hip flexion 36 25  Hip extension    Hip abduction 60.5 38.5  Hip adduction 40.8 49.8  Hip internal rotation    Hip external rotation    Knee flexion    Knee extension    Ankle dorsiflexion    Ankle plantarflexion    Ankle inversion    Ankle eversion     (Blank rows = not tested)   GAIT:    TODAY'S TREATMENT: Access Code: [email or web address] URL: [email or web address] Date: 05/25/2022 Prepared by: Lorayne Bender  Exercises - Supine Bridge  - 1 x daily - 7 x weekly - 3 sets - 10 reps - Seated Long Arc Quad  - 1 x daily - 7 x weekly - 3 sets - 10 reps - Standing March with Counter Support  - 1 x daily - 7 x weekly - 3 sets - 10 reps - Supine Heel Slide with Strap  - 1 x daily - 7 x weekly - 3 sets - 10 reps  Reviewed HEP that he has already.    PATIENT EDUCATION:  Education details:  Person educated: Patient Education method: Explanation Education comprehension: verbalized understanding   HOME EXERCISE PROGRAM: Access Code: [email or web address] URL: [email or web address] Date: 05/25/2022 Prepared by: Lorayne Bender  Exercises - Supine Bridge  - 1 x daily - 7 x weekly - 3 sets - 10 reps - Seated Long Arc Quad  - 1 x daily - 7 x weekly - 3 sets - 10 reps - Standing March with Counter Support  - 1 x daily - 7 x weekly - 3 sets - 10 reps - Supine Heel Slide with Strap  - 1 x daily - 7 x weekly - 3 sets - 10 reps  ASSESSMENT:  CLINICAL IMPRESSION: Patient is a 81 y.o. M who was seen today for physical therapy evaluation and treatment following L THA. He presents with expected limitations in strength, motion and functional mobility. His range is only slightly limited. His strength is very good for 4 weeks post-op. Therapy updated his HEP. He would benefit  from skilled therapy to improve ability to perform steps and get back to yard work.    OBJECTIVE IMPAIRMENTS decreased activity tolerance, decreased balance, decreased endurance, difficulty walking, decreased ROM, decreased strength, and increased muscle spasms.   ACTIVITY LIMITATIONS lifting, bending, squatting, and stairs  PARTICIPATION LIMITATIONS: occupation and yard work  PERSONAL FACTORS Age, Past/current experiences, and 3+ comorbidities: pacemaker, uka right side, recent sciatica   are also affecting patient's functional outcome.   REHAB POTENTIAL: Good  CLINICAL DECISION  MAKING: Evolving/moderate complexity  EVALUATION COMPLEXITY: Moderate   GOALS: Goals reviewed with patient? Yes  SHORT TERM GOALS: Target date: 06/22/2022  Patient will increase gross strength of left hip by 5 lbs  Baseline: Goal status: INITIAL  2.  Patient will demonstrate full pain free AROM of the left hip  Baseline:  Goal status: INITIAL  3.  Patient will be independent with base exercise program  Baseline:  Goal status: INITIAL  4.  Patient will ambulate 300' without an AD  Baseline:  Goal status: INITIAL  LONG TERM GOALS: Target date: 07/20/2022   Patient will go up and down steps without increased pain  Baseline:  Goal status: INITIAL  2.  Patient will perform safe yard work tasks without pain  Baseline:  Goal status: INITIAL  3.  Patient will walk community distances without increased pain  Baseline:  Goal status: INITIAL   PLAN: PT FREQUENCY: 2x/week  PT DURATION: 8 weeks  PLANNED INTERVENTIONS: Therapeutic exercises, Therapeutic activity, Neuromuscular re-education, Balance training, Gait training, Patient/Family education, Joint mobilization, Stair training, Aquatic Therapy, Dry Needling, Spinal mobilization, Cryotherapy, Moist heat, scar mobilization, and Ionotophoresis 4mg /ml Dexamethasone  PLAN FOR NEXT SESSION: Progress supine and standing exercises. Scar tissue  release to anterior hip if needed. Likely will not need PROM as his hip flexion is near full but monitor as we increase his strengthening. Progress to squats and steps as able.    , PT 05/25/2022, 4:13 PM  05/27/2022 SPT   During this treatment session, the therapist was present, participating in and directing the treatment.

## 2022-05-25 NOTE — Addendum Note (Signed)
Addended by: Dessie Coma on: 05/25/2022 04:14 PM   Modules accepted: Orders

## 2022-05-26 ENCOUNTER — Other Ambulatory Visit: Payer: Self-pay | Admitting: Orthopaedic Surgery

## 2022-06-01 ENCOUNTER — Ambulatory Visit (HOSPITAL_BASED_OUTPATIENT_CLINIC_OR_DEPARTMENT_OTHER): Payer: Medicare Other | Admitting: Family Medicine

## 2022-06-07 ENCOUNTER — Ambulatory Visit (HOSPITAL_BASED_OUTPATIENT_CLINIC_OR_DEPARTMENT_OTHER): Payer: Medicare Other | Attending: Orthopaedic Surgery | Admitting: Physical Therapy

## 2022-06-07 ENCOUNTER — Other Ambulatory Visit (HOSPITAL_BASED_OUTPATIENT_CLINIC_OR_DEPARTMENT_OTHER): Payer: Self-pay

## 2022-06-07 ENCOUNTER — Encounter (HOSPITAL_BASED_OUTPATIENT_CLINIC_OR_DEPARTMENT_OTHER): Payer: Self-pay | Admitting: Physical Therapy

## 2022-06-07 ENCOUNTER — Ambulatory Visit (INDEPENDENT_AMBULATORY_CARE_PROVIDER_SITE_OTHER): Payer: Medicare Other | Admitting: Orthopaedic Surgery

## 2022-06-07 DIAGNOSIS — Z96642 Presence of left artificial hip joint: Secondary | ICD-10-CM

## 2022-06-07 DIAGNOSIS — M25652 Stiffness of left hip, not elsewhere classified: Secondary | ICD-10-CM | POA: Insufficient documentation

## 2022-06-07 DIAGNOSIS — M25552 Pain in left hip: Secondary | ICD-10-CM | POA: Diagnosis present

## 2022-06-07 DIAGNOSIS — R2689 Other abnormalities of gait and mobility: Secondary | ICD-10-CM | POA: Insufficient documentation

## 2022-06-07 MED ORDER — METHOCARBAMOL 500 MG PO TABS
500.0000 mg | ORAL_TABLET | Freq: Four times a day (QID) | ORAL | 0 refills | Status: AC
  Filled 2022-06-07: qty 56, 14d supply, fill #0

## 2022-06-07 NOTE — Therapy (Signed)
OUTPATIENT PHYSICAL THERAPY LOWER EXTREMITY TREATMENT   Patient Name: Jimmy Palmer MRN: [id number] DOB:1941/05/23, 81 y.o., male Today's Date: 06/07/2022   PT End of Session - 06/07/22 1340     Visit Number 2    Number of Visits 16    Date for PT Re-Evaluation 07/20/22    PT Start Time 1300    PT Stop Time 1343    PT Time Calculation (min) 43 min    Activity Tolerance Patient tolerated treatment well;No increased pain    Behavior During Therapy [facility] for tasks assessed/performed              Past Medical History:  Diagnosis Date   Abnormal electrocardiogram    Abnormal heart sounds    Allergy 2020   upon arrival to [facility]   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   Chronic kidney disease 11/07/2021   Depression 2020   receiving appropriate therapy   Herniated lumbar intervertebral disc    Hypertension    Keratosis    benign   Left bundle branch block    Pacemaker    Pure hypercholesterolemia    Sick sinus syndrome (HCC)    Sleep apnea    VT (ventricular tachycardia) (HCC)    Past Surgical History:  Procedure Laterality Date   CARDIOVERSION N/A 09/28/2020   Procedure: CARDIOVERSION;  Surgeon: Nahser, Deloris Ping, MD;  [facility]: [facility];  Service: Cardiovascular;  Laterality: N/A;   INSERT / REPLACE / REMOVE PACEMAKER     JOINT REPLACEMENT     partial right knee, medial side   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   Procedure: LEFT TOTAL HIP ARTHROPLASTY ANTERIOR APPROACH;  Surgeon: Kathryne Hitch, MD;  [facility];  Service: Orthopedics;  Laterality: Left;   VASECTOMY     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   Rectal bleeding 02/01/2022   Left hip pain 02/01/2022   Environmental and seasonal allergies 02/01/2022   Hypertension 11/07/2021   Chronic kidney disease 11/07/2021   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   Atrial  tachycardia (HCC) 08/26/2020   Pacemaker - MDT 08/26/2020   Persistent atrial fibrillation (HCC) 12/22/2019    PCP: [place], [name], MD  REFERRING PROVIDER: [place], [name], MD  REFERRING DIAG: [phone] (ICD-10-CM) - Status post total replacement of left hip  THERAPY DIAG:  Pain in left hip  Stiffness of left hip, not elsewhere classified  Status post total replacement of left hip  Other abnormalities of gait and mobility  Rationale for Evaluation and Treatment Rehabilitation  ONSET DATE: May 05, 2022  SUBJECTIVE:   SUBJECTIVE STATEMENT: Patient reports his hip has been feeling better overall. He has been increasing his walking and activity. He feels he is having muscle spasms and stiffness in bil posterior hips. He will see the MD tonight.   PERTINENT HISTORY: CKD, HTN, Pacemaker, A-fib  PAIN:  Are you having pain? Yes: NPRS scale: 5/10 Pain location: Generalized hip Pain description: Persistent ache anterolateral hip, posterior spasms Aggravating factors: Prolonged positioning, bending, lifting, stairs Relieving factors: Rest, Tylenol, heat  PRECAUTIONS: Anterior hip  WEIGHT BEARING RESTRICTIONS No  FALLS:  Has patient fallen in last 6 months? No  LIVING ENVIRONMENT: Lives with: lives with their spouse Lives in: House/apartment Stairs: Yes: External: 3 steps; on right going up Has following equipment  at home: Single point cane - since surgery  OCCUPATION: Retired  PLOF: Independent  PATIENT GOALS  Wants to get out for yard work and community activity.   OBJECTIVE:   DIAGNOSTIC FINDINGS: nothing post-op   PATIENT SURVEYS:  FOTO 43% ability  68% expected in 13 visits    COGNITION:  Overall cognitive status: Within functional limits for tasks assessed     SENSATION: WFL  EDEMA:   No edema noted.  POSTURE: No Significant postural limitations   PALPATION: Mild scar tissue around the scar.   LOWER EXTREMITY ROM:  Passive ROM  Right eval Left eval  Hip flexion  115 no pain   Hip extension    Hip abduction    Hip adduction    Hip internal rotation  Not pushed to end range but pain free   Hip external rotation  Not pushed to end range but pain free  Knee flexion    Knee extension    Ankle dorsiflexion    Ankle plantarflexion    Ankle inversion    Ankle eversion     (Blank rows = not tested)  LOWER EXTREMITY MMT:  MMT Right eval Left eval  Hip flexion 36 25  Hip extension    Hip abduction 60.5 38.5  Hip adduction 40.8 49.8  Hip internal rotation    Hip external rotation    Knee flexion    Knee extension    Ankle dorsiflexion    Ankle plantarflexion    Ankle inversion    Ankle eversion     (Blank rows = not tested)    TODAY'S TREATMENT:  7/12 Nu-Step, 5 min L1 Manual PROM - all planes to gentle end ranges Trigger point palpation and release - bil glute med Mini squats with UE support - 3x10 Side-stepping - 3 laps Semi-tandem weight shifting, 2x30sec bil    PATIENT EDUCATION:  Education details:  Person educated: Patient Education method: Explanation Education comprehension: verbalized understanding   HOME EXERCISE PROGRAM: Access Code: [email or web address] URL: [email or web address] Date: 05/25/2022 Prepared by: Lorayne Bender  Exercises - Supine Bridge  - 1 x daily - 7 x weekly - 3 sets - 10 reps - Seated Long Arc Quad  - 1 x daily - 7 x weekly - 3 sets - 10 reps - Standing March with Counter Support  - 1 x daily - 7 x weekly - 3 sets - 10 reps - Supine Heel Slide with Strap  - 1 x daily - 7 x weekly - 3 sets - 10 reps  ASSESSMENT:  CLINICAL IMPRESSION: Patient presents today and states he has made improvements with function and mobility. He reports occasional mild joint pain in his L hip, his main concern is bil glute med tenderness - worse in the morning but resolves with heat and movement. He tolerates exercises very well today. He demonstrates good general ROM with  mini-squats and is able to show good posture and lateral stability with cueing. He will benefit from skilled therapy to continue to progress function and return pt to activities.   OBJECTIVE IMPAIRMENTS decreased activity tolerance, decreased balance, decreased endurance, difficulty walking, decreased ROM, decreased strength, and increased muscle spasms.   ACTIVITY LIMITATIONS lifting, bending, squatting, and stairs  PARTICIPATION LIMITATIONS: occupation and yard work  PERSONAL FACTORS Age, Past/current experiences, and 3+ comorbidities: pacemaker, uka right side, recent sciatica   are also affecting patient's functional outcome.   REHAB POTENTIAL: Good  CLINICAL DECISION MAKING: Evolving/moderate complexity  EVALUATION COMPLEXITY: Moderate  GOALS: Goals reviewed with patient? Yes  SHORT TERM GOALS: Target date: 06/22/2022  Patient will increase gross strength of left hip by 5 lbs  Baseline: Goal status: INITIAL  2.  Patient will demonstrate full pain free AROM of the left hip  Baseline:  Goal status: INITIAL  3.  Patient will be independent with base exercise program  Baseline:  Goal status: INITIAL  4.  Patient will ambulate 300' without an AD  Baseline:  Goal status: INITIAL  LONG TERM GOALS: Target date: 07/20/2022   Patient will go up and down steps without increased pain  Baseline:  Goal status: INITIAL  2.  Patient will perform safe yard work tasks without pain  Baseline:  Goal status: INITIAL  3.  Patient will walk community distances without increased pain  Baseline:  Goal status: INITIAL   PLAN: PT FREQUENCY: 2x/week  PT DURATION: 8 weeks  PLANNED INTERVENTIONS: Therapeutic exercises, Therapeutic activity, Neuromuscular re-education, Balance training, Gait training, Patient/Family education, Joint mobilization, Stair training, Aquatic Therapy, Dry Needling, Spinal mobilization, Cryotherapy, Moist heat, scar mobilization, and Ionotophoresis 4mg /ml  Dexamethasone  PLAN FOR NEXT SESSION: Progress exercises as tolerated - functional activity and weight shifting. Try SLS and step-to/step ups. STM as indicated.   , PT 06/07/2022, 4:11 PM  08/08/2022 SPT   During this treatment session, the therapist was present, participating in and directing the treatment.

## 2022-06-07 NOTE — Progress Notes (Signed)
Chief Complaint: Lower back pain     History of Present Illness:    Jimmy Palmer is a 81 y.o. male presents today 1 month status post left total hip arthroplasty with Dr. Magnus Ivan presents with lower back pain as well as bilateral SI pain.  He states that he is taking baclofen although he is having some side effects from this so he is discontinued.  He is here today for further assessment.  Overall very happy and satisfied with the quality of his total hip outcome.    Surgical History:   Left total hip arthroplasty June 2023  PMH/PSH/Family History/Social History/Meds/Allergies:    Past Medical History:  Diagnosis Date   Abnormal electrocardiogram    Abnormal heart sounds    Allergy 2020   upon arrival to [facility]   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   Chronic kidney disease 11/07/2021   Depression 2020   receiving appropriate therapy   Herniated lumbar intervertebral disc    Hypertension    Keratosis    benign   Left bundle branch block    Pacemaker    Pure hypercholesterolemia    Sick sinus syndrome (HCC)    Sleep apnea    VT (ventricular tachycardia) (HCC)    Past Surgical History:  Procedure Laterality Date   CARDIOVERSION N/A 09/28/2020   Procedure: CARDIOVERSION;  Surgeon: Nahser, Deloris Ping, MD;  [facility]: [facility];  Service: Cardiovascular;  Laterality: N/A;   INSERT / REPLACE / REMOVE PACEMAKER     JOINT REPLACEMENT     partial right knee, medial side   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   Procedure: LEFT TOTAL HIP ARTHROPLASTY ANTERIOR APPROACH;  Surgeon: Kathryne Hitch, MD;  [facility]: [facility];  Service: Orthopedics;  Laterality: Left;   VASECTOMY     Social History   Socioeconomic History   Marital status: Married    Spouse name: Not on file   Number of children: 2   Years of education: Not on file   Highest education level: Not on file  Occupational History   Not on file  Tobacco Use   Smoking  status: Former    Types: Cigars   Smokeless tobacco: Never   Tobacco comments:    Smoked cigars until the early 2000's  Vaping Use   Vaping Use: Never used  Substance and Sexual Activity   Alcohol use: Yes    Alcohol/week: 14.0 standard drinks of alcohol    Types: 7 Glasses of wine, 7 Shots of liquor per week    Comment: 1-2 cocktails every day and 1 glass of wine every day   Drug use: Never   Sexual activity: Not Currently  Other Topics Concern   Not on file  Social History Narrative   Retired Chiropractor   Recently relocated from [place]   Social Determinants of Corporate investment banker Strain: Not on file  Food Insecurity: Not on file  Transportation Needs: Not on file  Physical Activity: Not on file  Stress: Not on file  Social Connections: Not on file   Family History  Problem Relation Age of Onset   Arthritis Mother    Hypertension Mother    Hypertension Father    Allergies  Allergen Reactions   Amoxicillin Diarrhea  Current Outpatient Medications  Medication Sig Dispense Refill   methocarbamol (ROBAXIN) 500 MG tablet Take 1 tablet (500 mg total) by mouth 4 (four) times daily for 14 days. 56 tablet 0   apixaban (ELIQUIS) 5 MG TABS tablet Take 1 tablet (5 mg total) by mouth 2 (two) times daily. 180 tablet 1   baclofen (LIORESAL) 10 MG tablet TAKE 1 TABLET(10 MG) BY MOUTH THREE TIMES DAILY AS NEEDED FOR MUSCLE SPASMS 30 tablet 0   buPROPion (WELLBUTRIN SR) 150 MG 12 hr tablet Take 150 mg by mouth daily.     diclofenac Sodium (VOLTAREN) 1 % GEL Apply 1 application topically as needed (pain).      diphenhydrAMINE (BENADRYL) 25 mg capsule Take 25 mg by mouth at bedtime.      doxazosin (CARDURA) 4 MG tablet TAKE 1 TABLET BY MOUTH EVERY DAY 90 tablet 0   losartan-hydrochlorothiazide (HYZAAR) 100-12.5 MG tablet TAKE 1 TABLET BY MOUTH EVERY DAY 90 tablet 1   Melatonin 5 MG TABS Take 5 mg by mouth at bedtime.      Multiple Vitamin (MULTIVITAMIN PO) Take 2 tablets  by mouth once a week.     oxyCODONE (OXY IR/ROXICODONE) 5 MG immediate release tablet Take 1-2 tablets (5-10 mg total) by mouth every 4 (four) hours as needed for moderate pain (pain score 4-6). (Patient not taking: Reported on 05/25/2022) 30 tablet 0   phenylephrine (,USE FOR PREPARATION-H,) 0.25 % suppository Place 1 suppository rectally 2 (two) times daily as needed for hemorrhoids. 12 suppository 1   sertraline (ZOLOFT) 25 MG tablet Take 25 mg by mouth daily.     simvastatin (ZOCOR) 40 MG tablet Take 1 tablet (40 mg total) by mouth daily. 90 tablet 3   triamcinolone (NASACORT) 55 MCG/ACT AERO nasal inhaler Place 2 sprays into the nose daily. (Patient taking differently: Place 2 sprays into the nose daily as needed (allergies).) 1 each 2   No current facility-administered medications for this visit.   No results found.  Review of Systems:   A ROS was performed including pertinent positives and negatives as documented in the HPI.  Physical Exam :   Constitutional: NAD and appears stated age Neurological: Alert and oriented Psych: Appropriate affect and cooperative There were no vitals taken for this visit.   Comprehensive Musculoskeletal Exam:    Tenderness palpation over the lumbar spine paraspinal muscular area as well as bilateral SI joints.  Range of motion about the left hip appears much improved with 30 degrees internal and external rotation.  No pain with this.  Remainder of distal neurosensory exam is intact  Imaging:   Xray (3 views left hip): Status post left hip arthroplasty without complication   I personally reviewed and interpreted the radiographs.   Assessment:   81 y.o. male status post left total hip arthroplasty femoral nerve prior overall doing very well.  He is experiencing some bilateral SI pain and lower back pain.  I described that this is not uncommon in the course of a postoperative rehabilitation from total hip arthroplasty.  We discussed how this may occur  from ergonomic changes with sleeping for example.  At this time given the fact that he is experiencing side effects from baclofen I will plan to transition him to Robaxin.  I have also recommended aquatic therapy to start at 6 weeks per Dr. Eliberto Ivory wound healing guidelines.  Plan :    -He may follow-up with Dr. Magnus Ivan from a total hip perspective  I personally saw and evaluated the patient, and participated in the management and treatment plan.  Vanetta Mulders, MD Attending Physician, Orthopedic Surgery  This document was dictated using Dragon voice recognition software. A reasonable attempt at proof reading has been made to minimize errors.

## 2022-06-08 ENCOUNTER — Ambulatory Visit (INDEPENDENT_AMBULATORY_CARE_PROVIDER_SITE_OTHER): Payer: Medicare Other

## 2022-06-08 DIAGNOSIS — I442 Atrioventricular block, complete: Secondary | ICD-10-CM | POA: Diagnosis not present

## 2022-06-08 LAB — CUP PACEART INCLINIC DEVICE CHECK
Battery Impedance: 3314 Ohm
Battery Remaining Longevity: 17 mo
Battery Voltage: 2.71 V
Brady Statistic AP VP Percent: 10 %
Brady Statistic AP VS Percent: 0 %
Brady Statistic AS VP Percent: 90 %
Brady Statistic AS VS Percent: 0 %
Date Time Interrogation Session: 20230713134300
Implantable Lead Implant Date: 20050208
Implantable Lead Implant Date: 20050208
Implantable Lead Location: 753859
Implantable Lead Location: 753860
Implantable Lead Model: 5076
Implantable Lead Model: 5076
Implantable Pulse Generator Implant Date: 20131008
Lead Channel Impedance Value: 441 Ohm
Lead Channel Impedance Value: 496 Ohm
Lead Channel Pacing Threshold Amplitude: 0.875 V
Lead Channel Pacing Threshold Amplitude: 1 V
Lead Channel Pacing Threshold Amplitude: 1.25 V
Lead Channel Pacing Threshold Amplitude: 1.5 V
Lead Channel Pacing Threshold Pulse Width: 0.4 ms
Lead Channel Pacing Threshold Pulse Width: 0.4 ms
Lead Channel Pacing Threshold Pulse Width: 0.4 ms
Lead Channel Pacing Threshold Pulse Width: 0.52 ms
Lead Channel Sensing Intrinsic Amplitude: 15.67 mV
Lead Channel Sensing Intrinsic Amplitude: 4 mV
Lead Channel Setting Pacing Amplitude: 1.75 V
Lead Channel Setting Pacing Amplitude: 2.5 V
Lead Channel Setting Pacing Pulse Width: 0.52 ms
Lead Channel Setting Sensing Sensitivity: 4 mV

## 2022-06-08 NOTE — Progress Notes (Signed)
Pacemaker check in clinic. Normal device function. Thresholds, sensing, impedances consistent with previous measurements. Device programmed to maximize longevity. AT/AF burden <0.1%, no EGM's. No high ventricular rates noted. Device programmed at appropriate safety margins. Histogram distribution appropriate for patient activity level. Device programmed to optimize intrinsic conduction. Estimated longevity 17 months. Patient declines remotes. Will be back in 6 months for yearly apt. w./ SK Patient education completed.

## 2022-06-09 ENCOUNTER — Telehealth (HOSPITAL_BASED_OUTPATIENT_CLINIC_OR_DEPARTMENT_OTHER): Payer: Self-pay

## 2022-06-12 ENCOUNTER — Other Ambulatory Visit (HOSPITAL_BASED_OUTPATIENT_CLINIC_OR_DEPARTMENT_OTHER): Payer: Self-pay | Admitting: Family Medicine

## 2022-06-13 ENCOUNTER — Ambulatory Visit (HOSPITAL_BASED_OUTPATIENT_CLINIC_OR_DEPARTMENT_OTHER): Payer: Medicare Other | Admitting: Physical Therapy

## 2022-06-13 ENCOUNTER — Encounter (HOSPITAL_BASED_OUTPATIENT_CLINIC_OR_DEPARTMENT_OTHER): Payer: Self-pay | Admitting: Physical Therapy

## 2022-06-13 DIAGNOSIS — Z96642 Presence of left artificial hip joint: Secondary | ICD-10-CM

## 2022-06-13 DIAGNOSIS — M25552 Pain in left hip: Secondary | ICD-10-CM | POA: Diagnosis not present

## 2022-06-13 DIAGNOSIS — M25652 Stiffness of left hip, not elsewhere classified: Secondary | ICD-10-CM

## 2022-06-13 DIAGNOSIS — R2689 Other abnormalities of gait and mobility: Secondary | ICD-10-CM

## 2022-06-13 NOTE — Therapy (Signed)
OUTPATIENT PHYSICAL THERAPY LOWER EXTREMITY TREATMENT   Patient Name: Jimmy Palmer MRN: [id number] DOB:1941-11-03, 81 y.o., male Today's Date: 06/13/2022   PT End of Session - 06/13/22 1315     Visit Number 3    Number of Visits 16    Date for PT Re-Evaluation 07/20/22    PT Start Time 1300    PT Stop Time 1344    PT Time Calculation (min) 44 min    Activity Tolerance Patient tolerated treatment well;No increased pain    Behavior During Therapy [facility] for tasks assessed/performed               Past Medical History:  Diagnosis Date   Abnormal electrocardiogram    Abnormal heart sounds    Allergy 2020   upon arrival to [facility]   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   Chronic kidney disease 11/07/2021   Depression 2020   receiving appropriate therapy   Herniated lumbar intervertebral disc    Hypertension    Keratosis    benign   Left bundle branch block    Pacemaker    Pure hypercholesterolemia    Sick sinus syndrome (HCC)    Sleep apnea    VT (ventricular tachycardia) (HCC)    Past Surgical History:  Procedure Laterality Date   CARDIOVERSION N/A 09/28/2020   Procedure: CARDIOVERSION;  Surgeon: Nahser, Deloris Ping, MD;  Location: [facility];  Service: Cardiovascular;  Laterality: N/A;   INSERT / REPLACE / REMOVE PACEMAKER     JOINT REPLACEMENT     partial right knee, medial side   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   Procedure: LEFT TOTAL HIP ARTHROPLASTY ANTERIOR APPROACH;  Surgeon: Kathryne Hitch, MD;  Location: [facility];  Service: Orthopedics;  Laterality: Left;   VASECTOMY     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   Rectal bleeding 02/01/2022   Left hip pain 02/01/2022   Environmental and seasonal allergies 02/01/2022   Hypertension 11/07/2021   Chronic kidney disease 11/07/2021   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   Atrial  tachycardia (HCC) 08/26/2020   Pacemaker - MDT 08/26/2020   Persistent atrial fibrillation (HCC) 12/22/2019    PCP: de [place], [name], MD  REFERRING PROVIDER: de [place], [name], MD  REFERRING DIAG: [phone] (ICD-10-CM) - Status post total replacement of left hip  THERAPY DIAG:  Pain in left hip  Stiffness of left hip, not elsewhere classified  Status post total replacement of left hip  Other abnormalities of gait and mobility  Rationale for Evaluation and Treatment Rehabilitation  ONSET DATE: May 05, 2022  SUBJECTIVE:   SUBJECTIVE STATEMENT: Patient reports his hip is still feeling good. He wants to discuss HEP as he has many handouts and wants to streamline his exercise. He has been taking a muscle relaxer as prescribed for the past week to address bil glute spasms.  PERTINENT HISTORY: CKD, HTN, Pacemaker, A-fib  PAIN:  Are you having pain? Yes: NPRS scale: 5/10 Pain location: Generalized hip Pain description: Persistent ache anterolateral hip, posterior spasms Aggravating factors: Prolonged positioning, bending, lifting, stairs Relieving factors: Rest, Tylenol, heat  PRECAUTIONS: Anterior hip  WEIGHT BEARING RESTRICTIONS No  FALLS:  Has patient fallen in last 6 months? No  LIVING ENVIRONMENT: Lives with: lives with their spouse Lives in: House/apartment Stairs: Yes: External: 3 steps; on  right going up Has following equipment at home: Single point cane - since surgery  OCCUPATION: Retired  PLOF: Independent  PATIENT GOALS  Wants to get out for yard work and community activity.   OBJECTIVE:   DIAGNOSTIC FINDINGS: nothing post-op   PATIENT SURVEYS:  FOTO 43% ability  68% expected in 13 visits    COGNITION:  Overall cognitive status: Within functional limits for tasks assessed     SENSATION: WFL  EDEMA:   No edema noted.  POSTURE: No Significant postural limitations   PALPATION: Mild scar tissue around the scar.   LOWER EXTREMITY  ROM:  Passive ROM Right eval Left eval  Hip flexion  115 no pain   Hip extension    Hip abduction    Hip adduction    Hip internal rotation  Not pushed to end range but pain free   Hip external rotation  Not pushed to end range but pain free  Knee flexion    Knee extension    Ankle dorsiflexion    Ankle plantarflexion    Ankle inversion    Ankle eversion     (Blank rows = not tested)  LOWER EXTREMITY MMT:  MMT Right eval Left eval  Hip flexion 36 25  Hip extension    Hip abduction 60.5 38.5  Hip adduction 40.8 49.8  Hip internal rotation    Hip external rotation    Knee flexion    Knee extension    Ankle dorsiflexion    Ankle plantarflexion    Ankle inversion    Ankle eversion     (Blank rows = not tested)    TODAY'S TREATMENT:  7/18 Nu-Step, 5 min L1 Trigger point palpation and release - bil glute med S/L clamshells - bil x15 Supine clamshells - 2x10 Bridging - 2x10  Updating HEP and education on how to perform at home    7/12 Nu-Step, 5 min L1 Manual PROM - all planes to gentle end ranges Trigger point palpation and release - bil glute med Mini squats with UE support - 3x10 Side-stepping - 3 laps Semi-tandem weight shifting, 2x30sec bil    PATIENT EDUCATION:  Education details:  Person educated: Patient Education method: Explanation Education comprehension: verbalized understanding   HOME EXERCISE PROGRAM: Access Code: [email or web address] URL: [email or web address] Date: 05/25/2022 Prepared by: Lorayne Bender  Exercises - Supine Bridge  - 1 x daily - 7 x weekly - 3 sets - 10 reps - Seated Long Arc Quad  - 1 x daily - 7 x weekly - 3 sets - 10 reps - Standing March with Counter Support  - 1 x daily - 7 x weekly - 3 sets - 10 reps - Supine Heel Slide with Strap  - 1 x daily - 7 x weekly - 3 sets - 10 reps  ASSESSMENT:  CLINICAL IMPRESSION: Patient has made good improvements overall. He reports increases in function around the home  and no concerns of pain. He is having continued glute spasms and stiffness. We discussed HEP today and recommended several exercises in the bed to warmup in the morning and functional activities in standing to be performed during the day. He tolerates exercises well and reports his glutes feel looser and better following treatment. He will continue to benefit from skilled therapy to progress pt to full function.   OBJECTIVE IMPAIRMENTS decreased activity tolerance, decreased balance, decreased endurance, difficulty walking, decreased ROM, decreased strength, and increased muscle spasms.   ACTIVITY LIMITATIONS lifting, bending, squatting, and stairs  PARTICIPATION  LIMITATIONS: occupation and yard work  PERSONAL FACTORS Age, Past/current experiences, and 3+ comorbidities: pacemaker, uka right side, recent sciatica   are also affecting patient's functional outcome.   REHAB POTENTIAL: Good  CLINICAL DECISION MAKING: Evolving/moderate complexity  EVALUATION COMPLEXITY: Moderate   GOALS: Goals reviewed with patient? Yes  SHORT TERM GOALS: Target date: 06/22/2022  Patient will increase gross strength of left hip by 5 lbs  Baseline: Goal status: INITIAL  2.  Patient will demonstrate full pain free AROM of the left hip  Baseline:  Goal status: INITIAL  3.  Patient will be independent with base exercise program  Baseline:  Goal status: INITIAL  4.  Patient will ambulate 300' without an AD  Baseline:  Goal status: INITIAL  LONG TERM GOALS: Target date: 07/20/2022   Patient will go up and down steps without increased pain  Baseline:  Goal status: INITIAL  2.  Patient will perform safe yard work tasks without pain  Baseline:  Goal status: INITIAL  3.  Patient will walk community distances without increased pain  Baseline:  Goal status: INITIAL   PLAN: PT FREQUENCY: 2x/week  PT DURATION: 8 weeks  PLANNED INTERVENTIONS: Therapeutic exercises, Therapeutic activity,  Neuromuscular re-education, Balance training, Gait training, Patient/Family education, Joint mobilization, Stair training, Aquatic Therapy, Dry Needling, Spinal mobilization, Cryotherapy, Moist heat, scar mobilization, and Ionotophoresis 4mg /ml Dexamethasone  PLAN FOR NEXT SESSION: Progress exercises as tolerated - functional activity and weight shifting. Try SLS and step-to/step ups. STM as indicated.   , PT 06/13/2022, 3:04 PM  06/15/2022 SPT   During this treatment session, the therapist was present, participating in and directing the treatment.

## 2022-06-14 ENCOUNTER — Ambulatory Visit (INDEPENDENT_AMBULATORY_CARE_PROVIDER_SITE_OTHER): Payer: Medicare Other | Admitting: Orthopaedic Surgery

## 2022-06-14 ENCOUNTER — Encounter: Payer: Self-pay | Admitting: Orthopaedic Surgery

## 2022-06-14 DIAGNOSIS — Z96642 Presence of left artificial hip joint: Secondary | ICD-10-CM

## 2022-06-14 NOTE — Progress Notes (Signed)
The patient is a very active 81 year old gentleman who is now 6 weeks status post a left total hip arthroplasty.  He has been dealing with worsening hip pain for 3 years.  He is very pleased with his hip replacement.  He did see one of our partners recently Dr. Steward Drone for SI joint pain and really sciatica bilaterally.  He is now tied into the therapist out of that facility and is having physical therapy and transitioning into their regular exercise program as an outpatient in terms of the facility for working out.  He is ambulate without assistive device.  His biggest problems are sciatic pain and SI joint pain in the morning when he first gets up.  They have shown him stretching exercises to try.  His left operative hip moves smoothly and fluidly with no blocks to rotation at all.  His leg lengths are equal.  He has a normal stride with his gait.  From my standpoint, I do not need to see him back until December.  If there are issues before then he will let us know.  If not, at his next visit I would like a standing low AP pelvis and lateral of his left operative hip.

## 2022-06-15 ENCOUNTER — Encounter (HOSPITAL_BASED_OUTPATIENT_CLINIC_OR_DEPARTMENT_OTHER): Payer: Self-pay | Admitting: Physical Therapy

## 2022-06-15 ENCOUNTER — Ambulatory Visit (HOSPITAL_BASED_OUTPATIENT_CLINIC_OR_DEPARTMENT_OTHER): Payer: Medicare Other | Admitting: Physical Therapy

## 2022-06-15 DIAGNOSIS — M25652 Stiffness of left hip, not elsewhere classified: Secondary | ICD-10-CM

## 2022-06-15 DIAGNOSIS — M25552 Pain in left hip: Secondary | ICD-10-CM

## 2022-06-15 DIAGNOSIS — R2689 Other abnormalities of gait and mobility: Secondary | ICD-10-CM

## 2022-06-15 DIAGNOSIS — Z96642 Presence of left artificial hip joint: Secondary | ICD-10-CM

## 2022-06-15 NOTE — Therapy (Signed)
OUTPATIENT PHYSICAL THERAPY LOWER EXTREMITY TREATMENT   Patient Name: Jimmy Palmer MRN: [id number] DOB:[birth year], 81 y.o., male Today's Date: 06/15/2022   PT End of Session - 06/15/22 1439     Visit Number 4    Number of Visits 16    Date for PT Re-Evaluation 07/20/22    PT Start Time 1431    PT Stop Time 1511    PT Time Calculation (min) 40 min    Activity Tolerance Patient tolerated treatment well;No increased pain    Behavior During Therapy [facility] for tasks assessed/performed                Past Medical History:  Diagnosis Date   Abnormal electrocardiogram    Abnormal heart sounds    Allergy 2020   upon arrival to [facility]   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   Chronic kidney disease 11/07/2021   Depression 2020   receiving appropriate therapy   Herniated lumbar intervertebral disc    Hypertension    Keratosis    benign   Left bundle branch block    Pacemaker    Pure hypercholesterolemia    Sick sinus syndrome (HCC)    Sleep apnea    VT (ventricular tachycardia) (HCC)    Past Surgical History:  Procedure Laterality Date   CARDIOVERSION N/A 09/28/2020   Procedure: CARDIOVERSION;  Surgeon: Nahser, Deloris Ping, MD;  Location: [facility];  Service: Cardiovascular;  Laterality: N/A;   INSERT / REPLACE / REMOVE PACEMAKER     JOINT REPLACEMENT     partial right knee, medial side   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   Procedure: LEFT TOTAL HIP ARTHROPLASTY ANTERIOR APPROACH;  Surgeon: Kathryne Hitch, MD;  Location: [facility];  Service: Orthopedics;  Laterality: Left;   VASECTOMY     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   Rectal bleeding 02/01/2022   Left hip pain 02/01/2022   Environmental and seasonal allergies 02/01/2022   Hypertension 11/07/2021   Chronic kidney disease 11/07/2021   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 Magnus Ivan yesterday, he is happy. I told him he has done a great job on the hip itself, by the way I'm a member here I'm quite impressed with this facility, but I'm having a lot of residual effects still like muscle cramps and pain in both sides of my glutes. Dr. 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 I've only done what Lorayne Bender gave me for the mornings one time because I've been rather busy.   PERTINENT HISTORY: CKD, HTN, Pacemaker, A-fib  PAIN:  Are you having pain? Yes: NPRS scale: 3/10 Pain location: Generalized hip Pain description: Persistent ache anterolateral hip, posterior  spasms Aggravating factors: Prolonged positioning, bending, lifting, stairs Relieving factors: Rest, Tylenol, heat  PRECAUTIONS: Anterior hip  WEIGHT BEARING RESTRICTIONS No  FALLS:  Has patient fallen in last 6 months? No  LIVING ENVIRONMENT: Lives with: lives with their spouse Lives in: House/apartment Stairs: Yes: External: 3 steps; on right going up Has following equipment at home: Single point cane - since surgery  OCCUPATION: Retired  PLOF: Independent  PATIENT GOALS  Wants to get out for yard work and community activity.   OBJECTIVE:   DIAGNOSTIC FINDINGS: nothing post-op   PATIENT SURVEYS:  FOTO 43% ability  68% expected in 13 visits    COGNITION:  Overall cognitive  status: Within functional limits for tasks assessed     SENSATION: WFL  EDEMA:   No edema noted.  POSTURE: No Significant postural limitations   PALPATION: Mild scar tissue around the scar.   LOWER EXTREMITY ROM:  Passive ROM Right eval Left eval  Hip flexion  115 no pain   Hip extension    Hip abduction    Hip adduction    Hip internal rotation  Not pushed to end range but pain free   Hip external rotation  Not pushed to end range but pain free  Knee flexion    Knee extension    Ankle dorsiflexion    Ankle plantarflexion    Ankle inversion    Ankle eversion     (Blank rows = not tested)  LOWER EXTREMITY MMT:  MMT Right eval Left eval  Hip flexion 36 25  Hip extension    Hip abduction 60.5 38.5  Hip adduction 40.8 49.8  Hip internal rotation    Hip external rotation    Knee flexion    Knee extension    Ankle dorsiflexion    Ankle plantarflexion    Ankle inversion    Ankle eversion     (Blank rows = not tested)    TODAY'S TREATMENT:  7/20  Nustep L2 x6 minutes BLEs only   Forward and lateral step ups 1x10 B 2 inch step  2 way hip (flexion and ABD) yellow TB 1x10 B STS with yellow TB above knees 1x10             Lateral weight shifting with one foot on 2 inch box x10 B              IASTM B glutes      7/18 Nu-Step, 5 min L1 Trigger point palpation and release - bil glute med S/L clamshells - bil x15 Supine clamshells - 2x10 Bridging - 2x10  Updating HEP and education on how to perform at home    7/12 Nu-Step, 5 min L1 Manual PROM - all planes to gentle end ranges Trigger point palpation and release - bil glute med Mini squats with UE support - 3x10 Side-stepping - 3 laps Semi-tandem weight shifting, 2x30sec bil    PATIENT EDUCATION:  Education details:  Person educated: Patient Education method: Explanation Education comprehension: verbalized understanding   HOME EXERCISE PROGRAM: Access Code: [email or web address] URL:  [email or web address] Date: 05/25/2022 Prepared by: Lorayne Bender  Exercises - Supine Bridge  - 1 x daily - 7 x weekly - 3 sets - 10 reps - Seated Long Arc Quad  - 1 x daily - 7 x weekly - 3 sets - 10 reps - Standing March with Counter Support  - 1 x daily - 7 x weekly - 3 sets - 10 reps - Supine Heel Slide with Strap  -  1 x daily - 7 x weekly - 3 sets - 10 reps  ASSESSMENT:  CLINICAL IMPRESSION:  Mr. Seefried arrives today doing well, still having some issues with SI pain in the mornings. We warmed up on the Nustep, then progressed exercises as tolerated and finished with STM to glutes to reduce any soreness or pain related to today's treatment.   OBJECTIVE IMPAIRMENTS decreased activity tolerance, decreased balance, decreased endurance, difficulty walking, decreased ROM, decreased strength, and increased muscle spasms.   ACTIVITY LIMITATIONS lifting, bending, squatting, and stairs  PARTICIPATION LIMITATIONS: occupation and yard work  PERSONAL FACTORS Age, Past/current experiences, and 3+ comorbidities: pacemaker, uka right side, recent sciatica   are also affecting patient's functional outcome.   REHAB POTENTIAL: Good  CLINICAL DECISION MAKING: Evolving/moderate complexity  EVALUATION COMPLEXITY: Moderate   GOALS: Goals reviewed with patient? Yes  SHORT TERM GOALS: Target date: 06/22/2022  Patient will increase gross strength of left hip by 5 lbs  Baseline: Goal status: INITIAL  2.  Patient will demonstrate full pain free AROM of the left hip  Baseline:  Goal status: INITIAL  3.  Patient will be independent with base exercise program  Baseline:  Goal status: INITIAL  4.  Patient will ambulate 300' without an AD  Baseline:  Goal status: INITIAL  LONG TERM GOALS: Target date: 07/20/2022   Patient will go up and down steps without increased pain  Baseline:  Goal status: INITIAL  2.  Patient will perform safe yard work tasks without pain  Baseline:   Goal status: INITIAL  3.  Patient will walk community distances without increased pain  Baseline:  Goal status: INITIAL   PLAN: PT FREQUENCY: 2x/week  PT DURATION: 8 weeks  PLANNED INTERVENTIONS: Therapeutic exercises, Therapeutic activity, Neuromuscular re-education, Balance training, Gait training, Patient/Family education, Joint mobilization, Stair training, Aquatic Therapy, Dry Needling, Spinal mobilization, Cryotherapy, Moist heat, scar mobilization, and Ionotophoresis 4mg /ml Dexamethasone  PLAN FOR NEXT SESSION: Progress exercises as tolerated - functional activity and weight shifting. Try SLS and step-to/step ups. STM as indicated.   Mishaal Lansdale U PT DPT PN2  06/15/2022, 3:12 PM

## 2022-06-20 ENCOUNTER — Ambulatory Visit (HOSPITAL_BASED_OUTPATIENT_CLINIC_OR_DEPARTMENT_OTHER): Payer: Medicare Other | Admitting: Physical Therapy

## 2022-06-20 ENCOUNTER — Encounter (HOSPITAL_BASED_OUTPATIENT_CLINIC_OR_DEPARTMENT_OTHER): Payer: Self-pay | Admitting: Physical Therapy

## 2022-06-20 DIAGNOSIS — R2689 Other abnormalities of gait and mobility: Secondary | ICD-10-CM

## 2022-06-20 DIAGNOSIS — M25652 Stiffness of left hip, not elsewhere classified: Secondary | ICD-10-CM

## 2022-06-20 DIAGNOSIS — Z96642 Presence of left artificial hip joint: Secondary | ICD-10-CM

## 2022-06-20 DIAGNOSIS — M25552 Pain in left hip: Secondary | ICD-10-CM

## 2022-06-20 NOTE — Therapy (Signed)
OUTPATIENT PHYSICAL THERAPY LOWER EXTREMITY TREATMENT   Patient Name: RAHIL PASSEY MRN: [id number] DOB:[birth year], 81 y.o., male Today's Date: 06/21/2022   PT End of Session - 06/20/22 1423     Visit Number 5    Number of Visits 16    Date for PT Re-Evaluation 07/20/22    PT Start Time 1424    PT Stop Time 1510    PT Time Calculation (min) 46 min    Activity Tolerance Patient tolerated treatment well;No increased pain    Behavior During Therapy [facility] for tasks assessed/performed                Past Medical History:  Diagnosis Date   Abnormal electrocardiogram    Abnormal heart sounds    Allergy 2020   upon arrival to [facility]   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   Chronic kidney disease 11/07/2021   Depression 2020   receiving appropriate therapy   Herniated lumbar intervertebral disc    Hypertension    Keratosis    benign   Left bundle branch block    Pacemaker    Pure hypercholesterolemia    Sick sinus syndrome (HCC)    Sleep apnea    VT (ventricular tachycardia) (HCC)    Past Surgical History:  Procedure Laterality Date   CARDIOVERSION N/A 09/28/2020   Procedure: CARDIOVERSION;  Surgeon: Nahser, Deloris Ping, MD;  Location: [facility];  Service: Cardiovascular;  Laterality: N/A;   INSERT / REPLACE / REMOVE PACEMAKER     JOINT REPLACEMENT     partial right knee, medial side   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   Procedure: LEFT TOTAL HIP ARTHROPLASTY ANTERIOR APPROACH;  Surgeon: Kathryne Hitch, MD;  Location: [facility];  Service: Orthopedics;  Laterality: Left;   VASECTOMY     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   Rectal bleeding 02/01/2022   Left hip pain 02/01/2022   Environmental and seasonal allergies 02/01/2022   Hypertension 11/07/2021   Chronic kidney disease 11/07/2021   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   Atrial  tachycardia (HCC) 08/26/2020   Pacemaker - MDT 08/26/2020   Persistent atrial fibrillation (HCC) 12/22/2019    PCP: de [place], [name], MD  REFERRING PROVIDER: de [place], [name], MD  REFERRING DIAG: [phone] (ICD-10-CM) - Status post total replacement of left hip  THERAPY DIAG:  Pain in left hip  Status post total replacement of left hip  Stiffness of left hip, not elsewhere classified  Other abnormalities of gait and mobility  Rationale for Evaluation and Treatment Rehabilitation  ONSET DATE: May 05, 2022  SUBJECTIVE:   SUBJECTIVE STATEMENT:  Immediately put heat on Rt hip in the AM to loosen it up.   PERTINENT HISTORY: CKD, HTN, Pacemaker, A-fib  PAIN:  Are you having pain? Yes: NPRS scale: 3/10 Pain location: Generalized hip Pain description: Persistent ache anterolateral hip, posterior spasms Aggravating factors: Prolonged positioning, bending, lifting, stairs Relieving factors: Rest, Tylenol, heat  PRECAUTIONS: Anterior hip  WEIGHT BEARING RESTRICTIONS No  FALLS:  Has patient fallen in last 6 months? No  LIVING ENVIRONMENT: Lives with: lives with their spouse Lives in: House/apartment Stairs: Yes: External: 3 steps; on right going up Has following equipment at home: Single point cane - since surgery  OCCUPATION: Retired  PLOF: Independent  PATIENT GOALS  Wants to  get out for yard work and community activity.   OBJECTIVE:   DIAGNOSTIC FINDINGS: nothing post-op   PATIENT SURVEYS:  FOTO 43% ability  68% expected in 13 visits    COGNITION:  Overall cognitive status: Within functional limits for tasks assessed     SENSATION: WFL  EDEMA:   No edema noted.  POSTURE: No Significant postural limitations   PALPATION: Mild scar tissue around the scar.   LOWER EXTREMITY ROM:  Passive ROM Right eval Left eval  Hip flexion  115 no pain   Hip extension    Hip abduction    Hip adduction    Hip internal rotation  Not pushed to end range  but pain free   Hip external rotation  Not pushed to end range but pain free  Knee flexion    Knee extension    Ankle dorsiflexion    Ankle plantarflexion    Ankle inversion    Ankle eversion     (Blank rows = not tested)  LOWER EXTREMITY MMT:  MMT Right eval Left eval  Hip flexion 36 25  Hip extension    Hip abduction 60.5 38.5  Hip adduction 40.8 49.8  Hip internal rotation    Hip external rotation    Knee flexion    Knee extension    Ankle dorsiflexion    Ankle plantarflexion    Ankle inversion    Ankle eversion     (Blank rows = not tested)    TODAY'S TREATMENT:  7/25: Education regarding aquatic rehab 2-layer heel lift in left shoe, walking for trial with cues to increase arm swing for trunk rotation MANUAL: prone bil gluts, piriformis, TFL  7/20  Nustep L2 x6 minutes BLEs only   Forward and lateral step ups 1x10 B 2 inch step  2 way hip (flexion and ABD) yellow TB 1x10 B STS with yellow TB above knees 1x10             Lateral weight shifting with one foot on 2 inch box x10 B              IASTM B glutes      7/18 Nu-Step, 5 min L1 Trigger point palpation and release - bil glute med S/L clamshells - bil x15 Supine clamshells - 2x10 Bridging - 2x10  Updating HEP and education on how to perform at home      PATIENT EDUCATION:  Education details: aquatic PT, used spine for explanation of anatomy of condition, heel lift & plan Person educated: Patient Education method: Explanation Education comprehension: verbalized understanding   HOME EXERCISE PROGRAM: Access Code: [email or web address] URL: [email or web address]   ASSESSMENT:  CLINICAL IMPRESSION:  Resolution of symptoms in Rt hip with addition of 2-layer heel lift. Will wear this consistently until next appointment to determine need for further use. Advised him to remove the lift should any concordant pain worsen.   OBJECTIVE IMPAIRMENTS decreased activity tolerance, decreased  balance, decreased endurance, difficulty walking, decreased ROM, decreased strength, and increased muscle spasms.   ACTIVITY LIMITATIONS lifting, bending, squatting, and stairs  PARTICIPATION LIMITATIONS: occupation and yard work  PERSONAL FACTORS Age, Past/current experiences, and 3+ comorbidities: pacemaker, uka right side, recent sciatica   are also affecting patient's functional outcome.   REHAB POTENTIAL: Good  CLINICAL DECISION MAKING: Evolving/moderate complexity  EVALUATION COMPLEXITY: Moderate   GOALS: Goals reviewed with patient? Yes  SHORT TERM GOALS: Target date: 06/22/2022  Patient will increase gross strength of left hip by 5 lbs  Baseline:  Goal status: INITIAL  2.  Patient will demonstrate full pain free AROM of the left hip  Baseline:  Goal status: INITIAL  3.  Patient will be independent with base exercise program  Baseline:  Goal status: INITIAL  4.  Patient will ambulate 300' without an AD  Baseline:  Goal status: INITIAL  LONG TERM GOALS: Target date: 07/20/2022   Patient will go up and down steps without increased pain  Baseline:  Goal status: INITIAL  2.  Patient will perform safe yard work tasks without pain  Baseline:  Goal status: INITIAL  3.  Patient will walk community distances without increased pain  Baseline:  Goal status: INITIAL   PLAN: PT FREQUENCY: 2x/week  PT DURATION: 8 weeks  PLANNED INTERVENTIONS: Therapeutic exercises, Therapeutic activity, Neuromuscular re-education, Balance training, Gait training, Patient/Family education, Joint mobilization, Stair training, Aquatic Therapy, Dry Needling, Spinal mobilization, Cryotherapy, Moist heat, scar mobilization, and Ionotophoresis 4mg /ml Dexamethasone  PLAN FOR NEXT SESSION: check STGs, evaluate outcome of heel lift  Dontravious Camille C. Kasin Tonkinson PT, DPT 06/21/22 10:37 AM

## 2022-06-21 ENCOUNTER — Ambulatory Visit (HOSPITAL_BASED_OUTPATIENT_CLINIC_OR_DEPARTMENT_OTHER): Payer: Medicare Other | Admitting: Family Medicine

## 2022-06-21 ENCOUNTER — Encounter (HOSPITAL_BASED_OUTPATIENT_CLINIC_OR_DEPARTMENT_OTHER): Payer: Self-pay | Admitting: Physical Therapy

## 2022-06-23 ENCOUNTER — Ambulatory Visit (HOSPITAL_BASED_OUTPATIENT_CLINIC_OR_DEPARTMENT_OTHER): Payer: Medicare Other | Admitting: Physical Therapy

## 2022-06-23 ENCOUNTER — Encounter (HOSPITAL_BASED_OUTPATIENT_CLINIC_OR_DEPARTMENT_OTHER): Payer: Self-pay | Admitting: Physical Therapy

## 2022-06-23 DIAGNOSIS — M25552 Pain in left hip: Secondary | ICD-10-CM

## 2022-06-23 DIAGNOSIS — Z96642 Presence of left artificial hip joint: Secondary | ICD-10-CM

## 2022-06-23 DIAGNOSIS — M25652 Stiffness of left hip, not elsewhere classified: Secondary | ICD-10-CM

## 2022-06-23 DIAGNOSIS — R2689 Other abnormalities of gait and mobility: Secondary | ICD-10-CM

## 2022-06-23 NOTE — Therapy (Signed)
OUTPATIENT PHYSICAL THERAPY LOWER EXTREMITY TREATMENT   Patient Name: Jimmy Palmer MRN: [id number] DOB:1941/07/17, 81 y.o., male Today's Date: 06/23/2022        Past Medical History:  Diagnosis Date   Abnormal electrocardiogram    Abnormal heart sounds    Allergy 2020   upon arrival to [facility]   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   Chronic kidney disease 11/07/2021   Depression 2020   receiving appropriate therapy   Herniated lumbar intervertebral disc    Hypertension    Keratosis    benign   Left bundle branch block    Pacemaker    Pure hypercholesterolemia    Sick sinus syndrome (HCC)    Sleep apnea    VT (ventricular tachycardia) (HCC)    Past Surgical History:  Procedure Laterality Date   CARDIOVERSION N/A 09/28/2020   Procedure: CARDIOVERSION;  Surgeon: Elease Hashimoto Deloris Ping, MD;  Location: [facility];  Service: Cardiovascular;  Laterality: N/A;   INSERT / REPLACE / REMOVE PACEMAKER     JOINT REPLACEMENT     partial right knee, medial side   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   Procedure: LEFT TOTAL HIP ARTHROPLASTY ANTERIOR APPROACH;  Surgeon: Kathryne Hitch, MD;  Location: [facility];  Service: Orthopedics;  Laterality: Left;   VASECTOMY     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   Rectal bleeding 02/01/2022   Left hip pain 02/01/2022   Environmental and seasonal allergies 02/01/2022   Hypertension 11/07/2021   Chronic kidney disease 11/07/2021   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  Rationale for Evaluation and Treatment  Rehabilitation  ONSET DATE: May 05, 2022  SUBJECTIVE:   SUBJECTIVE STATEMENT:  Immediately put heat on Rt hip in the AM to loosen it up.   PERTINENT HISTORY: CKD, HTN, Pacemaker, A-fib  PAIN:  Are you having pain? Yes: NPRS scale: 3/10 Pain location: Generalized hip Pain description: Persistent ache anterolateral hip, posterior spasms Aggravating factors: Prolonged positioning, bending, lifting, stairs Relieving factors: Rest, Tylenol, heat  PRECAUTIONS: Anterior hip  WEIGHT BEARING RESTRICTIONS No  FALLS:  Has patient fallen in last 6 months? No  LIVING ENVIRONMENT: Lives with: lives with their spouse Lives in: House/apartment Stairs: Yes: External: 3 steps; on right going up Has following equipment at home: Single point cane - since surgery  OCCUPATION: Retired  PLOF: Independent  PATIENT GOALS  Wants to get out for yard work and community activity.   OBJECTIVE:   DIAGNOSTIC FINDINGS: nothing post-op   PATIENT SURVEYS:  FOTO 43% ability  68% expected in 13 visits    COGNITION:  Overall cognitive status: Within functional limits for tasks assessed     SENSATION: WFL  EDEMA:   No edema noted.  POSTURE: No Significant postural limitations   PALPATION: Mild scar tissue around the scar.   LOWER EXTREMITY ROM:  Passive ROM Right eval Left eval  Hip flexion  115 no pain   Hip extension    Hip abduction    Hip adduction  Hip internal rotation  Not pushed to end range but pain free   Hip external rotation  Not pushed to end range but pain free  Knee flexion    Knee extension    Ankle dorsiflexion    Ankle plantarflexion    Ankle inversion    Ankle eversion     (Blank rows = not tested)  LOWER EXTREMITY MMT:  MMT Right eval Left eval  Hip flexion 36 25  Hip extension    Hip abduction 60.5 38.5  Hip adduction 40.8 49.8  Hip internal rotation    Hip external rotation    Knee flexion    Knee extension    Ankle dorsiflexion    Ankle  plantarflexion    Ankle inversion    Ankle eversion     (Blank rows = not tested)    TODAY'S TREATMENT: 7/28 MANUAL: prone bil gluts, piriformis, TFL; anterior hip on the left  LTR x20  Bridging x10  Hip hip abduction 2x10 red with cuing  Forward and lateral step ups 1x10 B 2 inch step  Bilater laq 2x10   7/25: Education regarding aquatic rehab 2-layer heel lift in left shoe, walking for trial with cues to increase arm swing for trunk rotation MANUAL: prone bil gluts, piriformis, TFL  7/20  Nustep L2 x6 minutes BLEs only   Forward and lateral step ups 1x10 B 2 inch step  2 way hip (flexion and ABD) yellow TB 1x10 B STS with yellow TB above knees 1x10             Lateral weight shifting with one foot on 2 inch box x10 B              IASTM B glutes      7/18 Nu-Step, 5 min L1 Trigger point palpation and release - bil glute med S/L clamshells - bil x15 Supine clamshells - 2x10 Bridging - 2x10  Updating HEP and education on how to perform at home      PATIENT EDUCATION:  Education details: aquatic PT, used spine for explanation of anatomy of condition, heel lift & plan Person educated: Patient Education method: Explanation Education comprehension: verbalized understanding   HOME EXERCISE PROGRAM: Access Code: [email or web address] URL: [email or web address]   ASSESSMENT:  CLINICAL IMPRESSION: The patient continues to have pain in his gluteal muscles and trigger points but it appears to be improving. Therapy worked on manual therapy to his gluteal area. He had some difficulty with bridging a few weeks ago but did better today> he was advised to work on it at home in a limited range. He continues to tolerate step ups well. Therapy will advance step height next visit.   OBJECTIVE IMPAIRMENTS decreased activity tolerance, decreased balance, decreased endurance, difficulty walking, decreased ROM, decreased strength, and increased muscle spasms.   ACTIVITY  LIMITATIONS lifting, bending, squatting, and stairs  PARTICIPATION LIMITATIONS: occupation and yard work  PERSONAL FACTORS Age, Past/current experiences, and 3+ comorbidities: pacemaker, uka right side, recent sciatica   are also affecting patient's functional outcome.   REHAB POTENTIAL: Good  CLINICAL DECISION MAKING: Evolving/moderate complexity  EVALUATION COMPLEXITY: Moderate   GOALS: Goals reviewed with patient? Yes  SHORT TERM GOALS: Target date: 06/22/2022  Patient will increase gross strength of left hip by 5 lbs  Baseline: Goal status: INITIAL  2.  Patient will demonstrate full pain free AROM of the left hip  Baseline:  Goal status: INITIAL  3.  Patient will be independent with base exercise program  Baseline:  Goal status: INITIAL  4.  Patient will ambulate 300' without an AD  Baseline:  Goal status: INITIAL  LONG TERM GOALS: Target date: 07/20/2022   Patient will go up and down steps without increased pain  Baseline:  Goal status: INITIAL  2.  Patient will perform safe yard work tasks without pain  Baseline:  Goal status: INITIAL  3.  Patient will walk community distances without increased pain  Baseline:  Goal status: INITIAL   PLAN: PT FREQUENCY: 2x/week  PT DURATION: 8 weeks  PLANNED INTERVENTIONS: Therapeutic exercises, Therapeutic activity, Neuromuscular re-education, Balance training, Gait training, Patient/Family education, Joint mobilization, Stair training, Aquatic Therapy, Dry Needling, Spinal mobilization, Cryotherapy, Moist heat, scar mobilization, and Ionotophoresis 4mg /ml Dexamethasone  PLAN FOR NEXT SESSION: check STGs, evaluate outcome of heel lift  PT DPT  06/23/22 1:08 PM

## 2022-06-27 ENCOUNTER — Other Ambulatory Visit (HOSPITAL_BASED_OUTPATIENT_CLINIC_OR_DEPARTMENT_OTHER): Payer: Self-pay | Admitting: Family Medicine

## 2022-06-27 ENCOUNTER — Ambulatory Visit (HOSPITAL_BASED_OUTPATIENT_CLINIC_OR_DEPARTMENT_OTHER): Payer: Medicare Other | Attending: Orthopaedic Surgery | Admitting: Physical Therapy

## 2022-06-27 DIAGNOSIS — M25652 Stiffness of left hip, not elsewhere classified: Secondary | ICD-10-CM | POA: Diagnosis present

## 2022-06-27 DIAGNOSIS — M25552 Pain in left hip: Secondary | ICD-10-CM | POA: Insufficient documentation

## 2022-06-27 DIAGNOSIS — R2689 Other abnormalities of gait and mobility: Secondary | ICD-10-CM | POA: Insufficient documentation

## 2022-06-27 DIAGNOSIS — Z96642 Presence of left artificial hip joint: Secondary | ICD-10-CM | POA: Diagnosis present

## 2022-06-27 NOTE — Therapy (Signed)
OUTPATIENT PHYSICAL THERAPY LOWER EXTREMITY TREATMENT   Patient Name: Jimmy Palmer MRN: [id number] DOB:August 30, 1941, 81 y.o., male Today's Date: 06/28/2022   PT End of Session - 06/28/22 0711     Visit Number 7    Number of Visits 16    Date for PT Re-Evaluation 07/20/22    PT Start Time 1345    PT Stop Time 1427    PT Time Calculation (min) 42 min    Activity Tolerance Patient tolerated treatment well;No increased pain    Behavior During Therapy [facility] for tasks assessed/performed                 Past Medical History:  Diagnosis Date   Abnormal electrocardiogram    Abnormal heart sounds    Allergy 2020   upon arrival to [facility]   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   Chronic kidney disease 11/07/2021   Depression 2020   receiving appropriate therapy   Herniated lumbar intervertebral disc    Hypertension    Keratosis    benign   Left bundle branch block    Pacemaker    Pure hypercholesterolemia    Sick sinus syndrome (HCC)    Sleep apnea    VT (ventricular tachycardia) (HCC)    Past Surgical History:  Procedure Laterality Date   CARDIOVERSION N/A 09/28/2020   Procedure: CARDIOVERSION;  Surgeon: Nahser, Deloris Ping, MD;  Location: [facility];  Service: Cardiovascular;  Laterality: N/A;   INSERT / REPLACE / REMOVE PACEMAKER     JOINT REPLACEMENT     partial right knee, medial side   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   Procedure: LEFT TOTAL HIP ARTHROPLASTY ANTERIOR APPROACH;  Surgeon: Kathryne Hitch, MD;  Location: [facility];  Service: Orthopedics;  Laterality: Left;   VASECTOMY     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   Rectal bleeding 02/01/2022   Left hip pain 02/01/2022   Environmental and seasonal allergies 02/01/2022   Hypertension 11/07/2021   Chronic kidney disease 11/07/2021   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   Atrial  tachycardia (HCC) 08/26/2020   Pacemaker - MDT 08/26/2020   Persistent atrial fibrillation (HCC) 12/22/2019    PCP: de [place], [name], MD  REFERRING PROVIDER: de [place], [name], MD  REFERRING DIAG: [phone] (ICD-10-CM) - Status post total replacement of left hip  THERAPY DIAG:  Pain in left hip  Status post total replacement of left hip  Stiffness of left hip, not elsewhere classified  Other abnormalities of gait and mobility  Rationale for Evaluation and Treatment Rehabilitation  ONSET DATE: May 05, 2022  SUBJECTIVE:   SUBJECTIVE STATEMENT:  Patient continues to report some tightness in his right lower back in the morning. It can be on the left as well at times.   PERTINENT HISTORY: CKD, HTN, Pacemaker, A-fib  PAIN:  Are you having pain? Yes: NPRS scale: 3/10 Pain location: Generalized hip Pain description: Persistent ache anterolateral hip, posterior spasms Aggravating factors: Prolonged positioning, bending, lifting, stairs Relieving factors: Rest, Tylenol, heat  PRECAUTIONS: Anterior hip  WEIGHT BEARING RESTRICTIONS No  FALLS:  Has patient fallen in last 6 months? No  LIVING ENVIRONMENT: Lives with: lives with their spouse Lives in: House/apartment Stairs: Yes: External: 3 steps; on right going up Has following equipment at home: Single point cane - since surgery  OCCUPATION: Retired  PLOF: Independent  PATIENT GOALS  Wants to get out for yard work and community activity.   OBJECTIVE:   DIAGNOSTIC FINDINGS: nothing post-op   PATIENT SURVEYS:  FOTO 43% ability  68% expected in 13 visits    COGNITION:  Overall cognitive status: Within functional limits for tasks assessed     SENSATION: WFL  EDEMA:   No edema noted.  POSTURE: No Significant postural limitations   PALPATION: Mild scar tissue around the scar.   LOWER EXTREMITY ROM:  Passive ROM Right eval Left eval  Hip flexion  115 no pain   Hip extension    Hip abduction     Hip adduction    Hip internal rotation  Not pushed to end range but pain free   Hip external rotation  Not pushed to end range but pain free  Knee flexion    Knee extension    Ankle dorsiflexion    Ankle plantarflexion    Ankle inversion    Ankle eversion     (Blank rows = not tested)  LOWER EXTREMITY MMT:  MMT Right eval Left eval  Hip flexion 36 25  Hip extension    Hip abduction 60.5 38.5  Hip adduction 40.8 49.8  Hip internal rotation    Hip external rotation    Knee flexion    Knee extension    Ankle dorsiflexion    Ankle plantarflexion    Ankle inversion    Ankle eversion     (Blank rows = not tested)    TODAY'S TREATMENT: 8/2 MANUAL: prone bil gluts, piriformis, TFL; anterior hip on the left  LTR x20  Bridging x10  Hip hip abduction 2x10 red with cuing   Standing low march x10 each leg  Standing hip abduction 2x10 each leg Standing hip extension 2x10 each leg   Nu-step 5 min L3   7/28 MANUAL: prone bil gluts, piriformis, TFL; anterior hip on the left  LTR x20  Bridging x10  Hip hip abduction 2x10 red with cuing  Forward and lateral step ups 1x10 B 2 inch step  Bilater laq 2x10   7/25: Education regarding aquatic rehab 2-layer heel lift in left shoe, walking for trial with cues to increase arm swing for trunk rotation MANUAL: prone bil gluts, piriformis, TFL  7/20  Nustep L2 x6 minutes BLEs only   Forward and lateral step ups 1x10 B 2 inch step  2 way hip (flexion and ABD) yellow TB 1x10 B STS with yellow TB above knees 1x10             Lateral weight shifting with one foot on 2 inch box x10 B              IASTM B glutes      7/18 Nu-Step, 5 min L1 Trigger point palpation and release - bil glute med S/L clamshells - bil x15 Supine clamshells - 2x10 Bridging - 2x10  Updating HEP and education on how to perform at home      PATIENT EDUCATION:  Education details: aquatic PT, used spine for explanation of anatomy of condition,  heel lift & plan Person educated: Patient Education method: Explanation Education comprehension: verbalized understanding   HOME EXERCISE PROGRAM: Access Code: [email or web address] URL: [email or web address]   ASSESSMENT:  CLINICAL IMPRESSION:  The patient is making goo progress. He continues to have some spasming but that is to be expected. He is doing better with is base exercises. We expanded his standing exercises without  pain. We will continue to work on steps and squatting.   OBJECTIVE IMPAIRMENTS decreased activity tolerance, decreased balance, decreased endurance, difficulty walking, decreased ROM, decreased strength, and increased muscle spasms.   ACTIVITY LIMITATIONS lifting, bending, squatting, and stairs  PARTICIPATION LIMITATIONS: occupation and yard work  PERSONAL FACTORS Age, Past/current experiences, and 3+ comorbidities: pacemaker, uka right side, recent sciatica   are also affecting patient's functional outcome.   REHAB POTENTIAL: Good  CLINICAL DECISION MAKING: Evolving/moderate complexity  EVALUATION COMPLEXITY: Moderate   GOALS: Goals reviewed with patient? Yes  SHORT TERM GOALS: Target date: 06/22/2022  Patient will increase gross strength of left hip by 5 lbs  Baseline: Goal status: INITIAL  2.  Patient will demonstrate full pain free AROM of the left hip  Baseline:  Goal status: INITIAL  3.  Patient will be independent with base exercise program  Baseline:  Goal status: INITIAL  4.  Patient will ambulate 300' without an AD  Baseline:  Goal status: INITIAL  LONG TERM GOALS: Target date: 07/20/2022   Patient will go up and down steps without increased pain  Baseline:  Goal status: INITIAL  2.  Patient will perform safe yard work tasks without pain  Baseline:  Goal status: INITIAL  3.  Patient will walk community distances without increased pain  Baseline:  Goal status: INITIAL   PLAN: PT FREQUENCY: 2x/week  PT DURATION: 8  weeks  PLANNED INTERVENTIONS: Therapeutic exercises, Therapeutic activity, Neuromuscular re-education, Balance training, Gait training, Patient/Family education, Joint mobilization, Stair training, Aquatic Therapy, Dry Needling, Spinal mobilization, Cryotherapy, Moist heat, scar mobilization, and Ionotophoresis 4mg /ml Dexamethasone  PLAN FOR NEXT SESSION: check STGs, evaluate outcome of heel lift  PT DPT  06/28/22 7:13 AM

## 2022-06-28 ENCOUNTER — Encounter (HOSPITAL_BASED_OUTPATIENT_CLINIC_OR_DEPARTMENT_OTHER): Payer: Self-pay | Admitting: Physical Therapy

## 2022-06-29 ENCOUNTER — Encounter (HOSPITAL_BASED_OUTPATIENT_CLINIC_OR_DEPARTMENT_OTHER): Payer: Self-pay | Admitting: Physical Therapy

## 2022-06-29 ENCOUNTER — Ambulatory Visit (HOSPITAL_BASED_OUTPATIENT_CLINIC_OR_DEPARTMENT_OTHER): Payer: Medicare Other | Admitting: Physical Therapy

## 2022-06-29 DIAGNOSIS — M25652 Stiffness of left hip, not elsewhere classified: Secondary | ICD-10-CM

## 2022-06-29 DIAGNOSIS — Z96642 Presence of left artificial hip joint: Secondary | ICD-10-CM

## 2022-06-29 DIAGNOSIS — M25552 Pain in left hip: Secondary | ICD-10-CM

## 2022-06-29 DIAGNOSIS — R2689 Other abnormalities of gait and mobility: Secondary | ICD-10-CM

## 2022-06-29 NOTE — Therapy (Signed)
OUTPATIENT PHYSICAL THERAPY LOWER EXTREMITY TREATMENT   Patient Name: Jimmy Palmer MRN: [id number] DOB:[birth year], 81 y.o., male Today's Date: 06/29/2022   PT End of Session - 06/29/22 1303     Visit Number 8    Number of Visits 16    Date for PT Re-Evaluation 07/20/22    PT Start Time 1300    PT Stop Time 1341    PT Time Calculation (min) 41 min    Activity Tolerance Patient tolerated treatment well;No increased pain    Behavior During Therapy [facility] for tasks assessed/performed                  Past Medical History:  Diagnosis Date   Abnormal electrocardiogram    Abnormal heart sounds    Allergy 2020   upon arrival to [facility]   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   Chronic kidney disease 11/07/2021   Depression 2020   receiving appropriate therapy   Herniated lumbar intervertebral disc    Hypertension    Keratosis    benign   Left bundle branch block    Pacemaker    Pure hypercholesterolemia    Sick sinus syndrome (HCC)    Sleep apnea    VT (ventricular tachycardia) ([place])    Past Surgical History:  Procedure Laterality Date   CARDIOVERSION N/A 09/28/2020   Procedure: CARDIOVERSION;  Surgeon: Nahser, Wonda Cheng, MD;  Location: [place];  Service: Cardiovascular;  Laterality: N/A;   INSERT / REPLACE / REMOVE PACEMAKER     JOINT REPLACEMENT     partial right knee, medial side   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   Procedure: LEFT TOTAL HIP ARTHROPLASTY ANTERIOR APPROACH;  Surgeon: Mcarthur Rossetti, MD;  [facility];  Service: Orthopedics;  Laterality: Left;   VASECTOMY     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   Rectal bleeding 02/01/2022   Left hip pain 02/01/2022   Environmental and seasonal allergies 02/01/2022   Hypertension 11/07/2021   Chronic kidney disease 11/07/2021   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   Atrial  tachycardia ([place]) 08/26/2020   Pacemaker - MDT 08/26/2020   Persistent atrial fibrillation ([place]) 12/22/2019    PCP: [name], [name], MD  REFERRING PROVIDER: [name], [name], MD  REFERRING DIAG: [phone] (ICD-10-CM) - Status post total replacement of left hip  THERAPY DIAG:  Pain in left hip  Status post total replacement of left hip  Other abnormalities of gait and mobility  Stiffness of left hip, not elsewhere classified  Rationale for Evaluation and Treatment Rehabilitation  ONSET DATE: May 05, 2022  SUBJECTIVE:   SUBJECTIVE STATEMENT:  Heel lift is doing well. Still just hurting in the morning.    PERTINENT HISTORY: CKD, HTN, Pacemaker, A-fib  PAIN:  Are you having pain? Yes: NPRS scale: 3/10 Pain location: Generalized hip Pain description: Persistent ache anterolateral hip, posterior spasms Aggravating factors: Prolonged positioning, bending, lifting, stairs Relieving factors: Rest, Tylenol, heat  PRECAUTIONS: Anterior hip  WEIGHT BEARING RESTRICTIONS No  FALLS:  Has patient fallen in last 6 months? No  LIVING ENVIRONMENT: Lives with: lives with their spouse Lives in: House/apartment Stairs: Yes: External: 3 steps; on right going up Has following equipment at home: Single point cane - since surgery  OCCUPATION: Retired  PLOF: Independent  PATIENT GOALS  Wants  to get out for yard work and community activity.   OBJECTIVE:   DIAGNOSTIC FINDINGS: nothing post-op   PATIENT SURVEYS:  FOTO 43% ability  68% expected in 13 visits    COGNITION:  Overall cognitive status: Within functional limits for tasks assessed     SENSATION: WFL  EDEMA:   No edema noted.  POSTURE: No Significant postural limitations   PALPATION: Mild scar tissue around the scar.   LOWER EXTREMITY ROM:  Passive ROM Right eval Left eval  Hip flexion  115 no pain   Hip extension    Hip abduction    Hip adduction    Hip internal rotation  Not pushed to end range  but pain free   Hip external rotation  Not pushed to end range but pain free  Knee flexion    Knee extension    Ankle dorsiflexion    Ankle plantarflexion    Ankle inversion    Ankle eversion     (Blank rows = not tested)  LOWER EXTREMITY MMT:  MMT Right eval Left eval  Hip flexion 36 25  Hip extension    Hip abduction 60.5 38.5  Hip adduction 40.8 49.8  Hip internal rotation    Hip external rotation    Knee flexion    Knee extension    Ankle dorsiflexion    Ankle plantarflexion    Ankle inversion    Ankle eversion     (Blank rows = not tested)    TODAY'S TREATMENT:  8/3: MANUAL: STM to Lt piriformis, glut med/min/TFL Clams Sidelying hip abd & circles Machines in gym: leg press, knee ext, Hs curl, Xride  8/2 MANUAL: prone bil gluts, piriformis, TFL; anterior hip on the left  LTR x20  Bridging x10  Hip hip abduction 2x10 red with cuing   Standing low march x10 each leg  Standing hip abduction 2x10 each leg Standing hip extension 2x10 each leg   Nu-step 5 min L3      PATIENT EDUCATION:  Education details: aquatic PT, used spine for explanation of anatomy of condition, heel lift & plan Person educated: Patient Education method: Explanation Education comprehension: verbalized understanding   HOME EXERCISE PROGRAM: Access Code: [email or web address] URL: [email or web address]   ASSESSMENT:  CLINICAL IMPRESSION:  Pt verbalized feeling more confident after practicing use of gym machines today. Spasm in hip notable with continued weakness.   OBJECTIVE IMPAIRMENTS decreased activity tolerance, decreased balance, decreased endurance, difficulty walking, decreased ROM, decreased strength, and increased muscle spasms.   ACTIVITY LIMITATIONS lifting, bending, squatting, and stairs  PARTICIPATION LIMITATIONS: occupation and yard work  PERSONAL FACTORS Age, Past/current experiences, and 3+ comorbidities: pacemaker, uka right side, recent sciatica   are  also affecting patient's functional outcome.   REHAB POTENTIAL: Good  CLINICAL DECISION MAKING: Evolving/moderate complexity  EVALUATION COMPLEXITY: Moderate   GOALS: Goals reviewed with patient? Yes  SHORT TERM GOALS: Target date: 06/22/2022  Patient will increase gross strength of left hip by 5 lbs  Baseline: Goal status: not assessed  2.  Patient will demonstrate full pain free AROM of the left hip  Baseline:  Goal status: achieved  3.  Patient will be independent with base exercise program  Baseline:  Goal status: achieved  4.  Patient will ambulate 300' without an AD  Baseline: I do use a cane to get out of bed first thing in the AM- after I sit with heat I am good to go Goal status: partially met  LONG TERM GOALS: Target date:  07/20/2022   Patient will go up and down steps without increased pain  Baseline:  Goal status: INITIAL  2.  Patient will perform safe yard work tasks without pain  Baseline:  Goal status: INITIAL  3.  Patient will walk community distances without increased pain  Baseline:  Goal status: INITIAL   PLAN: PT FREQUENCY: 2x/week  PT DURATION: 8 weeks  PLANNED INTERVENTIONS: Therapeutic exercises, Therapeutic activity, Neuromuscular re-education, Balance training, Gait training, Patient/Family education, Joint mobilization, Stair training, Aquatic Therapy, Dry Needling, Spinal mobilization, Cryotherapy, Moist heat, scar mobilization, and Ionotophoresis 18m/ml Dexamethasone  PLAN FOR NEXT SESSION: strength measurements to address STG #1  Saxon Crosby C. Dagen Beevers PT, DPT 06/29/22 1:43 PM

## 2022-07-04 ENCOUNTER — Ambulatory Visit (HOSPITAL_BASED_OUTPATIENT_CLINIC_OR_DEPARTMENT_OTHER): Payer: Medicare Other | Admitting: Physical Therapy

## 2022-07-04 DIAGNOSIS — Z96642 Presence of left artificial hip joint: Secondary | ICD-10-CM

## 2022-07-04 DIAGNOSIS — R2689 Other abnormalities of gait and mobility: Secondary | ICD-10-CM

## 2022-07-04 DIAGNOSIS — M25552 Pain in left hip: Secondary | ICD-10-CM | POA: Diagnosis not present

## 2022-07-04 DIAGNOSIS — M25652 Stiffness of left hip, not elsewhere classified: Secondary | ICD-10-CM

## 2022-07-04 NOTE — Therapy (Signed)
OUTPATIENT PHYSICAL THERAPY LOWER EXTREMITY TREATMENT   Patient Name: Jimmy Palmer MRN: [id number] DOB:Feb 05, 1941, 81 y.o., male Today's Date: 07/05/2022   PT End of Session - 07/04/22 1443     Visit Number 9    Number of Visits 16    Date for PT Re-Evaluation 07/20/22    PT Start Time 5732    PT Stop Time 1433    PT Time Calculation (min) 45 min    Activity Tolerance Patient tolerated treatment well;No increased pain    Behavior During Therapy [facility] for tasks assessed/performed                Past Medical History:  Diagnosis Date   Abnormal electrocardiogram    Abnormal heart sounds    Allergy 2020   upon arrival to [facility]   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   Chronic kidney disease 11/07/2021   Depression 2020   receiving appropriate therapy   Herniated lumbar intervertebral disc    Hypertension    Keratosis    benign   Left bundle branch block    Pacemaker    Pure hypercholesterolemia    Sick sinus syndrome (HCC)    Sleep apnea    VT (ventricular tachycardia) ([place])    Past Surgical History:  Procedure Laterality Date   CARDIOVERSION N/A 09/28/2020   Procedure: CARDIOVERSION;  Surgeon: Nahser, Wonda Cheng, MD;  Location: [place];  Service: Cardiovascular;  Laterality: N/A;   INSERT / REPLACE / REMOVE PACEMAKER     JOINT REPLACEMENT     partial right knee, medial side   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   Procedure: LEFT TOTAL HIP ARTHROPLASTY ANTERIOR APPROACH;  Surgeon: Mcarthur Rossetti, MD;  [facility];  Service: Orthopedics;  Laterality: Left;   VASECTOMY     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   Rectal bleeding 02/01/2022   Left hip pain 02/01/2022   Environmental and seasonal allergies 02/01/2022   Hypertension 11/07/2021   Chronic kidney disease 11/07/2021   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   Atrial  tachycardia ([place]) 08/26/2020   Pacemaker - MDT 08/26/2020   Persistent atrial fibrillation ([place]) 12/22/2019    PCP: [name], [name], MD  REFERRING PROVIDER: Mcarthur Rossetti  REFERRING DIAG: K02.542 (ICD-10-CM) - Status post total replacement of left hip  THERAPY DIAG:  Pain in left hip  Status post total replacement of left hip  Other abnormalities of gait and mobility  Stiffness of left hip, not elsewhere classified  Rationale for Evaluation and Treatment Rehabilitation  ONSET DATE: May 05, 2022  SUBJECTIVE:   SUBJECTIVE STATEMENT:  Pt is 8 weeks and 4 days s/p L anterior THA.  Pt states he performed gym exercises last Rx and wants to go over those exercises today.  Pt states he felt better after prior Rx.  Pt states he has mm spasms in bilat glutes when he first gets up.  Pt uses heat which helps.  Pt was prescribed mm relaxers though reports they didn't help.      PERTINENT HISTORY: CKD, HTN, Pacemaker, A-fib  PAIN:  Are you having pain? Yes: NPRS scale: 3/10 Pain location: L hip Pain description: Persistent ache anterolateral hip, posterior spasms Aggravating factors: Prolonged positioning, bending, lifting, stairs Relieving factors: Rest, Tylenol, heat  PRECAUTIONS: Anterior hip  WEIGHT BEARING  RESTRICTIONS No  FALLS:  Has patient fallen in last 6 months? No  LIVING ENVIRONMENT: Lives with: lives with their spouse Lives in: House/apartment Stairs: Yes: External: 3 steps; on right going up Has following equipment at home: Single point cane - since surgery  OCCUPATION: Retired  PLOF: Independent  PATIENT GOALS  Wants to get out for yard work and community activity.   OBJECTIVE:   DIAGNOSTIC FINDINGS: nothing post-op   PATIENT SURVEYS:  FOTO 43% ability  68% expected in 13 visits    COGNITION:  Overall cognitive status: Within functional limits for tasks assessed     SENSATION: WFL  EDEMA:   No edema noted.  POSTURE: No  Significant postural limitations   PALPATION: Mild scar tissue around the scar.   LOWER EXTREMITY ROM:  Passive ROM Right eval Left eval  Hip flexion  115 no pain   Hip extension    Hip abduction    Hip adduction    Hip internal rotation  Not pushed to end range but pain free   Hip external rotation  Not pushed to end range but pain free  Knee flexion    Knee extension    Ankle dorsiflexion    Ankle plantarflexion    Ankle inversion    Ankle eversion     (Blank rows = not tested)  LOWER EXTREMITY MMT:  MMT Right eval Left eval  Hip flexion 36 25  Hip extension    Hip abduction 60.5 38.5  Hip adduction 40.8 49.8  Hip internal rotation    Hip external rotation    Knee flexion    Knee extension    Ankle dorsiflexion    Ankle plantarflexion    Ankle inversion    Ankle eversion     (Blank rows = not tested)    TODAY'S TREATMENT: Nu-step 5 min L3  PT reviewed his gym program, answered pt's questions, and educated pt in proper set up of machines. Pt performed life fitness leg press at 25#, life fitness knee ext at 25#, life fitness HS curl at 25#. Standing hip abduction 2x10 each leg Standing hip hike with UE support x 10 reps bilat  MANUAL: pt received STM to Lt piriformis and glute in R S/L'ing with pillow b/w knees    PATIENT EDUCATION:  Education details: HEP/gym program.  Proper set up of machines.  Educated pt in avoiding irritation of anterior hip.   Exercise form and rational of Rx Person educated: Patient Education method: Explanation Education comprehension: verbalized understanding   HOME EXERCISE PROGRAM: Access Code: [email or web address] URL: [email or web address]   ASSESSMENT:  CLINICAL IMPRESSION: Pt brought in his written gym program from PT.  PT reviewed his gym program and answered pt's questions.  PT educated pt in proper set up of machines.  Pt performed exercises well with instruction and cuing for form and set up.  Pt has trigger  points and tenderness in L glute.  Pt was very appreciative of skilled PT services.  He responded well to Rx stating he felt good after Rx and had no c/o's.    OBJECTIVE IMPAIRMENTS decreased activity tolerance, decreased balance, decreased endurance, difficulty walking, decreased ROM, decreased strength, and increased muscle spasms.   ACTIVITY LIMITATIONS lifting, bending, squatting, and stairs  PARTICIPATION LIMITATIONS: occupation and yard work  PERSONAL FACTORS Age, Past/current experiences, and 3+ comorbidities: pacemaker, uka right side, recent sciatica   are also affecting patient's functional outcome.   REHAB POTENTIAL: Good  CLINICAL DECISION MAKING: Evolving/moderate complexity  EVALUATION COMPLEXITY: Moderate   GOALS: Goals reviewed with patient? Yes  SHORT TERM GOALS: Target date: 06/22/2022  Patient will increase gross strength of left hip by 5 lbs  Baseline: Goal status: not assessed  2.  Patient will demonstrate full pain free AROM of the left hip  Baseline:  Goal status: achieved  3.  Patient will be independent with base exercise program  Baseline:  Goal status: achieved  4.  Patient will ambulate 300' without an AD  Baseline: I do use a cane to get out of bed first thing in the AM- after I sit with heat I am good to go Goal status: partially met  LONG TERM GOALS: Target date: 07/20/2022   Patient will go up and down steps without increased pain  Baseline:  Goal status: INITIAL  2.  Patient will perform safe yard work tasks without pain  Baseline:  Goal status: INITIAL  3.  Patient will walk community distances without increased pain  Baseline:  Goal status: INITIAL   PLAN: PT FREQUENCY: 2x/week  PT DURATION: 8 weeks  PLANNED INTERVENTIONS: Therapeutic exercises, Therapeutic activity, Neuromuscular re-education, Balance training, Gait training, Patient/Family education, Joint mobilization, Stair training, Aquatic Therapy, Dry Needling, Spinal  mobilization, Cryotherapy, Moist heat, scar mobilization, and Ionotophoresis 54m/ml Dexamethasone  PLAN FOR NEXT SESSION: strength measurements to address STG #1.  Cont with strengthening per THA protocol.  Cont with STM.    RSelinda MichaelsIII PT, DPT 07/05/22 9:22 AM

## 2022-07-05 ENCOUNTER — Other Ambulatory Visit (HOSPITAL_BASED_OUTPATIENT_CLINIC_OR_DEPARTMENT_OTHER): Payer: Self-pay | Admitting: Family Medicine

## 2022-07-05 ENCOUNTER — Encounter (HOSPITAL_BASED_OUTPATIENT_CLINIC_OR_DEPARTMENT_OTHER): Payer: Self-pay | Admitting: Physical Therapy

## 2022-07-11 ENCOUNTER — Encounter (HOSPITAL_BASED_OUTPATIENT_CLINIC_OR_DEPARTMENT_OTHER): Payer: Self-pay | Admitting: Physical Therapy

## 2022-07-11 ENCOUNTER — Ambulatory Visit (HOSPITAL_BASED_OUTPATIENT_CLINIC_OR_DEPARTMENT_OTHER): Payer: Medicare Other | Admitting: Physical Therapy

## 2022-07-11 DIAGNOSIS — R2689 Other abnormalities of gait and mobility: Secondary | ICD-10-CM

## 2022-07-11 DIAGNOSIS — Z96642 Presence of left artificial hip joint: Secondary | ICD-10-CM

## 2022-07-11 DIAGNOSIS — M25552 Pain in left hip: Secondary | ICD-10-CM

## 2022-07-11 DIAGNOSIS — M25652 Stiffness of left hip, not elsewhere classified: Secondary | ICD-10-CM

## 2022-07-11 NOTE — Therapy (Signed)
OUTPATIENT PHYSICAL THERAPY LOWER EXTREMITY TREATMENT  Progress Note Reporting Period  05/25/2022 to 8/152023  See note below for Objective Data and Assessment of Progress/Goals.       Patient Name: Jimmy Palmer MRN: [id number] DOB:13-Jun-1941, 81 y.o., male Today's Date: 07/11/2022   PT End of Session - 07/11/22 1457     Visit Number 10    Number of Visits 22    Date for PT Re-Evaluation 08/22/22    Authorization Type progress note done at 20    PT Start Time 1345    PT Stop Time 1428    PT Time Calculation (min) 43 min    Activity Tolerance Patient tolerated treatment well;No increased pain    Behavior During Therapy [facility] for tasks assessed/performed                 Past Medical History:  Diagnosis Date   Abnormal electrocardiogram    Abnormal heart sounds    Allergy 2020   upon arrival to [facility]   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   Chronic kidney disease 11/07/2021   Depression 2020   receiving appropriate therapy   Herniated lumbar intervertebral disc    Hypertension    Keratosis    benign   Left bundle branch block    Pacemaker    Pure hypercholesterolemia    Sick sinus syndrome (HCC)    Sleep apnea    VT (ventricular tachycardia) ([place])    Past Surgical History:  Procedure Laterality Date   CARDIOVERSION N/A 09/28/2020   Procedure: CARDIOVERSION;  Surgeon: Nahser, Wonda Cheng, MD;  Location: [place];  Service: Cardiovascular;  Laterality: N/A;   INSERT / REPLACE / REMOVE PACEMAKER     JOINT REPLACEMENT     partial right knee, medial side   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   Procedure: LEFT TOTAL HIP ARTHROPLASTY ANTERIOR APPROACH;  Surgeon: Mcarthur Rossetti, MD;  [facility];  Service: Orthopedics;  Laterality: Left;   VASECTOMY     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   Rectal bleeding 02/01/2022   Left hip pain  02/01/2022   Environmental and seasonal allergies 02/01/2022   Hypertension 11/07/2021   Chronic kidney disease 11/07/2021   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   Atrial tachycardia ([place]) 08/26/2020   Pacemaker - MDT 08/26/2020   Persistent atrial fibrillation ([place]) 12/22/2019    PCP: [name], [name], MD  REFERRING PROVIDER: DR Jean Rosenthal   REFERRING DIAG: [phone] (ICD-10-CM) - Status post total replacement of left hip  THERAPY DIAG:  Pain in left hip  Status post total replacement of left hip  Other abnormalities of gait and mobility  Stiffness of left hip, not elsewhere classified  Rationale for Evaluation and Treatment Rehabilitation  ONSET DATE: May 05, 2022  SUBJECTIVE:   SUBJECTIVE STATEMENT: The patient has been taking hemp pills and vitamin D. He feels like it is reducing his morning stiffness. He has not needed to use a can in the morning.  PERTINENT HISTORY: CKD, HTN, Pacemaker, A-fib  PAIN:  Are you having pain? Yes: NPRS scale: 3/10 Pain location: L hip Pain description: Persistent ache anterolateral hip, posterior spasms Aggravating factors: Prolonged positioning, bending, lifting, stairs Relieving factors: Rest, Tylenol, heat  PRECAUTIONS: Anterior hip  WEIGHT BEARING RESTRICTIONS No  FALLS:  Has patient fallen  in last 6 months? No  LIVING ENVIRONMENT: Lives with: lives with their spouse Lives in: House/apartment Stairs: Yes: External: 3 steps; on right going up Has following equipment at home: Single point cane - since surgery  OCCUPATION: Retired  PLOF: Independent  PATIENT GOALS  Wants to get out for yard work and community activity.   OBJECTIVE:   DIAGNOSTIC FINDINGS: nothing post-op   PATIENT SURVEYS:  FOTO 43% ability  68% expected in 13 visits    COGNITION:  Overall cognitive status: Within functional limits for tasks assessed     SENSATION: WFL  EDEMA:   No edema noted.  POSTURE: No Significant  postural limitations   PALPATION: Mild scar tissue around the scar.   LOWER EXTREMITY ROM:  Passive ROM Right eval Left 8/15  Hip flexion  120 no pain   Hip extension    Hip abduction    Hip adduction    Hip internal rotation  Not pushed to end range but pain free   Hip external rotation  Not pushed to end range but pain free  Knee flexion    Knee extension    Ankle dorsiflexion    Ankle plantarflexion    Ankle inversion    Ankle eversion     (Blank rows = not tested)  LOWER EXTREMITY MMT:  MMT Right eval Left eval  Hip flexion 36 25  Hip extension    Hip abduction 60.5 38.5 not tested this visit   Hip adduction 40.8 49.8  Hip internal rotation    Hip external rotation    Knee flexion    Knee extension    Ankle dorsiflexion    Ankle plantarflexion    Ankle inversion    Ankle eversion     (Blank rows = not tested)  Gait 8/15: improved lateral movement with gait    TODAY'S TREATMENT: 8/15 Nu-step 5 min L3 Pt performed life fitness leg press at 25#, life fitness knee ext at 55#, life fitness HS curl at 25#.2x15            MANUAL: pt received STM to Lt piriformis and glute in R S/L'ing with pillow b/w knees Supine March 2x15  Bridge 2x15  Clamshell blue x20   Standing split stance weight shift x20 each leg     Last visit  Nu-step 5 min L3  PT reviewed his gym program, answered pt's questions, and educated pt in proper set up of machines. Pt performed life fitness leg press at 25#, life fitness knee ext at 25#, life fitness HS curl at 25#. Standing hip abduction 2x10 each leg Standing hip hike with UE support x 10 reps bilat  MANUAL: pt received STM to Lt piriformis and glute in R S/L'ing with pillow b/w knees    PATIENT EDUCATION:  Education details: HEP/gym program.  Proper set up of machines.  Educated pt in avoiding irritation of anterior hip.   Exercise form and rational of Rx Person educated: Patient Education method: Explanation Education  comprehension: verbalized understanding   HOME EXERCISE PROGRAM: Access Code: [email or web address] URL: [email or web address]   ASSESSMENT:  CLINICAL IMPRESSION: The patient is progressing well. He had a trigger point on the left and right, but it is much smaller and less sensitive. He was encouraged to continue strengthening. We worked on light hip flexor exercises.Therapy will continue to advance as tolerated.  Therapy perfromed a progress note on hi,. His spasming is improving. He is walking with less lateral perturbation. His range is pretty much full.  His strength is improving per visual inspection. He would benefit from further skilled therapy 2W6.  OBJECTIVE IMPAIRMENTS decreased activity tolerance, decreased balance, decreased endurance, difficulty walking, decreased ROM, decreased strength, and increased muscle spasms.   ACTIVITY LIMITATIONS lifting, bending, squatting, and stairs  PARTICIPATION LIMITATIONS: occupation and yard work  PERSONAL FACTORS Age, Past/current experiences, and 3+ comorbidities: pacemaker, uka right side, recent sciatica   are also affecting patient's functional outcome.   REHAB POTENTIAL: Good  CLINICAL DECISION MAKING: Evolving/moderate complexity  EVALUATION COMPLEXITY: Moderate   GOALS: Goals reviewed with patient? Yes  SHORT TERM GOALS: Target date: 06/22/2022  Patient will increase gross strength of left hip by 5 lbs  Baseline: Goal status: not assessed  2.  Patient will demonstrate full pain free AROM of the left hip  Baseline:  Goal status: achieved  3.  Patient will be independent with base exercise program  Baseline:  Goal status: achieved  4.  Patient will ambulate 300' without an AD  Baseline: I do use a cane to get out of bed first thing in the AM- after I sit with heat I am good to go Goal status: partially met  LONG TERM GOALS: Target date: 07/20/2022   Patient will go up and down steps without increased pain   Baseline:  Goal status: INITIAL  2.  Patient will perform safe yard work tasks without pain  Baseline:  Goal status: INITIAL  3.  Patient will walk community distances without increased pain  Baseline:  Goal status: INITIAL   PLAN: PT FREQUENCY: 2x/week  PT DURATION: 8 weeks  PLANNED INTERVENTIONS: Therapeutic exercises, Therapeutic activity, Neuromuscular re-education, Balance training, Gait training, Patient/Family education, Joint mobilization, Stair training, Aquatic Therapy, Dry Needling, Spinal mobilization, Cryotherapy, Moist heat, scar mobilization, and Ionotophoresis 4mg /ml Dexamethasone  PLAN FOR NEXT SESSION: strength measurements to address STG #1.  Cont with strengthening per THA protocol.  Cont with STM.    Carolyne Littles PT DPT  07/11/22 4:38 PM

## 2022-07-13 ENCOUNTER — Ambulatory Visit (HOSPITAL_BASED_OUTPATIENT_CLINIC_OR_DEPARTMENT_OTHER): Payer: Medicare Other | Admitting: Physical Therapy

## 2022-07-13 ENCOUNTER — Encounter (HOSPITAL_BASED_OUTPATIENT_CLINIC_OR_DEPARTMENT_OTHER): Payer: Self-pay | Admitting: Physical Therapy

## 2022-07-13 DIAGNOSIS — Z96642 Presence of left artificial hip joint: Secondary | ICD-10-CM

## 2022-07-13 DIAGNOSIS — M25552 Pain in left hip: Secondary | ICD-10-CM

## 2022-07-13 DIAGNOSIS — M25652 Stiffness of left hip, not elsewhere classified: Secondary | ICD-10-CM

## 2022-07-13 DIAGNOSIS — R2689 Other abnormalities of gait and mobility: Secondary | ICD-10-CM

## 2022-07-13 NOTE — Therapy (Signed)
OUTPATIENT PHYSICAL THERAPY LOWER EXTREMITY TREATMENT  Progress Note Reporting Period  05/25/2022 to 8/152023  See note below for Objective Data and Assessment of Progress/Goals.       Patient Name: Jimmy Palmer MRN: [id number] DOB:1941/09/03, 81 y.o., male Today's Date: 07/14/2022   PT End of Session - 07/13/22 1359     Visit Number 11    Number of Visits 22    Date for PT Re-Evaluation 08/22/22    Authorization Type progress note done at 20    PT Start Time 1345    PT Stop Time 1426    PT Time Calculation (min) 41 min    Activity Tolerance Patient tolerated treatment well;No increased pain    Behavior During Therapy [facility] for tasks assessed/performed                  Past Medical History:  Diagnosis Date   Abnormal electrocardiogram    Abnormal heart sounds    Allergy 2020   upon arrival to [facility]   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   Chronic kidney disease 11/07/2021   Depression 2020   receiving appropriate therapy   Herniated lumbar intervertebral disc    Hypertension    Keratosis    benign   Left bundle branch block    Pacemaker    Pure hypercholesterolemia    Sick sinus syndrome (HCC)    Sleep apnea    VT (ventricular tachycardia) (HCC)    Past Surgical History:  Procedure Laterality Date   CARDIOVERSION N/A 09/28/2020   Procedure: CARDIOVERSION;  Surgeon: Nahser, Deloris Ping, MD;  [facility]: [facility];  Service: Cardiovascular;  Laterality: N/A;   INSERT / REPLACE / REMOVE PACEMAKER     JOINT REPLACEMENT     partial right knee, medial side   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   Procedure: LEFT TOTAL HIP ARTHROPLASTY ANTERIOR APPROACH;  Surgeon: Kathryne Hitch, MD;  Location: [facility];  Service: Orthopedics;  Laterality: Left;   VASECTOMY     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   Rectal bleeding 02/01/2022   Left hip  pain 02/01/2022   Environmental and seasonal allergies 02/01/2022   Hypertension 11/07/2021   Chronic kidney disease 11/07/2021   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   Atrial tachycardia (HCC) 08/26/2020   Pacemaker - MDT 08/26/2020   Persistent atrial fibrillation (HCC) 12/22/2019    PCP: de [place], [name], MD  REFERRING PROVIDER: DR Doneen Poisson   REFERRING DIAG: [phone] (ICD-10-CM) - Status post total replacement of left hip  THERAPY DIAG:  Pain in left hip  Status post total replacement of left hip  Other abnormalities of gait and mobility  Stiffness of left hip, not elsewhere classified  Rationale for Evaluation and Treatment Rehabilitation  ONSET DATE: May 05, 2022  SUBJECTIVE:   SUBJECTIVE STATEMENT: The patient reports a mild increase in spasming after the last session. He feels like the gym exercises may be a bit much at this time. We will focus more on functional strengthening. He was interested in the gym activity so he can get back to the gym.  PERTINENT HISTORY: CKD, HTN, Pacemaker, A-fib  PAIN:  Are you having pain? Yes: NPRS scale: 3/10 Pain location: L hip Pain description: Persistent ache anterolateral hip, posterior spasms Aggravating factors: Prolonged positioning, bending, lifting, stairs Relieving  factors: Rest, Tylenol, heat  PRECAUTIONS: Anterior hip  WEIGHT BEARING RESTRICTIONS No  FALLS:  Has patient fallen in last 6 months? No  LIVING ENVIRONMENT: Lives with: lives with their spouse Lives in: House/apartment Stairs: Yes: External: 3 steps; on right going up Has following equipment at home: Single point cane - since surgery  OCCUPATION: Retired  PLOF: Independent  PATIENT GOALS  Wants to get out for yard work and community activity.   OBJECTIVE:   DIAGNOSTIC FINDINGS: nothing post-op   PATIENT SURVEYS:  FOTO 43% ability  68% expected in 13 visits    COGNITION:  Overall cognitive status: Within functional  limits for tasks assessed     SENSATION: WFL  EDEMA:   No edema noted.  POSTURE: No Significant postural limitations   PALPATION: Mild scar tissue around the scar.   LOWER EXTREMITY ROM:  Passive ROM Right eval Left 8/15  Hip flexion  120 no pain   Hip extension    Hip abduction    Hip adduction    Hip internal rotation  Not pushed to end range but pain free   Hip external rotation  Not pushed to end range but pain free  Knee flexion    Knee extension    Ankle dorsiflexion    Ankle plantarflexion    Ankle inversion    Ankle eversion     (Blank rows = not tested)  LOWER EXTREMITY MMT:  MMT Right eval Left eval  Hip flexion 36 25  Hip extension    Hip abduction 60.5 38.5 not tested this visit   Hip adduction 40.8 49.8  Hip internal rotation    Hip external rotation    Knee flexion    Knee extension    Ankle dorsiflexion    Ankle plantarflexion    Ankle inversion    Ankle eversion     (Blank rows = not tested)  Gait 8/15: improved lateral movement with gait    TODAY'S TREATMENT: 8/18 Nu-step 5 min L3 Supine March 2x15  Bridge 2x15  Clamshell blue x20   Step up/ lateral step up 4 inch 2x10  Heel raise x20  Standing low march 2x10   8/15 Nu-step 5 min L3 Pt performed life fitness leg press at 25#, life fitness knee ext at 55#, life fitness HS curl at 25#.2x15            MANUAL: pt received STM to Lt piriformis and glute in R S/L'ing with pillow b/w knees Supine March 2x15  Bridge 2x15  Clamshell blue x20   Standing split stance weight shift x20 each leg     Last visit  Nu-step 5 min L3  PT reviewed his gym program, answered pt's questions, and educated pt in proper set up of machines. Pt performed life fitness leg press at 25#, life fitness knee ext at 25#, life fitness HS curl at 25#. Standing hip abduction 2x10 each leg Standing hip hike with UE support x 10 reps bilat  MANUAL: pt received STM to Lt piriformis and glute in R S/L'ing  with pillow b/w knees    PATIENT EDUCATION:  Education details: HEP/gym program.  Proper set up of machines.  Educated pt in avoiding irritation of anterior hip.   Exercise form and rational of Rx Person educated: Patient Education method: Explanation Education comprehension: verbalized understanding   HOME EXERCISE PROGRAM: Access Code: [email or web address] URL: [email or web address]   ASSESSMENT:  CLINICAL IMPRESSION: The patient's trigger point in his gluteal was slightly tighter today on  the right side. We kept his exercises more basic and we focused on stair training. Overall he is doing well. He continues to have pain in the monring.   OBJECTIVE IMPAIRMENTS decreased activity tolerance, decreased balance, decreased endurance, difficulty walking, decreased ROM, decreased strength, and increased muscle spasms.   ACTIVITY LIMITATIONS lifting, bending, squatting, and stairs  PARTICIPATION LIMITATIONS: occupation and yard work  PERSONAL FACTORS Age, Past/current experiences, and 3+ comorbidities: pacemaker, uka right side, recent sciatica   are also affecting patient's functional outcome.   REHAB POTENTIAL: Good  CLINICAL DECISION MAKING: Evolving/moderate complexity  EVALUATION COMPLEXITY: Moderate   GOALS: Goals reviewed with patient? Yes  SHORT TERM GOALS: Target date: 06/22/2022  Patient will increase gross strength of left hip by 5 lbs  Baseline: Goal status: not assessed  2.  Patient will demonstrate full pain free AROM of the left hip  Baseline:  Goal status: achieved  3.  Patient will be independent with base exercise program  Baseline:  Goal status: achieved  4.  Patient will ambulate 300' without an AD  Baseline: I do use a cane to get out of bed first thing in the AM- after I sit with heat I am good to go Goal status: partially met  LONG TERM GOALS: Target date: 07/20/2022   Patient will go up and down steps without increased pain  Baseline:   Goal status: INITIAL  2.  Patient will perform safe yard work tasks without pain  Baseline:  Goal status: INITIAL  3.  Patient will walk community distances without increased pain  Baseline:  Goal status: INITIAL   PLAN: PT FREQUENCY: 2x/week  PT DURATION: 8 weeks  PLANNED INTERVENTIONS: Therapeutic exercises, Therapeutic activity, Neuromuscular re-education, Balance training, Gait training, Patient/Family education, Joint mobilization, Stair training, Aquatic Therapy, Dry Needling, Spinal mobilization, Cryotherapy, Moist heat, scar mobilization, and Ionotophoresis 4mg /ml Dexamethasone  PLAN FOR NEXT SESSION: strength measurements to address STG #1.  Cont with strengthening per THA protocol.  Cont with STM.    Carolyne Littles PT DPT  07/14/22 7:57 AM

## 2022-07-14 ENCOUNTER — Encounter (HOSPITAL_BASED_OUTPATIENT_CLINIC_OR_DEPARTMENT_OTHER): Payer: Self-pay | Admitting: Physical Therapy

## 2022-07-18 ENCOUNTER — Encounter (HOSPITAL_BASED_OUTPATIENT_CLINIC_OR_DEPARTMENT_OTHER): Payer: Self-pay | Admitting: Physical Therapy

## 2022-07-18 ENCOUNTER — Ambulatory Visit (HOSPITAL_BASED_OUTPATIENT_CLINIC_OR_DEPARTMENT_OTHER): Payer: Medicare Other | Admitting: Physical Therapy

## 2022-07-18 ENCOUNTER — Other Ambulatory Visit (HOSPITAL_BASED_OUTPATIENT_CLINIC_OR_DEPARTMENT_OTHER): Payer: Self-pay | Admitting: Family Medicine

## 2022-07-18 DIAGNOSIS — Z96642 Presence of left artificial hip joint: Secondary | ICD-10-CM

## 2022-07-18 DIAGNOSIS — M25552 Pain in left hip: Secondary | ICD-10-CM | POA: Diagnosis not present

## 2022-07-18 DIAGNOSIS — R2689 Other abnormalities of gait and mobility: Secondary | ICD-10-CM

## 2022-07-18 DIAGNOSIS — M25652 Stiffness of left hip, not elsewhere classified: Secondary | ICD-10-CM

## 2022-07-18 NOTE — Therapy (Signed)
OUTPATIENT PHYSICAL THERAPY LOWER EXTREMITY TREATMENT     Patient Name: Jimmy Palmer MRN: [id number] DOB:03-06-1941, 81 y.o., male Today's Date: 07/18/2022   PT End of Session - 07/18/22 1319     Visit Number 12    Number of Visits 22    Date for PT Re-Evaluation 08/22/22    Authorization Type progress note done at 20    PT Start Time 6644    PT Stop Time 1359    PT Time Calculation (min) 42 min    Activity Tolerance Patient tolerated treatment well;No increased pain    Behavior During Therapy [facility] for tasks assessed/performed                  Past Medical History:  Diagnosis Date   Abnormal electrocardiogram    Abnormal heart sounds    Allergy 2020   upon arrival to [facility]   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   Chronic kidney disease 11/07/2021   Depression 2020   receiving appropriate therapy   Herniated lumbar intervertebral disc    Hypertension    Keratosis    benign   Left bundle branch block    Pacemaker    Pure hypercholesterolemia    Sick sinus syndrome (HCC)    Sleep apnea    VT (ventricular tachycardia) ([place] [address])    Past Surgical History:  Procedure Laterality Date   CARDIOVERSION N/A 09/28/2020   Procedure: CARDIOVERSION;  Surgeon: Nahser, Wonda Cheng, MD;  Location: [place];  Service: Cardiovascular;  Laterality: N/A;   INSERT / REPLACE / REMOVE PACEMAKER     JOINT REPLACEMENT     partial right knee, medial side   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   Procedure: LEFT TOTAL HIP ARTHROPLASTY ANTERIOR APPROACH;  Surgeon: Mcarthur Rossetti, MD;  Location: [facility];  Service: Orthopedics;  Laterality: Left;   VASECTOMY     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   Rectal bleeding 02/01/2022   Left hip pain 02/01/2022   Environmental and seasonal allergies 02/01/2022   Hypertension 11/07/2021   Chronic kidney disease 11/07/2021    Heart block AV complete ([place]) 08/26/2020   Atrial tachycardia ([place]) 08/26/2020   Pacemaker - MDT 08/26/2020   Persistent atrial fibrillation ([place]) 12/22/2019    PCP: [name], [name], MD  REFERRING PROVIDER: DR Jean Rosenthal   REFERRING DIAG: [phone] (ICD-10-CM) - Status post total replacement of left hip  THERAPY DIAG:  Pain in left hip  Status post total replacement of left hip  Other abnormalities of gait and mobility  Stiffness of left hip, not elsewhere classified  Rationale for Evaluation and Treatment Rehabilitation  ONSET DATE: May 05, 2022  SUBJECTIVE:   SUBJECTIVE STATEMENT: I feel like the spasming might be decreasing.   PERTINENT HISTORY: CKD, HTN, Pacemaker, A-fib  PAIN:  Are you having pain? Yes: NPRS scale: 3/10 Pain location: L hip Pain description: Persistent ache anterolateral hip, posterior spasms Aggravating factors: Prolonged positioning, bending, lifting, stairs Relieving factors: Rest, Tylenol, heat  PRECAUTIONS: Anterior hip  WEIGHT BEARING RESTRICTIONS No  FALLS:  Has patient fallen in last 6 months? No  LIVING ENVIRONMENT: Lives with: lives with their spouse Lives in: House/apartment Stairs: Yes: External: 3 steps; on right going up Has following equipment at home: Single point cane - since surgery  OCCUPATION: Retired  PLOF:  Independent  PATIENT GOALS  Wants to get out for yard work and community activity.   OBJECTIVE:   DIAGNOSTIC FINDINGS: nothing post-op   PATIENT SURVEYS:  FOTO 43% ability  68% expected in 13 visits    COGNITION:  Overall cognitive status: Within functional limits for tasks assessed     SENSATION: WFL  EDEMA:   No edema noted.  POSTURE: No Significant postural limitations   PALPATION: Mild scar tissue around the scar.   LOWER EXTREMITY ROM:  Passive ROM Right eval Left 8/15  Hip flexion  120 no pain   Hip extension    Hip abduction    Hip adduction    Hip internal  rotation  Not pushed to end range but pain free   Hip external rotation  Not pushed to end range but pain free  Knee flexion    Knee extension    Ankle dorsiflexion    Ankle plantarflexion    Ankle inversion    Ankle eversion     (Blank rows = not tested)  LOWER EXTREMITY MMT:  MMT Right eval Left eval  Hip flexion 36 25  Hip extension    Hip abduction 60.5 38.5 not tested this visit   Hip adduction 40.8 49.8  Hip internal rotation    Hip external rotation    Knee flexion    Knee extension    Ankle dorsiflexion    Ankle plantarflexion    Ankle inversion    Ankle eversion     (Blank rows = not tested)  Gait 8/15: improved lateral movement with gait    TODAY'S TREATMENT: 8/22: Manual trigge rpoint release to bilateral glut med & piriformis Bike 4 min L5 Shuttle- 3*25+12 double leg press; 2*25+6 single leg press Wall squat, also added adduction knee taps Tandem balance Seated figure 4   8/18 Nu-step 5 min L3 Supine March 2x15  Bridge 2x15  Clamshell blue x20   Step up/ lateral step up 4 inch 2x10  Heel raise x20  Standing low march 2x10     PATIENT EDUCATION:  Education details: HEP/gym program.  Proper set up of machines.  Educated pt in avoiding irritation of anterior hip.   Exercise form and rational of Rx Person educated: Patient Education method: Explanation Education comprehension: verbalized understanding   HOME EXERCISE PROGRAM: Access Code: [email or web address] URL: [email or web address]   ASSESSMENT:  CLINICAL IMPRESSION: Used shuttle for focused strengthening for sit<> stand, stairs etc. Reports he could feel the muscles firing and advised that he will likely be sore, to continue moving and use heat as needed.   OBJECTIVE IMPAIRMENTS decreased activity tolerance, decreased balance, decreased endurance, difficulty walking, decreased ROM, decreased strength, and increased muscle spasms.   ACTIVITY LIMITATIONS lifting, bending,  squatting, and stairs  PARTICIPATION LIMITATIONS: occupation and yard work  PERSONAL FACTORS Age, Past/current experiences, and 3+ comorbidities: pacemaker, uka right side, recent sciatica   are also affecting patient's functional outcome.   REHAB POTENTIAL: Good  CLINICAL DECISION MAKING: Evolving/moderate complexity  EVALUATION COMPLEXITY: Moderate   GOALS: Goals reviewed with patient? Yes  SHORT TERM GOALS: Target date: 06/22/2022  Patient will increase gross strength of left hip by 5 lbs  Baseline: Goal status: not assessed  2.  Patient will demonstrate full pain free AROM of the left hip  Baseline:  Goal status: achieved  3.  Patient will be independent with base exercise program  Baseline:  Goal status: achieved  4.  Patient will ambulate 300' without an AD  Baseline:  I do use a cane to get out of bed first thing in the AM- after I sit with heat I am good to go Goal status: partially met  LONG TERM GOALS: Target date: 07/20/2022   Patient will go up and down steps without increased pain  Baseline:  Goal status: INITIAL  2.  Patient will perform safe yard work tasks without pain  Baseline:  Goal status: INITIAL  3.  Patient will walk community distances without increased pain  Baseline:  Goal status: INITIAL   PLAN: PT FREQUENCY: 2x/week  PT DURATION: 8 weeks  PLANNED INTERVENTIONS: Therapeutic exercises, Therapeutic activity, Neuromuscular re-education, Balance training, Gait training, Patient/Family education, Joint mobilization, Stair training, Aquatic Therapy, Dry Needling, Spinal mobilization, Cryotherapy, Moist heat, scar mobilization, and Ionotophoresis 4mg /ml Dexamethasone  PLAN FOR NEXT SESSION: strength measurements to address STG #1.  Re-eval LTGs   Nasir Bright C. Ruqayya Ventress PT, DPT 07/18/22 2:09 PM

## 2022-07-19 ENCOUNTER — Other Ambulatory Visit (HOSPITAL_BASED_OUTPATIENT_CLINIC_OR_DEPARTMENT_OTHER): Payer: Self-pay | Admitting: Family Medicine

## 2022-07-20 ENCOUNTER — Ambulatory Visit (HOSPITAL_BASED_OUTPATIENT_CLINIC_OR_DEPARTMENT_OTHER): Payer: Medicare Other | Admitting: Physical Therapy

## 2022-07-20 ENCOUNTER — Encounter (HOSPITAL_BASED_OUTPATIENT_CLINIC_OR_DEPARTMENT_OTHER): Payer: Self-pay | Admitting: Physical Therapy

## 2022-07-20 DIAGNOSIS — Z96642 Presence of left artificial hip joint: Secondary | ICD-10-CM

## 2022-07-20 DIAGNOSIS — M25552 Pain in left hip: Secondary | ICD-10-CM | POA: Diagnosis not present

## 2022-07-20 NOTE — Therapy (Signed)
OUTPATIENT PHYSICAL THERAPY LOWER EXTREMITY TREATMENT     Patient Name: Jimmy Palmer MRN: [id number] DOB:05/22/1941, 81 y.o., male Today's Date: 07/20/2022   PT End of Session - 07/20/22 1433     Visit Number 13    Number of Visits 22    Date for PT Re-Evaluation 08/22/22    Authorization Type progress note done at 20    PT Start Time 1432    PT Stop Time 1515    PT Time Calculation (min) 43 min    Activity Tolerance Patient tolerated treatment well;No increased pain    Behavior During Therapy [facility] for tasks assessed/performed                   Past Medical History:  Diagnosis Date   Abnormal electrocardiogram    Abnormal heart sounds    Allergy 2020   upon arrival to [facility]   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   Chronic kidney disease 11/07/2021   Depression 2020   receiving appropriate therapy   Herniated lumbar intervertebral disc    Hypertension    Keratosis    benign   Left bundle branch block    Pacemaker    Pure hypercholesterolemia    Sick sinus syndrome (HCC)    Sleep apnea    VT (ventricular tachycardia) ([place])    Past Surgical History:  Procedure Laterality Date   CARDIOVERSION N/A 09/28/2020   Procedure: CARDIOVERSION;  Surgeon: Nahser, Wonda Cheng, MD;  Location: [place];  Service: Cardiovascular;  Laterality: N/A;   INSERT / REPLACE / REMOVE PACEMAKER     JOINT REPLACEMENT     partial right knee, medial side   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   Procedure: LEFT TOTAL HIP ARTHROPLASTY ANTERIOR APPROACH;  Surgeon: Mcarthur Rossetti, MD;  [facility];  Service: Orthopedics;  Laterality: Left;   VASECTOMY     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   Rectal bleeding 02/01/2022   Left hip pain 02/01/2022   Environmental and seasonal allergies 02/01/2022   Hypertension 11/07/2021   Chronic kidney disease 11/07/2021    Heart block AV complete ([place]) 08/26/2020   Atrial tachycardia ([place]) 08/26/2020   Pacemaker - MDT 08/26/2020   Persistent atrial fibrillation ([place]) 12/22/2019    PCP: [name], [name], MD  REFERRING PROVIDER: DR Jean Rosenthal   REFERRING DIAG: [phone] (ICD-10-CM) - Status post total replacement of left hip  THERAPY DIAG:  Pain in left hip  Status post total replacement of left hip  Rationale for Evaluation and Treatment Rehabilitation  ONSET DATE: May 05, 2022  SUBJECTIVE:   SUBJECTIVE STATEMENT: I sensed that I was a little less wobbly this morning. Sore with new exercises.   PERTINENT HISTORY: CKD, HTN, Pacemaker, A-fib  PAIN:  Are you having pain? Yes: NPRS scale: 3/10 Pain location: L hip Pain description: Persistent ache anterolateral hip, posterior spasms Aggravating factors: Prolonged positioning, bending, lifting, stairs Relieving factors: Rest, Tylenol, heat  PRECAUTIONS: Anterior hip  WEIGHT BEARING RESTRICTIONS No  FALLS:  Has patient fallen in last 6 months? No  LIVING ENVIRONMENT: Lives with: lives with their spouse Lives in: House/apartment Stairs: Yes: External: 3 steps; on right going up Has following equipment at home: Single point cane - since surgery  OCCUPATION: Retired  PLOF: Independent  PATIENT GOALS  Wants to  get out for yard work and community activity.   OBJECTIVE:   PATIENT SURVEYS:  FOTO 43% ability  68% expected in 13 visits   8/24: 57   POSTURE: No Significant postural limitations   PALPATION: Mild scar tissue around the scar.   LOWER EXTREMITY ROM:  Passive ROM Right eval Left 8/15  Hip flexion  120 no pain   Hip extension    Hip abduction    Hip adduction    Hip internal rotation  Not pushed to end range but pain free   Hip external rotation  Not pushed to end range but pain free  Knee flexion    Knee extension    Ankle dorsiflexion    Ankle plantarflexion    Ankle inversion    Ankle  eversion     (Blank rows = not tested)  LOWER EXTREMITY MMT:  MMT Right eval Left eval  Hip flexion 36 25  Hip extension    Hip abduction 60.5 38.5 not tested this visit   Hip adduction 40.8 49.8  Hip internal rotation    Hip external rotation    Knee flexion    Knee extension    Ankle dorsiflexion    Ankle plantarflexion    Ankle inversion    Ankle eversion     (Blank rows = not tested)  Gait 8/15: improved lateral movement with gait    TODAY'S TREATMENT: 8/24: Trigger Point Dry Needling, Manual Therapy Treatment:  Initial or subsequent education regarding Trigger Point Dry Needling: Initial Did patient give consent to treatment with Trigger Point Dry Needling: Yes TPDN with skilled palpation and monitoring followed by STM to the following muscles: Rt glut med/min, piriformis  Bike 10 min  Seated figure 4 stretch Seated HS stretch   8/22: Manual trigge rpoint release to bilateral glut med & piriformis Bike 4 min L5 Shuttle- 3*25+12 double leg press; 2*25+6 single leg press Wall squat, also added adduction knee taps Tandem balance Seated figure 4    PATIENT EDUCATION:  Education details: exercise form/rationale Person educated: Patient Education method: Explanation Education comprehension: verbalized understanding   HOME EXERCISE PROGRAM: Access Code: [email or web address] URL: [email or web address]   ASSESSMENT:  CLINICAL IMPRESSION: Decreased spasm noted in Rt hip following DN today. Advised that he may feel sore. Primary issue at this point is the AM spasm that limits early  mobility, will determine DN effects.   OBJECTIVE IMPAIRMENTS decreased activity tolerance, decreased balance, decreased endurance, difficulty walking, decreased ROM, decreased strength, and increased muscle spasms.   ACTIVITY LIMITATIONS lifting, bending, squatting, and stairs  PARTICIPATION LIMITATIONS: occupation and yard work  PERSONAL FACTORS Age, Past/current  experiences, and 3+ comorbidities: pacemaker, uka right side, recent sciatica   are also affecting patient's functional outcome.   REHAB POTENTIAL: Good  CLINICAL DECISION MAKING: Evolving/moderate complexity  EVALUATION COMPLEXITY: Moderate   GOALS: Goals reviewed with patient? Yes  SHORT TERM GOALS: Target date: 06/22/2022  Patient will increase gross strength of left hip by 5 lbs  Baseline: Goal status: not assessed  2.  Patient will demonstrate full pain free AROM of the left hip  Baseline:  Goal status: achieved  3.  Patient will be independent with base exercise program  Baseline:  Goal status: achieved  4.  Patient will ambulate 300' without an AD  Baseline: I do use a cane to get out of bed first thing in the AM- after I sit with heat I am good to go Goal status: partially met  LONG TERM GOALS:  Target date: 07/20/2022   Patient will go up and down steps without increased pain  Baseline: it doesn't feel easy but it's better Goal status: achieved  2.  Patient will perform safe yard work tasks without pain  Baseline: have a guy that comes now, will try again when it cools down Goal status: deferred for now  3.  Patient will walk community distances without increased pain  Baseline: as long as I use heat in the morning, I am good to go.  Goal status: partially met  4. No longer experiencing AM muscle spasm  Baseline: need heat every AM  Goal status: NEW   PLAN: PT FREQUENCY: 2x/week  PT DURATION: 8 weeks  PLANNED INTERVENTIONS: Therapeutic exercises, Therapeutic activity, Neuromuscular re-education, Balance training, Gait training, Patient/Family education, Joint mobilization, Stair training, Aquatic Therapy, Dry Needling, Spinal mobilization, Cryotherapy, Moist heat, scar mobilization, and Ionotophoresis 4mg /ml Dexamethasone  PLAN FOR NEXT SESSION: outcome of DN on AM spasm  Gioia Ranes C. Saamiya Jeppsen PT, DPT 07/20/22 3:37 PM

## 2022-07-27 ENCOUNTER — Ambulatory Visit (HOSPITAL_BASED_OUTPATIENT_CLINIC_OR_DEPARTMENT_OTHER): Payer: Medicare Other | Admitting: Physical Therapy

## 2022-07-27 ENCOUNTER — Encounter (HOSPITAL_BASED_OUTPATIENT_CLINIC_OR_DEPARTMENT_OTHER): Payer: Self-pay | Admitting: Physical Therapy

## 2022-07-27 DIAGNOSIS — R2689 Other abnormalities of gait and mobility: Secondary | ICD-10-CM

## 2022-07-27 DIAGNOSIS — M25552 Pain in left hip: Secondary | ICD-10-CM | POA: Diagnosis not present

## 2022-07-27 DIAGNOSIS — Z96642 Presence of left artificial hip joint: Secondary | ICD-10-CM

## 2022-07-27 DIAGNOSIS — M25652 Stiffness of left hip, not elsewhere classified: Secondary | ICD-10-CM

## 2022-07-27 NOTE — Therapy (Signed)
OUTPATIENT PHYSICAL THERAPY LOWER EXTREMITY TREATMENT     Patient Name: Jimmy Palmer MRN: [id number] DOB:Feb 16, 1941, 81 y.o., male Today's Date: 07/27/2022   PT End of Session - 07/27/22 1352     Visit Number 14    Number of Visits 22    PT Start Time 1345    PT Stop Time 1427    PT Time Calculation (min) 42 min    Activity Tolerance Patient tolerated treatment well;No increased pain    Behavior During Therapy [facility] for tasks assessed/performed                   Past Medical History:  Diagnosis Date   Abnormal electrocardiogram    Abnormal heart sounds    Allergy 2020   upon arrival to [facility]   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   Chronic kidney disease 11/07/2021   Depression 2020   receiving appropriate therapy   Herniated lumbar intervertebral disc    Hypertension    Keratosis    benign   Left bundle branch block    Pacemaker    Pure hypercholesterolemia    Sick sinus syndrome (HCC)    Sleep apnea    VT (ventricular tachycardia) (HCC)    Past Surgical History:  Procedure Laterality Date   CARDIOVERSION N/A 09/28/2020   Procedure: CARDIOVERSION;  Surgeon: Nahser, Deloris Ping, MD;  Location: [facility];  Service: Cardiovascular;  Laterality: N/A;   INSERT / REPLACE / REMOVE PACEMAKER     JOINT REPLACEMENT     partial right knee, medial side   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   Procedure: LEFT TOTAL HIP ARTHROPLASTY ANTERIOR APPROACH;  Surgeon: Kathryne Hitch, MD;  Location: [facility];  Service: Orthopedics;  Laterality: Left;   VASECTOMY     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   Rectal bleeding 02/01/2022   Left hip pain 02/01/2022   Environmental and seasonal allergies 02/01/2022   Hypertension 11/07/2021   Chronic kidney disease 11/07/2021   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   Atrial tachycardia (HCC) 08/26/2020    Pacemaker - MDT 08/26/2020   Persistent atrial fibrillation (HCC) 12/22/2019    PCP: de [place], [name], MD  REFERRING PROVIDER: DR Doneen Poisson   REFERRING DIAG: [phone] (ICD-10-CM) - Status post total replacement of left hip  THERAPY DIAG:  Pain in left hip  Status post total replacement of left hip  Other abnormalities of gait and mobility  Stiffness of left hip, not elsewhere classified  Rationale for Evaluation and Treatment Rehabilitation  ONSET DATE: May 05, 2022  SUBJECTIVE:   SUBJECTIVE STATEMENT: I sensed that I was a little less wobbly this morning. Sore with new exercises.   PERTINENT HISTORY: CKD, HTN, Pacemaker, A-fib  PAIN:  Are you having pain? Yes: NPRS scale: 3/10 Pain location: L hip Pain description: Persistent ache anterolateral hip, posterior spasms Aggravating factors: Prolonged positioning, bending, lifting, stairs Relieving factors: Rest, Tylenol, heat  PRECAUTIONS: Anterior hip  WEIGHT BEARING RESTRICTIONS No  FALLS:  Has patient fallen in last 6 months? No  LIVING ENVIRONMENT: Lives with: lives with their spouse Lives in: House/apartment Stairs: Yes: External: 3 steps; on right going up Has following equipment at home: Single point cane - since surgery  OCCUPATION: Retired  PLOF: Independent  PATIENT GOALS  Wants to get out for  yard work and community activity.   OBJECTIVE:   PATIENT SURVEYS:  FOTO 43% ability  68% expected in 13 visits   8/24: 57   POSTURE: No Significant postural limitations   PALPATION: Mild scar tissue around the scar.   LOWER EXTREMITY ROM:  Passive ROM Right eval Left 8/15  Hip flexion  120 no pain   Hip extension    Hip abduction    Hip adduction    Hip internal rotation  Not pushed to end range but pain free   Hip external rotation  Not pushed to end range but pain free  Knee flexion    Knee extension    Ankle dorsiflexion    Ankle plantarflexion    Ankle inversion     Ankle eversion     (Blank rows = not tested)  LOWER EXTREMITY MMT:  MMT Right eval Left eval  Hip flexion 36 25  Hip extension    Hip abduction 60.5 38.5 not tested this visit   Hip adduction 40.8 49.8  Hip internal rotation    Hip external rotation    Knee flexion    Knee extension    Ankle dorsiflexion    Ankle plantarflexion    Ankle inversion    Ankle eversion     (Blank rows = not tested)  Gait 8/15: improved lateral movement with gait    TODAY'S TREATMENT: 8/31 Reviewed use of the heel lift. He was shown where to purchase one, but he was also advised that this is common with a hip replacement and it will likely resolve and he will not require it after some time  Nu-step 5 min L3   Hip abduction 2x15 green  Birdge x15  Supine march 2x15    Seated LAQ 2x15 green   Step up 4 inch 2x10  Lateral step up 2 inch 2x10   Mini squat 2x10    8/24: Trigger Point Dry Needling, Manual Therapy Treatment:  Initial or subsequent education regarding Trigger Point Dry Needling: Initial Did patient give consent to treatment with Trigger Point Dry Needling: Yes TPDN with skilled palpation and monitoring followed by STM to the following muscles: Rt glut med/min, piriformis  Bike 10 min  Seated figure 4 stretch Seated HS stretch   8/22: Manual trigge rpoint release to bilateral glut med & piriformis Bike 4 min L5 Shuttle- 3*25+12 double leg press; 2*25+6 single leg press Wall squat, also added adduction knee taps Tandem balance Seated figure 4    PATIENT EDUCATION:  Education details: exercise form/rationale Person educated: Patient Education method: Explanation Education comprehension: verbalized understanding   HOME EXERCISE PROGRAM: Access Code: [email or web address] URL: [email or web address]   ASSESSMENT:  CLINICAL IMPRESSION: The patient reported the dry needling did help the spasming. He has had less issue in the morning, We focused more on  functional strengthening this visit. If the spasming increases again after this visit we will re-visit needling next visit. We added in stair training today> he tolerated well. We will continue to progress functional strengthening.  OBJECTIVE IMPAIRMENTS decreased activity tolerance, decreased balance, decreased endurance, difficulty walking, decreased ROM, decreased strength, and increased muscle spasms.   ACTIVITY LIMITATIONS lifting, bending, squatting, and stairs  PARTICIPATION LIMITATIONS: occupation and yard work  PERSONAL FACTORS Age, Past/current experiences, and 3+ comorbidities: pacemaker, uka right side, recent sciatica   are also affecting patient's functional outcome.   REHAB POTENTIAL: Good  CLINICAL DECISION MAKING: Evolving/moderate complexity  EVALUATION COMPLEXITY: Moderate   GOALS: Goals reviewed with patient? Yes  SHORT TERM GOALS: Target date: 06/22/2022  Patient will increase gross strength of left hip by 5 lbs  Baseline: Goal status: not assessed  2.  Patient will demonstrate full pain free AROM of the left hip  Baseline:  Goal status: achieved  3.  Patient will be independent with base exercise program  Baseline:  Goal status: achieved  4.  Patient will ambulate 300' without an AD  Baseline: I do use a cane to get out of bed first thing in the AM- after I sit with heat I am good to go Goal status: partially met  LONG TERM GOALS: Target date: 07/20/2022   Patient will go up and down steps without increased pain  Baseline: it doesn't feel easy but it's better Goal status: achieved  2.  Patient will perform safe yard work tasks without pain  Baseline: have a guy that comes now, will try again when it cools down Goal status: deferred for now  3.  Patient will walk community distances without increased pain  Baseline: as long as I use heat in the morning, I am good to go.  Goal status: partially met  4. No longer experiencing AM muscle  spasm  Baseline: need heat every AM  Goal status: NEW   PLAN: PT FREQUENCY: 2x/week  PT DURATION: 8 weeks  PLANNED INTERVENTIONS: Therapeutic exercises, Therapeutic activity, Neuromuscular re-education, Balance training, Gait training, Patient/Family education, Joint mobilization, Stair training, Aquatic Therapy, Dry Needling, Spinal mobilization, Cryotherapy, Moist heat, scar mobilization, and Ionotophoresis 4mg /ml Dexamethasone  PLAN FOR NEXT SESSION: outcome of DN on AM spasm  Carolyne Littles PT DPT  07/27/22 1:56 PM

## 2022-08-02 ENCOUNTER — Encounter (HOSPITAL_BASED_OUTPATIENT_CLINIC_OR_DEPARTMENT_OTHER): Payer: Self-pay | Admitting: Physical Therapy

## 2022-08-02 ENCOUNTER — Ambulatory Visit (HOSPITAL_BASED_OUTPATIENT_CLINIC_OR_DEPARTMENT_OTHER): Payer: Medicare Other | Attending: Orthopaedic Surgery | Admitting: Physical Therapy

## 2022-08-02 DIAGNOSIS — M25652 Stiffness of left hip, not elsewhere classified: Secondary | ICD-10-CM | POA: Insufficient documentation

## 2022-08-02 DIAGNOSIS — Z96642 Presence of left artificial hip joint: Secondary | ICD-10-CM | POA: Diagnosis present

## 2022-08-02 DIAGNOSIS — M25552 Pain in left hip: Secondary | ICD-10-CM | POA: Diagnosis not present

## 2022-08-02 DIAGNOSIS — R2689 Other abnormalities of gait and mobility: Secondary | ICD-10-CM | POA: Diagnosis present

## 2022-08-03 ENCOUNTER — Encounter (HOSPITAL_BASED_OUTPATIENT_CLINIC_OR_DEPARTMENT_OTHER): Payer: Self-pay | Admitting: Physical Therapy

## 2022-08-03 NOTE — Therapy (Signed)
OUTPATIENT PHYSICAL THERAPY LOWER EXTREMITY TREATMENT     Patient Name: Jimmy Palmer MRN: [id number] DOB:Dec 23, 1940, 81 y.o., male Today's Date: 08/03/2022   PT End of Session - 08/02/22 1551     Visit Number 15    Number of Visits 22    Date for PT Re-Evaluation 08/22/22    Authorization Type progress note done at 38    PT Start Time 1515    PT Stop Time 1554    PT Time Calculation (min) 39 min    Activity Tolerance Patient tolerated treatment well;No increased pain    Behavior During Therapy [facility] for tasks assessed/performed                   Past Medical History:  Diagnosis Date   Abnormal electrocardiogram    Abnormal heart sounds    Allergy 2020   upon arrival to [facility]   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   Chronic kidney disease 11/07/2021   Depression 2020   receiving appropriate therapy   Herniated lumbar intervertebral disc    Hypertension    Keratosis    benign   Left bundle branch block    Pacemaker    Pure hypercholesterolemia    Sick sinus syndrome (HCC)    Sleep apnea    VT (ventricular tachycardia) ([place])    Past Surgical History:  Procedure Laterality Date   CARDIOVERSION N/A 09/28/2020   Procedure: CARDIOVERSION;  Surgeon: Nahser, Wonda Cheng, MD;  Location: [place];  Service: Cardiovascular;  Laterality: N/A;   INSERT / REPLACE / REMOVE PACEMAKER     JOINT REPLACEMENT     partial right knee, medial side   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   Procedure: LEFT TOTAL HIP ARTHROPLASTY ANTERIOR APPROACH;  Surgeon: Mcarthur Rossetti, MD;  Location: [facility];  Service: Orthopedics;  Laterality: Left;   VASECTOMY     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   Rectal bleeding 02/01/2022   Left hip pain 02/01/2022   Environmental and seasonal allergies 02/01/2022   Hypertension 11/07/2021   Chronic kidney disease 11/07/2021    Heart block AV complete ([place]) 08/26/2020   Atrial tachycardia ([place]) 08/26/2020   Pacemaker - MDT 08/26/2020   Persistent atrial fibrillation ([place]) 12/22/2019    PCP: [name], [name], MD  REFERRING PROVIDER: DR Jean Rosenthal   REFERRING DIAG: [phone] (ICD-10-CM) - Status post total replacement of left hip  THERAPY DIAG:  Pain in left hip  Status post total replacement of left hip  Other abnormalities of gait and mobility  Stiffness of left hip, not elsewhere classified  Rationale for Evaluation and Treatment Rehabilitation  ONSET DATE: May 05, 2022  SUBJECTIVE:   SUBJECTIVE STATEMENT: The patient reports the spasms have reduced significantly. He does have a head cold. He has tested negative for covid   PERTINENT HISTORY: CKD, HTN, Pacemaker, A-fib  PAIN:  Are you having pain? Yes: NPRS scale: 3/10 Pain location: L hip Pain description: Persistent ache anterolateral hip, posterior spasms Aggravating factors: Prolonged positioning, bending, lifting, stairs Relieving factors: Rest, Tylenol, heat  PRECAUTIONS: Anterior hip  WEIGHT BEARING RESTRICTIONS No  FALLS:  Has patient fallen in last 6 months? No  LIVING ENVIRONMENT: Lives with: lives with their spouse Lives in: House/apartment Stairs: Yes: External: 3 steps; on right going up Has following equipment  at home: Single point cane - since surgery  OCCUPATION: Retired  PLOF: Independent  PATIENT GOALS  Wants to get out for yard work and community activity.   OBJECTIVE:   PATIENT SURVEYS:  FOTO 43% ability  68% expected in 13 visits   8/24: 57   POSTURE: No Significant postural limitations   PALPATION: Mild scar tissue around the scar.   LOWER EXTREMITY ROM:  Passive ROM Right eval Left 8/15  Hip flexion  120 no pain   Hip extension    Hip abduction    Hip adduction    Hip internal rotation  Not pushed to end range but pain free   Hip external rotation  Not pushed to end range  but pain free  Knee flexion    Knee extension    Ankle dorsiflexion    Ankle plantarflexion    Ankle inversion    Ankle eversion     (Blank rows = not tested)  LOWER EXTREMITY MMT:  MMT Right eval Left eval  Hip flexion 36 25  Hip extension    Hip abduction 60.5 38.5 not tested this visit   Hip adduction 40.8 49.8  Hip internal rotation    Hip external rotation    Knee flexion    Knee extension    Ankle dorsiflexion    Ankle plantarflexion    Ankle inversion    Ankle eversion     (Blank rows = not tested)  Gait 8/15: improved lateral movement with gait    TODAY'S TREATMENT: 9/6 Hip abduction 2x15 green  Birdge x15  Supine march 2x15    Seated LAQ 2x15 green   Step up 4 inch 2x10  Lateral step up 2 inch 2x10   Heel raise x20 March x20   Nu-step L4 6 min   8/31 Reviewed use of the heel lift. He was shown where to purchase one, but he was also advised that this is common with a hip replacement and it will likely resolve and he will not require it after some time  Nu-step 5 min L3   Hip abduction 2x15 green  Birdge x15  Supine march 2x15    Seated LAQ 2x15 green   Step up 4 inch 2x10  Lateral step up 2 inch 2x10   Mini squat 2x10     PATIENT EDUCATION:  Education details: exercise form/rationale Person educated: Patient Education method: Explanation Education comprehension: verbalized understanding   HOME EXERCISE PROGRAM: Access Code: [email or web address] URL: [email or web address]   ASSESSMENT:  CLINICAL IMPRESSION: Patient was slightly limited today by his head cold. He still tolerated his exercises well but he was a little more fatigued. We continue to work on advancing his steps. He had no increase in pain with treatment. We will use needling as needed. He was advised to let therapy know if he has the spasms in the morning start again.   OBJECTIVE IMPAIRMENTS decreased activity tolerance, decreased balance, decreased endurance,  difficulty walking, decreased ROM, decreased strength, and increased muscle spasms.   ACTIVITY LIMITATIONS lifting, bending, squatting, and stairs  PARTICIPATION LIMITATIONS: occupation and yard work  PERSONAL FACTORS Age, Past/current experiences, and 3+ comorbidities: pacemaker, uka right side, recent sciatica   are also affecting patient's functional outcome.   REHAB POTENTIAL: Good  CLINICAL DECISION MAKING: Evolving/moderate complexity  EVALUATION COMPLEXITY: Moderate   GOALS: Goals reviewed with patient? Yes  SHORT TERM GOALS: Target date: 06/22/2022  Patient will increase gross strength of left hip by 5 lbs  Baseline: Goal status:  not assessed  2.  Patient will demonstrate full pain free AROM of the left hip  Baseline:  Goal status: achieved  3.  Patient will be independent with base exercise program  Baseline:  Goal status: achieved  4.  Patient will ambulate 300' without an AD  Baseline: I do use a cane to get out of bed first thing in the AM- after I sit with heat I am good to go Goal status: partially met  LONG TERM GOALS: Target date: 07/20/2022   Patient will go up and down steps without increased pain  Baseline: it doesn't feel easy but it's better Goal status: achieved  2.  Patient will perform safe yard work tasks without pain  Baseline: have a guy that comes now, will try again when it cools down Goal status: deferred for now  3.  Patient will walk community distances without increased pain  Baseline: as long as I use heat in the morning, I am good to go.  Goal status: partially met  4. No longer experiencing AM muscle spasm  Baseline: need heat every AM  Goal status: NEW   PLAN: PT FREQUENCY: 2x/week  PT DURATION: 8 weeks  PLANNED INTERVENTIONS: Therapeutic exercises, Therapeutic activity, Neuromuscular re-education, Balance training, Gait training, Patient/Family education, Joint mobilization, Stair training, Aquatic Therapy, Dry Needling,  Spinal mobilization, Cryotherapy, Moist heat, scar mobilization, and Ionotophoresis 4mg /ml Dexamethasone  PLAN FOR NEXT SESSION: outcome of DN on AM spasm  Carolyne Littles PT DPT  08/03/22 11:53 AM

## 2022-08-07 ENCOUNTER — Ambulatory Visit (HOSPITAL_BASED_OUTPATIENT_CLINIC_OR_DEPARTMENT_OTHER): Payer: Medicare Other | Admitting: Physical Therapy

## 2022-08-07 ENCOUNTER — Encounter (HOSPITAL_BASED_OUTPATIENT_CLINIC_OR_DEPARTMENT_OTHER): Payer: Medicare Other | Admitting: Physical Therapy

## 2022-08-07 DIAGNOSIS — M25552 Pain in left hip: Secondary | ICD-10-CM

## 2022-08-07 DIAGNOSIS — Z96642 Presence of left artificial hip joint: Secondary | ICD-10-CM

## 2022-08-07 DIAGNOSIS — M25652 Stiffness of left hip, not elsewhere classified: Secondary | ICD-10-CM

## 2022-08-07 DIAGNOSIS — R2689 Other abnormalities of gait and mobility: Secondary | ICD-10-CM

## 2022-08-07 NOTE — Therapy (Signed)
OUTPATIENT PHYSICAL THERAPY LOWER EXTREMITY TREATMENT     Patient Name: Jimmy Palmer MRN: [id number] DOB:[birth year], 81 y.o., male Today's Date: 08/08/2022   PT End of Session - 08/08/22 1134     Visit Number 16    Number of Visits 22    Date for PT Re-Evaluation 08/22/22    Authorization Type progress note done at 20    PT Start Time 1230    PT Stop Time 1312    PT Time Calculation (min) 42 min    Activity Tolerance Patient tolerated treatment well;No increased pain    Behavior During Therapy [facility] for tasks assessed/performed                    Past Medical History:  Diagnosis Date   Abnormal electrocardiogram    Abnormal heart sounds    Allergy 2020   upon arrival to [facility]   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   Chronic kidney disease 11/07/2021   Depression 2020   receiving appropriate therapy   Herniated lumbar intervertebral disc    Hypertension    Keratosis    benign   Left bundle branch block    Pacemaker    Pure hypercholesterolemia    Sick sinus syndrome (HCC)    Sleep apnea    VT (ventricular tachycardia) ([place])    Past Surgical History:  Procedure Laterality Date   CARDIOVERSION N/A 09/28/2020   Procedure: CARDIOVERSION;  Surgeon: Nahser, Wonda Cheng, MD;  Location: [place];  Service: Cardiovascular;  Laterality: N/A;   INSERT / REPLACE / REMOVE PACEMAKER     JOINT REPLACEMENT     partial right knee, medial side   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   Procedure: LEFT TOTAL HIP ARTHROPLASTY ANTERIOR APPROACH;  Surgeon: Mcarthur Rossetti, MD;  Location: [facility];  Service: Orthopedics;  Laterality: Left;   VASECTOMY     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   Rectal bleeding 02/01/2022   Left hip pain 02/01/2022   Environmental and seasonal allergies 02/01/2022   Hypertension 11/07/2021   Chronic kidney disease  11/07/2021   Heart block AV complete ([place]) 08/26/2020   Atrial tachycardia ([place]) 08/26/2020   Pacemaker - MDT 08/26/2020   Persistent atrial fibrillation ([place]) 12/22/2019    PCP: [name], [name], MD  REFERRING PROVIDER: DR Jean Rosenthal   REFERRING DIAG: [phone] (ICD-10-CM) - Status post total replacement of left hip  THERAPY DIAG:  Pain in left hip  Status post total replacement of left hip  Other abnormalities of gait and mobility  Stiffness of left hip, not elsewhere classified  Rationale for Evaluation and Treatment Rehabilitation  ONSET DATE: May 05, 2022  SUBJECTIVE:   SUBJECTIVE STATEMENT: The patient reports the spasms have reduced significantly. He does have a head cold. He has tested negative for covid   PERTINENT HISTORY: CKD, HTN, Pacemaker, A-fib  PAIN:  Are you having pain? Yes: NPRS scale: 3/10 Pain location: L hip Pain description: Persistent ache anterolateral hip, posterior spasms Aggravating factors: Prolonged positioning, bending, lifting, stairs Relieving factors: Rest, Tylenol, heat  PRECAUTIONS: Anterior hip  WEIGHT BEARING RESTRICTIONS No  FALLS:  Has patient fallen in last 6 months? No  LIVING ENVIRONMENT: Lives with: lives with their spouse Lives in: House/apartment Stairs: Yes: External: 3 steps; on right going up Has following  equipment at home: Single point cane - since surgery  OCCUPATION: Retired  PLOF: Independent  PATIENT GOALS  Wants to get out for yard work and community activity.   OBJECTIVE:   PATIENT SURVEYS:  FOTO 43% ability  68% expected in 13 visits   8/24: 57   POSTURE: No Significant postural limitations   PALPATION: Mild scar tissue around the scar.   LOWER EXTREMITY ROM:  Passive ROM Right eval Left 8/15  Hip flexion  120 no pain   Hip extension    Hip abduction    Hip adduction    Hip internal rotation  Not pushed to end range but pain free   Hip external rotation  Not pushed to  end range but pain free  Knee flexion    Knee extension    Ankle dorsiflexion    Ankle plantarflexion    Ankle inversion    Ankle eversion     (Blank rows = not tested)  LOWER EXTREMITY MMT:  MMT Right eval Left eval  Hip flexion 36 25  Hip extension    Hip abduction 60.5 38.5 not tested this visit   Hip adduction 40.8 49.8  Hip internal rotation    Hip external rotation    Knee flexion    Knee extension    Ankle dorsiflexion    Ankle plantarflexion    Ankle inversion    Ankle eversion     (Blank rows = not tested)  Gait 8/15: improved lateral movement with gait    TODAY'S TREATMENT:  9/11 Nu-step L4 6 min  Nu step Level 4 10-15 min  #3 Knee extension Lifestyles 3x10 10lbs  Leg press: #1 Lifestyles 30 3x15  Cybex 45 3x15  #6 Hip abduction machine 3x15 25 Lbs   Reviewed the use of the nu-step and bike in the gym         9/6 Hip abduction 2x15 green  Birdge x15  Supine march 2x15    Seated LAQ 2x15 green   Step up 4 inch 2x10  Lateral step up 2 inch 2x10   Heel raise x20 March x20   Nu-step L4 6 min   8/31 Reviewed use of the heel lift. He was shown where to purchase one, but he was also advised that this is common with a hip replacement and it will likely resolve and he will not require it after some time  Nu-step 5 min L3   Hip abduction 2x15 green  Birdge x15  Supine march 2x15    Seated LAQ 2x15 green   Step up 4 inch 2x10  Lateral step up 2 inch 2x10   Mini squat 2x10     PATIENT EDUCATION:  Education details: exercise form/rationale Person educated: Patient Education method: Explanation Education comprehension: verbalized understanding   HOME EXERCISE PROGRAM: Access Code: [email or web address] URL: [email or web address]   ASSESSMENT:  CLINICAL IMPRESSION: The patient tolerated treatment very well today. He started with his gym exercises today. He is motivated to get back to the gym. He did very well. We talked to  him about expected response to exercises. We will continue to advance patient as tolerated. We will continue to take him through the gym exercises and make him comfortable. He was advised to tell therapy if the spasms come back.  OBJECTIVE IMPAIRMENTS decreased activity tolerance, decreased balance, decreased endurance, difficulty walking, decreased ROM, decreased strength, and increased muscle spasms.   ACTIVITY LIMITATIONS lifting, bending, squatting, and stairs  PARTICIPATION LIMITATIONS: occupation and yard work  PERSONAL FACTORS Age, Past/current experiences, and 3+ comorbidities: pacemaker, uka right side, recent sciatica   are also affecting patient's functional outcome.   REHAB POTENTIAL: Good  CLINICAL DECISION MAKING: Evolving/moderate complexity  EVALUATION COMPLEXITY: Moderate   GOALS: Goals reviewed with patient? Yes  SHORT TERM GOALS: Target date: 06/22/2022  Patient will increase gross strength of left hip by 5 lbs  Baseline: Goal status: not assessed  2.  Patient will demonstrate full pain free AROM of the left hip  Baseline:  Goal status: achieved  3.  Patient will be independent with base exercise program  Baseline:  Goal status: achieved  4.  Patient will ambulate 300' without an AD  Baseline: I do use a cane to get out of bed first thing in the AM- after I sit with heat I am good to go Goal status: partially met  LONG TERM GOALS: Target date: 07/20/2022   Patient will go up and down steps without increased pain  Baseline: it doesn't feel easy but it's better Goal status: achieved  2.  Patient will perform safe yard work tasks without pain  Baseline: have a guy that comes now, will try again when it cools down Goal status: deferred for now  3.  Patient will walk community distances without increased pain  Baseline: as long as I use heat in the morning, I am good to go.  Goal status: partially met  4. No longer experiencing AM muscle spasm  Baseline:  need heat every AM  Goal status: NEW   PLAN: PT FREQUENCY: 2x/week  PT DURATION: 8 weeks  PLANNED INTERVENTIONS: Therapeutic exercises, Therapeutic activity, Neuromuscular re-education, Balance training, Gait training, Patient/Family education, Joint mobilization, Stair training, Aquatic Therapy, Dry Needling, Spinal mobilization, Cryotherapy, Moist heat, scar mobilization, and Ionotophoresis 5m/ml Dexamethasone  PLAN FOR NEXT SESSION: outcome of DN on AM spasm  DCarolyne LittlesPT DPT  08/08/22 12:09 PM

## 2022-08-11 ENCOUNTER — Ambulatory Visit (HOSPITAL_BASED_OUTPATIENT_CLINIC_OR_DEPARTMENT_OTHER): Payer: Medicare Other | Admitting: Physical Therapy

## 2022-08-11 ENCOUNTER — Encounter (HOSPITAL_BASED_OUTPATIENT_CLINIC_OR_DEPARTMENT_OTHER): Payer: Self-pay | Admitting: Physical Therapy

## 2022-08-11 DIAGNOSIS — M25552 Pain in left hip: Secondary | ICD-10-CM

## 2022-08-11 DIAGNOSIS — Z96642 Presence of left artificial hip joint: Secondary | ICD-10-CM

## 2022-08-11 DIAGNOSIS — R2689 Other abnormalities of gait and mobility: Secondary | ICD-10-CM

## 2022-08-11 DIAGNOSIS — M25652 Stiffness of left hip, not elsewhere classified: Secondary | ICD-10-CM

## 2022-08-11 NOTE — Therapy (Signed)
OUTPATIENT PHYSICAL THERAPY LOWER EXTREMITY TREATMENT     Patient Name: Jimmy Palmer MRN: [id number] DOB:05-29-1941, 81 y.o., male Today's Date: 08/11/2022   PT End of Session - 08/11/22 1554     Visit Number 17    Number of Visits 22    Date for PT Re-Evaluation 08/22/22    Authorization Type progress note done at 20    PT Start Time 1430    PT Stop Time 1513    PT Time Calculation (min) 43 min    Activity Tolerance Patient tolerated treatment well;No increased pain    Behavior During Therapy [facility] for tasks assessed/performed                    Past Medical History:  Diagnosis Date   Abnormal electrocardiogram    Abnormal heart sounds    Allergy 2020   upon arrival to [facility]   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   Chronic kidney disease 11/07/2021   Depression 2020   receiving appropriate therapy   Herniated lumbar intervertebral disc    Hypertension    Keratosis    benign   Left bundle branch block    Pacemaker    Pure hypercholesterolemia    Sick sinus syndrome (HCC)    Sleep apnea    VT (ventricular tachycardia) ([place])    Past Surgical History:  Procedure Laterality Date   CARDIOVERSION N/A 09/28/2020   Procedure: CARDIOVERSION;  Surgeon: Nahser, Wonda Cheng, MD;  Location: [place];  Service: Cardiovascular;  Laterality: N/A;   INSERT / REPLACE / REMOVE PACEMAKER     JOINT REPLACEMENT     partial right knee, medial side   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   Procedure: LEFT TOTAL HIP ARTHROPLASTY ANTERIOR APPROACH;  Surgeon: Mcarthur Rossetti, MD;  Location[facility];  Service: Orthopedics;  Laterality: Left;   VASECTOMY     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   Rectal bleeding 02/01/2022   Left hip pain 02/01/2022   Environmental and seasonal allergies 02/01/2022   Hypertension 11/07/2021   Chronic kidney disease  11/07/2021   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   Atrial tachycardia ([place]) 08/26/2020   Pacemaker - MDT 08/26/2020   Persistent atrial fibrillation ([place]) 12/22/2019    PCP: [name], [name], MD  REFERRING PROVIDER: DR Jean Rosenthal   REFERRING DIAG: [phone] (ICD-10-CM) - Status post total replacement of left hip  THERAPY DIAG:  Pain in left hip  Status post total replacement of left hip  Other abnormalities of gait and mobility  Stiffness of left hip, not elsewhere classified  Rationale for Evaluation and Treatment Rehabilitation  ONSET DATE: May 05, 2022  SUBJECTIVE:   SUBJECTIVE STATEMENT: The patient reports the spasms returned the past 2 mornings when he first gets up. No pain right now   PERTINENT HISTORY: CKD, HTN, Pacemaker, A-fib  PAIN:  Are you having pain? Yes: NPRS scale: 3/10 Pain location: L hip Pain description: Persistent ache anterolateral hip, posterior spasms Aggravating factors: Prolonged positioning, bending, lifting, stairs Relieving factors: Rest, Tylenol, heat  PRECAUTIONS: Anterior hip  WEIGHT BEARING RESTRICTIONS No  FALLS:  Has patient fallen in last 6 months? No  LIVING ENVIRONMENT: Lives with: lives with their spouse Lives in: House/apartment Stairs: Yes: External: 3 steps; on right going up Has following equipment  at home: Single point cane - since surgery  OCCUPATION: Retired  PLOF: Independent  PATIENT GOALS  Wants to get out for yard work and community activity.   OBJECTIVE:   PATIENT SURVEYS:  FOTO 43% ability  68% expected in 13 visits   8/24: 57   POSTURE: No Significant postural limitations   PALPATION: Mild scar tissue around the scar.   LOWER EXTREMITY ROM:  Passive ROM Right eval Left 8/15  Hip flexion  120 no pain   Hip extension    Hip abduction    Hip adduction    Hip internal rotation  Not pushed to end range but pain free   Hip external rotation  Not pushed to end range but  pain free  Knee flexion    Knee extension    Ankle dorsiflexion    Ankle plantarflexion    Ankle inversion    Ankle eversion     (Blank rows = not tested)  LOWER EXTREMITY MMT:  MMT Right eval Left eval  Hip flexion 36 25  Hip extension    Hip abduction 60.5 38.5 not tested this visit   Hip adduction 40.8 49.8  Hip internal rotation    Hip external rotation    Knee flexion    Knee extension    Ankle dorsiflexion    Ankle plantarflexion    Ankle inversion    Ankle eversion     (Blank rows = not tested)  Gait 8/15: improved lateral movement with gait    TODAY'S TREATMENT: 9/15 X-ride seate 12 L1 5 min  #1 Lifestyles 30 3x15  Life styles hamstring 10 lbs 3x15 Cable row 3x15  Cable extension 3x15   Trigger Point Dry-Needling  Treatment instructions: Expect mild to moderate muscle soreness. S/S of pneumothorax if dry needled over a lung field, and to seek immediate medical attention should they occur. Patient verbalized understanding of these instructions and education.  Patient Consent Given: Yes Education handout provided: Yes Muscles treated: bilateral glut med 2x each side  Electrical stimulation performed: No Parameters: N/A Treatment response/outcome: great twitch   Manual: trigger point release to gluteals   9/11 Nu-step L4 6 min  Nu step Level 4 10-15 min  #3 Knee extension Lifestyles 3x10 10lbs  Leg press: #1 Lifestyles 30 3x15  Cybex 45 3x15  #6 Hip abduction machine 3x15 25 Lbs   Reviewed the use of the nu-step and bike in the gym         PATIENT EDUCATION:  Education details: exercise form/rationale Person educated: Patient Education method: Explanation Education comprehension: verbalized understanding   HOME EXERCISE PROGRAM: Access Code: [email or web address] URL: [email or web address]   ASSESSMENT:  CLINICAL IMPRESSION: The patient reported the spasming has returned somewhat. We performed TPDN which helped in the past. He  tolerated well and had a good twitch. We continue to expand his gym program. We added UE core stability exercises. He did well with those. We will continue to progress strengthening as tolerated. He was given an updated gym program.     OBJECTIVE IMPAIRMENTS decreased activity tolerance, decreased balance, decreased endurance, difficulty walking, decreased ROM, decreased strength, and increased muscle spasms.   ACTIVITY LIMITATIONS lifting, bending, squatting, and stairs  PARTICIPATION LIMITATIONS: occupation and yard work  PERSONAL FACTORS Age, Past/current experiences, and 3+ comorbidities: pacemaker, uka right side, recent sciatica   are also affecting patient's functional outcome.   REHAB POTENTIAL: Good  CLINICAL DECISION MAKING: Evolving/moderate complexity  EVALUATION COMPLEXITY: Moderate   GOALS: Goals reviewed with  patient? Yes  SHORT TERM GOALS: Target date: 06/22/2022  Patient will increase gross strength of left hip by 5 lbs  Baseline: Goal status: not assessed  2.  Patient will demonstrate full pain free AROM of the left hip  Baseline:  Goal status: achieved  3.  Patient will be independent with base exercise program  Baseline:  Goal status: achieved  4.  Patient will ambulate 300' without an AD  Baseline: I do use a cane to get out of bed first thing in the AM- after I sit with heat I am good to go Goal status: partially met  LONG TERM GOALS: Target date: 07/20/2022   Patient will go up and down steps without increased pain  Baseline: it doesn't feel easy but it's better Goal status: achieved  2.  Patient will perform safe yard work tasks without pain  Baseline: have a guy that comes now, will try again when it cools down Goal status: deferred for now  3.  Patient will walk community distances without increased pain  Baseline: as long as I use heat in the morning, I am good to go.  Goal status: partially met  4. No longer experiencing AM muscle  spasm  Baseline: need heat every AM  Goal status: NEW   PLAN: PT FREQUENCY: 2x/week  PT DURATION: 8 weeks  PLANNED INTERVENTIONS: Therapeutic exercises, Therapeutic activity, Neuromuscular re-education, Balance training, Gait training, Patient/Family education, Joint mobilization, Stair training, Aquatic Therapy, Dry Needling, Spinal mobilization, Cryotherapy, Moist heat, scar mobilization, and Ionotophoresis 45m/ml Dexamethasone  PLAN FOR NEXT SESSION: outcome of DN on AM spasm  DCarolyne LittlesPT DPT  08/11/22 3:55 PM

## 2022-08-15 ENCOUNTER — Encounter (HOSPITAL_BASED_OUTPATIENT_CLINIC_OR_DEPARTMENT_OTHER): Payer: Self-pay | Admitting: Physical Therapy

## 2022-08-15 ENCOUNTER — Ambulatory Visit (HOSPITAL_BASED_OUTPATIENT_CLINIC_OR_DEPARTMENT_OTHER): Payer: Medicare Other | Admitting: Physical Therapy

## 2022-08-15 DIAGNOSIS — M25552 Pain in left hip: Secondary | ICD-10-CM

## 2022-08-15 DIAGNOSIS — Z96642 Presence of left artificial hip joint: Secondary | ICD-10-CM

## 2022-08-15 DIAGNOSIS — R2689 Other abnormalities of gait and mobility: Secondary | ICD-10-CM

## 2022-08-15 NOTE — Therapy (Signed)
OUTPATIENT PHYSICAL THERAPY LOWER EXTREMITY TREATMENT     Patient Name: Jimmy Palmer MRN: [id number] DOB:Apr 20, 1941, 81 y.o., male Today's Date: 08/15/2022   PT End of Session - 08/15/22 1311     Visit Number 18    Number of Visits 22    Date for PT Re-Evaluation 08/22/22    Authorization Type progress note done at 54    PT Start Time 1310    PT Stop Time 3149    PT Time Calculation (min) 38 min    Activity Tolerance Patient tolerated treatment well    Behavior During Therapy [facility] for tasks assessed/performed                    Past Medical History:  Diagnosis Date   Abnormal electrocardiogram    Abnormal heart sounds    Allergy 2020   upon arrival to [facility]   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   Chronic kidney disease 11/07/2021   Depression 2020   receiving appropriate therapy   Herniated lumbar intervertebral disc    Hypertension    Keratosis    benign   Left bundle branch block    Pacemaker    Pure hypercholesterolemia    Sick sinus syndrome (HCC)    Sleep apnea    VT (ventricular tachycardia) ([place])    Past Surgical History:  Procedure Laterality Date   CARDIOVERSION N/A 09/28/2020   Procedure: CARDIOVERSION;  Surgeon: Acie Fredrickson Wonda Cheng, MD;  Location: [place];  Service: Cardiovascular;  Laterality: N/A;   INSERT / REPLACE / REMOVE PACEMAKER     JOINT REPLACEMENT     partial right knee, medial side   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   Procedure: LEFT TOTAL HIP ARTHROPLASTY ANTERIOR APPROACH;  Surgeon: Mcarthur Rossetti, MD;  Location: [facility];  Service: Orthopedics;  Laterality: Left;   VASECTOMY     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   Rectal bleeding 02/01/2022   Left hip pain 02/01/2022   Environmental and seasonal allergies 02/01/2022   Hypertension 11/07/2021   Chronic kidney disease 11/07/2021   Heart block  AV complete ([place]) 08/26/2020   Atrial tachycardia ([place]) 08/26/2020   Pacemaker - MDT 08/26/2020   Persistent atrial fibrillation ([place]) 12/22/2019    PCP: [name], [name], MD  REFERRING PROVIDER: DR Jean Rosenthal   REFERRING DIAG: [phone] (ICD-10-CM) - Status post total replacement of left hip  THERAPY DIAG:  Pain in left hip  Status post total replacement of left hip  Other abnormalities of gait and mobility  Rationale for Evaluation and Treatment Rehabilitation  ONSET DATE: May 05, 2022  SUBJECTIVE:   SUBJECTIVE STATEMENT: Some needling last time, I think the pain migrated to the Rt.   PERTINENT HISTORY: CKD, HTN, Pacemaker, A-fib  PAIN:  Are you having pain? Yes: NPRS scale: 3/10 Pain location: L hip Pain description: Persistent ache anterolateral hip, posterior spasms Aggravating factors: Prolonged positioning, bending, lifting, stairs Relieving factors: Rest, Tylenol, heat  PRECAUTIONS: Anterior hip  WEIGHT BEARING RESTRICTIONS No  FALLS:  Has patient fallen in last 6 months? No  LIVING ENVIRONMENT: Lives with: lives with their spouse Lives in: House/apartment Stairs: Yes: External: 3 steps; on right going up Has following equipment at home: Single point cane - since surgery  OCCUPATION: Retired  PLOF: [place]  Wants to get out for yard work and community activity.   OBJECTIVE:   PATIENT SURVEYS:  FOTO 43% ability  68% expected in 13 visits   8/24: 57   POSTURE: No Significant postural limitations   PALPATION: Mild scar tissue around the scar.   LOWER EXTREMITY ROM:  Passive ROM Right eval Left 8/15  Hip flexion  120 no pain   Hip extension    Hip abduction    Hip adduction    Hip internal rotation  Not pushed to end range but pain free   Hip external rotation  Not pushed to end range but pain free  Knee flexion    Knee extension    Ankle dorsiflexion    Ankle plantarflexion    Ankle inversion     Ankle eversion     (Blank rows = not tested)  LOWER EXTREMITY MMT:  MMT Right eval Left eval  Hip flexion 36 25  Hip extension    Hip abduction 60.5 38.5 not tested this visit   Hip adduction 40.8 49.8  Hip internal rotation    Hip external rotation    Knee flexion    Knee extension    Ankle dorsiflexion    Ankle plantarflexion    Ankle inversion    Ankle eversion     (Blank rows = not tested)  Gait 8/15: improved lateral movement with gait    TODAY'S TREATMENT: 9/19 MANUAL: STM Rt glut min & TFL Nus tep 4 min L6 Wall sits Cable machine- post stepping, squatting, side stepping   9/15 X-ride seate 12 L1 5 min  #1 Lifestyles 30 3x15  Life styles hamstring 10 lbs 3x15 Cable row 3x15  Cable extension 3x15   Trigger Point Dry-Needling  Treatment instructions: Expect mild to moderate muscle soreness. S/S of pneumothorax if dry needled over a lung field, and to seek immediate medical attention should they occur. Patient verbalized understanding of these instructions and education.  Patient Consent Given: Yes Education handout provided: Yes Muscles treated: bilateral glut med 2x each side  Electrical stimulation performed: No Parameters: N/A Treatment response/outcome: great twitch   Manual: trigger point release to gluteals       PATIENT EDUCATION:  Education details: exercise form/rationale Person educated: Patient Education method: Explanation Education comprehension: verbalized understanding   HOME EXERCISE PROGRAM: Access Code: [email or web address] URL: [email or web address]   ASSESSMENT:  CLINICAL IMPRESSION: Discussed nearing of POC date and he plans to join the [place] right start program after d/c. Spasm today noted in glut min and TFL and was reduced with manual therapy. Began nu step with higher resistance for glut activation but did need to reduce due to knee pain.    OBJECTIVE IMPAIRMENTS decreased activity tolerance, decreased  balance, decreased endurance, difficulty walking, decreased ROM, decreased strength, and increased muscle spasms.   ACTIVITY LIMITATIONS lifting, bending, squatting, and stairs  PARTICIPATION LIMITATIONS: occupation and yard work  PERSONAL FACTORS Age, Past/current experiences, and 3+ comorbidities: pacemaker, uka right side, recent sciatica   are also affecting patient's functional outcome.   REHAB POTENTIAL: Good  CLINICAL DECISION MAKING: Evolving/moderate complexity  EVALUATION COMPLEXITY: Moderate   GOALS: Goals reviewed with patient? Yes  SHORT TERM GOALS: Target date: 06/22/2022  Patient will increase gross strength of left hip by 5 lbs  Baseline: Goal status: not assessed  2.  Patient will demonstrate full pain free AROM of the left hip  Baseline:  Goal status: achieved  3.  Patient will be independent with base exercise program  Baseline:  Goal status: achieved  4.  Patient will ambulate 300' without an AD  Baseline: I do use a cane to get out of bed first thing in the AM- after I sit with heat I am good to go Goal status: partially met  LONG TERM GOALS: Target date: 07/20/2022   Patient will go up and down steps without increased pain  Baseline: it doesn't feel easy but it's better Goal status: achieved  2.  Patient will perform safe yard work tasks without pain  Baseline: have a guy that comes now, will try again when it cools down Goal status: deferred for now  3.  Patient will walk community distances without increased pain  Baseline: as long as I use heat in the morning, I am good to go.  Goal status: partially met  4. No longer experiencing AM muscle spasm  Baseline: need heat every AM  Goal status: NEW   PLAN: PT FREQUENCY: 2x/week  PT DURATION: 8 weeks  PLANNED INTERVENTIONS: Therapeutic exercises, Therapeutic activity, Neuromuscular re-education, Balance training, Gait training, Patient/Family education, Joint mobilization, Stair training,  Aquatic Therapy, Dry Needling, Spinal mobilization, Cryotherapy, Moist heat, scar mobilization, and Ionotophoresis 92m/ml Dexamethasone  PLAN FOR NEXT SESSION: prep for d/c  at end of POC. Manual PRN, core activation  Posey Jasmin C. Teniola Tseng PT, DPT 08/15/22 2:05 PM

## 2022-08-18 ENCOUNTER — Encounter (HOSPITAL_BASED_OUTPATIENT_CLINIC_OR_DEPARTMENT_OTHER): Payer: Self-pay | Admitting: Physical Therapy

## 2022-08-18 ENCOUNTER — Ambulatory Visit (HOSPITAL_BASED_OUTPATIENT_CLINIC_OR_DEPARTMENT_OTHER): Payer: Medicare Other | Admitting: Physical Therapy

## 2022-08-18 DIAGNOSIS — R2689 Other abnormalities of gait and mobility: Secondary | ICD-10-CM

## 2022-08-18 DIAGNOSIS — Z96642 Presence of left artificial hip joint: Secondary | ICD-10-CM

## 2022-08-18 DIAGNOSIS — M25652 Stiffness of left hip, not elsewhere classified: Secondary | ICD-10-CM

## 2022-08-18 DIAGNOSIS — M25552 Pain in left hip: Secondary | ICD-10-CM

## 2022-08-18 NOTE — Therapy (Signed)
+  OUTPATIENT PHYSICAL THERAPY LOWER EXTREMITY TREATMENT     Patient Name: Jimmy Palmer MRN: [id number] DOB:1941-04-10, 81 y.o., male Today's Date: 08/18/2022   PT End of Session - 08/18/22 1308     Visit Number 19    Number of Visits 22    Date for PT Re-Evaluation 08/22/22    Authorization Type progress note done at 43    PT Start Time 1308    PT Stop Time 1350    PT Time Calculation (min) 42 min    Activity Tolerance Patient tolerated treatment well    Behavior During Therapy [facility] for tasks assessed/performed                     Past Medical History:  Diagnosis Date   Abnormal electrocardiogram    Abnormal heart sounds    Allergy 2020   upon arrival [facility]   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   Chronic kidney disease 11/07/2021   Depression 2020   receiving appropriate therapy   Herniated lumbar intervertebral disc    Hypertension    Keratosis    benign   Left bundle branch block    Pacemaker    Pure hypercholesterolemia    Sick sinus syndrome (HCC)    Sleep apnea    VT (ventricular tachycardia) ([place])    Past Surgical History:  Procedure Laterality Date   CARDIOVERSION N/A 09/28/2020   Procedure: CARDIOVERSION;  Surgeon: Acie Fredrickson Wonda Cheng, MD;  Location: [place];  Service: Cardiovascular;  Laterality: N/A;   INSERT / REPLACE / REMOVE PACEMAKER     JOINT REPLACEMENT     partial right knee, medial side   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   Procedure: LEFT TOTAL HIP ARTHROPLASTY ANTERIOR APPROACH;  Surgeon: Mcarthur Rossetti, MD;  Location: [facility];  Service: Orthopedics;  Laterality: Left;   VASECTOMY     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   Rectal bleeding 02/01/2022   Left hip pain 02/01/2022   Environmental and seasonal allergies 02/01/2022   Hypertension 11/07/2021   Chronic kidney disease 11/07/2021   Heart  block AV complete ([place]) 08/26/2020   Atrial tachycardia ([place]) 08/26/2020   Pacemaker - MDT 08/26/2020   Persistent atrial fibrillation ([place]) 12/22/2019    PCP: [name], [name], MD  REFERRING PROVIDER: DR Jean Rosenthal   REFERRING DIAG: [phone] (ICD-10-CM) - Status post total replacement of left hip  THERAPY DIAG:  Pain in left hip  Status post total replacement of left hip  Other abnormalities of gait and mobility  Stiffness of left hip, not elsewhere classified  Rationale for Evaluation and Treatment Rehabilitation  ONSET DATE: May 05, 2022  SUBJECTIVE:   SUBJECTIVE STATEMENT: I had some of the spasm in my hips this morning. Ok after I put heat on it.   PERTINENT HISTORY: CKD, HTN, Pacemaker, A-fib  PAIN:  Are you having pain? Yes: NPRS scale: 3/10 Pain location: L hip Pain description: Persistent ache anterolateral hip, posterior spasms Aggravating factors: Prolonged positioning, bending, lifting, stairs Relieving factors: Rest, Tylenol, heat  PRECAUTIONS: Anterior hip  WEIGHT BEARING RESTRICTIONS No  FALLS:  Has patient fallen in last 6 months? No  LIVING ENVIRONMENT: Lives with: lives with their spouse Lives in: House/apartment Stairs: Yes: External: 3 steps; on right going up Has following equipment at  home: Single point cane - since surgery  OCCUPATION: Retired  PLOF: Independent  PATIENT GOALS  Wants to get out for yard work and community activity.   OBJECTIVE:   PATIENT SURVEYS:  FOTO 43% ability  68% expected in 13 visits   8/24: 57   POSTURE: No Significant postural limitations   PALPATION: Mild scar tissue around the scar.   LOWER EXTREMITY ROM:  Passive ROM Right eval Left 8/15  Hip flexion  120 no pain   Hip extension    Hip abduction    Hip adduction    Hip internal rotation  Not pushed to end range but pain free   Hip external rotation  Not pushed to end range but pain free  Knee flexion    Knee extension     Ankle dorsiflexion    Ankle plantarflexion    Ankle inversion    Ankle eversion     (Blank rows = not tested)  LOWER EXTREMITY MMT:  MMT Right eval Left eval  Hip flexion 36 25  Hip extension    Hip abduction 60.5 38.5 not tested this visit   Hip adduction 40.8 49.8  Hip internal rotation    Hip external rotation    Knee flexion    Knee extension    Ankle dorsiflexion    Ankle plantarflexion    Ankle inversion    Ankle eversion     (Blank rows = not tested)  Gait 8/15: improved lateral movement with gait    TODAY'S TREATMENT: 9/22: Exercises - Supine Bridge  - 1 x daily - 7 x weekly - 3 sets - 10 reps - Sidelying Hip Abduction  - 1 x daily - 7 x weekly - 1 sets - 10 reps - Sidelying Hip Circles  - 1 x daily - 7 x weekly - 3 sets - 10 reps - Seated Piriformis Stretch  - 1 x daily - 7 x weekly - 2 sets - 3 breaths hold - Seated Hamstring Stretch  - 1 x daily - 7 x weekly - 2 sets - 3 breaths hold - Standing Gastroc Stretch  - 1 x daily - 7 x weekly - 2 sets - 3 breaths hold - Sit to Stand with Arms Crossed  - 1 x daily - 7 x weekly - 3 sets - 10 reps - Standing March with Counter Support  - 1 x daily - 7 x weekly - 3 sets - 10 reps - Single Leg Stance on Foam Pad  - 1 x daily - 7 x weekly - 5 sets - 30s hold - Tandem Stance with Head Rotation  - 1 x daily - 7 x weekly - 3 sets - 10 reps - Quadruped Alternating Arm Lift  - 1 x daily - 7 x weekly - 2 sets - 10 reps - 2s hold - Quadruped Hip Extension Kicks  - 1 x daily - 7 x weekly - 2 sets - 10 reps - 2s hold   9/19 MANUAL: STM Rt glut min & TFL Nus tep 4 min L6 Wall sits Cable machine- post stepping, squatting, side stepping   9/15 X-ride seate 12 L1 5 min  #1 Lifestyles 30 3x15  Life styles hamstring 10 lbs 3x15 Cable row 3x15  Cable extension 3x15   Trigger Point Dry-Needling  Treatment instructions: Expect mild to moderate muscle soreness. S/S of pneumothorax if dry needled over a lung field, and to seek  immediate medical attention should they occur. Patient verbalized understanding  of these instructions and education.  Patient Consent Given: Yes Education handout provided: Yes Muscles treated: bilateral glut med 2x each side  Electrical stimulation performed: No Parameters: N/A Treatment response/outcome: great twitch   Manual: trigger point release to gluteals       PATIENT EDUCATION:  Education details: exercise form/rationale Person educated: Patient Education method: Explanation Education comprehension: verbalized understanding   HOME EXERCISE PROGRAM: Access Code: [email or web address] URL: [email or web address]   ASSESSMENT:  CLINICAL IMPRESSION: Condensed HEP and added progressions. Provided 2 printouts so he has 1 at home and 1 at [facility] to keep with his workout logs. Discussed anatomy of SIJ and how it could be contributing to his AM pain.    OBJECTIVE IMPAIRMENTS decreased activity tolerance, decreased balance, decreased endurance, difficulty walking, decreased ROM, decreased strength, and increased muscle spasms.   ACTIVITY LIMITATIONS lifting, bending, squatting, and stairs  PARTICIPATION LIMITATIONS: occupation and yard work  PERSONAL FACTORS Age, Past/current experiences, and 3+ comorbidities: pacemaker, uka right side, recent sciatica   are also affecting patient's functional outcome.   REHAB POTENTIAL: Good  CLINICAL DECISION MAKING: Evolving/moderate complexity  EVALUATION COMPLEXITY: Moderate   GOALS: Goals reviewed with patient? Yes  SHORT TERM GOALS: Target date: 06/22/2022  Patient will increase gross strength of left hip by 5 lbs  Baseline: Goal status: not assessed  2.  Patient will demonstrate full pain free AROM of the left hip  Baseline:  Goal status: achieved  3.  Patient will be independent with base exercise program  Baseline:  Goal status: achieved  4.  Patient will ambulate 300' without an AD  Baseline: I do use a cane  to get out of bed first thing in the AM- after I sit with heat I am good to go Goal status: partially met  LONG TERM GOALS: Target date: 07/20/2022   Patient will go up and down steps without increased pain  Baseline: it doesn't feel easy but it's better Goal status: achieved  2.  Patient will perform safe yard work tasks without pain  Baseline: have a guy that comes now, will try again when it cools down Goal status: deferred for now  3.  Patient will walk community distances without increased pain  Baseline: as long as I use heat in the morning, I am good to go.  Goal status: partially met  4. No longer experiencing AM muscle spasm  Baseline: need heat every AM  Goal status: NEW   PLAN: PT FREQUENCY: 2x/week  PT DURATION: 8 weeks  PLANNED INTERVENTIONS: Therapeutic exercises, Therapeutic activity, Neuromuscular re-education, Balance training, Gait training, Patient/Family education, Joint mobilization, Stair training, Aquatic Therapy, Dry Needling, Spinal mobilization, Cryotherapy, Moist heat, scar mobilization, and Ionotophoresis 4mg /ml Dexamethasone  PLAN FOR NEXT SESSION: prep for d/c  at end of POC. Manual PRN, core activation  Pavlos Yon C. Ihan Pat PT, DPT 08/18/22 2:00 PM

## 2022-08-22 ENCOUNTER — Ambulatory Visit (HOSPITAL_BASED_OUTPATIENT_CLINIC_OR_DEPARTMENT_OTHER): Payer: Medicare Other | Admitting: Physical Therapy

## 2022-08-22 ENCOUNTER — Encounter (HOSPITAL_BASED_OUTPATIENT_CLINIC_OR_DEPARTMENT_OTHER): Payer: Self-pay | Admitting: Physical Therapy

## 2022-08-22 DIAGNOSIS — M25552 Pain in left hip: Secondary | ICD-10-CM

## 2022-08-22 DIAGNOSIS — R2689 Other abnormalities of gait and mobility: Secondary | ICD-10-CM

## 2022-08-22 NOTE — Therapy (Signed)
+  OUTPATIENT PHYSICAL THERAPY LOWER EXTREMITY TREATMENT     Patient Name: Jimmy Palmer MRN: [id number] DOB:Nov 07, 1941, 81 y.o., male Today's Date: 08/22/2022   PT End of Session - 08/22/22 1404     Visit Number 20    Number of Visits 22    Date for PT Re-Evaluation 08/22/22    Authorization Type progress note done at 27    PT Start Time 1320    PT Stop Time 5053    PT Time Calculation (min) 38 min    Activity Tolerance Patient tolerated treatment well    Behavior During Therapy [facility] for tasks assessed/performed                      Past Medical History:  Diagnosis Date   Abnormal electrocardiogram    Abnormal heart sounds    Allergy 2020   upon arrival to [facility]   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   Chronic kidney disease 11/07/2021   Depression 2020   receiving appropriate therapy   Herniated lumbar intervertebral disc    Hypertension    Keratosis    benign   Left bundle branch block    Pacemaker    Pure hypercholesterolemia    Sick sinus syndrome (HCC)    Sleep apnea    VT (ventricular tachycardia) ([place])    Past Surgical History:  Procedure Laterality Date   CARDIOVERSION N/A 09/28/2020   Procedure: CARDIOVERSION;  Surgeon: Acie Fredrickson Wonda Cheng, MD;  Location: [place];  Service: Cardiovascular;  Laterality: N/A;   INSERT / REPLACE / REMOVE PACEMAKER     JOINT REPLACEMENT     partial right knee, medial side   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   Procedure: LEFT TOTAL HIP ARTHROPLASTY ANTERIOR APPROACH;  Surgeon: Mcarthur Rossetti, MD;  Location: [facility];  Service: Orthopedics;  Laterality: Left;   VASECTOMY     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   Rectal bleeding 02/01/2022   Left hip pain 02/01/2022   Environmental and seasonal allergies 02/01/2022   Hypertension 11/07/2021   Chronic kidney disease 11/07/2021   Heart  block AV complete ([place]) 08/26/2020   Atrial tachycardia ([place]) 08/26/2020   Pacemaker - MDT 08/26/2020   Persistent atrial fibrillation ([place]) 12/22/2019    PCP: [name], [name], MD  REFERRING PROVIDER: DR Jean Rosenthal   REFERRING DIAG: [phone] (ICD-10-CM) - Status post total replacement of left hip  THERAPY DIAG:  Pain in left hip  Other abnormalities of gait and mobility  Rationale for Evaluation and Treatment Rehabilitation  ONSET DATE: May 05, 2022  SUBJECTIVE:  Progress Note Reporting Period 07/11/22 to 08/22/22  See note below for Objective Data and Assessment of Progress/Goals.     SUBJECTIVE STATEMENT: No muscle spasm int he last few days.   PERTINENT HISTORY: CKD, HTN, Pacemaker, A-fib  PAIN:  Are you having pain? Yes: NPRS scale: 0/10 Pain location: L hip Pain description: Persistent ache anterolateral hip, posterior spasms Aggravating factors: Prolonged positioning, bending, lifting, stairs Relieving factors: Rest, Tylenol, heat  PRECAUTIONS: Anterior hip  WEIGHT BEARING RESTRICTIONS No  FALLS:  Has patient fallen in last 6 months? No  LIVING ENVIRONMENT: Lives with: lives with their spouse Lives in: House/apartment Stairs: Yes: External: 3 steps; on right going up Has following equipment at home: Single point cane -  since surgery  OCCUPATION: Retired  PLOF: Independent  PATIENT GOALS  Wants to get out for yard work and community activity.   OBJECTIVE:   PATIENT SURVEYS:  FOTO 43% ability  68% expected in 13 visits   8/24: 57   POSTURE: No Significant postural limitations   PALPATION: Mild scar tissue around the scar.   LOWER EXTREMITY ROM:  Passive ROM Right eval Left 8/15  Hip flexion  120 no pain   Hip extension    Hip abduction    Hip adduction    Hip internal rotation  Not pushed to end range but pain free   Hip external rotation  Not pushed to end range but pain free  Knee flexion    Knee extension    Ankle  dorsiflexion    Ankle plantarflexion    Ankle inversion    Ankle eversion     (Blank rows = not tested)  LOWER EXTREMITY MMT:  MMT Right eval Left eval  Hip flexion 36 25  Hip extension    Hip abduction 60.5 38.5 not tested this visit   Hip adduction 40.8 49.8  Hip internal rotation    Hip external rotation    Knee flexion    Knee extension    Ankle dorsiflexion    Ankle plantarflexion    Ankle inversion    Ankle eversion     (Blank rows = not tested)  Gait 8/15: improved lateral movement with gait    TODAY'S TREATMENT: 9/26: 8 min x-bike Bridge with ball Sidelying hip abd, hip circles Sit<>stand Slow march SLS balance  9/22: Exercises - Supine Bridge  - 1 x daily - 7 x weekly - 3 sets - 10 reps - Sidelying Hip Abduction  - 1 x daily - 7 x weekly - 1 sets - 10 reps - Sidelying Hip Circles  - 1 x daily - 7 x weekly - 3 sets - 10 reps - Seated Piriformis Stretch  - 1 x daily - 7 x weekly - 2 sets - 3 breaths hold - Seated Hamstring Stretch  - 1 x daily - 7 x weekly - 2 sets - 3 breaths hold - Standing Gastroc Stretch  - 1 x daily - 7 x weekly - 2 sets - 3 breaths hold - Sit to Stand with Arms Crossed  - 1 x daily - 7 x weekly - 3 sets - 10 reps - Standing March with Counter Support  - 1 x daily - 7 x weekly - 3 sets - 10 reps - Single Leg Stance on Foam Pad  - 1 x daily - 7 x weekly - 5 sets - 30s hold - Tandem Stance with Head Rotation  - 1 x daily - 7 x weekly - 3 sets - 10 reps - Quadruped Alternating Arm Lift  - 1 x daily - 7 x weekly - 2 sets - 10 reps - 2s hold - Quadruped Hip Extension Kicks  - 1 x daily - 7 x weekly - 2 sets - 10 reps - 2s hold   9/19 MANUAL: STM Rt glut min & TFL Nus tep 4 min L6 Wall sits Cable machine- post stepping, squatting, side stepping   9/15 X-ride seate 12 L1 5 min  #1 Lifestyles 30 3x15  Life styles hamstring 10 lbs 3x15 Cable row 3x15  Cable extension 3x15   Trigger Point Dry-Needling  Treatment instructions:  Expect mild to moderate muscle soreness. S/S of pneumothorax if dry needled over a lung  field, and to seek immediate medical attention should they occur. Patient verbalized understanding of these instructions and education.  Patient Consent Given: Yes Education handout provided: Yes Muscles treated: bilateral glut med 2x each side  Electrical stimulation performed: No Parameters: N/A Treatment response/outcome: great twitch   Manual: trigger point release to gluteals       PATIENT EDUCATION:  Education details: exercise form/rationale Person educated: Patient Education method: Explanation Education comprehension: verbalized understanding   HOME EXERCISE PROGRAM: Access Code: [email or web address] URL: [email or web address]   ASSESSMENT:  CLINICAL IMPRESSION: Tolerated exercises well today and will continue with this HEP. Does plan to join the right start program with [facility] after his last PT appt.    OBJECTIVE IMPAIRMENTS decreased activity tolerance, decreased balance, decreased endurance, difficulty walking, decreased ROM, decreased strength, and increased muscle spasms.   ACTIVITY LIMITATIONS lifting, bending, squatting, and stairs  PARTICIPATION LIMITATIONS: occupation and yard work  PERSONAL FACTORS Age, Past/current experiences, and 3+ comorbidities: pacemaker, uka right side, recent sciatica   are also affecting patient's functional outcome.   REHAB POTENTIAL: Good  CLINICAL DECISION MAKING: Evolving/moderate complexity  EVALUATION COMPLEXITY: Moderate   GOALS: Goals reviewed with patient? Yes  SHORT TERM GOALS: Target date: 06/22/2022  Patient will increase gross strength of left hip by 5 lbs  Baseline: Goal status: not assessed  2.  Patient will demonstrate full pain free AROM of the left hip  Baseline:  Goal status: achieved  3.  Patient will be independent with base exercise program  Baseline:  Goal status: achieved  4.  Patient will  ambulate 300' without an AD  Baseline: I do use a cane to get out of bed first thing in the AM- after I sit with heat I am good to go Goal status: partially met  LONG TERM GOALS: Target date: 07/20/2022   Patient will go up and down steps without increased pain  Baseline: it doesn't feel easy but it's better Goal status: achieved  2.  Patient will perform safe yard work tasks without pain  Baseline: have a guy that comes now, will try again when it cools down Goal status: deferred for now  3.  Patient will walk community distances without increased pain  Baseline: as long as I use heat in the morning, I am good to go.  Goal status: partially met  4. No longer experiencing AM muscle spasm  Baseline: need heat every AM  Goal status: NEW   PLAN: PT FREQUENCY: 2x/week  PT DURATION: 8 weeks  PLANNED INTERVENTIONS: Therapeutic exercises, Therapeutic activity, Neuromuscular re-education, Balance training, Gait training, Patient/Family education, Joint mobilization, Stair training, Aquatic Therapy, Dry Needling, Spinal mobilization, Cryotherapy, Moist heat, scar mobilization, and Ionotophoresis 4mg /ml Dexamethasone  PLAN FOR NEXT SESSION: gym machine review PRN & D/c  Ziva Nunziata C. Vernard Gram PT, DPT 08/22/22 2:04 PM

## 2022-08-24 ENCOUNTER — Encounter (HOSPITAL_BASED_OUTPATIENT_CLINIC_OR_DEPARTMENT_OTHER): Payer: Medicare Other | Admitting: Physical Therapy

## 2022-08-30 ENCOUNTER — Encounter (HOSPITAL_BASED_OUTPATIENT_CLINIC_OR_DEPARTMENT_OTHER): Payer: Self-pay | Admitting: Physical Therapy

## 2022-08-30 ENCOUNTER — Ambulatory Visit (HOSPITAL_BASED_OUTPATIENT_CLINIC_OR_DEPARTMENT_OTHER): Payer: Medicare Other | Attending: Orthopaedic Surgery | Admitting: Physical Therapy

## 2022-08-30 DIAGNOSIS — M25652 Stiffness of left hip, not elsewhere classified: Secondary | ICD-10-CM | POA: Insufficient documentation

## 2022-08-30 DIAGNOSIS — R2689 Other abnormalities of gait and mobility: Secondary | ICD-10-CM | POA: Diagnosis present

## 2022-08-30 DIAGNOSIS — M25552 Pain in left hip: Secondary | ICD-10-CM | POA: Insufficient documentation

## 2022-08-30 DIAGNOSIS — Z96642 Presence of left artificial hip joint: Secondary | ICD-10-CM | POA: Diagnosis present

## 2022-08-30 NOTE — Therapy (Signed)
+  OUTPATIENT PHYSICAL THERAPY LOWER EXTREMITY TREATMENT/Discharge      Patient Name: Jimmy Palmer MRN: [id number] DOB:January 19, 1941, 81 y.o., male Today's Date: 08/31/2022   PT End of Session - 08/30/22 1353     Visit Number 21    Number of Visits 22    Date for PT Re-Evaluation 08/22/22    Authorization Type progress note done at 62    PT Start Time 1345    PT Stop Time 1428    PT Time Calculation (min) 43 min    Activity Tolerance Patient tolerated treatment well    Behavior During Therapy [facility] for tasks assessed/performed                      Past Medical History:  Diagnosis Date   Abnormal electrocardiogram    Abnormal heart sounds    Allergy 2020   upon arrival to [facility]   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   Chronic kidney disease 11/07/2021   Depression 2020   receiving appropriate therapy   Herniated lumbar intervertebral disc    Hypertension    Keratosis    benign   Left bundle branch block    Pacemaker    Pure hypercholesterolemia    Sick sinus syndrome (HCC)    Sleep apnea    VT (ventricular tachycardia) ([place])    Past Surgical History:  Procedure Laterality Date   CARDIOVERSION N/A 09/28/2020   Procedure: CARDIOVERSION;  Surgeon: Acie Fredrickson Wonda Cheng, MD;  Location: [place];  Service: Cardiovascular;  Laterality: N/A;   INSERT / REPLACE / REMOVE PACEMAKER     JOINT REPLACEMENT     partial right knee, medial side   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   Procedure: LEFT TOTAL HIP ARTHROPLASTY ANTERIOR APPROACH;  Surgeon: Mcarthur Rossetti, MD;  [facility];  Service: Orthopedics;  Laterality: Left;   VASECTOMY     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   Rectal bleeding 02/01/2022   Left hip pain 02/01/2022   Environmental and seasonal allergies 02/01/2022   Hypertension 11/07/2021   Chronic kidney disease 11/07/2021    Heart block AV complete ([place]) 08/26/2020   Atrial tachycardia 08/26/2020   Pacemaker - MDT 08/26/2020   Persistent atrial fibrillation ([place]) 12/22/2019    PCP: [name], [name], MD  REFERRING PROVIDER: DR Jean Rosenthal   REFERRING DIAG: [phone] (ICD-10-CM) - Status post total replacement of left hip  THERAPY DIAG:  Pain in left hip  Other abnormalities of gait and mobility  Status post total replacement of left hip  Stiffness of left hip, not elsewhere classified  Rationale for Evaluation and Treatment Rehabilitation  ONSET DATE: May 05, 2022  SUBJECTIVE:      SUBJECTIVE STATEMENT: The patient is making good progress. He will be joining our gym transition program. He has had very little spasms. He has been doing well.   PERTINENT HISTORY: CKD, HTN, Pacemaker, A-fib  PAIN:  Are you having pain? Yes: NPRS scale: 0/10 Pain location: L hip Pain description: Persistent ache anterolateral hip, posterior spasms Aggravating factors: Prolonged positioning, bending, lifting, stairs Relieving factors: Rest, Tylenol, heat  PRECAUTIONS: Anterior hip  WEIGHT BEARING RESTRICTIONS No  FALLS:  Has patient fallen in last 6 months? No  LIVING ENVIRONMENT: Lives with: lives with their spouse Lives in: House/apartment Stairs: Yes:  External: 3 steps; on right going up Has following equipment at home: Single point cane - since surgery  OCCUPATION: Retired  PLOF: [place]  Wants to get out for yard work and community activity.   OBJECTIVE:   PATIENT SURVEYS:  FOTO 43% ability  68% expected in 13 visits   8/24: 57   POSTURE: No Significant postural limitations   PALPATION: Mild scar tissue around the scar.   LOWER EXTREMITY ROM:  Passive ROM Right eval Left 8/15  Hip flexion  120 no pain   Hip extension    Hip abduction    Hip adduction    Hip internal rotation  Not pushed to end range but pain free   Hip external rotation  Not  pushed to end range but pain free  Knee flexion    Knee extension    Ankle dorsiflexion    Ankle plantarflexion    Ankle inversion    Ankle eversion     (Blank rows = not tested)  LOWER EXTREMITY MMT:  MMT Right eval Left eval  Hip flexion 36 25  Hip extension    Hip abduction 60.5 38.5 not tested this visit   Hip adduction 40.8 49.8  Hip internal rotation    Hip external rotation    Knee flexion    Knee extension    Ankle dorsiflexion    Ankle plantarflexion    Ankle inversion    Ankle eversion     (Blank rows = not tested)  Gait 8/15: improved lateral movement with gait    TODAY'S TREATMENT: 10/4 Nu-step 6 min L4 Bridge 3x10  Supine march 3x10  Reviewed results of FOTO  Supine hip abduction 3x10 red   Reviewed  complete HEP;  Reviewed gym equipment and set up   Leg press 3x10 25 lbs  Hip abdcution 3x10 40 lbs  Reviewed row and extension for HEP Cable 10 lbs       9/26: 8 min x-bike Bridge with ball Sidelying hip abd, hip circles Sit<>stand Slow march SLS balance  9/22: Exercises - Supine Bridge  - 1 x daily - 7 x weekly - 3 sets - 10 reps - Sidelying Hip Abduction  - 1 x daily - 7 x weekly - 1 sets - 10 reps - Sidelying Hip Circles  - 1 x daily - 7 x weekly - 3 sets - 10 reps - Seated Piriformis Stretch  - 1 x daily - 7 x weekly - 2 sets - 3 breaths hold - Seated Hamstring Stretch  - 1 x daily - 7 x weekly - 2 sets - 3 breaths hold - Standing Gastroc Stretch  - 1 x daily - 7 x weekly - 2 sets - 3 breaths hold - Sit to Stand with Arms Crossed  - 1 x daily - 7 x weekly - 3 sets - 10 reps - Standing March with Counter Support  - 1 x daily - 7 x weekly - 3 sets - 10 reps - Single Leg Stance on Foam Pad  - 1 x daily - 7 x weekly - 5 sets - 30s hold - Tandem Stance with Head Rotation  - 1 x daily - 7 x weekly - 3 sets - 10 reps - Quadruped Alternating Arm Lift  - 1 x daily - 7 x weekly - 2 sets - 10 reps - 2s hold - Quadruped Hip Extension Kicks  -  1 x daily - 7 x weekly - 2 sets -  10 reps - 2s hold   9/19 MANUAL: STM Rt glut min & TFL Nus tep 4 min L6 Wall sits Cable machine- post stepping, squatting, side stepping   9/15 X-ride seate 12 L1 5 min  #1 Lifestyles 30 3x15  Life styles hamstring 10 lbs 3x15 Cable row 3x15  Cable extension 3x15   Trigger Point Dry-Needling  Treatment instructions: Expect mild to moderate muscle soreness. S/S of pneumothorax if dry needled over a lung field, and to seek immediate medical attention should they occur. Patient verbalized understanding of these instructions and education.  Patient Consent Given: Yes Education handout provided: Yes Muscles treated: bilateral glut med 2x each side  Electrical stimulation performed: No Parameters: N/A Treatment response/outcome: great twitch   Manual: trigger point release to gluteals       PATIENT EDUCATION:  Education details: exercise form/rationale Person educated: Patient Education method: Explanation Education comprehension: verbalized understanding   HOME EXERCISE PROGRAM: Access Code: [email or web address] URL: [email or web address]   ASSESSMENT:  CLINICAL IMPRESSION: The patient has made great progress overall. He is walking with minimal antalgic gait. At times he has back spasms in the morning but these have decreased over time. We reviewed his complete HEP today. He will also be transitioning to our [place] right start program program. See below for goal specific progress.   OBJECTIVE IMPAIRMENTS decreased activity tolerance, decreased balance, decreased endurance, difficulty walking, decreased ROM, decreased strength, and increased muscle spasms.   ACTIVITY LIMITATIONS lifting, bending, squatting, and stairs  PARTICIPATION LIMITATIONS: occupation and yard work  PERSONAL FACTORS Age, Past/current experiences, and 3+ comorbidities: pacemaker, uka right side, recent sciatica   are also affecting patient's functional  outcome.   REHAB POTENTIAL: Good  CLINICAL DECISION MAKING: Evolving/moderate complexity  EVALUATION COMPLEXITY: Moderate   GOALS: Goals reviewed with patient? Yes  SHORT TERM GOALS: Target date: 06/22/2022  Patient will increase gross strength of left hip by 5 lbs  Baseline: Goal status: achived   2.  Patient will demonstrate full pain free AROM of the left hip  Baseline:  Goal status: achieved  3.  Patient will be independent with base exercise program  Baseline:  Goal status: achieved  4.  Patient will ambulate 300' without an AD  Baseline: I do use a cane to get out of bed first thing in the AM- after I sit with heat I am good to go Goal status: partially met  LONG TERM GOALS: Target date: 07/20/2022   Patient will go up and down steps without increased pain  Baseline: it doesn't feel easy but it's better Goal status: achieved  2.  Patient will perform safe yard work tasks without pain  Baseline: have a guy that comes now, will try again when it cools down Goal status: deferred for now  3.  Patient will walk community distances without increased pain  Baseline: as long as I use heat in the morning, I am good to go.  Goal status:achieved   4. No longer experiencing AM muscle spasm  Baseline: need heat every AM  Goal status: Significant improvement. Mild from time to time    PLAN: PT FREQUENCY: 2x/week  PT DURATION: 8 weeks  PLANNED INTERVENTIONS: Therapeutic exercises, Therapeutic activity, Neuromuscular re-education, Balance training, Gait training, Patient/Family education, Joint mobilization, Stair training, Aquatic Therapy, Dry Needling, Spinal mobilization, Cryotherapy, Moist heat, scar mobilization, and Ionotophoresis 4mg /ml Dexamethasone  PLAN FOR NEXT SESSION: gym machine review PRN & D/c  Carolyne Littles PT DPT  08/31/22 2:03 PM

## 2022-08-31 ENCOUNTER — Encounter (HOSPITAL_BASED_OUTPATIENT_CLINIC_OR_DEPARTMENT_OTHER): Payer: Self-pay | Admitting: Physical Therapy

## 2022-09-08 NOTE — Telephone Encounter (Signed)
Pt called and requested a referral.

## 2022-09-28 ENCOUNTER — Other Ambulatory Visit (HOSPITAL_BASED_OUTPATIENT_CLINIC_OR_DEPARTMENT_OTHER): Payer: Self-pay | Admitting: Family Medicine

## 2022-10-02 ENCOUNTER — Other Ambulatory Visit: Payer: Self-pay

## 2022-10-02 MED ORDER — APIXABAN 5 MG PO TABS
5.0000 mg | ORAL_TABLET | Freq: Two times a day (BID) | ORAL | 1 refills | Status: DC
  Filled 2022-12-28: qty 120, 60d supply, fill #0
  Filled 2023-02-20: qty 54, 27d supply, fill #1

## 2022-10-02 NOTE — Telephone Encounter (Signed)
Prescription refill request for Eliquis received. Indication:afib Last office visit:1/23 Scr:1.2 Age: 81 Weight:80 kg  Prescription refilled

## 2022-10-06 ENCOUNTER — Telehealth: Payer: Self-pay | Admitting: Pharmacist

## 2022-10-06 ENCOUNTER — Other Ambulatory Visit: Payer: Self-pay

## 2022-10-06 NOTE — Telephone Encounter (Signed)
Patient dropped off paper that authorization for Eliquis needs to be renewed. Called the # on the paper. Auth # [id number] 09/06/22-10/06/23 Days supply 60 for 30 days VM left per DPR.

## 2022-10-12 ENCOUNTER — Other Ambulatory Visit (HOSPITAL_BASED_OUTPATIENT_CLINIC_OR_DEPARTMENT_OTHER): Payer: Self-pay | Admitting: Orthopaedic Surgery

## 2022-10-12 ENCOUNTER — Telehealth: Payer: Self-pay | Admitting: Orthopaedic Surgery

## 2022-10-12 ENCOUNTER — Other Ambulatory Visit (HOSPITAL_BASED_OUTPATIENT_CLINIC_OR_DEPARTMENT_OTHER): Payer: Self-pay

## 2022-10-12 MED ORDER — METHOCARBAMOL 500 MG PO TABS
500.0000 mg | ORAL_TABLET | Freq: Four times a day (QID) | ORAL | 3 refills | Status: DC
  Filled 2022-10-12: qty 30, 8d supply, fill #0
  Filled 2022-10-29: qty 30, 8d supply, fill #1
  Filled 2022-11-13: qty 30, 8d supply, fill #2
  Filled 2022-11-25: qty 30, 8d supply, fill #3

## 2022-10-12 NOTE — Telephone Encounter (Signed)
Pt called requesting a muscle relaxer for back pains. [facility] is [facility]. Please call pt at [phone].

## 2022-10-30 ENCOUNTER — Ambulatory Visit (INDEPENDENT_AMBULATORY_CARE_PROVIDER_SITE_OTHER): Payer: Medicare Other | Admitting: Orthopaedic Surgery

## 2022-10-30 ENCOUNTER — Ambulatory Visit (INDEPENDENT_AMBULATORY_CARE_PROVIDER_SITE_OTHER): Payer: Medicare Other

## 2022-10-30 ENCOUNTER — Other Ambulatory Visit (HOSPITAL_BASED_OUTPATIENT_CLINIC_OR_DEPARTMENT_OTHER): Payer: Self-pay

## 2022-10-30 ENCOUNTER — Encounter: Payer: Self-pay | Admitting: Orthopaedic Surgery

## 2022-10-30 DIAGNOSIS — M545 Low back pain, unspecified: Secondary | ICD-10-CM

## 2022-10-30 DIAGNOSIS — G8929 Other chronic pain: Secondary | ICD-10-CM | POA: Diagnosis not present

## 2022-10-30 DIAGNOSIS — Z96642 Presence of left artificial hip joint: Secondary | ICD-10-CM | POA: Diagnosis not present

## 2022-10-30 NOTE — Progress Notes (Signed)
The patient is now 6 months status post a left total hip arthroplasty.  He is an active 81 year old gentleman and said the hip is doing great.  He still has a lot of muscle spasms in his spine.  He reports a remote history of epidural steroid injections and other back injections that have not really helped.  He has been through physical therapy.  He says his biggest complaint is the pain and stiffness in the morning but heat and Voltaren gel help and that all he really needs right now.  He is still requesting an x-ray of his lumbar spine.  He is walking with a normal gait.  His leg lengths are equal.  Both hips move smoothly and fluidly.  His left operative hip seem to be doing great.  He only has pain in the lumbar spine at the lower levels and at the midline and just lateral to the midline on both sides.  He has negative straight leg raise bilaterally and excellent strength in his legs.  An AP pelvis and lateral of the left operative hip shows a well-seated total hip arthroplasty with no complicating features.  His right hip which is the native hip appears normal.  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  Since he is doing well enough right now with just an occasional methocarbamol and the heat as well as Voltaren gel for his back, I would not recommend anything else unless things worsen for him.  If that does for his back he will let us know.  All questions concerns were answered and addressed.  Follow-up is as needed.

## 2022-10-31 ENCOUNTER — Other Ambulatory Visit (HOSPITAL_BASED_OUTPATIENT_CLINIC_OR_DEPARTMENT_OTHER): Payer: Self-pay | Admitting: Family Medicine

## 2022-11-02 ENCOUNTER — Telehealth: Payer: Self-pay | Admitting: Orthopaedic Surgery

## 2022-11-02 NOTE — Telephone Encounter (Signed)
Tried calling to advise Dr Steward Drone out of office until next wee, no answer. Next available is a little ways out.  Please advise if ok to work in sooner, there are some new patient slots available we could use.

## 2022-11-02 NOTE — Telephone Encounter (Signed)
Patient advised he need an appt ASAP due to his back pain..please call [phone]

## 2022-11-13 ENCOUNTER — Other Ambulatory Visit (HOSPITAL_BASED_OUTPATIENT_CLINIC_OR_DEPARTMENT_OTHER): Payer: Self-pay

## 2022-11-17 ENCOUNTER — Ambulatory Visit (INDEPENDENT_AMBULATORY_CARE_PROVIDER_SITE_OTHER): Payer: Medicare Other | Admitting: Orthopaedic Surgery

## 2022-11-17 DIAGNOSIS — M4316 Spondylolisthesis, lumbar region: Secondary | ICD-10-CM

## 2022-11-17 NOTE — Progress Notes (Signed)
Chief Complaint: Lower back pain     History of Present Illness:   11/17/2022: Patient presents today for follow-up of lower back pain which is radiating to bilateral lower extremities.  This is continued to worsen.  He has trialed Robaxin as well as physical therapy at this time.  Physical therapy was helpful in the past.  He does have a previous history of an epidural injection which gave him some relief.  ONEAL SCHOENBERGER is a 81 y.o. male presents today 1 month status post left total hip arthroplasty with Dr. Magnus Ivan presents with lower back pain as well as bilateral SI pain.  He states that he is taking baclofen although he is having some side effects from this so he is discontinued.  He is here today for further assessment.  Overall very happy and satisfied with the quality of his total hip outcome.    Surgical History:   Left total hip arthroplasty June 2023  PMH/PSH/Family History/Social History/Meds/Allergies:    Past Medical History:  Diagnosis Date   Abnormal electrocardiogram    Abnormal heart sounds    Allergy 2020   upon arrival to [facility]   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   Chronic kidney disease 11/07/2021   Depression 2020   receiving appropriate therapy   Herniated lumbar intervertebral disc    Hypertension    Keratosis    benign   Left bundle branch block    Pacemaker    Pure hypercholesterolemia    Sick sinus syndrome (HCC)    Sleep apnea    VT (ventricular tachycardia) (HCC)    Past Surgical History:  Procedure Laterality Date   CARDIOVERSION N/A 09/28/2020   Procedure: CARDIOVERSION;  Surgeon: Nahser, Deloris Ping, MD;  Location: [facility];  Service: Cardiovascular;  Laterality: N/A;   INSERT / REPLACE / REMOVE PACEMAKER     JOINT REPLACEMENT     partial right knee, medial side   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   Procedure: LEFT TOTAL HIP ARTHROPLASTY ANTERIOR APPROACH;  Surgeon: Kathryne Hitch,  MD;  Location: [facility];  Service: Orthopedics;  Laterality: Left;   VASECTOMY     Social History   Socioeconomic History   Marital status: Married    Spouse name: Not on file   Number of children: 2   Years of education: Not on file   Highest education level: Not on file  Occupational History   Not on file  Tobacco Use   Smoking status: Former    Types: Cigars   Smokeless tobacco: Never   Tobacco comments:    Smoked cigars until the early 2000's  Vaping Use   Vaping Use: Never used  Substance and Sexual Activity   Alcohol use: Yes    Alcohol/week: 14.0 standard drinks of alcohol    Types: 7 Glasses of wine, 7 Shots of liquor per week    Comment: 1-2 cocktails every day and 1 glass of wine every day   Drug use: Never   Sexual activity: Not Currently  Other Topics Concern   Not on file  Social History Narrative   Retired Chiropractor   Recently relocated from [place]   Social Determinants of Corporate investment banker Strain: Not on [employer] Insecurity: Not on file  Transportation Needs: Not on file  Physical Activity: Not on file  Stress: Not on file  Social Connections: Not on file   Family History  Problem Relation Age of Onset   Arthritis Mother    Hypertension Mother    Hypertension Father    Allergies  Allergen Reactions   Amoxicillin Diarrhea   Current Outpatient Medications  Medication Sig Dispense Refill   methocarbamol (ROBAXIN) 500 MG tablet Take 1 tablet (500 mg total) by mouth 4 (four) times daily. 30 tablet 3   apixaban (ELIQUIS) 5 MG TABS tablet Take 1 tablet (5 mg total) by mouth 2 (two) times daily. 180 tablet 1   baclofen (LIORESAL) 10 MG tablet TAKE 1 TABLET(10 MG) BY MOUTH THREE TIMES DAILY AS NEEDED FOR MUSCLE SPASMS 30 tablet 0   buPROPion (WELLBUTRIN SR) 150 MG 12 hr tablet TAKE 1 TABLET BY MOUTH EVERY MORNING 240 tablet 1   diclofenac Sodium (VOLTAREN) 1 % GEL Apply 1 application topically as needed (pain).      diphenhydrAMINE  (BENADRYL) 25 mg capsule Take 25 mg by mouth at bedtime.      doxazosin (CARDURA) 4 MG tablet TAKE 1 TABLET BY MOUTH EVERY DAY 90 tablet 0   losartan-hydrochlorothiazide (HYZAAR) 100-12.5 MG tablet TAKE 1 TABLET BY MOUTH EVERY DAY 90 tablet 1   Melatonin 5 MG TABS Take 5 mg by mouth at bedtime.      Multiple Vitamin (MULTIVITAMIN PO) Take 2 tablets by mouth once a week.     oxyCODONE (OXY IR/ROXICODONE) 5 MG immediate release tablet Take 1-2 tablets (5-10 mg total) by mouth every 4 (four) hours as needed for moderate pain (pain score 4-6). (Patient not taking: Reported on 05/25/2022) 30 tablet 0   phenylephrine (,USE FOR PREPARATION-H,) 0.25 % suppository Place 1 suppository rectally 2 (two) times daily as needed for hemorrhoids. 12 suppository 1   sertraline (ZOLOFT) 25 MG tablet TAKE 1 TABLET BY MOUTH EVERY DAY 240 tablet 1   simvastatin (ZOCOR) 40 MG tablet TAKE 1 TABLET BY MOUTH EVERY EVENING 90 tablet 1   triamcinolone (NASACORT) 55 MCG/ACT AERO nasal inhaler Place 2 sprays into the nose daily. (Patient taking differently: Place 2 sprays into the nose daily as needed (allergies).) 1 each 2   No current facility-administered medications for this visit.   No results found.  Review of Systems:   A ROS was performed including pertinent positives and negatives as documented in the HPI.  Physical Exam :   Constitutional: NAD and appears stated age Neurological: Alert and oriented Psych: Appropriate affect and cooperative There were no vitals taken for this visit.   Comprehensive Musculoskeletal Exam:    Tenderness palpation over the lumbar spine paraspinal muscular area as well as bilateral SI joints.  Range of motion about the left hip appears much improved with 30 degrees internal and external rotation.  No pain with this.  Remainder of distal neurosensory exam is intact  Imaging:   Xray (3 views left hip): Status post left hip arthroplasty without complication   I personally  reviewed and interpreted the radiographs.   Assessment:   81 y.o. male status post left total hip arthroplasty femoral nerve prior overall doing very well.  He is experiencing some bilateral SI pain and lower back pain.  There is radiation to the bilateral lower extremities consistent with neurogenic claudication.  I did describe that his x-rays do show spondylolisthesis at the L4-L5 level.  Side effect given the fact that he has previously  trialed therapy injections I do believe spinal consultation and MRI of his lumbar spine will be helpful.  We will plan to refer him to Dr. Sonnie Alamo to see if he is a candidate for any type of spondylolisthesis correction.  Physical therapy ordered which she will begin the first of the year to help to continue to work on this.  Plan :    -Return to clinic as needed     I personally saw and evaluated the patient, and participated in the management and treatment plan.  Huel Cote, MD Attending Physician, Orthopedic Surgery  This document was dictated using Dragon voice recognition software. A reasonable attempt at proof reading has been made to minimize errors.

## 2022-11-22 ENCOUNTER — Telehealth: Payer: Self-pay | Admitting: Orthopaedic Surgery

## 2022-11-22 NOTE — Telephone Encounter (Signed)
Patient called in stating he would like to try Physical therapy for 6 months before he talks to Dr. Christell Constant about Surgery so he had his appt rescheduled for June and the MRI called and cancelled. Please advise

## 2022-11-25 ENCOUNTER — Other Ambulatory Visit (HOSPITAL_BASED_OUTPATIENT_CLINIC_OR_DEPARTMENT_OTHER): Payer: Self-pay

## 2022-11-28 ENCOUNTER — Encounter (HOSPITAL_BASED_OUTPATIENT_CLINIC_OR_DEPARTMENT_OTHER): Payer: Self-pay | Admitting: Physical Therapy

## 2022-11-28 ENCOUNTER — Ambulatory Visit (HOSPITAL_BASED_OUTPATIENT_CLINIC_OR_DEPARTMENT_OTHER): Payer: Medicare Other | Attending: Orthopaedic Surgery | Admitting: Physical Therapy

## 2022-11-28 DIAGNOSIS — M25552 Pain in left hip: Secondary | ICD-10-CM | POA: Insufficient documentation

## 2022-11-28 DIAGNOSIS — M25652 Stiffness of left hip, not elsewhere classified: Secondary | ICD-10-CM | POA: Diagnosis present

## 2022-11-28 DIAGNOSIS — M4316 Spondylolisthesis, lumbar region: Secondary | ICD-10-CM | POA: Diagnosis present

## 2022-11-28 DIAGNOSIS — Z96642 Presence of left artificial hip joint: Secondary | ICD-10-CM | POA: Diagnosis present

## 2022-11-28 DIAGNOSIS — R2689 Other abnormalities of gait and mobility: Secondary | ICD-10-CM | POA: Diagnosis present

## 2022-11-28 NOTE — Therapy (Unsigned)
OUTPATIENT PHYSICAL THERAPY THORACOLUMBAR EVALUATION   Patient Name: Jimmy Palmer MRN: [id number] DOB:03/17/1941, 82 y.o., male Today's Date: 11/29/2022  END OF SESSION:  PT End of Session - 11/29/22 0805     Visit Number 1    Number of Visits 16    Date for PT Re-Evaluation 01/24/23    Authorization Type progress note done at 20    PT Start Time 1515    PT Stop Time 1557    PT Time Calculation (min) 42 min    Activity Tolerance Patient tolerated treatment well    Behavior During Therapy [facility] for tasks assessed/performed             Past Medical History:  Diagnosis Date   Abnormal electrocardiogram    Abnormal heart sounds    Allergy 2020   upon arrival to [facility]   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   Chronic kidney disease 11/07/2021   Depression 2020   receiving appropriate therapy   Herniated lumbar intervertebral disc    Hypertension    Keratosis    benign   Left bundle branch block    Pacemaker    Pure hypercholesterolemia    Sick sinus syndrome (HCC)    Sleep apnea    VT (ventricular tachycardia) (HCC)    Past Surgical History:  Procedure Laterality Date   CARDIOVERSION N/A 09/28/2020   Procedure: CARDIOVERSION;  Surgeon: Elease Hashimoto Deloris Ping, MD;  Location: [facility];  Service: Cardiovascular;  Laterality: N/A;   INSERT / REPLACE / REMOVE PACEMAKER     JOINT REPLACEMENT     partial right knee, medial side   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   Procedure: LEFT TOTAL HIP ARTHROPLASTY ANTERIOR APPROACH;  Surgeon: Kathryne Hitch, MD;  Location: [facility];  Service: Orthopedics;  Laterality: Left;   VASECTOMY     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   Rectal bleeding 02/01/2022   Left hip pain 02/01/2022   Environmental and seasonal allergies 02/01/2022   Hypertension 11/07/2021   Chronic kidney disease 11/07/2021   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   Atrial tachycardia 08/26/2020   Pacemaker - MDT 08/26/2020   Persistent atrial fibrillation (HCC) 12/22/2019    PCP: Dr Raymond De Peru   REFERRING PROVIDER: Dr Huel Cote   REFERRING DIAG:  Diagnosis  M43.16 (ICD-10-CM) - Spondylolisthesis, lumbar region    Rationale for Evaluation and Treatment: Rehabilitation  THERAPY DIAG:  Pain in left hip  Other abnormalities of gait and mobility  Status post total replacement of left hip  Stiffness of left hip, not elsewhere classified  ONSET DATE:   SUBJECTIVE:  SUBJECTIVE STATEMENT: Patient had an onset of lower back pain that has been progressing over the past 3 months. He has spasming in his buttock area that is worse when he first wakes up in the morning. He has been taking Robaxin. He is not sure how much it helped.   PERTINENT HISTORY:  Arthritis, CKD, Herniated intervertebral discs, depression, Left BBB, THA left, VT  PAIN:  Are you having pain? Yes: NPRS scale: 7/10 in the morning  Pain location: Lumbar spine Pain description: aching  Aggravating factors: Mornings  Relieving factors: Heat and voltarin  PRECAUTIONS: None  WEIGHT BEARING RESTRICTIONS: No  FALLS:  Has patient fallen in last 6 months? No  LIVING ENVIRONMENT: Lives with his wife  OCCUPATION: retired form the [employer]  PLOF: Independent  PATIENT GOALS: to have less pain    NEXT MD VISIT:   OBJECTIVE:   DIAGNOSTIC FINDINGS:    PATIENT SURVEYS:  FOTO    SCREENING FOR RED FLAGS: Bowel or bladder incontinence: No Spinal tumors: No Cauda equina syndrome: No Compression fracture: No Abdominal aneurysm: No  COGNITION: Overall cognitive status: Within functional limits for tasks assessed     SENSATION: Denies paresthesias    MUSCLE LENGTH:  POSTURE: No Significant postural limitations  PALPATION: Spasming in the lumbar spine .   LUMBAR ROM:   AROM eval  Flexion Full   Extension Full   Right lateral flexion   Left lateral flexion   Right rotation Limited 25%   Left rotation Limited 25%    (Blank rows = not tested)  LOWER EXTREMITY ROM:     Passive  Right eval Left eval  Hip flexion Limit  No limit  Hip extension    Hip abduction    Hip adduction    Hip internal rotation Painful  Painful   Hip external rotation No limit No limit  Knee flexion    Knee extension    Ankle dorsiflexion    Ankle plantarflexion    Ankle inversion    Ankle eversion     (Blank rows = not tested)  LOWER EXTREMITY MMT:    MMT Right eval Left eval  Hip flexion 39.5 30  Hip extension    Hip abduction 49.7 41.7  Hip adduction    Hip internal rotation    Hip external rotation    Knee flexion    Knee extension 61.9 39.5  Ankle dorsiflexion    Ankle plantarflexion    Ankle inversion    Ankle eversion     (Blank rows = not tested)   GAIT: Lateral movement noted. Decreased hip flexion bilatera.  TODAY'S TREATMENT:                                                                                                                              DATE:  Access Code: [email or web address] URL: [email or web address] Date: 11/29/2022 Prepared by: Carolyne Littles  Exercises - Supine Piriformis Stretch with Foot on Ground  -  2 x daily - 7 x weekly - 2 sets - 3 reps - 20 sec  hold - Supine Lower Trunk Rotation  - 2 x daily - 7 x weekly - 2 sets - 10 reps - Standing Glute Med Mobilization with Small Ball on Wall  - 1 x daily - 7 x weekly - 3 sets - 1 reps - 2-3 min  hold   PATIENT EDUCATION:  Education details: HEP, symptom management  Person educated: Patient Education method: Explanation, Demonstration, Tactile cues, Verbal cues, and Handouts Education comprehension: verbalized understanding, returned  demonstration, verbal cues required, tactile cues required, and needs further education  HOME EXERCISE PROGRAM: Access Code: [email or web address] URL: [email or web address] Date: 11/29/2022 Prepared by: Lorayne Bender  Exercises - Supine Piriformis Stretch with Foot on Ground  - 2 x daily - 7 x weekly - 2 sets - 3 reps - 20 sec  hold - Supine Lower Trunk Rotation  - 2 x daily - 7 x weekly - 2 sets - 10 reps - Standing Glute Med Mobilization with Small Ball on Wall  - 1 x daily - 7 x weekly - 3 sets - 1 reps - 2-3 min  hold  ASSESSMENT:  CLINICAL IMPRESSION: Patient is a 82 year old male who presents to therapy with low back pain that extends into his gluteals.  He has significant spasming in the morning.  He had initial onset of pain following his left hip replacement.  Through his course of PT we were able to resolve his symptoms.  He reports a few months ago the pain returned.  He has mild limitations in lumbar rotation.  He also has mild little limitations in hip internal rotation.  He has spasming in bilateral gluteal muscles.  He also has a strength deficit on the left compared to the right.  He would benefit from skilled therapy to reduce pain in the morning and improve his ability to move after he gets up from bed.  OBJECTIVE IMPAIRMENTS: Abnormal gait, decreased activity tolerance, difficulty walking, decreased ROM, decreased strength, increased muscle spasms, and pain.   ACTIVITY LIMITATIONS: lifting, bending, sitting, standing, squatting, stairs, transfers, and locomotion level  PARTICIPATION LIMITATIONS: meal prep, cleaning, shopping, community activity, and yard work  PERSONAL FACTORS: Age and 1-2 comorbidities: left hip replacement  are also affecting patient's functional outcome.   REHAB POTENTIAL:  Good CLINICAL DECISION MAKING: Evolving/moderate complexity  EVALUATION COMPLEXITY: Low   GOALS: Goals reviewed with patient? Yes  SHORT TERM GOALS: Target date:  12/27/2022    Patient will demonstrate full lumbar rotation without pain Baseline: Goal status: INITIAL  2.  Patient will report a 50% reduction in spasming in the morning Baseline:  Goal status: INITIAL  3.  Patient will increase gross left lower extremity strength by 10 pounds Baseline:  Goal status: INITIAL   LONG TERM GOALS: Target date: 01/24/2023    Patient will report complete resolution of spasming in the morning in order to improve his ability to perform ADLs Baseline:  Goal status: INITIAL  2.  Patient will have a complete exercise program including gym activity without increased pain. Baseline:  Goal status: INITIAL  3.  Left lower extremity strength will be within 5 pounds of right lower extremity strength to improve symmetry with gait Baseline:  Goal status: INITIAL PLAN:  PT FREQUENCY: 2x/week  PT DURATION: 8 weeks  PLANNED INTERVENTIONS: Therapeutic exercises, Therapeutic activity, Neuromuscular re-education, Balance training, Gait training, Patient/Family education, Self Care, Joint mobilization, Joint manipulation, DME instructions, Aquatic Therapy,  Electrical stimulation, Cryotherapy, Moist heat, Taping, Ultrasound, and Manual therapy.  PLAN FOR NEXT SESSION: consider TPDN, Trigger point release to gluteals; review base strengthening exercises.    Carney Living, PT 11/29/2022, 11:33 AM

## 2022-11-29 ENCOUNTER — Encounter (HOSPITAL_BASED_OUTPATIENT_CLINIC_OR_DEPARTMENT_OTHER): Payer: Self-pay | Admitting: Physical Therapy

## 2022-11-29 ENCOUNTER — Ambulatory Visit (HOSPITAL_BASED_OUTPATIENT_CLINIC_OR_DEPARTMENT_OTHER): Payer: Medicare Other | Admitting: Physical Therapy

## 2022-11-29 DIAGNOSIS — M25552 Pain in left hip: Secondary | ICD-10-CM | POA: Diagnosis not present

## 2022-11-29 DIAGNOSIS — R2689 Other abnormalities of gait and mobility: Secondary | ICD-10-CM

## 2022-11-29 DIAGNOSIS — M25652 Stiffness of left hip, not elsewhere classified: Secondary | ICD-10-CM

## 2022-11-29 DIAGNOSIS — Z96642 Presence of left artificial hip joint: Secondary | ICD-10-CM

## 2022-11-29 NOTE — Therapy (Signed)
OUTPATIENT PHYSICAL THERAPY THORACOLUMBAR EVALUATION   Patient Name: Jimmy Palmer MRN: [id number] DOB:1941-09-12, 82 y.o., male Today's Date: 11/29/2022  END OF SESSION:  PT End of Session - 11/29/22 1625     Visit Number 2    Number of Visits 16    Date for PT Re-Evaluation 01/24/23    Authorization Type progress note and Kx at 10    PT Start Time 1430    PT Stop Time 1513    PT Time Calculation (min) 43 min    Activity Tolerance Patient tolerated treatment well    Behavior During Therapy [facility] for tasks assessed/performed              Past Medical History:  Diagnosis Date   Abnormal electrocardiogram    Abnormal heart sounds    Allergy 2020   upon arrival to [facility]   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   Chronic kidney disease 11/07/2021   Depression 2020   receiving appropriate therapy   Herniated lumbar intervertebral disc    Hypertension    Keratosis    benign   Left bundle branch block    Pacemaker    Pure hypercholesterolemia    Sick sinus syndrome (HCC)    Sleep apnea    VT (ventricular tachycardia) ([place])    Past Surgical History:  Procedure Laterality Date   CARDIOVERSION N/A 09/28/2020   Procedure: CARDIOVERSION;  Surgeon: Acie Fredrickson Wonda Cheng, MD;  Location: [place];  Service: Cardiovascular;  Laterality: N/A;   INSERT / REPLACE / REMOVE PACEMAKER     JOINT REPLACEMENT     partial right knee, medial side   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   Procedure: LEFT TOTAL HIP ARTHROPLASTY ANTERIOR APPROACH;  Surgeon: Mcarthur Rossetti, MD;  Location: [facility];  Service: Orthopedics;  Laterality: Left;   VASECTOMY     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   Rectal bleeding 02/01/2022   Left hip pain 02/01/2022   Environmental and seasonal allergies 02/01/2022   Hypertension 11/07/2021   Chronic kidney disease 11/07/2021   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 He used his stretches which helped a bit.   PERTINENT HISTORY:  Arthritis, CKD, Herniated intervertebral discs, depression, Left BBB, THA left, VT  PAIN:  Are you having pain? Yes: NPRS scale: 6/10 in the morning nothing right now   Pain location: Lumbar spine Pain description: aching  Aggravating factors: Mornings  Relieving factors: Heat and voltarin  PRECAUTIONS: None  WEIGHT BEARING RESTRICTIONS: No  FALLS:  Has patient fallen in last 6 months? No  LIVING ENVIRONMENT: Lives with his wife  OCCUPATION: retired form the [employer]  PLOF: Independent  PATIENT GOALS: to have less pain    NEXT MD VISIT:   OBJECTIVE:   DIAGNOSTIC FINDINGS:    PATIENT SURVEYS:  FOTO    SCREENING FOR RED FLAGS: Bowel or bladder incontinence: No Spinal tumors: No Cauda equina syndrome: No Compression fracture: No Abdominal aneurysm: No  COGNITION: Overall cognitive status: Within functional limits for tasks assessed     SENSATION: Denies paresthesias   MUSCLE LENGTH:  POSTURE: No Significant postural limitations  PALPATION: Spasming in the lumbar spine .   LUMBAR ROM:    AROM eval  Flexion Full   Extension Full   Right lateral flexion   Left lateral flexion   Right rotation Limited 25%   Left rotation Limited 25%    (Blank rows = not tested)  LOWER EXTREMITY ROM:     Passive  Right eval Left eval  Hip flexion Limit  No limit  Hip extension    Hip abduction    Hip adduction    Hip internal rotation Painful  Painful   Hip external rotation No limit No limit  Knee flexion    Knee extension    Ankle dorsiflexion    Ankle plantarflexion    Ankle inversion    Ankle eversion     (Blank rows = not tested)  LOWER EXTREMITY MMT:    MMT Right eval Left eval  Hip flexion 39.5 30  Hip extension    Hip abduction 49.7 41.7  Hip adduction    Hip internal rotation    Hip external rotation    Knee flexion    Knee extension 61.9 39.5  Ankle dorsiflexion    Ankle plantarflexion    Ankle inversion    Ankle eversion     (Blank rows = not tested)   GAIT: Lateral movement noted. Decreased hip flexion bilatera.  TODAY'S TREATMENT:                                                                                                                              DATE:   1/3 Trigger Point Dry-Needling  Treatment instructions: Expect mild to moderate muscle soreness. S/S of pneumothorax if dry needled over a lung field, and to seek immediate medical attention should they occur. Patient verbalized understanding of these instructions and education.  Patient Consent Given: Yes Education handout provided:Yes  Muscles  treated: bilateral gluteal 3 spots  Electrical stimulation performed: No Parameters: N/A Treatment response/outcome: great twitch    Manual: trigger point release yto lguteal and education on self soft tissue mobilization   Reviewed current HEP   Bridge x15  Supine March x156  Supine clamshell x15 red    Eval: Access Code: [email or web address] URL: [email or web address] Date: 11/29/2022 Prepared by: Carolyne Littles  Exercises - Supine Piriformis Stretch with Foot on Ground  - 2 x daily - 7 x weekly - 2 sets - 3 reps - 20 sec  hold - Supine Lower Trunk Rotation  - 2 x daily - 7 x weekly - 2 sets - 10 reps - Standing Glute Med Mobilization with Small Ball on Wall  - 1 x daily - 7 x weekly - 3 sets - 1 reps - 2-3 min  hold   PATIENT EDUCATION:  Education details: HEP, symptom management  Person educated: Patient Education method: Explanation, Demonstration, Tactile cues, Verbal cues, and Handouts Education comprehension: verbalized understanding, returned demonstration, verbal cues required, tactile cues required, and needs further education  HOME EXERCISE PROGRAM: Access Code: [email or web address] URL: [email or web address] Date: 11/29/2022 Prepared by: Carolyne Littles  Exercises - Supine Piriformis Stretch with Foot on Ground  - 2 x daily - 7 x weekly - 2 sets - 3 reps - 20 sec  hold - Supine Lower Trunk Rotation  - 2 x daily - 7 x weekly - 2 sets - 10 reps - Standing Glute Med Mobilization with Small Ball on Wall  - 1 x daily - 7 x weekly - 3 sets - 1 reps - 2-3 min  hold  ASSESSMENT:  CLINICAL IMPRESSION: The patient had a great twitch with needling today. He had a twitch response on each side. We reviewed post needle soreness and how to improve it. We will assess his tolerance to needling next visit. We will progress exercises as tolerated.  OBJECTIVE IMPAIRMENTS: Abnormal gait, decreased activity tolerance, difficulty walking, decreased ROM, decreased strength, increased muscle spasms, and pain.   ACTIVITY LIMITATIONS: lifting, bending, sitting, standing, squatting, stairs, transfers, and locomotion level  PARTICIPATION LIMITATIONS: meal prep, cleaning, shopping, community activity, and yard work  PERSONAL FACTORS: Age and 1-2 comorbidities: left hip replacement  are also affecting patient's functional outcome.   REHAB POTENTIAL:  Good CLINICAL DECISION MAKING:  Evolving/moderate complexity  EVALUATION COMPLEXITY: Low   GOALS: Goals reviewed with patient? Yes  SHORT TERM GOALS: Target date: 12/27/2022    Patient will demonstrate full lumbar rotation without pain Baseline: Goal status: INITIAL  2.  Patient will report a 50% reduction in spasming in the morning Baseline:  Goal status: INITIAL  3.  Patient will increase gross left lower extremity strength by 10 pounds Baseline:  Goal status: INITIAL   LONG TERM GOALS: Target date: 01/24/2023    Patient will report complete resolution of spasming in the morning in order to improve his ability to perform ADLs Baseline:  Goal status: INITIAL  2.  Patient will have a complete exercise program including gym activity without increased pain. Baseline:  Goal status: INITIAL  3.  Left lower extremity strength will be within 5 pounds of right lower extremity strength to improve symmetry with gait Baseline:  Goal status: INITIAL PLAN:  PT FREQUENCY: 2x/week  PT DURATION: 8 weeks  PLANNED INTERVENTIONS: Therapeutic exercises, Therapeutic activity, Neuromuscular re-education, Balance training, Gait training, Patient/Family education, Self Care, Joint mobilization, Joint manipulation, DME instructions, Aquatic Therapy, Electrical stimulation, Cryotherapy, Moist heat, Taping, Ultrasound, and  Manual therapy.  PLAN FOR NEXT SESSION: consider TPDN, Trigger point release to gluteals; review base strengthening exercises.    Dessie Coma, PT 11/29/2022, 4:31 PM

## 2022-12-04 ENCOUNTER — Ambulatory Visit (HOSPITAL_BASED_OUTPATIENT_CLINIC_OR_DEPARTMENT_OTHER): Payer: Medicare Other | Admitting: Physical Therapy

## 2022-12-04 ENCOUNTER — Encounter (HOSPITAL_BASED_OUTPATIENT_CLINIC_OR_DEPARTMENT_OTHER): Payer: Self-pay

## 2022-12-04 ENCOUNTER — Ambulatory Visit: Payer: Medicare Other | Admitting: Physician Assistant

## 2022-12-06 ENCOUNTER — Encounter (HOSPITAL_BASED_OUTPATIENT_CLINIC_OR_DEPARTMENT_OTHER): Payer: Medicare Other | Admitting: Physical Therapy

## 2022-12-06 ENCOUNTER — Encounter (HOSPITAL_BASED_OUTPATIENT_CLINIC_OR_DEPARTMENT_OTHER): Payer: Self-pay | Admitting: Physical Therapy

## 2022-12-06 ENCOUNTER — Ambulatory Visit (HOSPITAL_BASED_OUTPATIENT_CLINIC_OR_DEPARTMENT_OTHER): Payer: Medicare Other | Admitting: Physical Therapy

## 2022-12-06 ENCOUNTER — Telehealth: Payer: Self-pay | Admitting: Orthopaedic Surgery

## 2022-12-06 DIAGNOSIS — M25552 Pain in left hip: Secondary | ICD-10-CM | POA: Diagnosis not present

## 2022-12-06 DIAGNOSIS — Z96642 Presence of left artificial hip joint: Secondary | ICD-10-CM

## 2022-12-06 DIAGNOSIS — R2689 Other abnormalities of gait and mobility: Secondary | ICD-10-CM

## 2022-12-06 DIAGNOSIS — M25652 Stiffness of left hip, not elsewhere classified: Secondary | ICD-10-CM

## 2022-12-06 NOTE — Telephone Encounter (Signed)
Patient would like steroird shots in his back and wanted to know if he could get a reffere to Dr Ernestina Patches or does Dr Sammuel Hines do those.  Please call pt to advise [phone]  [email or web address]

## 2022-12-06 NOTE — Therapy (Signed)
OUTPATIENT PHYSICAL THERAPY THORACOLUMBAR EVALUATION   Patient Name: Jimmy Palmer MRN: [id number] DOB:1941-09-12, 82 y.o., male Today's Date: 11/29/2022  END OF SESSION:  PT End of Session - 11/29/22 1625     Visit Number 2    Number of Visits 16    Date for PT Re-Evaluation 01/24/23    Authorization Type progress note and Kx at 10    PT Start Time 1430    PT Stop Time 1513    PT Time Calculation (min) 43 min    Activity Tolerance Patient tolerated treatment well    Behavior During Therapy [facility] for tasks assessed/performed              Past Medical History:  Diagnosis Date   Abnormal electrocardiogram    Abnormal heart sounds    Allergy 2020   upon arrival to [facility]   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   Chronic kidney disease 11/07/2021   Depression 2020   receiving appropriate therapy   Herniated lumbar intervertebral disc    Hypertension    Keratosis    benign   Left bundle branch block    Pacemaker    Pure hypercholesterolemia    Sick sinus syndrome (HCC)    Sleep apnea    VT (ventricular tachycardia) ([place])    Past Surgical History:  Procedure Laterality Date   CARDIOVERSION N/A 09/28/2020   Procedure: CARDIOVERSION;  Surgeon: Acie Fredrickson Wonda Cheng, MD;  Location: [place];  Service: Cardiovascular;  Laterality: N/A;   INSERT / REPLACE / REMOVE PACEMAKER     JOINT REPLACEMENT     partial right knee, medial side   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   Procedure: LEFT TOTAL HIP ARTHROPLASTY ANTERIOR APPROACH;  Surgeon: Mcarthur Rossetti, MD;  Location: [facility];  Service: Orthopedics;  Laterality: Left;   VASECTOMY     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   Rectal bleeding 02/01/2022   Left hip pain 02/01/2022   Environmental and seasonal allergies 02/01/2022   Hypertension 11/07/2021   Chronic kidney disease 11/07/2021   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 He used his stretches which helped a bit.   PERTINENT HISTORY:  Arthritis, CKD, Herniated intervertebral discs, depression, Left BBB, THA left, VT  PAIN:  Are you having pain? Yes: NPRS scale: 6/10 in the morning nothing right now   Pain location: Lumbar spine Pain description: aching  Aggravating factors: Mornings  Relieving factors: Heat and voltarin  PRECAUTIONS: None  WEIGHT BEARING RESTRICTIONS: No  FALLS:  Has patient fallen in last 6 months? No  LIVING ENVIRONMENT: Lives with his wife  OCCUPATION: retired form the [employer]  PLOF: Independent  PATIENT GOALS: to have less pain    NEXT MD VISIT:   OBJECTIVE:   DIAGNOSTIC FINDINGS:    PATIENT SURVEYS:  FOTO    SCREENING FOR RED FLAGS: Bowel or bladder incontinence: No Spinal tumors: No Cauda equina syndrome: No Compression fracture: No Abdominal aneurysm: No  COGNITION: Overall cognitive status: Within functional limits for tasks assessed     SENSATION: Denies paresthesias   MUSCLE LENGTH:  POSTURE: No Significant postural limitations  PALPATION: Spasming in the lumbar spine .   LUMBAR ROM:    AROM eval  Flexion Full   Extension Full   Right lateral flexion   Left lateral flexion   Right rotation Limited 25%   Left rotation Limited 25%    (Blank rows = not tested)  LOWER EXTREMITY ROM:     Passive  Right eval Left eval  Hip flexion Limit  No limit  Hip extension    Hip abduction    Hip adduction    Hip internal rotation Painful  Painful   Hip external rotation No limit No limit  Knee flexion    Knee extension    Ankle dorsiflexion    Ankle plantarflexion    Ankle inversion    Ankle eversion     (Blank rows = not tested)  LOWER EXTREMITY MMT:    MMT Right eval Left eval  Hip flexion 39.5 30  Hip extension    Hip abduction 49.7 41.7  Hip adduction    Hip internal rotation    Hip external rotation    Knee flexion    Knee extension 61.9 39.5  Ankle dorsiflexion    Ankle plantarflexion    Ankle inversion    Ankle eversion     (Blank rows = not tested)   GAIT: Lateral movement noted. Decreased hip flexion bilatera.  TODAY'S TREATMENT:                                                                                                                              DATE:  1/10 Trigger Point Dry-Needling  Treatment instructions: Expect mild to moderate muscle soreness. S/S of pneumothorax if dry needled over a lung field, and to seek immediate medical attention should they occur. Patient verbalized understanding of these instructions and education.  Patient Consent Given: Yes Education handout provided:Yes  Muscles treated:  bilateral gluteal 3 spots  Electrical stimulation performed: No Parameters: N/A Treatment response/outcome: great twitch    Manual: trigger point release yto lguteal and education on self soft tissue mobilization   Reviewed current HEP   Bridge x15  Supine March x156  Supine clamshell x15 red    1/3 Trigger Point Dry-Needling  Treatment instructions: Expect mild to moderate muscle soreness. S/S of pneumothorax if dry  needled over a lung field, and to seek immediate medical attention should they occur. Patient verbalized understanding of these instructions and education.  Patient Consent Given: Yes Education handout provided:Yes  Muscles treated: bilateral gluteal 3 spots  Electrical stimulation performed: No Parameters: N/A Treatment response/outcome: great twitch    Manual: trigger point release yto lguteal and education on self soft tissue mobilization   Reviewed current HEP   Bridge x15  Supine March x156  Supine clamshell x15 red    Eval: Access Code: [email or web address] URL: [email or web address] Date: 11/29/2022 Prepared by: Carolyne Littles  Exercises - Supine Piriformis Stretch with Foot on Ground  - 2 x daily - 7 x weekly - 2 sets - 3 reps - 20 sec  hold - Supine Lower Trunk Rotation  - 2 x daily - 7 x weekly - 2 sets - 10 reps - Standing Glute Med Mobilization with Small Ball on Wall  - 1 x daily - 7 x weekly - 3 sets - 1 reps - 2-3 min  hold   PATIENT EDUCATION:  Education details: HEP, symptom management  Person educated: Patient Education method: Explanation, Demonstration, Tactile cues, Verbal cues, and Handouts Education comprehension: verbalized understanding, returned demonstration, verbal cues required, tactile cues required, and needs further education  HOME EXERCISE PROGRAM: Access Code: [email or web address] URL: [email or web address] Date: 11/29/2022 Prepared by: Carolyne Littles  Exercises - Supine Piriformis Stretch with Foot on Ground  - 2 x daily - 7 x weekly - 2 sets - 3 reps - 20 sec  hold - Supine Lower Trunk Rotation  - 2 x daily - 7 x weekly - 2 sets - 10 reps - Standing Glute Med Mobilization with Small Ball on Wall  - 1 x daily - 7 x weekly - 3 sets - 1 reps - 2-3 min  hold  ASSESSMENT:  CLINICAL IMPRESSION: The patient had a great twitch with needling today. He had a twitch response on each side. We reviewed post needle soreness and how to improve  it. We will assess his tolerance to needling next visit. We will progress exercises as tolerated.  OBJECTIVE IMPAIRMENTS: Abnormal gait, decreased activity tolerance, difficulty walking, decreased ROM, decreased strength, increased muscle spasms, and pain.   ACTIVITY LIMITATIONS: lifting, bending, sitting, standing, squatting, stairs, transfers, and locomotion level  PARTICIPATION LIMITATIONS: meal prep, cleaning, shopping, community activity, and yard work  PERSONAL FACTORS: Age and 1-2 comorbidities: left hip replacement  are also affecting patient's functional outcome.   REHAB POTENTIAL:  Good CLINICAL DECISION MAKING: Evolving/moderate complexity  EVALUATION COMPLEXITY: Low   GOALS: Goals reviewed with patient? Yes  SHORT TERM GOALS: Target date: 12/27/2022    Patient will demonstrate full lumbar rotation without pain Baseline: Goal status: INITIAL  2.  Patient will report a 50% reduction in spasming in the morning Baseline:  Goal status: INITIAL  3.  Patient will increase gross left lower extremity strength by 10 pounds Baseline:  Goal status: INITIAL   LONG TERM GOALS: Target date: 01/24/2023    Patient will report complete resolution of spasming in the morning in order to  improve his ability to perform ADLs Baseline:  Goal status: INITIAL  2.  Patient will have a complete exercise program including gym activity without increased pain. Baseline:  Goal status: INITIAL  3.  Left lower extremity strength will be within 5 pounds of right lower extremity strength to improve symmetry with gait Baseline:  Goal status: INITIAL PLAN:  PT FREQUENCY: 2x/week  PT DURATION: 8 weeks  PLANNED INTERVENTIONS: Therapeutic exercises, Therapeutic activity, Neuromuscular re-education, Balance training, Gait training, Patient/Family education, Self Care, Joint mobilization, Joint manipulation, DME instructions, Aquatic Therapy, Electrical stimulation, Cryotherapy, Moist heat,  Taping, Ultrasound, and Manual therapy.  PLAN FOR NEXT SESSION: consider TPDN, Trigger point release to gluteals; review base strengthening exercises.    Carney Living, PT 11/29/2022, 4:31 PM

## 2022-12-07 ENCOUNTER — Telehealth: Payer: Self-pay | Admitting: Internal Medicine

## 2022-12-07 ENCOUNTER — Other Ambulatory Visit: Payer: Self-pay | Admitting: Physical Medicine and Rehabilitation

## 2022-12-07 ENCOUNTER — Encounter (HOSPITAL_BASED_OUTPATIENT_CLINIC_OR_DEPARTMENT_OTHER): Payer: Self-pay | Admitting: Physical Therapy

## 2022-12-07 ENCOUNTER — Telehealth: Payer: Self-pay | Admitting: Physical Medicine and Rehabilitation

## 2022-12-07 ENCOUNTER — Other Ambulatory Visit (HOSPITAL_BASED_OUTPATIENT_CLINIC_OR_DEPARTMENT_OTHER): Payer: Self-pay | Admitting: Orthopaedic Surgery

## 2022-12-07 DIAGNOSIS — M5416 Radiculopathy, lumbar region: Secondary | ICD-10-CM

## 2022-12-07 DIAGNOSIS — M4316 Spondylolisthesis, lumbar region: Secondary | ICD-10-CM

## 2022-12-07 NOTE — Telephone Encounter (Signed)
Patient called to talk with Dr. Caryl Comes or nurse in regards to an MRI the patient is having. Wants to know if his pacemaker is compatible with having an MRI

## 2022-12-07 NOTE — Telephone Encounter (Signed)
Patient has pacemaker, and is unable to have an MRI, he can have an CT scan, he wants to have an appointment please call..[phone]

## 2022-12-07 NOTE — Telephone Encounter (Signed)
Referral placed in chart to Dr. Ernestina Patches

## 2022-12-07 NOTE — Telephone Encounter (Signed)
Returned call to Pt.  Advised his MDT [place] ADDRL1 is NOT MRI compatible.  Pt thanked nurse for call back.

## 2022-12-08 ENCOUNTER — Other Ambulatory Visit (HOSPITAL_BASED_OUTPATIENT_CLINIC_OR_DEPARTMENT_OTHER): Payer: Self-pay | Admitting: Orthopaedic Surgery

## 2022-12-08 ENCOUNTER — Other Ambulatory Visit (HOSPITAL_BASED_OUTPATIENT_CLINIC_OR_DEPARTMENT_OTHER): Payer: Self-pay

## 2022-12-08 MED ORDER — METHOCARBAMOL 500 MG PO TABS
500.0000 mg | ORAL_TABLET | Freq: Four times a day (QID) | ORAL | 3 refills | Status: DC
  Filled 2022-12-29: qty 30, 8d supply, fill #0
  Filled 2023-01-13 (×2): qty 30, 8d supply, fill #1
  Filled 2023-01-24: qty 30, 8d supply, fill #2

## 2022-12-11 ENCOUNTER — Encounter (HOSPITAL_BASED_OUTPATIENT_CLINIC_OR_DEPARTMENT_OTHER): Payer: Medicare Other | Admitting: Physical Therapy

## 2022-12-13 ENCOUNTER — Encounter (HOSPITAL_BASED_OUTPATIENT_CLINIC_OR_DEPARTMENT_OTHER): Payer: Self-pay | Admitting: Physical Therapy

## 2022-12-13 ENCOUNTER — Ambulatory Visit (HOSPITAL_BASED_OUTPATIENT_CLINIC_OR_DEPARTMENT_OTHER): Payer: Medicare Other | Admitting: Physical Therapy

## 2022-12-13 DIAGNOSIS — M25552 Pain in left hip: Secondary | ICD-10-CM | POA: Diagnosis not present

## 2022-12-13 DIAGNOSIS — R2689 Other abnormalities of gait and mobility: Secondary | ICD-10-CM

## 2022-12-13 NOTE — Therapy (Signed)
OUTPATIENT PHYSICAL THERAPY THORACOLUMBAR EVALUATION   Patient Name: Jimmy Palmer MRN: [id number] DOB:1941/09/07, 82 y.o., male Today's Date: 12/13/2022  END OF SESSION:  PT End of Session - 12/13/22 1311     Visit Number 4    Number of Visits 16    Date for PT Re-Evaluation 01/24/23    Authorization Type progress note and Kx at 10              Past Medical History:  Diagnosis Date   Abnormal electrocardiogram    Abnormal heart sounds    Allergy 2020   upon arrival to [facility]   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   Chronic kidney disease 11/07/2021   Depression 2020   receiving appropriate therapy   Herniated lumbar intervertebral disc    Hypertension    Keratosis    benign   Left bundle branch block    Pacemaker    Pure hypercholesterolemia    Sick sinus syndrome (HCC)    Sleep apnea    VT (ventricular tachycardia) (HCC)    Past Surgical History:  Procedure Laterality Date   CARDIOVERSION N/A 09/28/2020   Procedure: CARDIOVERSION;  Surgeon: Elease Hashimoto Deloris Ping, MD;  Location: [facility];  Service: Cardiovascular;  Laterality: N/A;   INSERT / REPLACE / REMOVE PACEMAKER     JOINT REPLACEMENT     partial right knee, medial side   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   Procedure: LEFT TOTAL HIP ARTHROPLASTY ANTERIOR APPROACH;  Surgeon: Kathryne Hitch, MD;  Location: [facility];  Service: Orthopedics;  Laterality: Left;   VASECTOMY     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  ONSET DATE:   SUBJECTIVE:  SUBJECTIVE STATEMENT: The patient continues to have significant pain.  PERTINENT HISTORY:  Arthritis, CKD, Herniated intervertebral discs, depression, Left BBB, THA left, VT  PAIN:  Are you having pain? Yes: NPRS scale: 6-8/10 at this time  Pain location: Lumbar spine Pain description: aching  Aggravating factors: Mornings  Relieving factors: Heat and voltarin  PRECAUTIONS: None  WEIGHT BEARING RESTRICTIONS: No  FALLS:  Has patient fallen in last 6 months? No  LIVING ENVIRONMENT: Lives with his wife  OCCUPATION: retired form the [employer]  PLOF: Independent  PATIENT GOALS: to have less pain    NEXT MD VISIT:   OBJECTIVE:   DIAGNOSTIC FINDINGS:    PATIENT SURVEYS:  FOTO    SCREENING FOR RED FLAGS: Bowel or bladder incontinence: No Spinal tumors: No Cauda equina syndrome: No Compression fracture: No Abdominal aneurysm: No  COGNITION: Overall cognitive status: Within functional limits for tasks assessed     SENSATION: Denies paresthesias   MUSCLE LENGTH:  POSTURE: No Significant postural limitations  PALPATION: Spasming in the lumbar spine .   LUMBAR ROM:   AROM eval  Flexion Full   Extension Full   Right lateral flexion   Left lateral flexion   Right rotation Limited 25%   Left rotation Limited 25%    (Blank rows = not tested)  LOWER EXTREMITY ROM:     Passive  Right eval Left eval  Hip flexion Limit  No limit  Hip extension    Hip abduction    Hip adduction    Hip internal rotation Painful  Painful   Hip external rotation No limit  No limit  Knee flexion    Knee extension    Ankle dorsiflexion    Ankle plantarflexion    Ankle inversion    Ankle eversion     (Blank rows = not tested)  LOWER EXTREMITY MMT:    MMT Right eval Left eval  Hip flexion 39.5 30  Hip extension    Hip abduction 49.7 41.7  Hip adduction    Hip internal rotation    Hip external rotation    Knee flexion    Knee extension 61.9 39.5  Ankle dorsiflexion    Ankle plantarflexion    Ankle inversion    Ankle eversion     (Blank rows = not tested)   GAIT: Lateral movement noted. Decreased hip flexion bilatera.  TODAY'S TREATMENT:                                                                                                                              DATE:  1/17 Trigger Point Dry-Needling  Treatment instructions: Expect mild to moderate muscle soreness. S/S of pneumothorax if dry needled over a lung field, and to seek immediate medical attention should they occur. Patient verbalized understanding of these instructions and education.  Patient Consent Given: Yes Education handout provided:Yes  Muscles treated: bilateral gluteal 3 spots  Electrical stimulation performed: No Parameters: N/A Treatment response/outcome: great  twitch    Manual: trigger point release yto lguteal and education on self soft tissue mobilization    1/10 Trigger Point Dry-Needling  Treatment instructions: Expect mild to moderate muscle soreness. S/S of pneumothorax if dry needled over a lung field, and to seek immediate medical attention should they occur. Patient verbalized understanding of these instructions and education.  Patient Consent Given: Yes Education handout provided:Yes  Muscles treated: bilateral gluteal 3 spots  Electrical stimulation performed: No Parameters: N/A Treatment response/outcome: great twitch    Manual: trigger point release yto lguteal and education on self soft tissue mobilization   Reviewed current HEP   Bridge x15   Supine March x15 Supine clamshell x15 red    1/3 Trigger Point Dry-Needling  Treatment instructions: Expect mild to moderate muscle soreness. S/S of pneumothorax if dry needled over a lung field, and to seek immediate medical attention should they occur. Patient verbalized understanding of these instructions and education.  Patient Consent Given: Yes Education handout provided:Yes  Muscles treated: bilateral gluteal 3 spots  Electrical stimulation performed: No Parameters: N/A Treatment response/outcome: great twitch    Manual: trigger point release yto lguteal and education on self soft tissue mobilization   Reviewed current HEP   Bridge x15  Supine March x156  Supine clamshell x15 red      PATIENT EDUCATION:  Education details: HEP, symptom management  Person educated: Patient Education method: Explanation, Demonstration, Tactile cues, Verbal cues, and Handouts Education comprehension: verbalized understanding, returned demonstration, verbal cues required, tactile cues required, and needs further education  HOME EXERCISE PROGRAM: Access Code: [email or web address] URL: [email or web address] Date: 11/29/2022 Prepared by: Carolyne Littles  Exercises - Supine Piriformis Stretch with Foot on Ground  - 2 x daily - 7 x weekly - 2 sets - 3 reps - 20 sec  hold - Supine Lower Trunk Rotation  - 2 x daily - 7 x weekly - 2 sets - 10 reps - Standing Glute Med Mobilization with Small Ball on Wall  - 1 x daily - 7 x weekly - 3 sets - 1 reps - 2-3 min  hold  ASSESSMENT:  CLINICAL IMPRESSION: Therapy needled his lumbar paraspinals today. He had a referral pattern that followed his pain pattern. We also performed manual trigger point release to his lower lumbar spine. He performed light there-ex without much pain. We will continue to progress as tolerated.    OBJECTIVE IMPAIRMENTS: Abnormal gait, decreased activity tolerance, difficulty walking, decreased ROM, decreased strength,  increased muscle spasms, and pain.   ACTIVITY LIMITATIONS: lifting, bending, sitting, standing, squatting, stairs, transfers, and locomotion level  PARTICIPATION LIMITATIONS: meal prep, cleaning, shopping, community activity, and yard work  PERSONAL FACTORS: Age and 1-2 comorbidities: left hip replacement  are also affecting patient's functional outcome.   REHAB POTENTIAL:  Good CLINICAL DECISION MAKING: Evolving/moderate complexity  EVALUATION COMPLEXITY: Low   GOALS: Goals reviewed with patient? Yes  SHORT TERM GOALS: Target date: 12/27/2022    Patient will demonstrate full lumbar rotation without pain Baseline: Goal status: INITIAL  2.  Patient will report a 50% reduction in spasming in the morning Baseline:  Goal status: INITIAL  3.  Patient will increase gross left lower extremity strength by 10 pounds Baseline:  Goal status: INITIAL   LONG TERM GOALS: Target date: 01/24/2023    Patient will report complete resolution of spasming in the morning in order to improve his ability to perform ADLs Baseline:  Goal status: INITIAL  2.  Patient will have a complete exercise  program including gym activity without increased pain. Baseline:  Goal status: INITIAL  3.  Left lower extremity strength will be within 5 pounds of right lower extremity strength to improve symmetry with gait Baseline:  Goal status: INITIAL PLAN:  PT FREQUENCY: 2x/week  PT DURATION: 8 weeks  PLANNED INTERVENTIONS: Therapeutic exercises, Therapeutic activity, Neuromuscular re-education, Balance training, Gait training, Patient/Family education, Self Care, Joint mobilization, Joint manipulation, DME instructions, Aquatic Therapy, Electrical stimulation, Cryotherapy, Moist heat, Taping, Ultrasound, and Manual therapy.  PLAN FOR NEXT SESSION: consider TPDN, Trigger point release to gluteals; review base strengthening exercises.    Dessie Coma, PT 12/13/2022, 1:14 PM

## 2022-12-14 ENCOUNTER — Encounter: Payer: Medicare Other | Admitting: Internal Medicine

## 2022-12-14 ENCOUNTER — Ambulatory Visit: Payer: Medicare Other | Admitting: Orthopedic Surgery

## 2022-12-18 ENCOUNTER — Encounter (HOSPITAL_BASED_OUTPATIENT_CLINIC_OR_DEPARTMENT_OTHER): Payer: Self-pay | Admitting: Physical Therapy

## 2022-12-18 ENCOUNTER — Ambulatory Visit (HOSPITAL_BASED_OUTPATIENT_CLINIC_OR_DEPARTMENT_OTHER): Payer: Medicare Other | Admitting: Physical Therapy

## 2022-12-18 DIAGNOSIS — M25552 Pain in left hip: Secondary | ICD-10-CM | POA: Diagnosis not present

## 2022-12-18 DIAGNOSIS — Z96642 Presence of left artificial hip joint: Secondary | ICD-10-CM

## 2022-12-18 DIAGNOSIS — M25652 Stiffness of left hip, not elsewhere classified: Secondary | ICD-10-CM

## 2022-12-18 DIAGNOSIS — R2689 Other abnormalities of gait and mobility: Secondary | ICD-10-CM

## 2022-12-18 NOTE — Therapy (Signed)
OUTPATIENT PHYSICAL THERAPY THORACOLUMBAR EVALUATION   Patient Name: Jimmy Palmer MRN: [id number] DOB:January 24, 1941, 82 y.o., male Today's Date: 12/18/2022  END OF SESSION:  PT End of Session - 12/18/22 1318     Visit Number 5    Number of Visits 16    Date for PT Re-Evaluation 01/24/23    Authorization Type progress note and Kx at 10    PT Start Time 1315    PT Stop Time 1356    PT Time Calculation (min) 41 min    Activity Tolerance Patient tolerated treatment well    Behavior During Therapy [facility] for tasks assessed/performed              Past Medical History:  Diagnosis Date   Abnormal electrocardiogram    Abnormal heart sounds    Allergy 2020   upon arrival to [facility]   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   Chronic kidney disease 11/07/2021   Depression 2020   receiving appropriate therapy   Herniated lumbar intervertebral disc    Hypertension    Keratosis    benign   Left bundle branch block    Pacemaker    Pure hypercholesterolemia    Sick sinus syndrome (HCC)    Sleep apnea    VT (ventricular tachycardia) (HCC)    Past Surgical History:  Procedure Laterality Date   CARDIOVERSION N/A 09/28/2020   Procedure: CARDIOVERSION;  Surgeon: Elease Hashimoto Deloris Ping, MD;  Location: [facility];  Service: Cardiovascular;  Laterality: N/A;   INSERT / REPLACE / REMOVE PACEMAKER     JOINT REPLACEMENT     partial right knee, medial side   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   Procedure: LEFT TOTAL HIP ARTHROPLASTY ANTERIOR APPROACH;  Surgeon: Kathryne Hitch, MD;  Location: [facility];  Service: Orthopedics;  Laterality: Left;   VASECTOMY     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   Rectal bleeding 02/01/2022   Left hip pain 02/01/2022   Environmental and seasonal allergies 02/01/2022   Hypertension 11/07/2021   Chronic kidney disease 11/07/2021   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 He didn't have as bad of spasms this morning but he still feels it. He has started taking hemp gummies and glucosamine which he feels like has helped.  PERTINENT HISTORY:  Arthritis, CKD, Herniated intervertebral discs, depression, Left BBB, THA left, VT  PAIN:  Are you having pain? Yes: NPRS scale: 6-8/10 at this time  Pain location: Lumbar spine Pain description: aching  Aggravating factors: Mornings  Relieving factors: Heat and voltarin  PRECAUTIONS: None  WEIGHT BEARING RESTRICTIONS: No  FALLS:  Has patient fallen in last 6 months? No  LIVING ENVIRONMENT: Lives with his wife  OCCUPATION: retired form the [employer]  PLOF: Independent  PATIENT GOALS: to have less pain    NEXT MD VISIT:   OBJECTIVE:   DIAGNOSTIC FINDINGS:    PATIENT SURVEYS:  FOTO    SCREENING FOR RED FLAGS: Bowel or bladder incontinence: No Spinal tumors: No Cauda equina syndrome: No Compression fracture: No Abdominal aneurysm: No  COGNITION: Overall cognitive status: Within functional limits for tasks assessed     SENSATION: Denies paresthesias   MUSCLE LENGTH:  POSTURE: No  Significant postural limitations  PALPATION: Spasming in the lumbar spine .   LUMBAR ROM:   AROM eval  Flexion Full   Extension Full   Right lateral flexion   Left lateral flexion   Right rotation Limited 25%   Left rotation Limited 25%    (Blank rows = not tested)  LOWER EXTREMITY ROM:     Passive  Right eval Left eval  Hip flexion Limit  No limit  Hip extension    Hip abduction    Hip adduction    Hip internal rotation Painful  Painful   Hip external rotation No limit No limit  Knee flexion    Knee extension    Ankle dorsiflexion    Ankle plantarflexion    Ankle inversion    Ankle eversion     (Blank rows = not tested)  LOWER EXTREMITY MMT:    MMT Right eval Left eval  Hip flexion 39.5 30  Hip extension    Hip abduction 49.7 41.7  Hip adduction    Hip internal rotation    Hip external rotation    Knee flexion    Knee extension 61.9 39.5  Ankle dorsiflexion    Ankle plantarflexion    Ankle inversion    Ankle eversion     (Blank rows = not tested)   GAIT: Lateral movement noted. Decreased hip flexion bilatera.  TODAY'S TREATMENT:                                                                                                                              DATE:  1/22 Trigger Point Dry-Needling  Treatment instructions: Expect mild to moderate muscle soreness. S/S of pneumothorax if dry needled over a lung field, and to seek immediate medical attention should they occur. Patient verbalized  understanding of these instructions and education.  Patient Consent Given: Yes Education handout provided:Yes  Muscles treated: bilateral gluteal 3 spots  Electrical stimulation performed: No Parameters: N/A Treatment response/outcome: great twitch    Manual: trigger point release to gluteal lower lumbar spine and education on self soft tissue mobilization   Gym exercises:  Cable pull down 2x10 10 lbs  Cable row 2x10 10 lbs   Hip abdcution machine 2x15 55 lbs      1/17 Trigger Point Dry-Needling  Treatment instructions: Expect mild to moderate muscle soreness. S/S of pneumothorax if dry needled over a lung field, and to seek immediate medical attention should they occur. Patient verbalized understanding of these instructions and education.  Patient Consent Given: Yes Education handout provided:Yes  Muscles treated: bilateral gluteal 3 spots  Electrical stimulation performed: No Parameters: N/A Treatment response/outcome: great twitch    Manual: trigger point release yto lguteal and education on self soft tissue mobilization       PATIENT EDUCATION:  Education details: HEP, symptom management  Person educated: Patient Education method: Explanation, Demonstration, Tactile cues, Verbal cues, and Handouts Education comprehension: verbalized understanding, returned demonstration, verbal cues required, tactile cues required, and needs further education  HOME EXERCISE PROGRAM: Access Code: [email or web address] URL: [email or web address] Date: 11/29/2022 Prepared by: Carolyne Littles  Exercises - Supine Piriformis Stretch with Foot on Ground  - 2 x daily - 7 x weekly - 2 sets - 3 reps - 20 sec  hold - Supine Lower Trunk Rotation  - 2 x daily - 7 x weekly - 2 sets - 10 reps - Standing Glute Med Mobilization with Small Ball on Wall  - 1 x daily - 7 x weekly - 3 sets - 1 reps - 2-3 min  hold  ASSESSMENT:  CLINICAL IMPRESSION: The patient had a great twitch in his paraspinals again today. He reported some soreness but overall it has progressed. We got him back into some gym exercises today. He tolerated well. He had no significant pain. We will continue to advance him back to a gym program as tolerated.   OBJECTIVE IMPAIRMENTS: Abnormal gait, decreased activity tolerance, difficulty walking, decreased ROM, decreased strength, increased muscle spasms, and pain.   ACTIVITY LIMITATIONS: lifting, bending, sitting, standing, squatting, stairs,  transfers, and locomotion level  PARTICIPATION LIMITATIONS: meal prep, cleaning, shopping, community activity, and yard work  PERSONAL FACTORS: Age and 1-2 comorbidities: left hip replacement  are also affecting patient's functional outcome.   REHAB POTENTIAL:  Good CLINICAL DECISION MAKING: Evolving/moderate complexity  EVALUATION COMPLEXITY: Low   GOALS: Goals reviewed with patient? Yes  SHORT TERM GOALS: Target date: 12/27/2022    Patient will demonstrate full lumbar rotation without pain Baseline: Goal status: INITIAL  2.  Patient will report a 50% reduction in spasming in the morning Baseline:  Goal status: INITIAL  3.  Patient will increase gross left lower extremity strength by 10 pounds Baseline:  Goal status: INITIAL   LONG TERM GOALS: Target date: 01/24/2023    Patient will report complete resolution of spasming in the morning in order to improve his ability to perform ADLs Baseline:  Goal status: INITIAL  2.  Patient will have a complete exercise program including gym activity without increased pain. Baseline:  Goal status: INITIAL  3.  Left lower extremity strength will be within 5 pounds of right lower extremity strength to improve symmetry with gait Baseline:  Goal status: INITIAL PLAN:  PT FREQUENCY: 2x/week  PT DURATION: 8 weeks  PLANNED INTERVENTIONS:  Therapeutic exercises, Therapeutic activity, Neuromuscular re-education, Balance training, Gait training, Patient/Family education, Self Care, Joint mobilization, Joint manipulation, DME instructions, Aquatic Therapy, Electrical stimulation, Cryotherapy, Moist heat, Taping, Ultrasound, and Manual therapy.  PLAN FOR NEXT SESSION: consider TPDN, Trigger point release to gluteals; review base strengthening exercises.    Carney Living, PT 12/18/2022, 1:40 PM

## 2022-12-21 ENCOUNTER — Encounter (HOSPITAL_BASED_OUTPATIENT_CLINIC_OR_DEPARTMENT_OTHER): Payer: Self-pay | Admitting: Physical Therapy

## 2022-12-21 ENCOUNTER — Ambulatory Visit (HOSPITAL_BASED_OUTPATIENT_CLINIC_OR_DEPARTMENT_OTHER): Payer: Medicare Other | Admitting: Physical Therapy

## 2022-12-21 DIAGNOSIS — M25552 Pain in left hip: Secondary | ICD-10-CM

## 2022-12-21 DIAGNOSIS — R2689 Other abnormalities of gait and mobility: Secondary | ICD-10-CM

## 2022-12-21 NOTE — Therapy (Signed)
OUTPATIENT PHYSICAL THERAPY THORACOLUMBAR EVALUATION   Patient Name: Jimmy Palmer MRN: [id number] DOB:1941-11-17, 82 y.o., male Today's Date: 12/22/2022  END OF SESSION:  PT End of Session - 12/21/22 1348     Visit Number 6    Number of Visits 16    Date for PT Re-Evaluation 01/24/23    Authorization Type progress note and Kx at 10    PT Start Time 1347    PT Stop Time 1427    PT Time Calculation (min) 40 min    Activity Tolerance Patient tolerated treatment well    Behavior During Therapy [facility] for tasks assessed/performed              Past Medical History:  Diagnosis Date   Abnormal electrocardiogram    Abnormal heart sounds    Allergy 2020   upon arrival to [facility]   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   Chronic kidney disease 11/07/2021   Depression 2020   receiving appropriate therapy   Herniated lumbar intervertebral disc    Hypertension    Keratosis    benign   Left bundle branch block    Pacemaker    Pure hypercholesterolemia    Sick sinus syndrome (HCC)    Sleep apnea    VT (ventricular tachycardia) ([place])    Past Surgical History:  Procedure Laterality Date   CARDIOVERSION N/A 09/28/2020   Procedure: CARDIOVERSION;  Surgeon: Acie Fredrickson Wonda Cheng, MD;  Location: [place];  Service: Cardiovascular;  Laterality: N/A;   INSERT / REPLACE / REMOVE PACEMAKER     JOINT REPLACEMENT     partial right knee, medial side   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   Procedure: LEFT TOTAL HIP ARTHROPLASTY ANTERIOR APPROACH;  Surgeon: Mcarthur Rossetti, MD;  Location: [facility];  Service: Orthopedics;  Laterality: Left;   VASECTOMY     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   Rectal bleeding 02/01/2022   Left hip pain 02/01/2022   Environmental and seasonal allergies 02/01/2022   Hypertension 11/07/2021   Chronic kidney disease 11/07/2021   Heart block AV  complete ([place]) 08/26/2020   Atrial tachycardia 08/26/2020   Pacemaker - MDT 08/26/2020   Persistent atrial fibrillation ([place]) 12/22/2019    PCP: Dr Raymond [name]   REFERRING PROVIDER: Dr Vanetta Mulders   REFERRING DIAG:  Diagnosis  M43.16 (ICD-10-CM) - Spondylolisthesis, lumbar region    Rationale for Evaluation and Treatment: Rehabilitation  THERAPY DIAG:  Other abnormalities of gait and mobility  Pain in left hip  ONSET DATE:   SUBJECTIVE:  SUBJECTIVE STATEMENT: My wife thought I was moving better but I don't know. Stressed because I lost my key.   PERTINENT HISTORY:  Arthritis, CKD, Herniated intervertebral discs, depression, Left BBB, THA left, VT  PAIN:  Are you having pain? Yes: NPRS scale: 6-8/10 at this time  Pain location: Lumbar spine Pain description: aching  Aggravating factors: Mornings  Relieving factors: Heat and voltarin  PRECAUTIONS: None  WEIGHT BEARING RESTRICTIONS: No  FALLS:  Has patient fallen in last 6 months? No  LIVING ENVIRONMENT: Lives with his wife  OCCUPATION: retired form the [employer]  PLOF: Independent  PATIENT GOALS: to have less pain    OBJECTIVE:   PALPATION: Spasming in the lumbar spine .   LUMBAR ROM:   AROM eval  Flexion Full   Extension Full   Right lateral flexion   Left lateral flexion   Right rotation Limited 25%   Left rotation Limited 25%    (Blank rows = not tested)  LOWER EXTREMITY ROM:     Passive  Right eval Left eval  Hip flexion Limit  No limit  Hip extension    Hip abduction    Hip adduction    Hip internal rotation Painful  Painful   Hip external rotation No limit No limit  Knee flexion    Knee extension    Ankle dorsiflexion    Ankle plantarflexion    Ankle inversion    Ankle eversion     (Blank rows  = not tested)  LOWER EXTREMITY MMT:    MMT Right eval Left eval  Hip flexion 39.5 30  Hip extension    Hip abduction 49.7 41.7  Hip adduction    Hip internal rotation    Hip external rotation    Knee flexion    Knee extension 61.9 39.5  Ankle dorsiflexion    Ankle plantarflexion    Ankle inversion    Ankle eversion     (Blank rows = not tested)   GAIT: Lateral movement noted. Decreased hip flexion bilatera.   TODAY'S TREATMENT:                                                                                                                              DATE:   Treatment                            12/21/22:  Trigger Point Dry Needling, Manual Therapy Treatment:  Initial or subsequent education regarding Trigger Point Dry Needling: Subsequent Did patient give consent to treatment with Trigger Point Dry Needling: Yes TPDN with skilled palpation and monitoring followed by STM to the following muscles: Lt 4-5 paraspinals, glut max, med, piriformis   Hesch self treatment for L5 S1   1/22 Trigger Point Dry-Needling  Treatment instructions: Expect mild to moderate muscle soreness. S/S of pneumothorax if dry needled over a lung field, and to seek immediate medical attention should  they occur. Patient verbalized understanding of these instructions and education.  Patient Consent Given: Yes Education handout provided:Yes  Muscles treated: bilateral gluteal 3 spots  Electrical stimulation performed: No Parameters: N/A Treatment response/outcome: great twitch    Manual: trigger point release to gluteal lower lumbar spine and education on self soft tissue mobilization   Gym exercises:  Cable pull down 2x10 10 lbs  Cable row 2x10 10 lbs   Hip abdcution machine 2x15 55 lbs     1/17 Trigger Point Dry-Needling  Treatment instructions: Expect mild to moderate muscle soreness. S/S of pneumothorax if dry needled over a lung field, and to seek immediate medical attention should  they occur. Patient verbalized understanding of these instructions and education.  Patient Consent Given: Yes Education handout provided:Yes  Muscles treated: bilateral gluteal 3 spots  Electrical stimulation performed: No Parameters: N/A Treatment response/outcome: great twitch    Manual: trigger point release yto lguteal and education on self soft tissue mobilization       PATIENT EDUCATION:  Education details: HEP, symptom management  Person educated: Patient Education method: Explanation, Demonstration, Tactile cues, Verbal cues, and Handouts Education comprehension: verbalized understanding, returned demonstration, verbal cues required, tactile cues required, and needs further education  HOME EXERCISE PROGRAM: Access Code: [email or web address] URL: [email or web address] self correction   ASSESSMENT:  CLINICAL IMPRESSION: Unbilled time from 1:50-2:00 as pt went out to his car. Significant spasm noted to extend to the left even though he was feeling more pain on the right. Pt reported feeling improvement following treatment today and provided with handout of Hesch L5-S1 self correction.   OBJECTIVE IMPAIRMENTS: Abnormal gait, decreased activity tolerance, difficulty walking, decreased ROM, decreased strength, increased muscle spasms, and pain.   ACTIVITY LIMITATIONS: lifting, bending, sitting, standing, squatting, stairs, transfers, and locomotion level  PARTICIPATION LIMITATIONS: meal prep, cleaning, shopping, community activity, and yard work  PERSONAL FACTORS: Age and 1-2 comorbidities: left hip replacement  are also affecting patient's functional outcome.   REHAB POTENTIAL:  Good CLINICAL DECISION MAKING: Evolving/moderate complexity  EVALUATION COMPLEXITY: Low   GOALS: Goals reviewed with patient? Yes  SHORT TERM GOALS: Target date: 12/27/2022    Patient will demonstrate full lumbar rotation without pain Baseline: Goal status: INITIAL  2.   Patient will report a 50% reduction in spasming in the morning Baseline:  Goal status: INITIAL  3.  Patient will increase gross left lower extremity strength by 10 pounds Baseline:  Goal status: INITIAL   LONG TERM GOALS: Target date: 01/24/2023    Patient will report complete resolution of spasming in the morning in order to improve his ability to perform ADLs Baseline:  Goal status: INITIAL  2.  Patient will have a complete exercise program including gym activity without increased pain. Baseline:  Goal status: INITIAL  3.  Left lower extremity strength will be within 5 pounds of right lower extremity strength to improve symmetry with gait Baseline:  Goal status: INITIAL PLAN:  PT FREQUENCY: 2x/week  PT DURATION: 8 weeks  PLANNED INTERVENTIONS: Therapeutic exercises, Therapeutic activity, Neuromuscular re-education, Balance training, Gait training, Patient/Family education, Self Care, Joint mobilization, Joint manipulation, DME instructions, Aquatic Therapy, Electrical stimulation, Cryotherapy, Moist heat, Taping, Ultrasound, and Manual therapy.  PLAN FOR NEXT SESSION: TPDN prn, core stability  Coutney Wildermuth C. Philipe Laswell PT, DPT 12/22/22 11:49 AM

## 2022-12-25 ENCOUNTER — Ambulatory Visit: Payer: Medicare Other | Attending: Internal Medicine | Admitting: Internal Medicine

## 2022-12-25 ENCOUNTER — Encounter: Payer: Self-pay | Admitting: Internal Medicine

## 2022-12-25 VITALS — BP 126/74 | HR 81 | Ht 69.0 in | Wt 171.0 lb

## 2022-12-25 DIAGNOSIS — I442 Atrioventricular block, complete: Secondary | ICD-10-CM | POA: Diagnosis not present

## 2022-12-25 DIAGNOSIS — I4819 Other persistent atrial fibrillation: Secondary | ICD-10-CM | POA: Diagnosis not present

## 2022-12-25 DIAGNOSIS — Z950 Presence of cardiac pacemaker: Secondary | ICD-10-CM | POA: Diagnosis not present

## 2022-12-25 DIAGNOSIS — I4719 Other supraventricular tachycardia: Secondary | ICD-10-CM | POA: Diagnosis not present

## 2022-12-25 NOTE — Patient Instructions (Signed)
Medication Instructions:  Your physician recommends that you continue on your current medications as directed. Please refer to the Current Medication list given to you today.  *If you need a refill on your cardiac medications before your next appointment, please call your pharmacy*   Lab Work: None ordered.  If you have labs (blood work) drawn today and your tests are completely normal, you will receive your results only by: MyChart Message (if you have MyChart) OR A paper copy in the mail If you have any lab test that is abnormal or we need to change your treatment, we will call you to review the results.   Testing/Procedures: None ordered.    Follow-Up: At [facility], you and your health needs are our priority.  As part of our continuing mission to provide you with exceptional heart care, we have created designated Provider Care Teams.  These Care Teams include your primary Cardiologist (physician) and Advanced Practice Providers (APPs -  Physician Assistants and Nurse Practitioners) who all work together to provide you with the care you need, when you need it.  We recommend signing up for the patient portal called "MyChart".  Sign up information is provided on this After Visit Summary.  MyChart is used to connect with patients for Virtual Visits (Telemedicine).  Patients are able to view lab/test results, encounter notes, upcoming appointments, etc.  Non-urgent messages can be sent to your provider as well.   To learn more about what you can do with MyChart, go to [email or web address].    Your next appointment:   3 months with Dr Klein 

## 2022-12-25 NOTE — Progress Notes (Signed)
ELECTROPHYSIOLOGY OFFICE  NOTE  Patient ID: Jimmy Palmer, MRN: [id number], DOB/AGE: [birth year] 80 y.o. Admit date: (Not on file) Date of Consult: 12/25/2022  Primary Physician: [name], Blondell Reveal, MD Primary Cardiologist    HPI Jimmy Palmer is a 82 y.o. 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  Anticoagulation with Apixoban     When last seen, persistent atrial fibrillation/flutter noted.  Cardioversion undertaken.  Symptoms much improved with less fatigue.  While not able to have identified symptoms associated with the fibrillation/flutter, noted a significant difference following cardioversion. No interval atrial fibrillation   He has been maintaining regular rhythm and feeling better. He is compliant in taking his blood thinner. He endorses occasional bleeding due to hemorrhoids but they are being monitored.  But none other  He received a self-monitoring device. However, he preferred to be seen in person and has not used the device.   The patient denies chest pain, shortness of breath, nocturnal dyspnea, orthopnea or peripheral edema.  There have been no palpitations, lightheadedness or syncope.    Date Cr K Hgb  9/19 1.08 4.2     1/21 1.33 4.5 13.6  12/22 1.36 4.3 13.5    DATE TEST EF   8/14 Echo   60 %   4/16 MYOVIEW   56 %  normal perfusion        Thromboembolic risk factors ( age  -2, HTN-1) for a CHADSVASc Score of >=3   Past Medical History:  Diagnosis Date   Abnormal electrocardiogram    Abnormal heart sounds    Allergy 2020   [facility]   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   Chronic kidney disease 11/07/2021   Depression 2020   receiving appropriate therapy   Herniated lumbar intervertebral disc    Hypertension    Keratosis    benign   Left bundle branch block    Pacemaker    Pure  hypercholesterolemia    Sick sinus syndrome (HCC)    Sleep apnea    VT (ventricular tachycardia) ([place])       Surgical History:  Past Surgical History:  Procedure Laterality Date   CARDIOVERSION N/A 09/28/2020   Procedure: CARDIOVERSION;  Surgeon: Nahser, Wonda Cheng, MD;  Location: [place];  Service: Cardiovascular;  Laterality: N/A;   INSERT / REPLACE / REMOVE PACEMAKER     JOINT REPLACEMENT     partial right knee, medial side   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   Procedure: LEFT TOTAL HIP ARTHROPLASTY ANTERIOR APPROACH;  Surgeon: Mcarthur Rossetti, MD;  Location: [facility];  Service: Orthopedics;  Laterality: Left;   VASECTOMY       Current Meds  Medication Sig   apixaban (ELIQUIS) 5 MG TABS tablet Take 1 tablet (5 mg total) by mouth 2 (two) times daily.   baclofen (LIORESAL) 10 MG tablet TAKE 1 TABLET(10 MG) BY MOUTH THREE TIMES DAILY AS NEEDED FOR MUSCLE SPASMS   buPROPion (WELLBUTRIN SR) 150 MG 12 hr tablet TAKE 1 TABLET BY MOUTH EVERY MORNING   diclofenac Sodium (VOLTAREN) 1 % GEL Apply 1 application topically as needed (pain).    diphenhydrAMINE (BENADRYL) 25 mg capsule Take 25 mg by mouth at bedtime.    doxazosin (CARDURA) 4 MG tablet TAKE 1 TABLET BY MOUTH  EVERY DAY   losartan-hydrochlorothiazide (HYZAAR) 100-12.5 MG tablet TAKE 1 TABLET BY MOUTH EVERY DAY   Melatonin 5 MG TABS Take 5 mg by mouth at bedtime.    methocarbamol (ROBAXIN) 500 MG tablet Take 1 tablet (500 mg total) by mouth 4 (four) times daily.   Multiple Vitamin (MULTIVITAMIN PO) Take 2 tablets by mouth once a week.   phenylephrine (,USE FOR PREPARATION-H,) 0.25 % suppository Place 1 suppository rectally 2 (two) times daily as needed for hemorrhoids.   sertraline (ZOLOFT) 25 MG tablet TAKE 1 TABLET BY MOUTH EVERY DAY   simvastatin (ZOCOR) 40 MG tablet TAKE 1 TABLET BY MOUTH EVERY EVENING   triamcinolone (NASACORT) 55 MCG/ACT AERO nasal inhaler Place 2 sprays into the nose daily. (Patient taking  differently: Place 2 sprays into the nose daily as needed (allergies).)     Allergies:  Allergies  Allergen Reactions   Amoxicillin Diarrhea         All other systems reviewed and negative.   BP 126/74   Pulse 81   Ht 5\' 9"  (1.753 m)   Wt 171 lb (77.6 kg)   SpO2 98%   BMI 25.25 kg/m   Well developed and well nourished in no acute distress HENT normal Neck supple with JVP-flat Clear Device pocket well healed; without hematoma or erythema.  There is no tethering  Regular rate and rhythm, no  gallop No murmur Abd-soft with active BS No Clubbing cyanosis  edema Skin-warm and dry A & Oriented  Grossly normal sensory and motor function  ECG AV pacing  Device function is normal.  Battery approaching ERI Programming changes none  See Paceart for details    Assessment and Plan:   Complete heart block  Pacemaker Med[employer]       Atrial tach  Hypertension  Pocket Tethering   Atrial fibrillation persistent   ETOH use acknowledges less   No interval atrial fibrillation.  Will continue the Eliquis at 5 mg twice daily.  Bulimic, continue his hydrochlorothiazide 12.5.  Blood pressure well-controlled, will continue him on losartan HCT. Device is approaching ERI.  We reviewed duplications of end-of-life battery behavior i.e. VVI 65 Virl Axe

## 2022-12-26 ENCOUNTER — Ambulatory Visit (HOSPITAL_BASED_OUTPATIENT_CLINIC_OR_DEPARTMENT_OTHER): Payer: Medicare Other | Admitting: Physical Therapy

## 2022-12-26 ENCOUNTER — Other Ambulatory Visit (HOSPITAL_BASED_OUTPATIENT_CLINIC_OR_DEPARTMENT_OTHER): Payer: Self-pay

## 2022-12-26 DIAGNOSIS — I442 Atrioventricular block, complete: Secondary | ICD-10-CM

## 2022-12-26 DIAGNOSIS — Z96642 Presence of left artificial hip joint: Secondary | ICD-10-CM

## 2022-12-26 DIAGNOSIS — M25552 Pain in left hip: Secondary | ICD-10-CM | POA: Diagnosis not present

## 2022-12-26 DIAGNOSIS — M25652 Stiffness of left hip, not elsewhere classified: Secondary | ICD-10-CM

## 2022-12-26 DIAGNOSIS — R2689 Other abnormalities of gait and mobility: Secondary | ICD-10-CM

## 2022-12-26 MED ORDER — DOXAZOSIN MESYLATE 4 MG PO TABS
4.0000 mg | ORAL_TABLET | Freq: Every day | ORAL | 1 refills | Status: DC
  Filled 2022-12-28 – 2023-01-24 (×2): qty 90, 90d supply, fill #0
  Filled 2023-04-25: qty 90, 90d supply, fill #1

## 2022-12-26 NOTE — Therapy (Signed)
OUTPATIENT PHYSICAL THERAPY THORACOLUMBAR EVALUATION   Patient Name: Jimmy Palmer MRN: [id number] DOB:07-02-1941, 82 y.o., male Today's Date: 12/26/2022  END OF SESSION:     Past Medical History:  Diagnosis Date   Abnormal electrocardiogram    Abnormal heart sounds    Allergy 2020   upon arrival to [facility]   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   Chronic kidney disease 11/07/2021   Depression 2020   receiving appropriate therapy   Herniated lumbar intervertebral disc    Hypertension    Keratosis    benign   Left bundle branch block    Pacemaker    Pure hypercholesterolemia    Sick sinus syndrome (HCC)    Sleep apnea    VT (ventricular tachycardia) ([place])    Past Surgical History:  Procedure Laterality Date   CARDIOVERSION N/A 09/28/2020   Procedure: CARDIOVERSION;  Surgeon: Acie Fredrickson Wonda Cheng, MD;  Location: [place];  Service: Cardiovascular;  Laterality: N/A;   INSERT / REPLACE / REMOVE PACEMAKER     JOINT REPLACEMENT     partial right knee, medial side   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   Procedure: LEFT TOTAL HIP ARTHROPLASTY ANTERIOR APPROACH;  Surgeon: Mcarthur Rossetti, MD;  Location: [facility];  Service: Orthopedics;  Laterality: Left;   VASECTOMY     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   Rectal bleeding 02/01/2022   Left hip pain 02/01/2022   Environmental and seasonal allergies 02/01/2022   Hypertension 11/07/2021   Chronic kidney disease 11/07/2021   Heart block AV complete ([place]) 08/26/2020   Atrial tachycardia 08/26/2020   Pacemaker - MDT 08/26/2020   Persistent atrial fibrillation ([place]) 12/22/2019    PCP: Dr Raymond [name]   REFERRING PROVIDER: Dr Vanetta Mulders   REFERRING DIAG:  Diagnosis  M43.16 (ICD-10-CM) - Spondylolisthesis, lumbar region    Rationale for Evaluation and Treatment: Rehabilitation  THERAPY DIAG:   No diagnosis found.  ONSET DATE:   SUBJECTIVE:                                                                                                                                                                                           SUBJECTIVE STATEMENT: My wife thought I was moving better but I don't know. Stressed because I lost my key.   PERTINENT HISTORY:  Arthritis, CKD, Herniated intervertebral discs, depression, Left BBB, THA left, VT  PAIN:  Are you having pain? Yes: NPRS scale: 6-8/10 at this time  Pain location: Lumbar spine Pain description: aching  Aggravating factors: Mornings  Relieving factors: Heat and voltarin  PRECAUTIONS: None  WEIGHT BEARING RESTRICTIONS: No  FALLS:  Has patient fallen in last 6 months? No  LIVING ENVIRONMENT: Lives with his wife  OCCUPATION: retired form the [employer]  PLOF: Independent  PATIENT GOALS: to have less pain    OBJECTIVE:   PALPATION: Spasming in the lumbar spine .   LUMBAR ROM:   AROM eval  Flexion Full   Extension Full   Right lateral flexion   Left lateral flexion   Right rotation Limited 25%   Left rotation Limited 25%    (Blank rows = not tested)  LOWER EXTREMITY ROM:     Passive  Right eval Left eval  Hip flexion Limit  No limit  Hip extension    Hip abduction    Hip adduction    Hip internal rotation Painful  Painful   Hip external rotation No limit No limit  Knee flexion    Knee extension    Ankle dorsiflexion    Ankle plantarflexion    Ankle inversion    Ankle eversion     (Blank rows = not tested)  LOWER EXTREMITY MMT:    MMT Right eval Left eval  Hip flexion 39.5 30  Hip extension    Hip abduction 49.7 41.7  Hip adduction    Hip internal rotation    Hip external rotation    Knee flexion    Knee extension 61.9 39.5  Ankle dorsiflexion    Ankle plantarflexion    Ankle inversion    Ankle eversion     (Blank rows = not tested)   GAIT: Lateral movement noted. Decreased hip  flexion bilatera.   TODAY'S TREATMENT:                                                                                                                              DATE:  1/30 Manual: trigger point release to lower lumbar spine  LTRx20  Piriformis stretch 3x20 sec hold   Supine March x20  Supine hip abduction x20  green  1/2 bridge 2x10   Standing march x20  Standing hip abduction 2x10  Standing hip extension 2x10   All with cuing for core stability      Treatment                            12/21/22:  Trigger Point Dry Needling, Manual Therapy Treatment:  Initial or subsequent education regarding Trigger Point Dry Needling: Subsequent Did patient give consent to treatment with Trigger Point Dry Needling: Yes TPDN with skilled palpation and monitoring followed by STM to the following muscles: Lt 4-5 paraspinals, glut max, med, piriformis   Hesch self treatment for L5 S1   1/22 Trigger Point Dry-Needling  Treatment instructions: Expect mild to moderate muscle soreness. S/S of pneumothorax if dry needled over  a lung field, and to seek immediate medical attention should they occur. Patient verbalized understanding of these instructions and education.  Patient Consent Given: Yes Education handout provided:Yes  Muscles treated: bilateral gluteal 3 spots  Electrical stimulation performed: No Parameters: N/A Treatment response/outcome: great twitch    Manual: trigger point release to gluteal lower lumbar spine and education on self soft tissue mobilization   Gym exercises:  Cable pull down 2x10 10 lbs  Cable row 2x10 10 lbs   Hip abdcution machine 2x15 55 lbs     1/17 Trigger Point Dry-Needling  Treatment instructions: Expect mild to moderate muscle soreness. S/S of pneumothorax if dry needled over a lung field, and to seek immediate medical attention should they occur. Patient verbalized understanding of these instructions and education.  Patient Consent Given:  Yes Education handout provided:Yes  Muscles treated: bilateral gluteal 3 spots  Electrical stimulation performed: No Parameters: N/A Treatment response/outcome: great twitch    Manual: trigger point release yto lguteal and education on self soft tissue mobilization       PATIENT EDUCATION:  Education details: HEP, symptom management  Person educated: Patient Education method: Explanation, Demonstration, Tactile cues, Verbal cues, and Handouts Education comprehension: verbalized understanding, returned demonstration, verbal cues required, tactile cues required, and needs further education  HOME EXERCISE PROGRAM: Access Code: [email or web address] URL: [email or web address] self correction   ASSESSMENT:  CLINICAL IMPRESSION: The patient is making some progress. He has days that are better then others. He came in today with questions about his HEP. He has several HEP's from before. We consolidated them into 3 stretches, 3 supine exercises and 3 standing exercises. We also reviewed his seated mobilization given to him last visit. He did well with it. We held on needling today. We will continue to progress his HEP.  OBJECTIVE IMPAIRMENTS: Abnormal gait, decreased activity tolerance, difficulty walking, decreased ROM, decreased strength, increased muscle spasms, and pain.   ACTIVITY LIMITATIONS: lifting, bending, sitting, standing, squatting, stairs, transfers, and locomotion level  PARTICIPATION LIMITATIONS: meal prep, cleaning, shopping, community activity, and yard work  PERSONAL FACTORS: Age and 1-2 comorbidities: left hip replacement  are also affecting patient's functional outcome.   REHAB POTENTIAL:  Good CLINICAL DECISION MAKING: Evolving/moderate complexity  EVALUATION COMPLEXITY: Low   GOALS: Goals reviewed with patient? Yes  SHORT TERM GOALS: Target date: 12/27/2022    Patient will demonstrate full lumbar rotation without pain Baseline: Goal  status: INITIAL  2.  Patient will report a 50% reduction in spasming in the morning Baseline:  Goal status: INITIAL  3.  Patient will increase gross left lower extremity strength by 10 pounds Baseline:  Goal status: INITIAL   LONG TERM GOALS: Target date: 01/24/2023    Patient will report complete resolution of spasming in the morning in order to improve his ability to perform ADLs Baseline:  Goal status: INITIAL  2.  Patient will have a complete exercise program including gym activity without increased pain. Baseline:  Goal status: INITIAL  3.  Left lower extremity strength will be within 5 pounds of right lower extremity strength to improve symmetry with gait Baseline:  Goal status: INITIAL PLAN:  PT FREQUENCY: 2x/week  PT DURATION: 8 weeks  PLANNED INTERVENTIONS: Therapeutic exercises, Therapeutic activity, Neuromuscular re-education, Balance training, Gait training, Patient/Family education, Self Care, Joint mobilization, Joint manipulation, DME instructions, Aquatic Therapy, Electrical stimulation, Cryotherapy, Moist heat, Taping, Ultrasound, and Manual therapy.  PLAN FOR NEXT SESSION: TPDN prn, core stability  Carolyne Littles PT DPT  12/26/22  1:56 PM

## 2022-12-27 ENCOUNTER — Other Ambulatory Visit (HOSPITAL_BASED_OUTPATIENT_CLINIC_OR_DEPARTMENT_OTHER): Payer: Self-pay

## 2022-12-28 ENCOUNTER — Other Ambulatory Visit (HOSPITAL_BASED_OUTPATIENT_CLINIC_OR_DEPARTMENT_OTHER): Payer: Self-pay

## 2022-12-28 ENCOUNTER — Encounter (HOSPITAL_BASED_OUTPATIENT_CLINIC_OR_DEPARTMENT_OTHER): Payer: Self-pay | Admitting: Physical Therapy

## 2022-12-28 ENCOUNTER — Ambulatory Visit (HOSPITAL_BASED_OUTPATIENT_CLINIC_OR_DEPARTMENT_OTHER): Payer: Medicare Other | Attending: Orthopaedic Surgery | Admitting: Physical Therapy

## 2022-12-28 DIAGNOSIS — M25552 Pain in left hip: Secondary | ICD-10-CM | POA: Diagnosis present

## 2022-12-28 DIAGNOSIS — M25652 Stiffness of left hip, not elsewhere classified: Secondary | ICD-10-CM | POA: Insufficient documentation

## 2022-12-28 DIAGNOSIS — R2689 Other abnormalities of gait and mobility: Secondary | ICD-10-CM | POA: Insufficient documentation

## 2022-12-28 DIAGNOSIS — Z96642 Presence of left artificial hip joint: Secondary | ICD-10-CM | POA: Diagnosis present

## 2022-12-28 MED FILL — Bupropion HCl Tab ER 12HR 150 MG: ORAL | 90 days supply | Qty: 90 | Fill #0 | Status: AC

## 2022-12-28 NOTE — Therapy (Signed)
OUTPATIENT PHYSICAL THERAPY THORACOLUMBAR EVALUATION   Patient Name: Jimmy Palmer MRN: [id number] DOB:03-Nov-1941, 82 y.o., male Today's Date: 12/28/2022  END OF SESSION:  PT End of Session - 12/28/22 1352     Visit Number 8    Number of Visits 16    Date for PT Re-Evaluation 01/24/23    Authorization Type progress note and Kx at 10    PT Start Time 0932    PT Stop Time 1427    PT Time Calculation (min) 42 min    Activity Tolerance Patient tolerated treatment well    Behavior During Therapy [facility] for tasks assessed/performed               Past Medical History:  Diagnosis Date   Abnormal electrocardiogram    Abnormal heart sounds    Allergy 2020   upon arrival to [facility]   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   Chronic kidney disease 11/07/2021   Depression 2020   receiving appropriate therapy   Herniated lumbar intervertebral disc    Hypertension    Keratosis    benign   Left bundle branch block    Pacemaker    Pure hypercholesterolemia    Sick sinus syndrome (HCC)    Sleep apnea    VT (ventricular tachycardia) ([place])    Past Surgical History:  Procedure Laterality Date   CARDIOVERSION N/A 09/28/2020   Procedure: CARDIOVERSION;  Surgeon: Acie Fredrickson Wonda Cheng, MD;  Location: [place];  Service: Cardiovascular;  Laterality: N/A;   INSERT / REPLACE / REMOVE PACEMAKER     JOINT REPLACEMENT     partial right knee, medial side   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   Procedure: LEFT TOTAL HIP ARTHROPLASTY ANTERIOR APPROACH;  Surgeon: Mcarthur Rossetti, MD;  Location: [facility];  Service: Orthopedics;  Laterality: Left;   VASECTOMY     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   Rectal bleeding 02/01/2022   Left hip pain 02/01/2022   Environmental and seasonal allergies 02/01/2022   Hypertension 11/07/2021   Chronic kidney disease 11/07/2021   Heart block  AV complete ([place]) 08/26/2020   Atrial tachycardia 08/26/2020   Pacemaker - MDT 08/26/2020   Persistent atrial fibrillation ([place]) 12/22/2019    PCP: Dr Raymond [name]   REFERRING PROVIDER: Dr Vanetta Mulders   REFERRING DIAG:  Diagnosis  M43.16 (ICD-10-CM) - Spondylolisthesis, lumbar region    Rationale for Evaluation and Treatment: Rehabilitation  THERAPY DIAG:  Other abnormalities of gait and mobility  Pain in left hip  Status post total replacement of left hip  Stiffness of left hip, not elsewhere classified  ONSET DATE:   SUBJECTIVE:  SUBJECTIVE STATEMENT: The patient feels the manual therapy helped more then the needling. We will focus more on that today. He feels like the duration of the cramps has improved.    PERTINENT HISTORY:  Arthritis, CKD, Herniated intervertebral discs, depression, Left BBB, THA left, VT  PAIN:  Are you having pain? Yes: NPRS scale: 6-8/10 at this time  Pain location: Lumbar spine Pain description: aching  Aggravating factors: Mornings  Relieving factors: Heat and voltarin  PRECAUTIONS: None  WEIGHT BEARING RESTRICTIONS: No  FALLS:  Has patient fallen in last 6 months? No  LIVING ENVIRONMENT: Lives with his wife  OCCUPATION: retired form the [employer]  PLOF: Independent  PATIENT GOALS: to have less pain    OBJECTIVE:   PALPATION: Spasming in the lumbar spine .   LUMBAR ROM:   AROM eval  Flexion Full   Extension Full   Right lateral flexion   Left lateral flexion   Right rotation Limited 25%   Left rotation Limited 25%    (Blank rows = not tested)  LOWER EXTREMITY ROM:     Passive  Right eval Left eval  Hip flexion Limit  No limit  Hip extension    Hip abduction    Hip adduction    Hip internal rotation Painful  Painful   Hip  external rotation No limit No limit  Knee flexion    Knee extension    Ankle dorsiflexion    Ankle plantarflexion    Ankle inversion    Ankle eversion     (Blank rows = not tested)  LOWER EXTREMITY MMT:    MMT Right eval Left eval  Hip flexion 39.5 30  Hip extension    Hip abduction 49.7 41.7  Hip adduction    Hip internal rotation    Hip external rotation    Knee flexion    Knee extension 61.9 39.5  Ankle dorsiflexion    Ankle plantarflexion    Ankle inversion    Ankle eversion     (Blank rows = not tested)   GAIT: Lateral movement noted. Decreased hip flexion bilatera.   TODAY'S TREATMENT:                                                                                                                              DATE:  2/1 Manual: trigger point release to lower lumbar spine/ upper gluteal   Gym: leg press 2x15 25 lbs  Cable row 2x15 10 lbs  Cable extension 2x15 10 lbs   Hip abduction 2x15 15 lbs     1/30 Manual: trigger point release to lower lumbar spine  LTRx20  Piriformis stretch 3x20 sec hold   Supine March x20  Supine hip abduction x20  green  1/2 bridge 2x10   Standing march x20  Standing hip abduction 2x10  Standing hip extension 2x10   All with cuing for core stability      Treatment  12/21/22:  Trigger Point Dry Needling, Manual Therapy Treatment:  Initial or subsequent education regarding Trigger Point Dry Needling: Subsequent Did patient give consent to treatment with Trigger Point Dry Needling: Yes TPDN with skilled palpation and monitoring followed by STM to the following muscles: Lt 4-5 paraspinals, glut max, med, piriformis   Hesch self treatment for L5 S1   1/22 Trigger Point Dry-Needling  Treatment instructions: Expect mild to moderate muscle soreness. S/S of pneumothorax if dry needled over a lung field, and to seek immediate medical attention should they occur. Patient verbalized understanding of  these instructions and education.  Patient Consent Given: Yes Education handout provided:Yes  Muscles treated: bilateral gluteal 3 spots  Electrical stimulation performed: No Parameters: N/A Treatment response/outcome: great twitch    Manual: trigger point release to gluteal lower lumbar spine and education on self soft tissue mobilization   Gym exercises:  Cable pull down 2x10 10 lbs  Cable row 2x10 10 lbs   Hip abdcution machine 2x15 55 lbs     1/17 Trigger Point Dry-Needling  Treatment instructions: Expect mild to moderate muscle soreness. S/S of pneumothorax if dry needled over a lung field, and to seek immediate medical attention should they occur. Patient verbalized understanding of these instructions and education.  Patient Consent Given: Yes Education handout provided:Yes  Muscles treated: bilateral gluteal 3 spots  Electrical stimulation performed: No Parameters: N/A Treatment response/outcome: great twitch    Manual: trigger point release yto lguteal and education on self soft tissue mobilization       PATIENT EDUCATION:  Education details: HEP, symptom management  Person educated: Patient Education method: Explanation, Demonstration, Tactile cues, Verbal cues, and Handouts Education comprehension: verbalized understanding, returned demonstration, verbal cues required, tactile cues required, and needs further education  HOME EXERCISE PROGRAM: Access Code: [email or web address] URL: [email or web address] self correction   ASSESSMENT:  CLINICAL IMPRESSION: We focused on manual therapy to the gluteal and lower back . He tolerated well. He reports it is the pain he feels in the morning. We also reviewed gym exercises. He had no significant pain. Therapy will continue to progress as tolerated.   OBJECTIVE IMPAIRMENTS: Abnormal gait, decreased activity tolerance, difficulty walking, decreased ROM, decreased strength, increased muscle spasms, and  pain.   ACTIVITY LIMITATIONS: lifting, bending, sitting, standing, squatting, stairs, transfers, and locomotion level  PARTICIPATION LIMITATIONS: meal prep, cleaning, shopping, community activity, and yard work  PERSONAL FACTORS: Age and 1-2 comorbidities: left hip replacement  are also affecting patient's functional outcome.   REHAB POTENTIAL:  Good CLINICAL DECISION MAKING: Evolving/moderate complexity  EVALUATION COMPLEXITY: Low   GOALS: Goals reviewed with patient? Yes  SHORT TERM GOALS: Target date: 12/27/2022    Patient will demonstrate full lumbar rotation without pain Baseline: Goal status: INITIAL  2.  Patient will report a 50% reduction in spasming in the morning Baseline:  Goal status: INITIAL  3.  Patient will increase gross left lower extremity strength by 10 pounds Baseline:  Goal status: INITIAL   LONG TERM GOALS: Target date: 01/24/2023    Patient will report complete resolution of spasming in the morning in order to improve his ability to perform ADLs Baseline:  Goal status: INITIAL  2.  Patient will have a complete exercise program including gym activity without increased pain. Baseline:  Goal status: INITIAL  3.  Left lower extremity strength will be within 5 pounds of right lower extremity strength to improve symmetry with gait Baseline:  Goal status: INITIAL PLAN:  PT FREQUENCY: 2x/week  PT DURATION: 8 weeks  PLANNED INTERVENTIONS: Therapeutic exercises, Therapeutic activity, Neuromuscular re-education, Balance training, Gait training, Patient/Family education, Self Care, Joint mobilization, Joint manipulation, DME instructions, Aquatic Therapy, Electrical stimulation, Cryotherapy, Moist heat, Taping, Ultrasound, and Manual therapy.  PLAN FOR NEXT SESSION: TPDN prn, core stability  Carolyne Littles PT DPT  12/28/22 2:14 PM

## 2022-12-29 ENCOUNTER — Other Ambulatory Visit (HOSPITAL_BASED_OUTPATIENT_CLINIC_OR_DEPARTMENT_OTHER): Payer: Self-pay

## 2023-01-01 ENCOUNTER — Ambulatory Visit (HOSPITAL_BASED_OUTPATIENT_CLINIC_OR_DEPARTMENT_OTHER): Payer: Medicare Other | Admitting: Physical Therapy

## 2023-01-01 ENCOUNTER — Encounter (HOSPITAL_BASED_OUTPATIENT_CLINIC_OR_DEPARTMENT_OTHER): Payer: Self-pay

## 2023-01-01 ENCOUNTER — Telehealth (HOSPITAL_BASED_OUTPATIENT_CLINIC_OR_DEPARTMENT_OTHER): Payer: Self-pay | Admitting: Physical Therapy

## 2023-01-01 NOTE — Telephone Encounter (Signed)
Jimmy Palmer left a message stating he did not know about his appt today, he does have another appt on Wednesday at 12:30. The appt you gave him for tomorrow does not work for him. He apologizes for all the confusion, he said you can give him a call if you need to.

## 2023-01-01 NOTE — Telephone Encounter (Signed)
LVM regarding missed appointment today at 1:00   Parnell. Jimmy Palmer PT, DPT 01/01/23 1:13 PM

## 2023-01-02 ENCOUNTER — Encounter (HOSPITAL_BASED_OUTPATIENT_CLINIC_OR_DEPARTMENT_OTHER): Payer: Self-pay | Admitting: Physical Therapy

## 2023-01-03 ENCOUNTER — Encounter (HOSPITAL_BASED_OUTPATIENT_CLINIC_OR_DEPARTMENT_OTHER): Payer: Self-pay | Admitting: Physical Therapy

## 2023-01-03 ENCOUNTER — Ambulatory Visit (HOSPITAL_BASED_OUTPATIENT_CLINIC_OR_DEPARTMENT_OTHER): Payer: Medicare Other | Admitting: Physical Therapy

## 2023-01-03 DIAGNOSIS — R2689 Other abnormalities of gait and mobility: Secondary | ICD-10-CM | POA: Diagnosis not present

## 2023-01-03 DIAGNOSIS — M25552 Pain in left hip: Secondary | ICD-10-CM

## 2023-01-03 NOTE — Therapy (Signed)
OUTPATIENT PHYSICAL THERAPY THORACOLUMBAR EVALUATION   Patient Name: Jimmy Palmer MRN: [id number] DOB:[birth year], 82 y.o., male Today's Date: 01/03/2023  END OF SESSION:  PT End of Session - 01/03/23 1234     Visit Number 9    Number of Visits 16    Date for PT Re-Evaluation 01/24/23    Authorization Type progress note and Kx at 10    PT Start Time 1231    PT Stop Time 8563    PT Time Calculation (min) 46 min    Activity Tolerance Patient tolerated treatment well    Behavior During Therapy [facility] for tasks assessed/performed               Past Medical History:  Diagnosis Date   Abnormal electrocardiogram    Abnormal heart sounds    Allergy 2020   upon arrival to [facility]   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   Chronic kidney disease 11/07/2021   Depression 2020   receiving appropriate therapy   Herniated lumbar intervertebral disc    Hypertension    Keratosis    benign   Left bundle branch block    Pacemaker    Pure hypercholesterolemia    Sick sinus syndrome (HCC)    Sleep apnea    VT (ventricular tachycardia) ([place])    Past Surgical History:  Procedure Laterality Date   CARDIOVERSION N/A 09/28/2020   Procedure: CARDIOVERSION;  Surgeon: Acie Fredrickson Wonda Cheng, MD;  Location: [place];  Service: Cardiovascular;  Laterality: N/A;   INSERT / REPLACE / REMOVE PACEMAKER     JOINT REPLACEMENT     partial right knee, medial side   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   Procedure: LEFT TOTAL HIP ARTHROPLASTY ANTERIOR APPROACH;  Surgeon: Mcarthur Rossetti, MD;  Location: [facility];  Service: Orthopedics;  Laterality: Left;   VASECTOMY     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   Rectal bleeding 02/01/2022   Left hip pain 02/01/2022   Environmental and seasonal allergies 02/01/2022   Hypertension 11/07/2021   Chronic kidney disease 11/07/2021   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 It really helped when david worked on my back to loosen it up.    PERTINENT HISTORY:  Arthritis, CKD, Herniated intervertebral discs, depression, Left BBB, THA left, VT  PAIN:  Are you having pain? Yes: NPRS scale: 6-8/10 at this time  Pain location: Lumbar spine Pain description: aching  Aggravating factors: Mornings  Relieving factors: Heat and voltarin  PRECAUTIONS: None  WEIGHT BEARING RESTRICTIONS: No  FALLS:  Has patient fallen in last 6 months? No  LIVING ENVIRONMENT: Lives with his wife  OCCUPATION: retired form the [employer]  PLOF: Independent  PATIENT GOALS: to have less pain    OBJECTIVE:   PALPATION: Spasming in the lumbar spine .   LUMBAR ROM:   AROM eval  Flexion Full   Extension Full   Right lateral flexion   Left lateral flexion   Right rotation Limited 25%   Left rotation Limited 25%    (Blank rows = not tested)  LOWER EXTREMITY ROM:     Passive  Right eval Left eval  Hip flexion Limit  No limit  Hip extension    Hip abduction    Hip adduction    Hip internal rotation Painful  Painful   Hip external rotation No limit No limit  Knee flexion    Knee extension    Ankle dorsiflexion    Ankle plantarflexion    Ankle inversion     Ankle eversion     (Blank rows = not tested)  LOWER EXTREMITY MMT:    MMT Right eval Left eval  Hip flexion 39.5 30  Hip extension    Hip abduction 49.7 41.7  Hip adduction    Hip internal rotation    Hip external rotation    Knee flexion    Knee extension 61.9 39.5  Ankle dorsiflexion    Ankle plantarflexion    Ankle inversion    Ankle eversion     (Blank rows = not tested)   GAIT: Lateral movement noted. Decreased hip flexion bilatera.   TODAY'S TREATMENT:                                                                                                                              DATE:  Treatment                            01/03/23:  MANUAL prone mob to lumbar lateral, thoracic central, bil ribs Side-lying clamshells and hip circles Thoracic extension stretch via shoulder flexion on the wall Reviewed home exercise program Cable machine dead lift, rows, shoulder extension Cybex leg press Cybex knee extension and knee flexion machine   2/1 Manual: trigger point release to lower lumbar spine/ upper gluteal   Gym: leg press 2x15 25 lbs  Cable row 2x15 10 lbs  Cable extension 2x15 10 lbs   Hip abduction 2x15 15 lbs  1/30 Manual: trigger point release to lower lumbar spine  LTRx20  Piriformis stretch 3x20 sec hold   Supine March x20  Supine hip abduction x20  green  1/2 bridge 2x10   Standing march x20  Standing hip abduction 2x10  Standing hip extension 2x10   All with cuing for core stability      Treatment                            12/21/22:  Trigger Point Dry Needling, Manual Therapy Treatment:  Initial or subsequent education regarding Trigger Point Dry Needling: Subsequent Did patient give consent to treatment with Trigger Point Dry Needling: Yes TPDN with skilled palpation and monitoring followed by STM to the following muscles: Lt 4-5 paraspinals, glut max, med, piriformis   Hesch self treatment for L5 S1   1/22 Trigger Point  Dry-Needling  Treatment instructions: Expect mild to moderate muscle soreness. S/S of pneumothorax if dry needled over a lung field, and to seek immediate medical attention should they occur. Patient verbalized understanding of these instructions and education.  Patient Consent Given: Yes Education handout provided:Yes  Muscles treated: bilateral gluteal 3 spots  Electrical stimulation performed: No Parameters: N/A Treatment response/outcome: great twitch    Manual: trigger point release to gluteal lower lumbar spine and education on self soft tissue mobilization   Gym exercises:  Cable pull down 2x10 10 lbs  Cable row 2x10 10 lbs   Hip abdcution machine 2x15 55 lbs     1/17 Trigger Point Dry-Needling  Treatment instructions: Expect mild to moderate muscle soreness. S/S of pneumothorax if dry needled over a lung field, and to seek immediate medical attention should they occur. Patient verbalized understanding of these instructions and education.  Patient Consent Given: Yes Education handout provided:Yes  Muscles treated: bilateral gluteal 3 spots  Electrical stimulation performed: No Parameters: N/A Treatment response/outcome: great twitch    Manual: trigger point release yto lguteal and education on self soft tissue mobilization       PATIENT EDUCATION:  Education details: HEP, symptom management  Person educated: Patient Education method: Explanation, Demonstration, Tactile cues, Verbal cues, and Handouts Education comprehension: verbalized understanding, returned demonstration, verbal cues required, tactile cues required, and needs further education  HOME EXERCISE PROGRAM: Access Code: [email or web address] URL: [email or web address] self correction   ASSESSMENT:  CLINICAL IMPRESSION: Able to improve thoracic mobility on the right side which was very tense at the beginning.  Did not find need for trigger point dry needling today.  Reprinted home  exercise program with altered exercises for current functional level.  Patient reported feeling better already just at the end of the session today.  OBJECTIVE IMPAIRMENTS: Abnormal gait, decreased activity tolerance, difficulty walking, decreased ROM, decreased strength, increased muscle spasms, and pain.   ACTIVITY LIMITATIONS: lifting, bending, sitting, standing, squatting, stairs, transfers, and locomotion level  PARTICIPATION LIMITATIONS: meal prep, cleaning, shopping, community activity, and yard work  PERSONAL FACTORS: Age and 1-2 comorbidities: left hip replacement  are also affecting patient's functional outcome.   REHAB POTENTIAL:  Good CLINICAL DECISION MAKING: Evolving/moderate complexity  EVALUATION COMPLEXITY: Low   GOALS: Goals reviewed with patient? Yes  SHORT TERM GOALS: Target date: 12/27/2022    Patient will demonstrate full lumbar rotation without pain Baseline: Goal status: Achieved  2.  Patient will report a 50% reduction in spasming in the morning Baseline:  Goal status: Achieved  3.  Patient will increase gross left lower  extremity strength by 10 pounds Baseline:  Goal status: To be tested at next visit   LONG TERM GOALS: Target date: 01/24/2023    Patient will report complete resolution of spasming in the morning in order to improve his ability to perform ADLs Baseline:  Goal status: INITIAL  2.  Patient will have a complete exercise program including gym activity without increased pain. Baseline:  Goal status: INITIAL  3.  Left lower extremity strength will be within 5 pounds of right lower extremity strength to improve symmetry with gait Baseline:  Goal status: INITIAL PLAN:  PT FREQUENCY: 2x/week  PT DURATION: 8 weeks  PLANNED INTERVENTIONS: Therapeutic exercises, Therapeutic activity, Neuromuscular re-education, Balance training, Gait training, Patient/Family education, Self Care, Joint mobilization, Joint manipulation, DME  instructions, Aquatic Therapy, Electrical stimulation, Cryotherapy, Moist heat, Taping, Ultrasound, and Manual therapy.  PLAN FOR NEXT SESSION: 10th visit progress note  Brittiney Dicostanzo C. Haralambos Yeatts PT, DPT 01/03/23 2:32 PM

## 2023-01-04 ENCOUNTER — Ambulatory Visit
Admission: RE | Admit: 2023-01-04 | Discharge: 2023-01-04 | Disposition: A | Discharge status: Home / Self Care | Payer: Medicare Other | Source: Ambulatory Visit | Attending: Physical Medicine and Rehabilitation | Admitting: Physical Medicine and Rehabilitation

## 2023-01-04 DIAGNOSIS — M5416 Radiculopathy, lumbar region: Secondary | ICD-10-CM

## 2023-01-07 ENCOUNTER — Other Ambulatory Visit (HOSPITAL_BASED_OUTPATIENT_CLINIC_OR_DEPARTMENT_OTHER): Payer: Self-pay

## 2023-01-07 MED FILL — Sertraline HCl Tab 25 MG: ORAL | 90 days supply | Qty: 90 | Fill #0 | Status: AC

## 2023-01-08 ENCOUNTER — Ambulatory Visit (HOSPITAL_BASED_OUTPATIENT_CLINIC_OR_DEPARTMENT_OTHER): Payer: Medicare Other | Admitting: Physical Therapy

## 2023-01-08 ENCOUNTER — Other Ambulatory Visit (HOSPITAL_BASED_OUTPATIENT_CLINIC_OR_DEPARTMENT_OTHER): Payer: Self-pay

## 2023-01-08 ENCOUNTER — Encounter (HOSPITAL_BASED_OUTPATIENT_CLINIC_OR_DEPARTMENT_OTHER): Payer: Self-pay | Admitting: Physical Therapy

## 2023-01-08 ENCOUNTER — Telehealth: Payer: Self-pay | Admitting: Physical Medicine and Rehabilitation

## 2023-01-08 DIAGNOSIS — M25552 Pain in left hip: Secondary | ICD-10-CM

## 2023-01-08 DIAGNOSIS — R2689 Other abnormalities of gait and mobility: Secondary | ICD-10-CM

## 2023-01-08 NOTE — Telephone Encounter (Signed)
Spoke with patient and scheduled ov for 01/09/23

## 2023-01-08 NOTE — Therapy (Signed)
OUTPATIENT PHYSICAL THERAPY THORACOLUMBAR EVALUATION   Patient Name: Jimmy Palmer MRN: [id number] DOB:August 04, 1941, 82 y.o., male Today's Date: 01/08/2023  END OF SESSION:  PT End of Session - 01/08/23 1253     Visit Number 10    Number of Visits 16    Date for PT Re-Evaluation 01/24/23    Authorization Type progress note and Kx at 10    PT Start Time 1300    PT Stop Time 1343    PT Time Calculation (min) 43 min    Activity Tolerance Patient tolerated treatment well    Behavior During Therapy [facility] for tasks assessed/performed               Past Medical History:  Diagnosis Date   Abnormal electrocardiogram    Abnormal heart sounds    Allergy 2020   upon arrival to [facility]   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   Chronic kidney disease 11/07/2021   Depression 2020   receiving appropriate therapy   Herniated lumbar intervertebral disc    Hypertension    Keratosis    benign   Left bundle branch block    Pacemaker    Pure hypercholesterolemia    Sick sinus syndrome (HCC)    Sleep apnea    VT (ventricular tachycardia) ([place])    Past Surgical History:  Procedure Laterality Date   CARDIOVERSION N/A 09/28/2020   Procedure: CARDIOVERSION;  Surgeon: Acie Fredrickson Wonda Cheng, MD;  Location: [facility];  Service: Cardiovascular;  Laterality: N/A;   INSERT / REPLACE / REMOVE PACEMAKER     JOINT REPLACEMENT     partial right knee, medial side   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   Procedure: LEFT TOTAL HIP ARTHROPLASTY ANTERIOR APPROACH;  Surgeon: Mcarthur Rossetti, MD;  [facility];  Service: Orthopedics;  Laterality: Left;   VASECTOMY     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   Rectal bleeding 02/01/2022   Left hip pain 02/01/2022   Environmental and seasonal allergies 02/01/2022   Hypertension 11/07/2021   Chronic kidney disease 11/07/2021   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  SUBJECTIVE STATEMENT: Going tomorrow for CT imaging and cortisone injection. It really helped when david worked on my back to loosen it up.    PERTINENT HISTORY:  Arthritis, CKD, Herniated intervertebral discs, depression, Left BBB, THA left, VT  PAIN:  Are you having pain? Yes: NPRS scale: 6-8/10 at this time  Pain location: Lumbar spine Pain description: aching  Aggravating factors: Mornings  Relieving factors: Heat and voltarin  PRECAUTIONS: None  WEIGHT BEARING RESTRICTIONS: No  FALLS:  Has patient fallen in last 6 months? No  LIVING ENVIRONMENT: Lives with his wife  OCCUPATION: retired form the [employer]  PLOF: Independent  PATIENT GOALS: to have less pain    OBJECTIVE:   PALPATION: Spasming in the lumbar spine .   LUMBAR ROM:   AROM eval 2/12  Flexion Full    Extension Full    Right lateral flexion    Left lateral flexion    Right rotation Limited 25%  Full no pain  Left rotation Limited 25%  Full no pain   (Blank rows = not tested)  LOWER EXTREMITY ROM:     Passive  Right eval Left eval  Hip flexion Limit  No limit  Hip extension    Hip abduction    Hip adduction    Hip internal rotation  Painful  Painful   Hip external rotation No limit No limit  Knee flexion    Knee extension    Ankle dorsiflexion    Ankle plantarflexion    Ankle inversion    Ankle eversion     (Blank rows = not tested)  LOWER EXTREMITY MMT:    MMT Right eval Left eval Rt/Lt 2/12  Hip flexion 39.5 30 50.8/59.2  Hip extension     Hip abduction 49.7 41.7 38.7/33.7  Hip adduction     Hip internal rotation     Hip external rotation     Knee flexion     Knee extension 61.9 39.5 56.8/69.8  Ankle dorsiflexion     Ankle plantarflexion     Ankle inversion     Ankle eversion      (Blank rows = not tested)   GAIT: Lateral movement noted. Decreased hip flexion bilatera.   TODAY'S TREATMENT:                                                                                                                              DATE:   Treatment                            2/12:  MANUAL: STM & rib mobs to mid thoracic region Hooklying deep breathing LTR Hooklying marching Alt march + knee ext to ceiling   Treatment                            01/03/23:  MANUAL prone mob to lumbar lateral,  thoracic central, bil ribs Side-lying clamshells and hip circles Thoracic extension stretch via shoulder flexion on the wall Reviewed home exercise program Cable machine dead lift, rows, shoulder extension Cybex leg press Cybex knee extension and knee flexion machine   2/1 Manual: trigger point release to lower lumbar spine/ upper gluteal   Gym: leg press 2x15 25 lbs  Cable row 2x15 10 lbs  Cable extension 2x15 10 lbs   Hip abduction 2x15 15 lbs     PATIENT EDUCATION:  Education details: HEP, symptom management  Person educated: Patient Education method: Explanation, Demonstration, Tactile cues, Verbal cues, and Handouts Education comprehension: verbalized understanding, returned demonstration, verbal cues required, tactile cues required, and needs further education  HOME EXERCISE PROGRAM: Access Code:  [email or web address] URL: [email or web address] self correction   ASSESSMENT:  CLINICAL IMPRESSION: Improving coronal spinal levels is successful in decreasing LBP when paired with gentle exercises. Discussed nature of anterolisthesis and rationale for treatment to which pt verbalized understanding. Will complete POC and then transition him to fitness center under watch of fitness specialists.   OBJECTIVE IMPAIRMENTS: Abnormal gait, decreased activity tolerance, difficulty walking, decreased ROM, decreased strength, increased muscle spasms, and pain.   ACTIVITY LIMITATIONS: lifting, bending, sitting, standing, squatting, stairs, transfers, and locomotion level  PARTICIPATION LIMITATIONS: meal prep, cleaning, shopping, community activity, and yard work  PERSONAL FACTORS: Age and 1-2 comorbidities: left hip replacement  are also affecting patient's functional outcome.   REHAB POTENTIAL:  Good CLINICAL DECISION MAKING: Evolving/moderate complexity  EVALUATION COMPLEXITY: Low   GOALS: Goals reviewed with patient? Yes  SHORT TERM GOALS: Target date: 12/27/2022    Patient will demonstrate full lumbar rotation without pain Baseline: Goal status: Achieved  2.  Patient will report a 50% reduction in spasming in the morning Baseline:  Goal status: Achieved  3.  Patient will increase gross left lower extremity strength by 10 pounds Baseline:  Goal status: To be tested at next visit   LONG TERM GOALS: Target date: 01/24/2023    Patient will report complete resolution of spasming in the morning in order to improve his ability to perform ADLs Baseline:  Goal status: INITIAL  2.  Patient will have a complete exercise program including gym activity without increased pain. Baseline:  Goal status: INITIAL  3.  Left lower extremity strength will be within 5 pounds of right lower extremity strength to improve symmetry with gait Baseline:  Goal status:  INITIAL PLAN:  PT FREQUENCY: 2x/week  PT DURATION: 8 weeks  PLANNED INTERVENTIONS: Therapeutic exercises, Therapeutic activity, Neuromuscular re-education, Balance training, Gait training, Patient/Family education, Self Care, Joint mobilization, Joint manipulation, DME instructions, Aquatic Therapy, Electrical stimulation, Cryotherapy, Moist heat, Taping, Ultrasound, and Manual therapy.  PLAN FOR NEXT SESSION: cont flexion bias program  Harrie Cazarez C. Rukia Mcgillivray PT, DPT 01/08/23 3:28 PM

## 2023-01-08 NOTE — Telephone Encounter (Signed)
Please call this patient , he missed a call last week.

## 2023-01-09 ENCOUNTER — Encounter: Payer: Self-pay | Admitting: Physical Medicine and Rehabilitation

## 2023-01-09 ENCOUNTER — Ambulatory Visit (INDEPENDENT_AMBULATORY_CARE_PROVIDER_SITE_OTHER): Payer: Medicare Other | Admitting: Physical Medicine and Rehabilitation

## 2023-01-09 DIAGNOSIS — M5416 Radiculopathy, lumbar region: Secondary | ICD-10-CM

## 2023-01-09 DIAGNOSIS — M4726 Other spondylosis with radiculopathy, lumbar region: Secondary | ICD-10-CM

## 2023-01-09 DIAGNOSIS — M48062 Spinal stenosis, lumbar region with neurogenic claudication: Secondary | ICD-10-CM | POA: Diagnosis not present

## 2023-01-09 DIAGNOSIS — M4316 Spondylolisthesis, lumbar region: Secondary | ICD-10-CM

## 2023-01-09 NOTE — Progress Notes (Signed)
[name] - 82 y.o. male MRN [id number]  Date of birth: 03-28-1941  Office Visit Note: Visit Date: 01/09/2023 PCP: [name], [name], MD Referred by: [name], [name], MD  Subjective: Chief Complaint  Patient presents with   Lower Back - Pain   Left Leg - Pain   Right Leg - Pain   HPI: Jimmy Palmer is a 82 y.o. male who comes in today per request of Dr. Vanetta Mulders for evaluation of chronic, worsening and severe bilateral lower back pain radiating to buttocks and hips. Pain ongoing for several years. Pain worsens with standing and walking. He describes his pain as spasms, currently rates as 8 out of 10. Some relief of pain with home exercise regimen, heating pad and use of medications. States he does use Voltaren gel frequently. He is currently attending formal physical therapy at [facility], he reports significant relief of pain with these treatments. Recent CT of lumbar spine exhibits multi level spondylosis, grade 1 anterolisthesis of L4 on L5 and moderate spinal canal stenosis noted at the L4-L5 level. Patient relocated to [place] several years ago from [place], [place]. He did undergo multiple lumbar epidural steroid injections while living in [place], no relief of pain with these injections. He was treated by Dr. Melina Schools and Dr. Suella Broad with [facility] in 2020. Reports multiple lumbar epidural steroid injections with Dr. Nelva Bush with some relief of pain. He describes these injections as 1 shot, possibly interlaminar approach. History of surgical consultation with Dr. Erline Levine at [facility]. I am unable to see his notes, however patient did not undergo surgery. Patient states he is very active, does attend [employer] multiple times a week. Patient denies focal weakness, numbness and tingling. No recent trauma or falls.      Review of Systems  Musculoskeletal:  Positive for back pain.  Neurological:  Negative for tingling, sensory  change, focal weakness and weakness.  All other systems reviewed and are negative.  Otherwise per HPI.  Assessment & Plan: Visit Diagnoses:    ICD-10-CM   1. Lumbar radiculopathy  M54.16 Ambulatory referral to Physical Medicine Rehab    2. Spinal stenosis of lumbar region with neurogenic claudication  M48.062 Ambulatory referral to Physical Medicine Rehab    3. Anterolisthesis of lumbar spine  M43.16 Ambulatory referral to Physical Medicine Rehab       Plan: Findings:  Chronic, worsening and severe bilateral lower back pain radiating to buttocks and hips. Patient continues to have severe pain despite good conservative therapies such as formal physical therapy, home exercise regimen, heating pad, rest and medications. I did discuss recent CT of lumbar spine using imaging and spine model. Patients clinical presentation and exam are consistent with neurogenic claudication as a result of spinal canal stenosis. There is moderate spinal canal stenosis at the level of L4-L5. Next step is to perform bilateral L4 transforaminal epidural steroid injection under fluoroscopic guidance. If good relief of pain with injection we can repeat this procedure infrequently as needed. I did discuss injection procedure with patient in detail today. We would consider interlaminar approach in the future, however we would need to discontinue Eliquis for injection. I encouraged patient to continue with physical therapy, we can always look at re-grouping in the future. He has tried Gabapentin in the past, if his pain persists we discussed medication management. He is willing to try other medications. Patient encouraged to remain active as tolerated. No red flag  symptoms noted upon exam today.     Meds & Orders: No orders of the defined types were placed in this encounter.   Orders Placed This Encounter  Procedures   Ambulatory referral to Physical Medicine Rehab    Follow-up: Return for Bilateral L4 transforaminal  epidural steroid injection.   Procedures: No procedures performed      Clinical History: EXAM: CT LUMBAR SPINE WITHOUT CONTRAST   TECHNIQUE: Multidetector CT imaging of the lumbar spine was performed without intravenous contrast administration. Multiplanar CT image reconstructions were also generated.   RADIATION DOSE REDUCTION: This exam was performed according to the departmental dose-optimization program which includes automated exposure control, adjustment of the mA and/or kV according to patient size and/or use of iterative reconstruction technique.   COMPARISON:  Lumbar spine series 10/30/2022   FINDINGS: Segmentation: 5 lumbar type vertebrae.   Alignment: Grade 1 anterolisthesis of L4 on L5 due to facet arthropathy.   Vertebrae: Vertebral body heights are maintained. There is mild-to-moderate spondylosis throughout the lumbar spine to include facet arthropathy.   Paraspinal and other soft tissues: Minimal calcified plaque of the abdominal aorta which is normal in caliber. No other significant soft tissue abnormality.   Disc levels: Moderate disc space narrowing at the L4-5 level and to lesser extent at the L2-3 level and L3-4 levels.   L1-2 level demonstrates no significant disc disease. No spinal canal stenosis or neural foraminal narrowing.   L2-3 level demonstrates a moderate broad-based disc bulge. No significant canal stenosis. Minimal bilateral neural foraminal narrowing.   L3-4 level demonstrates a moderate broad-based disc bulge. There is minimal canal stenosis due to disc disease, facet arthropathy and ligamentum flavum hypertrophy. Mild bilateral neural foraminal narrowing.   L4-5 level demonstrates a moderate broad-based disc bulge 2. There is moderate spinal canal stenosis due to disc disease, ligamentum flavum hypertrophy and facet arthropathy. Moderate bilateral neural foraminal narrowing right worse than left.   L5-S1 level demonstrates no  significant disc disease or spinal canal stenosis. No significant neural foraminal narrowing.   IMPRESSION: 1. No acute findings. 2. 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 3. 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 Moderate bilateral neural foraminal narrowing at the L4-5 level right worse than left. Minimal bilateral neural foraminal narrowing at L2-3 and L3-4. 4. Grade 1 anterolisthesis of L4 on L5 due to facet arthropathy.     Electronically Signed   By: Marin Olp M.D.   On: 01/04/2023 13:23   He reports that he has quit smoking. His smoking use included cigars. He has never used smokeless tobacco. No results for input(s): "HGBA1C", "LABURIC" in the last 8760 hours.  Objective:  VS:  HT:    WT:   BMI:     BP:   HR: bpm  TEMP: ( )  RESP:  Physical Exam Vitals and nursing note reviewed.  HENT:     Head: Normocephalic and atraumatic.     Right Ear: External ear normal.     Left Ear: External ear normal.     Nose: Nose normal.     Mouth/Throat:     Mouth: Mucous membranes are moist.  Eyes:     Extraocular Movements: Extraocular movements intact.  Cardiovascular:     Rate and Rhythm: Normal rate.     Pulses: Normal pulses.  Pulmonary:     Effort: Pulmonary effort is normal.  Abdominal:     General:  Abdomen is flat. There is no distension.  Musculoskeletal:        General: Tenderness present.     Cervical back: Normal range of motion.     Comments: Pt rises from seated position to standing without difficulty. Good lumbar range of motion. Strong distal strength without clonus, no pain upon palpation of greater trochanters. Sensation intact bilaterally. Walks independently, gait steady.   Skin:    General: Skin is warm and dry.     Capillary Refill: Capillary refill takes less than 2 seconds.  Neurological:     General: No focal deficit present.      Mental Status: He is alert and oriented to person, place, and time.  Psychiatric:        Mood and Affect: Mood normal.        Behavior: Behavior normal.     Ortho Exam  Imaging: No results found.  Past Medical/Family/Surgical/Social History: Medications & Allergies reviewed per EMR, new medications updated. 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   Atrial tachycardia 08/26/2020   Pacemaker - MDT 08/26/2020   Persistent atrial fibrillation ([place]) 12/22/2019   Past Medical History:  Diagnosis Date   Abnormal electrocardiogram    Abnormal heart sounds    Allergy 2020   upon arrival to [facility]   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   Chronic kidney disease 11/07/2021   Depression 2020   receiving appropriate therapy   Herniated lumbar intervertebral disc    Hypertension    Keratosis    benign   Left bundle branch block    Pacemaker    Pure hypercholesterolemia    Sick sinus syndrome (HCC)    Sleep apnea    VT (ventricular tachycardia) ([place])    Family History  Problem Relation Age of Onset   Arthritis Mother    Hypertension Mother    Hypertension Father    Past Surgical History:  Procedure Laterality Date   CARDIOVERSION N/A 09/28/2020   Procedure: CARDIOVERSION;  Surgeon: Nahser, Wonda Cheng, MD;  Location: [facility];  Service: Cardiovascular;  Laterality: N/A;   INSERT / REPLACE / REMOVE PACEMAKER     JOINT REPLACEMENT     partial right knee, medial side   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   Procedure: LEFT TOTAL HIP ARTHROPLASTY ANTERIOR APPROACH;  Surgeon: Mcarthur Rossetti, MD;  Location: [facility];  Service: Orthopedics;  Laterality:  Left;   VASECTOMY     Social History   Occupational History   Not on file  Tobacco Use   Smoking status: Former    Types: Cigars   Smokeless tobacco: Never   Tobacco comments:    Smoked cigars until the early 2000's  Vaping Use   Vaping Use: Never used  Substance and Sexual Activity   Alcohol use: Yes    Alcohol/week: 14.0 standard drinks of alcohol    Types: 7 Glasses of wine, 7 Shots of liquor per week    Comment: 1-2 cocktails every day and 1 glass of wine every day   Drug use: Never   Sexual activity: Not Currently

## 2023-01-09 NOTE — Therapy (Signed)
OUTPATIENT PHYSICAL THERAPY THORACOLUMBAR EVALUATION   Patient Name: Jimmy Palmer MRN: [id number] DOB:13-Jun-1941, 82 y.o., male Today's Date: 01/10/2023  END OF SESSION:  PT End of Session - 01/10/23 1229     Visit Number 11    Number of Visits 16    Date for PT Re-Evaluation 01/24/23    Authorization Type progress note and Kx at 10    PT Start Time [id number]    PT Stop Time 1314    PT Time Calculation (min) 44 min    Activity Tolerance Patient tolerated treatment well    Behavior During Therapy [facility] for tasks assessed/performed               Past Medical History:  Diagnosis Date   Abnormal electrocardiogram    Abnormal heart sounds    Allergy 2020   upon arrival to [facility]   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   Chronic kidney disease 11/07/2021   Depression 2020   receiving appropriate therapy   Herniated lumbar intervertebral disc    Hypertension    Keratosis    benign   Left bundle branch block    Pacemaker    Pure hypercholesterolemia    Sick sinus syndrome (HCC)    Sleep apnea    VT (ventricular tachycardia) ([place])    Past Surgical History:  Procedure Laterality Date   CARDIOVERSION N/A 09/28/2020   Procedure: CARDIOVERSION;  Surgeon: Acie Fredrickson Wonda Cheng, MD;  Location: [facility];  Service: Cardiovascular;  Laterality: N/A;   INSERT / REPLACE / REMOVE PACEMAKER     JOINT REPLACEMENT     partial right knee, medial side   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   Procedure: LEFT TOTAL HIP ARTHROPLASTY ANTERIOR APPROACH;  Surgeon: Mcarthur Rossetti, MD;  Location: [facility];  Service: Orthopedics;  Laterality: Left;   VASECTOMY     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   Rectal bleeding 02/01/2022   Left hip pain 02/01/2022   Environmental and seasonal allergies 02/01/2022   Hypertension 11/07/2021   Chronic kidney disease 11/07/2021   Heart block  AV complete ([place]) 08/26/2020   Atrial tachycardia 08/26/2020   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 Saw Dr Ernestina Patches and planning to do injection at L4-5 coming in from both sides.    PERTINENT HISTORY:  Arthritis, CKD, Herniated intervertebral discs, depression, Left BBB, THA left, VT  PAIN:  Are you having pain? Yes: NPRS scale: 6-8/10 at this time  Pain location: Lumbar spine Pain description: aching  Aggravating factors: Mornings  Relieving factors: Heat and voltarin  PRECAUTIONS: None  WEIGHT BEARING RESTRICTIONS: No  FALLS:  Has patient fallen in last 6 months? No  LIVING ENVIRONMENT: Lives with his wife  OCCUPATION: retired form the [employer]  PLOF: Independent  PATIENT GOALS: to have less pain    OBJECTIVE:   PALPATION: Spasming in the lumbar spine .   LUMBAR ROM:   AROM eval 2/12  Flexion Full    Extension Full    Right lateral flexion    Left lateral flexion    Right rotation Limited 25%  Full no pain  Left rotation Limited 25%  Full no pain   (Blank rows = not tested)  LOWER EXTREMITY ROM:     Passive  Right eval Left eval  Hip flexion Limit  No limit  Hip extension    Hip abduction    Hip adduction    Hip internal rotation Painful  Painful   Hip external rotation No limit No limit  Knee flexion    Knee extension    Ankle dorsiflexion    Ankle  plantarflexion    Ankle inversion    Ankle eversion     (Blank rows = not tested)  LOWER EXTREMITY MMT:    MMT Right eval Left eval Rt/Lt 2/12  Hip flexion 39.5 30 50.8/59.2  Hip extension     Hip abduction 49.7 41.7 38.7/33.7  Hip adduction     Hip internal rotation     Hip external rotation     Knee flexion     Knee extension 61.9 39.5 56.8/69.8  Ankle dorsiflexion     Ankle plantarflexion     Ankle inversion     Ankle eversion      (Blank rows = not tested)   GAIT: Lateral movement noted. Decreased hip flexion bilatera.   TODAY'S TREATMENT:                                                                                                                              DATE:   Treatment                            01/10/23:  Prone mobility to thoracic spine and rib cage Cable machine: lat pull, dead lift, mid height row in supination, straight arm pull down Leg press machine Slant board stretch, & heel raises Figure 4 stretch    Treatment  2/12:  MANUAL: STM & rib mobs to mid thoracic region Hooklying deep breathing LTR Hooklying marching Alt march + knee ext to ceiling   Treatment                            01/03/23:  MANUAL prone mob to lumbar lateral, thoracic central, bil ribs Side-lying clamshells and hip circles Thoracic extension stretch via shoulder flexion on the wall Reviewed home exercise program Cable machine dead lift, rows, shoulder extension Cybex leg press Cybex knee extension and knee flexion machine  PATIENT EDUCATION:  Education details: HEP, symptom management  Person educated: Patient Education method: Explanation, Demonstration, Tactile cues, Verbal cues, and Handouts Education comprehension: verbalized understanding, returned demonstration, verbal cues required, tactile cues required, and needs further education  HOME EXERCISE PROGRAM: Access Code: [email or web address] URL: [email or web address]  [email or web address] self correction   ASSESSMENT:  CLINICAL IMPRESSION: Return of some stiffness through thoracic spine was better than last time though.  Able to improve mobility quicker.  Reviewed gym program with machines with focus on opening the chest and keeping the core engaged for lumbar spine stability excellent tolerance to exercises with note that he could feel the glutes working but not concordant with any neurological symptoms.  OBJECTIVE IMPAIRMENTS: Abnormal gait, decreased activity tolerance, difficulty walking, decreased ROM, decreased strength, increased muscle spasms, and pain.   ACTIVITY LIMITATIONS: lifting, bending, sitting, standing, squatting, stairs, transfers, and locomotion level  PARTICIPATION LIMITATIONS: meal prep, cleaning, shopping, community activity, and yard work  PERSONAL FACTORS: Age and 1-2 comorbidities: left hip replacement  are also affecting patient's functional outcome.   REHAB POTENTIAL:  Good CLINICAL DECISION MAKING: Evolving/moderate complexity  EVALUATION COMPLEXITY: Low   GOALS: Goals reviewed with patient? Yes  SHORT TERM GOALS: Target date: 12/27/2022    Patient will demonstrate full lumbar rotation without pain Baseline: Goal status: Achieved  2.  Patient will report a 50% reduction in spasming in the morning Baseline:  Goal status: Achieved  3.  Patient will increase gross left lower extremity strength by 10 pounds Baseline:  Goal status: To be tested at next visit   LONG TERM GOALS: Target date: 01/24/2023    Patient will report complete resolution of spasming in the morning in order to improve his ability to perform ADLs Baseline:  Goal status: INITIAL  2.  Patient will have a complete exercise program including gym activity without increased pain. Baseline:  Goal status: INITIAL  3.  Left lower extremity strength will be within 5 pounds of right lower extremity strength to improve symmetry with gait Baseline:  Goal status:  INITIAL PLAN:  PT FREQUENCY: 2x/week  PT DURATION: 8 weeks  PLANNED INTERVENTIONS: Therapeutic exercises, Therapeutic activity, Neuromuscular re-education, Balance training, Gait training, Patient/Family education, Self Care, Joint mobilization, Joint manipulation, DME instructions, Aquatic Therapy, Electrical stimulation, Cryotherapy, Moist heat, Taping, Ultrasound, and Manual therapy.  PLAN FOR NEXT SESSION: cont flexion bias program  Lilyahna Sirmon C. Eunice Winecoff PT, DPT 01/10/23 1:25 PM

## 2023-01-09 NOTE — Progress Notes (Signed)
Functional Pain Scale - descriptive words and definitions  Moderate (4)   Constantly aware of pain, can complete ADLs with modification/sleep marginally affected at times/passive distraction is of no use, but active distraction gives some relief. Moderate range order  Average Pain 4    Spasms in the mornings after getting up. PT twice weekly has helped.

## 2023-01-10 ENCOUNTER — Encounter (HOSPITAL_BASED_OUTPATIENT_CLINIC_OR_DEPARTMENT_OTHER): Payer: Self-pay | Admitting: Physical Therapy

## 2023-01-10 ENCOUNTER — Ambulatory Visit (HOSPITAL_BASED_OUTPATIENT_CLINIC_OR_DEPARTMENT_OTHER): Payer: Medicare Other | Admitting: Physical Therapy

## 2023-01-10 DIAGNOSIS — R2689 Other abnormalities of gait and mobility: Secondary | ICD-10-CM | POA: Diagnosis not present

## 2023-01-10 DIAGNOSIS — M25552 Pain in left hip: Secondary | ICD-10-CM

## 2023-01-13 ENCOUNTER — Other Ambulatory Visit (HOSPITAL_BASED_OUTPATIENT_CLINIC_OR_DEPARTMENT_OTHER): Payer: Self-pay

## 2023-01-15 ENCOUNTER — Other Ambulatory Visit (HOSPITAL_BASED_OUTPATIENT_CLINIC_OR_DEPARTMENT_OTHER): Payer: Self-pay

## 2023-01-15 MED FILL — Losartan Potassium & Hydrochlorothiazide Tab 100-12.5 MG: ORAL | 30 days supply | Qty: 30 | Fill #0 | Status: AC

## 2023-01-16 ENCOUNTER — Encounter (HOSPITAL_BASED_OUTPATIENT_CLINIC_OR_DEPARTMENT_OTHER): Payer: Self-pay

## 2023-01-16 ENCOUNTER — Ambulatory Visit (INDEPENDENT_AMBULATORY_CARE_PROVIDER_SITE_OTHER): Payer: Medicare Other

## 2023-01-16 VITALS — BP 142/75 | HR 80 | Ht 69.0 in | Wt 174.3 lb

## 2023-01-16 DIAGNOSIS — Z0000 Encounter for general adult medical examination without abnormal findings: Secondary | ICD-10-CM

## 2023-01-16 NOTE — Patient Instructions (Addendum)
[name] , Thank you for taking time to come for your Medicare Wellness Visit. I appreciate your ongoing commitment to your health goals. Please review the following plan we discussed and let me know if I can assist you in the future.   These are the goals we discussed:  Goals       Patient stated (pt-stated)      I would like to get my back better and stop the pain.        This is a list of the screening recommended for you and due dates:  Health Maintenance  Topic Date Due   DTaP/Tdap/Td vaccine (1 - Tdap) Never done   COVID-19 Vaccine (3 - 2023-24 season) 02/01/2023*   Flu Shot  02/25/2023*   Zoster (Shingles) Vaccine (1 of 2) 04/16/2023*   Pneumonia Vaccine (1 of 1 - PCV) 01/17/2024*   Hepatitis C Screening: USPSTF Recommendation to screen - Ages 80-80 yo.  01/17/2024*   Medicare Annual Wellness Visit  01/17/2024   HPV Vaccine  Aged Out  *Topic was postponed. The date shown is not the original due date.    Advanced directives: In Chart  Conditions/risks identified: None  Next appointment: Follow up in one year for your annual wellness visit.    Preventive Care 80 Years and Older, Male  Preventive care refers to lifestyle choices and visits with your health care provider that can promote health and wellness. What does preventive care include? A yearly physical exam. This is also called an annual well check. Dental exams once or twice a year. Routine eye exams. Ask your health care provider how often you should have your eyes checked. Personal lifestyle choices, including: Daily care of your teeth and gums. Regular physical activity. Eating a healthy diet. Avoiding tobacco and drug use. Limiting alcohol use. Practicing safe sex. Taking low doses of aspirin every day. Taking vitamin and mineral supplements as recommended by your health care provider. What happens during an annual well check? The services and screenings done by your health care provider during your  annual well check will depend on your age, overall health, lifestyle risk factors, and family history of disease. Counseling  Your health care provider may ask you questions about your: Alcohol use. Tobacco use. Drug use. Emotional well-being. Home and relationship well-being. Sexual activity. Eating habits. History of falls. Memory and ability to understand (cognition). Work and work Statistician. Screening  You may have the following tests or measurements: Height, weight, and BMI. Blood pressure. Lipid and cholesterol levels. These may be checked every 5 years, or more frequently if you are over 5 years old. Skin check. Lung cancer screening. You may have this screening every year starting at age 36 if you have a 30-pack-year history of smoking and currently smoke or have quit within the past 15 years. Fecal occult blood test (FOBT) of the stool. You may have this test every year starting at age 80. Flexible sigmoidoscopy or colonoscopy. You may have a sigmoidoscopy every 5 years or a colonoscopy every 10 years starting at age 80. Prostate cancer screening. Recommendations will vary depending on your family history and other risks. Hepatitis C blood test. Hepatitis B blood test. Sexually transmitted disease (STD) testing. Diabetes screening. This is done by checking your blood sugar (glucose) after you have not eaten for a while (fasting). You may have this done every 1-3 years. Abdominal aortic aneurysm (AAA) screening. You may need this if you are a current or former smoker. Osteoporosis. You  may be screened starting at age 80 if you are at high risk. Talk with your health care provider about your test results, treatment options, and if necessary, the need for more tests. Vaccines  Your health care provider may recommend certain vaccines, such as: Influenza vaccine. This is recommended every year. Tetanus, diphtheria, and acellular pertussis (Tdap, Td) vaccine. You may need a Td  booster every 10 years. Zoster vaccine. You may need this after age 80. Pneumococcal 13-valent conjugate (PCV13) vaccine. One dose is recommended after age 80. Pneumococcal polysaccharide (PPSV23) vaccine. One dose is recommended after age 80. Talk to your health care provider about which screenings and vaccines you need and how often you need them. This information is not intended to replace advice given to you by your health care provider. Make sure you discuss any questions you have with your health care provider. Document Released: 12/10/2015 Document Revised: 08/02/2016 Document Reviewed: 09/14/2015 Elsevier Interactive Patient Education  2017 [place] Prevention in the Home Falls can cause injuries. They can happen to people of all ages. There are many things you can do to make your home safe and to help prevent falls. What can I do on the outside of my home? Regularly fix the edges of walkways and driveways and fix any cracks. Remove anything that might make you trip as you walk through a door, such as a raised step or threshold. Trim any bushes or trees on the path to your home. Use bright outdoor lighting. Clear any walking paths of anything that might make someone trip, such as rocks or tools. Regularly check to see if handrails are loose or broken. Make sure that both sides of any steps have handrails. Any raised decks and porches should have guardrails on the edges. Have any leaves, snow, or ice cleared regularly. Use sand or salt on walking paths during winter. Clean up any spills in your garage right away. This includes oil or grease spills. What can I do in the bathroom? Use night lights. Install grab bars by the toilet and in the tub and shower. Do not use towel bars as grab bars. Use non-skid mats or decals in the tub or shower. If you need to sit down in the shower, use a plastic, non-slip stool. Keep the floor dry. Clean up any water that spills on the floor  as soon as it happens. Remove soap buildup in the tub or shower regularly. Attach bath mats securely with double-sided non-slip rug tape. Do not have throw rugs and other things on the floor that can make you trip. What can I do in the bedroom? Use night lights. Make sure that you have a light by your bed that is easy to reach. Do not use any sheets or blankets that are too big for your bed. They should not hang down onto the floor. Have a firm chair that has side arms. You can use this for support while you get dressed. Do not have throw rugs and other things on the floor that can make you trip. What can I do in the kitchen? Clean up any spills right away. Avoid walking on wet floors. Keep items that you use a lot in easy-to-reach places. If you need to reach something above you, use a strong step stool that has a grab bar. Keep electrical cords out of the way. Do not use floor polish or wax that makes floors slippery. If you must use wax, use non-skid floor wax. Do  not have throw rugs and other things on the floor that can make you trip. What can I do with my stairs? Do not leave any items on the stairs. Make sure that there are handrails on both sides of the stairs and use them. Fix handrails that are broken or loose. Make sure that handrails are as long as the stairways. Check any carpeting to make sure that it is firmly attached to the stairs. Fix any carpet that is loose or worn. Avoid having throw rugs at the top or bottom of the stairs. If you do have throw rugs, attach them to the floor with carpet tape. Make sure that you have a light switch at the top of the stairs and the bottom of the stairs. If you do not have them, ask someone to add them for you. What else can I do to help prevent falls? Wear shoes that: Do not have high heels. Have rubber bottoms. Are comfortable and fit you well. Are closed at the toe. Do not wear sandals. If you use a stepladder: Make sure that it is  fully opened. Do not climb a closed stepladder. Make sure that both sides of the stepladder are locked into place. Ask someone to hold it for you, if possible. Clearly mark and make sure that you can see: Any grab bars or handrails. First and last steps. Where the edge of each step is. Use tools that help you move around (mobility aids) if they are needed. These include: Canes. Walkers. Scooters. Crutches. Turn on the lights when you go into a dark area. Replace any light bulbs as soon as they burn out. Set up your furniture so you have a clear path. Avoid moving your furniture around. If any of your floors are uneven, fix them. If there are any pets around you, be aware of where they are. Review your medicines with your doctor. Some medicines can make you feel dizzy. This can increase your chance of falling. Ask your doctor what other things that you can do to help prevent falls. This information is not intended to replace advice given to you by your health care provider. Make sure you discuss any questions you have with your health care provider. Document Released: 09/09/2009 Document Revised: 04/20/2016 Document Reviewed: 12/18/2014 Elsevier Interactive Patient Education  2017 [employer].

## 2023-01-16 NOTE — Progress Notes (Signed)
Subjective:   Jimmy Palmer is a 82 y.o. male who presents for Medicare Annual/Subsequent preventive examination.  Review of Systems     Cardiac Risk Factors include: advanced age (>49mn, >>23women);hypertension;male gender     Objective:    Today's Vitals   01/16/23 1358  BP: (!) 142/75  Pulse: 80  SpO2: 99%  Weight: 174 lb 4.8 oz (79.1 kg)  Height: 5' 9"$  (1.753 m)   Body mass index is 25.74 kg/m.     01/16/2023    2:19 PM 05/25/2022    2:49 PM 05/05/2022   12:00 PM 04/26/2022    1:11 PM 04/06/2022    3:24 PM 09/28/2020    1:46 PM  Advanced Directives  Does Patient Have a Medical Advance Directive? Yes Yes Yes Yes Yes No  Type of AParamedicof ADevineLiving will HA[place]Living will HM[place]enaLiving will  [place]Living will   Does patient want to make changes to medical advance directive? No - Patient declined No - Patient declined No - Patient declined  No - Patient declined   Copy of [place] Chart? Yes - validated most recent copy scanned in chart (See row information) No - copy requested No - copy requested  No - copy requested   Would patient like information on creating a medical advance directive?      No - Patient declined    Current Medications (verified) Outpatient Encounter Medications as of 01/16/2023  Medication Sig   apixaban (ELIQUIS) 5 MG TABS tablet Take 1 tablet (5 mg total) by mouth 2 (two) times daily.   buPROPion (WELLBUTRIN SR) 150 MG 12 hr tablet Take 1 tablet (150 mg total) by mouth every morning.   diclofenac Sodium (VOLTAREN) 1 % GEL Apply 1 application topically as needed (pain).    diphenhydrAMINE (BENADRYL) 25 mg capsule Take 25 mg by mouth at bedtime.    doxazosin (CARDURA) 4 MG tablet Take 1 tablet (4 mg total) by mouth daily.   losartan-hydrochlorothiazide (HYZAAR) 100-12.5 MG tablet Take 1 tablet by mouth daily.   Melatonin 5 MG TABS  Take 5 mg by mouth at bedtime.    methocarbamol (ROBAXIN) 500 MG tablet Take 1 tablet (500 mg total) by mouth 4 (four) times daily.   Multiple Vitamin (MULTIVITAMIN PO) Take 2 tablets by mouth once a week.   sertraline (ZOLOFT) 25 MG tablet Take 1 tablet (25 mg total) by mouth daily.   simvastatin (ZOCOR) 40 MG tablet Take 1 tablet (40 mg total) by mouth every evening.   No facility-administered encounter medications on file as of 01/16/2023.    Allergies (verified) Amoxicillin   History: Past Medical History:  Diagnosis Date   Abnormal electrocardiogram    Abnormal heart sounds    [facility]  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   Chronic kidney disease 11/07/2021   Depression 2020   receiving appropriate therapy   Herniated lumbar intervertebral disc    Hypertension    Keratosis    benign   Left bundle branch block    Pacemaker    Pure hypercholesterolemia    Sick sinus syndrome (HCC)    Sleep apnea    [place]cardia) [place]    Past Surgical History:  Procedure Laterality Date   CARDIOVERSION N/A 09/28/2020   Procedure: CARDIOVERSION;  Surgeon: NThayer Headings MD;  Location:  [facility];  Service: Cardiovascular;  Laterality: N/A;   INSERT / REPLACE / REMOVE PACEMAKER     JOINT REPLACEMENT     partial right knee, medial side   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   Procedure: LEFT TOTAL HIP ARTHROPLASTY ANTERIOR APPROACH;  Surgeon: Mcarthur Rossetti, MD;  Location: [facility];  Service: Orthopedics;  Laterality: Left;   VASECTOMY     Family History  Problem Relation Age of Onset   Arthritis Mother    Hypertension Mother    Hypertension Father    Social History   Socioeconomic History   Marital status: Married    Spouse name: Not on file   Number of children: 2   Years of education: Not on file   Highest education level: Not on file  Occupational History   Not on file  Tobacco Use   Smoking status: Former     Types: Cigars   Smokeless tobacco: Never   Tobacco comments:    Smoked cigars until the early 2000's  Vaping Use   Vaping Use: Never used  Substance and Sexual Activity   Alcohol use: Yes    Alcohol/week: 14.0 standard drinks of alcohol    Types: 7 Glasses of wine, 7 Shots of liquor per week    Comment: 1-2 cocktails every day and 1 glass of wine every day   Drug use: Never   Sexual activity: Not Currently  Other Topics Concern   Not on file  Social History Narrative   Retired Regulatory affairs officer   Recently relocated from [place] Strain: Low Risk  (01/16/2023)   Overall Financial Resource Strain (CARDIA)    Difficulty of Paying Living Expenses: Not hard at all  Food Insecurity: No Food Insecurity (01/16/2023)   Hunger Vital Sign    Worried About Running Out of Food in the Last Year: Never true    [place] in the Last Year: Never true  Transportation Needs: No Transportation Needs (01/16/2023)   PRAPARE - Hydrologist (Medical): No    Lack of Transportation (Non-Medical): No  Physical Activity: Sufficiently Active (01/16/2023)   Exercise Vital Sign    Days of Exercise per Week: 5 days    Minutes of Exercise per Session: 40 min  Stress: No Stress Concern Present (01/16/2023)   [place]    Feeling of Stress : Not at all  Social Connections: [place] (01/16/2023)   Social Connection and Isolation Panel [NHANES]    Frequency of Communication with Friends and Family: More than three times a week    Frequency of Social Gatherings with Friends and Family: More than three times a week    Attends Religious Services: More than 4 times per year    Active Member of [employer] or Organizations: Yes    Attends Music therapist: More than 4 times per year    Marital Status: Married    Tobacco Counseling Counseling  given: Not Answered Tobacco comments: Smoked cigars until the early 2000's   Clinical Intake:  Pre-visit preparation completed: No  Pain : No/denies pain     BMI - recorded: 25.74 Nutritional Status: BMI 25 -29 Overweight Nutritional Risks: None Diabetes: No  How often do you need to have someone help you when you read instructions, pamphlets, or other written materials from your doctor or pharmacy?: 1 - Never  Diabetic?  No  Interpreter Needed?: No  Information entered by :: Rolene Arbour LPN   Activities of Daily Living    01/16/2023    2:14 PM 05/05/2022   12:00 PM  In your present state of health, do you have any difficulty performing the following activities:  Hearing? 0 0  Vision? 0 0  Difficulty concentrating or making decisions? 0 0  Walking or climbing stairs? 0 1  Dressing or bathing? 0 0  Doing errands, shopping? 0 0  Preparing Food and eating ? N   Using the Toilet? N   In the past six months, have you accidently leaked urine? N   Do you have problems with loss of bowel control? N   Managing your Medications? N   Managing your Finances? N   Housekeeping or managing your Housekeeping? N     Patient Care Team: [name], Blondell Reveal, MD as PCP - General (Family Medicine) Deboraha Sprang, MD as PCP - Cardiology (Cardiology)  Indicate any recent Medical Services you may have received from other than [facility] providers in the past year (date may be approximate).     Assessment:   This is a routine wellness examination for Randol.  Hearing/Vision screen Hearing Screening - Comments:: Denies hearing difficulties    Dietary issues and exercise activities discussed: Current Exercise Habits: Home exercise routine, Type of exercise: walking, Time (Minutes): 40, Frequency (Times/Week): 4, Weekly Exercise (Minutes/Week): 160, Intensity: Moderate, Exercise limited by: None identified   Goals Addressed               This Visit's Progress     Patient stated  (pt-stated)        I would like to get my back better and stop the pain.       Depression Screen    01/16/2023    1:58 PM 01/16/2023    1:57 PM 02/01/2022    8:10 AM  PHQ 2/9 Scores  PHQ - 2 Score 0 0 1    Fall Risk    01/16/2023    2:16 PM  Fall Risk   Falls in the past year? 0  Number falls in past yr: 0  Injury with Fall? 0  Risk for fall due to : No Fall Risks  Follow up Falls prevention discussed    [place]:  Any stairs in or around the home? Yes  If so, are there any without handrails? No  Home free of loose throw rugs in walkways, pet beds, electrical cords, etc? Yes  Adequate  lighting in your home to reduce risk of falls? Yes   ASSISTIVE DEVICES UTILIZED TO PREVENT FALLS:  Life alert? No  Use of a cane, walker or w/c? No  Grab bars in the bathroom? No  Shower chair or bench in shower? Yes  Elevated toilet seat or a handicapped toilet? No   TIMED UP AND GO:  Was the test performed? Yes .  Length of time to ambulate 10 feet: 10 sec.   Gait steady and fast without use of assistive device  Cognitive Function:        01/16/2023    2:19 PM  6CIT Screen  What Year? 0 points  What month? 0 points  What time? 0 points  Count back from 20 0 points  Months in reverse 0 points  Repeat phrase 0 points  Total Score 0 points    Immunizations Immunization History  Administered Date(s) Administered   PFIZER(Purple  Top)SARS-COV-2 Vaccination 01/18/2020, 02/11/2020    TDAP status: Due, Education has been provided regarding the importance of this vaccine. Advised may receive this vaccine at local pharmacy or Health Dept. Aware to provide a copy of the vaccination record if obtained from local pharmacy or Health Dept. Verbalized acceptance and understanding.  Flu Vaccine status: Due, Education has been provided regarding the importance of this vaccine. Advised may receive this vaccine at local pharmacy or Health Dept. Aware  to provide a copy of the vaccination record if obtained from local pharmacy or Health Dept. Verbalized acceptance and understanding.  Pneumococcal vaccine status: Due, Education has been provided regarding the importance of this vaccine. Advised may receive this vaccine at local pharmacy or Health Dept. Aware to provide a copy of the vaccination record if obtained from local pharmacy or Health Dept. Verbalized acceptance and understanding.  Covid-19 vaccine status: Completed vaccines  Qualifies for Shingles Vaccine? Yes   Zostavax completed No   Shingrix Completed?: No.    Education has been provided regarding the importance of this vaccine. Patient has been advised to call insurance company to determine out of pocket expense if they have not yet received this vaccine. Advised may also receive vaccine at local pharmacy or Health Dept. Verbalized acceptance and understanding.  Screening Tests Health Maintenance  Topic Date Due   DTaP/Tdap/Td (1 - Tdap) Never done   COVID-19 Vaccine (3 - 2023-24 season) 02/01/2023 (Originally 07/28/2022)   INFLUENZA VACCINE  02/25/2023 (Originally 06/27/2022)   Zoster Vaccines- Shingrix (1 of 2) 04/16/2023 (Originally 02/23/1993)   Pneumonia Vaccine 79+ Years old (1 of 1 - PCV) 01/17/2024 (Originally 02/24/2008)   Hepatitis C Screening  01/17/2024 (Originally 02/23/1959)   Medicare Annual Wellness (AWV)  01/17/2024   HPV VACCINES  Aged Out    Health Maintenance  Health Maintenance Due  Topic Date Due   DTaP/Tdap/Td (1 - Tdap) Never done    Colorectal cancer screening: No longer required.   Lung Cancer Screening: (Low Dose CT Chest recommended if Age 83-80 years, 30 pack-year currently smoking OR have quit w/in 15years.) does not qualify.     Additional Screening:  Hepatitis C Screening: does qualify; Deferred  Vision Screening: Recommended annual ophthalmology exams for early detection of glaucoma and other disorders of the eye. Is the patient up to  date with their annual eye exam?  Yes  Who is the provider or what is the name of the office in which the patient attends annual eye exams? [facility] If pt is not established with a provider, would they like to be referred to a provider to establish care? No .   Dental Screening: Recommended annual dental exams for proper oral hygiene  Community Resource Referral / Chronic Care Management:  CRR required this visit?  No   CCM required this visit?  No      Plan:     I have personally reviewed and noted the following in the patient's chart:   Medical and social history Use of alcohol, tobacco or illicit drugs  Current medications and supplements including opioid prescriptions. Patient is not currently taking opioid prescriptions. Functional ability and status Nutritional status Physical activity Advanced directives List of other physicians Hospitalizations, surgeries, and ER visits in previous 12 months Vitals Screenings to include cognitive, depression, and falls Referrals and appointments  In addition, I have reviewed and discussed with patient certain preventive protocols, quality metrics, and best practice recommendations. A written personalized care plan for preventive services as well  as general preventive health recommendations were provided to patient.     Criselda Peaches, LPN   [id number]   Nurse Notes:  Patient due Hep-C Screening. Patient request f/u with question concerning recommendation for medication Preva gin.

## 2023-01-17 ENCOUNTER — Ambulatory Visit (HOSPITAL_BASED_OUTPATIENT_CLINIC_OR_DEPARTMENT_OTHER): Payer: Medicare Other | Admitting: Physical Therapy

## 2023-01-17 ENCOUNTER — Encounter (HOSPITAL_BASED_OUTPATIENT_CLINIC_OR_DEPARTMENT_OTHER): Payer: Self-pay | Admitting: Physical Therapy

## 2023-01-17 DIAGNOSIS — R2689 Other abnormalities of gait and mobility: Secondary | ICD-10-CM | POA: Diagnosis not present

## 2023-01-17 DIAGNOSIS — Z96642 Presence of left artificial hip joint: Secondary | ICD-10-CM

## 2023-01-17 DIAGNOSIS — M25652 Stiffness of left hip, not elsewhere classified: Secondary | ICD-10-CM

## 2023-01-17 DIAGNOSIS — M25552 Pain in left hip: Secondary | ICD-10-CM

## 2023-01-17 NOTE — Therapy (Signed)
OUTPATIENT PHYSICAL THERAPY THORACOLUMBAR EVALUATION   Patient Name: Jimmy Palmer MRN: [id number] DOB:[birth year], 82 y.o., male Today's Date: 01/10/2023  END OF SESSION:  PT End of Session - 01/10/23 1229     Visit Number 11    Number of Visits 16    Date for PT Re-Evaluation 01/24/23    Authorization Type progress note and Kx at 10    PT Start Time [id number]    PT Stop Time 1314    PT Time Calculation (min) 44 min    Activity Tolerance Patient tolerated treatment well    Behavior During Therapy [facility] for tasks assessed/performed               Past Medical History:  Diagnosis Date   Abnormal electrocardiogram    Abnormal heart sounds    Allergy 2020   upon arrival to [facility]   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   Chronic kidney disease 11/07/2021   Depression 2020   receiving appropriate therapy   Herniated lumbar intervertebral disc    Hypertension    Keratosis    benign   Left bundle branch block    Pacemaker    Pure hypercholesterolemia    Sick sinus syndrome (HCC)    Sleep apnea    VT (ventricular tachycardia) ([place])    Past Surgical History:  Procedure Laterality Date   CARDIOVERSION N/A 09/28/2020   Procedure: CARDIOVERSION;  Surgeon: Acie Fredrickson Wonda Cheng, MD;  Location: [place];  Service: Cardiovascular;  Laterality: N/A;   INSERT / REPLACE / REMOVE PACEMAKER     JOINT REPLACEMENT     partial right knee, medial side   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   Procedure: LEFT TOTAL HIP ARTHROPLASTY ANTERIOR APPROACH;  Surgeon: Mcarthur Rossetti, MD;  Location[facility];  Service: Orthopedics;  Laterality: Left;   VASECTOMY     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   Rectal bleeding 02/01/2022   Left hip pain 02/01/2022   Environmental and seasonal allergies 02/01/2022   Hypertension 11/07/2021   Chronic kidney disease 11/07/2021   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   PERTINENT HISTORY:  Arthritis, CKD, Herniated intervertebral discs, depression, Left BBB, THA left, VT  PAIN:  Are you having pain? Yes: NPRS scale: 6-8/10 at this time  Pain location: Lumbar spine Pain description: aching  Aggravating factors: Mornings  Relieving factors: Heat and voltarin  PRECAUTIONS: None  WEIGHT BEARING RESTRICTIONS: No  FALLS:  Has patient fallen in last 6 months? No  LIVING ENVIRONMENT: Lives with his wife  OCCUPATION: retired form the [employer]  PLOF: Independent  PATIENT GOALS: to have less pain    OBJECTIVE:   PALPATION: Spasming in the lumbar spine .   LUMBAR ROM:   AROM eval 2/12  Flexion Full    Extension Full    Right lateral flexion    Left lateral flexion    Right rotation Limited 25%  Full no pain  Left rotation Limited 25%  Full no pain   (Blank rows = not tested)  LOWER EXTREMITY ROM:     Passive  Right eval Left eval  Hip flexion Limit  No limit  Hip extension    Hip abduction    Hip adduction    Hip internal rotation Painful  Painful   Hip external rotation No limit No limit  Knee flexion    Knee extension    Ankle dorsiflexion    Ankle plantarflexion    Ankle inversion    Ankle eversion     (Blank rows = not tested)  LOWER EXTREMITY  MMT:    MMT Right eval Left eval Rt/Lt 2/12  Hip flexion 39.5 30 50.8/59.2  Hip extension     Hip abduction 49.7 41.7 38.7/33.7  Hip adduction     Hip internal rotation     Hip external rotation     Knee flexion     Knee extension 61.9 39.5 56.8/69.8  Ankle dorsiflexion     Ankle plantarflexion     Ankle inversion     Ankle eversion      (Blank rows = not tested)   GAIT: Lateral movement noted. Decreased hip flexion bilatera.   TODAY'S TREATMENT:                                                                                                                              DATE:  Treatment                            01/17/23:  - Manual: trigger point release to bilateral gluteals - PA mobilization to lumbar spine and gross throacic mobilization   - Leg press machine 2x15 25lbs  - row machine 2x15 25 lbs  - knee extension 2x15 10 lbs   Wrote patient up a sheet for him to follow at the gym.     Treatment  01/10/23:  Prone mobility to thoracic spine and rib cage Cable machine: lat pull, dead lift, mid height row in supination, straight arm pull down Leg press machine Slant board stretch, & heel raises Figure 4 stretch    Treatment                            2/12:  MANUAL: STM & rib mobs to mid thoracic region Hooklying deep breathing LTR Hooklying marching Alt march + knee ext to ceiling   Treatment                            01/03/23:  MANUAL prone mob to lumbar lateral, thoracic central, bil ribs Side-lying clamshells and hip circles Thoracic extension stretch via shoulder flexion on the wall Reviewed home exercise program Cable machine dead lift, rows, shoulder extension Cybex leg press Cybex knee extension and knee flexion machine  PATIENT EDUCATION:  Education details: HEP, symptom management  Person educated: Patient Education method: Explanation, Demonstration, Tactile cues, Verbal cues, and Handouts Education  comprehension: verbalized understanding, returned demonstration, verbal cues required, tactile cues required, and needs further education  HOME EXERCISE PROGRAM: Access Code: [email or web address] URL: [email or web address] self correction   ASSESSMENT:  CLINICAL IMPRESSION: The patient is making good progress. He continues to have a significant trigger point in his right gluteal. We continue to perform manual therapy to this area. He reports that works better then the needling. We continue to review a gym program with him. We started writing out a gym sheet for him. He will begin coming to the gym some. He will be having an epidural soon.  OBJECTIVE IMPAIRMENTS: Abnormal gait, decreased activity tolerance, difficulty walking, decreased ROM, decreased strength, increased muscle spasms, and pain.   ACTIVITY LIMITATIONS: lifting, bending, sitting, standing, squatting, stairs, transfers, and locomotion level  PARTICIPATION LIMITATIONS: meal prep, cleaning, shopping, community activity, and yard work  PERSONAL FACTORS: Age and 1-2 comorbidities: left hip replacement  are also affecting patient's functional outcome.   REHAB POTENTIAL:  Good CLINICAL DECISION MAKING: Evolving/moderate complexity  EVALUATION COMPLEXITY: Low   GOALS: Goals reviewed with patient? Yes  SHORT TERM GOALS: Target date: 12/27/2022    Patient will demonstrate full lumbar rotation without pain Baseline: Goal status: Achieved  2.  Patient will report a 50% reduction in spasming in the morning Baseline:  Goal status: Achieved  3.  Patient will increase gross left lower extremity strength by 10 pounds Baseline:  Goal status: To be tested at next visit   LONG TERM GOALS: Target date: 01/24/2023    Patient will report complete resolution of spasming in the morning in order to improve his ability to perform ADLs Baseline:  Goal status: INITIAL  2.  Patient will have a complete exercise  program including gym activity without increased pain. Baseline:  Goal status: INITIAL  3.  Left lower extremity strength will be within 5 pounds of right lower extremity strength to improve symmetry with gait Baseline:  Goal status: INITIAL PLAN:  PT FREQUENCY: 2x/week  PT DURATION: 8 weeks  PLANNED INTERVENTIONS: Therapeutic exercises, Therapeutic activity, Neuromuscular re-education, Balance training, Gait training, Patient/Family education, Self Care, Joint mobilization, Joint manipulation, DME instructions, Aquatic Therapy, Electrical stimulation, Cryotherapy, Moist heat, Taping, Ultrasound, and Manual therapy.  PLAN FOR NEXT SESSION: cont flexion bias program  Carolyne Littles PT DPT  01/10/23 1:25 PM

## 2023-01-19 ENCOUNTER — Ambulatory Visit (HOSPITAL_BASED_OUTPATIENT_CLINIC_OR_DEPARTMENT_OTHER): Payer: Medicare Other | Admitting: Physical Therapy

## 2023-01-19 ENCOUNTER — Encounter (HOSPITAL_BASED_OUTPATIENT_CLINIC_OR_DEPARTMENT_OTHER): Payer: Self-pay | Admitting: Physical Therapy

## 2023-01-19 DIAGNOSIS — R2689 Other abnormalities of gait and mobility: Secondary | ICD-10-CM

## 2023-01-19 DIAGNOSIS — M25552 Pain in left hip: Secondary | ICD-10-CM

## 2023-01-19 NOTE — Therapy (Signed)
OUTPATIENT PHYSICAL THERAPY THORACOLUMBAR EVALUATION   Patient Name: Jimmy Palmer MRN: [id number] DOB:1941/10/27, 82 y.o., male Today's Date: 01/19/2023  END OF SESSION:  PT End of Session - 01/19/23 1312     Visit Number 13    Number of Visits 16    Date for PT Re-Evaluation 01/24/23    Authorization Type progress note and Kx at 10    PT Start Time 1310    PT Stop Time 1400    PT Time Calculation (min) 50 min    Activity Tolerance Patient tolerated treatment well    Behavior During Therapy [facility] for tasks assessed/performed               Past Medical History:  Diagnosis Date   Abnormal electrocardiogram    Abnormal heart sounds    Allergy 2020   upon arrival to [facility]   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   Chronic kidney disease 11/07/2021   Depression 2020   receiving appropriate therapy   Herniated lumbar intervertebral disc    Hypertension    Keratosis    benign   Left bundle branch block    Pacemaker    Pure hypercholesterolemia    Sick sinus syndrome (HCC)    Sleep apnea    VT (ventricular tachycardia) ([place])    Past Surgical History:  Procedure Laterality Date   CARDIOVERSION N/A 09/28/2020   Procedure: CARDIOVERSION;  Surgeon: Acie Fredrickson Wonda Cheng, MD;  Location: [place];  Service: Cardiovascular;  Laterality: N/A;   INSERT / REPLACE / REMOVE PACEMAKER     JOINT REPLACEMENT     partial right knee, medial side   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   Procedure: LEFT TOTAL HIP ARTHROPLASTY ANTERIOR APPROACH;  Surgeon: Mcarthur Rossetti, MD;  [facility];  Service: Orthopedics;  Laterality: Left;   VASECTOMY     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   Left hip pain 02/01/2022   Environmental and seasonal allergies 02/01/2022   Hypertension 11/07/2021   Chronic kidney disease 11/07/2021   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    Atrial tachycardia 08/26/2020   Pacemaker - MDT 08/26/2020    PCP: Dr Raymond [name]   REFERRING PROVIDER: Dr Vanetta Mulders   REFERRING DIAG:  Diagnosis  M43.16 (ICD-10-CM) - Spondylolisthesis, lumbar region    Rationale for Evaluation and Treatment: Rehabilitation  THERAPY DIAG:  Other abnormalities of gait and mobility  Pain in left hip  ONSET DATE:   SUBJECTIVE:                                                                                                                                                   SUBJECTIVE  STATEMENT: The spasming in the morning doesn't stop me like it used to.   PERTINENT HISTORY:  Arthritis, CKD, Herniated intervertebral discs, depression, Left BBB, THA left, VT  PAIN:  Are you having pain? Yes: NPRS scale: 6-8/10 at this time  Pain location: Lumbar spine Pain description: aching  Aggravating factors: Mornings  Relieving factors: Heat and voltarin  PRECAUTIONS: None  WEIGHT BEARING RESTRICTIONS: No  FALLS:  Has patient fallen in last 6 months? No  LIVING ENVIRONMENT: Lives with his wife  OCCUPATION: retired form the [employer]  PLOF: Independent  PATIENT GOALS: to have less pain    OBJECTIVE:   PALPATION: Spasming in the lumbar spine .   LUMBAR ROM:   AROM eval 2/12  Flexion Full    Extension Full    Right lateral flexion    Left lateral flexion    Right rotation Limited 25%  Full no pain  Left rotation Limited 25%  Full no pain   (Blank rows = not tested)  LOWER EXTREMITY ROM:     Passive  Right eval Left eval  Hip flexion Limit  No limit  Hip extension    Hip abduction    Hip adduction    Hip internal rotation Painful  Painful   Hip external rotation No limit No limit  Knee flexion    Knee extension    Ankle dorsiflexion    Ankle plantarflexion    Ankle inversion    Ankle eversion     (Blank rows = not tested)  LOWER EXTREMITY MMT:    MMT Right eval Left eval Rt/Lt 2/12  Hip flexion 39.5 30  50.8/59.2  Hip extension     Hip abduction 49.7 41.7 38.7/33.7  Hip adduction     Hip internal rotation     Hip external rotation     Knee flexion     Knee extension 61.9 39.5 56.8/69.8  Ankle dorsiflexion     Ankle plantarflexion     Ankle inversion     Ankle eversion      (Blank rows = not tested)   GAIT: Lateral movement noted. Decreased hip flexion bilatera.   TODAY'S TREATMENT:                                                                                                                              DATE:  Treatment                            2/23:  Prone rib mobilizations Gym weight machines- written on [employer] paper   Treatment                            01/17/23:  - Manual: trigger point release to bilateral gluteals - PA mobilization to lumbar spine and gross throacic mobilization   - Leg press machine 2x15 25lbs  - row machine  2x15 25 lbs  - knee extension 2x15 10 lbs   Wrote patient up a sheet for him to follow at the gym.     Treatment                            01/10/23:  Prone mobility to thoracic spine and rib cage Cable machine: lat pull, dead lift, mid height row in supination, straight arm pull down Leg press machine Slant board stretch, & heel raises Figure 4 stretch    PATIENT EDUCATION:  Education details: HEP, symptom management  Person educated: Patient Education method: Explanation, Demonstration, Tactile cues, Verbal cues, and Handouts Education comprehension: verbalized understanding, returned demonstration, verbal cues required, tactile cues required, and needs further education  HOME EXERCISE PROGRAM: Access Code: [email or web address] URL: [email or web address] self correction   ASSESSMENT:  CLINICAL IMPRESSION: Excellent tolerance to machine challenges today and expanded options to provide a variety in his workouts. Encouraged him to sign up for fitness assessment as well as utilize fitness specialists for  questions while he is in the gym.   OBJECTIVE IMPAIRMENTS: Abnormal gait, decreased activity tolerance, difficulty walking, decreased ROM, decreased strength, increased muscle spasms, and pain.   ACTIVITY LIMITATIONS: lifting, bending, sitting, standing, squatting, stairs, transfers, and locomotion level  PARTICIPATION LIMITATIONS: meal prep, cleaning, shopping, community activity, and yard work  PERSONAL FACTORS: Age and 1-2 comorbidities: left hip replacement  are also affecting patient's functional outcome.   REHAB POTENTIAL:  Good CLINICAL DECISION MAKING: Evolving/moderate complexity  EVALUATION COMPLEXITY: Low   GOALS: Goals reviewed with patient? Yes  SHORT TERM GOALS: Target date: 12/27/2022    Patient will demonstrate full lumbar rotation without pain Baseline: Goal status: Achieved  2.  Patient will report a 50% reduction in spasming in the morning Baseline:  Goal status: Achieved  3.  Patient will increase gross left lower extremity strength by 10 pounds Baseline:  Goal status: To be tested at next visit   LONG TERM GOALS: Target date: 01/24/2023    Patient will report complete resolution of spasming in the morning in order to improve his ability to perform ADLs Baseline:  Goal status: INITIAL  2.  Patient will have a complete exercise program including gym activity without increased pain. Baseline:  Goal status: INITIAL  3.  Left lower extremity strength will be within 5 pounds of right lower extremity strength to improve symmetry with gait Baseline:  Goal status: INITIAL PLAN:  PT FREQUENCY: 2x/week  PT DURATION: 8 weeks  PLANNED INTERVENTIONS: Therapeutic exercises, Therapeutic activity, Neuromuscular re-education, Balance training, Gait training, Patient/Family education, Self Care, Joint mobilization, Joint manipulation, DME instructions, Aquatic Therapy, Electrical stimulation, Cryotherapy, Moist heat, Taping, Ultrasound, and Manual  therapy.  PLAN FOR NEXT SESSION: cont transition to gym program  Aimee Heldman C. Rielly Brunn PT, DPT 01/19/23 2:21 PM

## 2023-01-22 ENCOUNTER — Encounter (HOSPITAL_BASED_OUTPATIENT_CLINIC_OR_DEPARTMENT_OTHER): Payer: Self-pay | Admitting: Physical Therapy

## 2023-01-22 ENCOUNTER — Ambulatory Visit (HOSPITAL_BASED_OUTPATIENT_CLINIC_OR_DEPARTMENT_OTHER): Payer: Medicare Other | Admitting: Physical Therapy

## 2023-01-22 DIAGNOSIS — R2689 Other abnormalities of gait and mobility: Secondary | ICD-10-CM | POA: Diagnosis not present

## 2023-01-22 DIAGNOSIS — M25552 Pain in left hip: Secondary | ICD-10-CM

## 2023-01-22 NOTE — Therapy (Signed)
OUTPATIENT PHYSICAL THERAPY THORACOLUMBAR EVALUATION   Patient Name: Jimmy Palmer MRN: [id number] DOB:November 14, 1941, 82 y.o., male Today's Date: 01/22/2023  END OF SESSION:  PT End of Session - 01/22/23 1517     Visit Number 14    Number of Visits 16    Date for PT Re-Evaluation 01/24/23    Authorization Type progress note and Kx at 10    PT Start Time [id number]    PT Stop Time 1552    PT Time Calculation (min) 35 min    Activity Tolerance Patient tolerated treatment well    Behavior During Therapy [facility] for tasks assessed/performed               Past Medical History:  Diagnosis Date   Abnormal electrocardiogram    Abnormal heart sounds    Allergy 2020   upon arrival to [facility]   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   Chronic kidney disease 11/07/2021   Depression 2020   receiving appropriate therapy   Herniated lumbar intervertebral disc    Hypertension    Keratosis    benign   Left bundle branch block    Pacemaker    Pure hypercholesterolemia    Sick sinus syndrome (HCC)    Sleep apnea    VT (ventricular tachycardia) ([place])    Past Surgical History:  Procedure Laterality Date   CARDIOVERSION N/A 09/28/2020   Procedure: CARDIOVERSION;  Surgeon: Acie Fredrickson Wonda Cheng, MD;  Location: [place];  Service: Cardiovascular;  Laterality: N/A;   INSERT / REPLACE / REMOVE PACEMAKER     JOINT REPLACEMENT     partial right knee, medial side   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   Procedure: LEFT TOTAL HIP ARTHROPLASTY ANTERIOR APPROACH;  Surgeon: Mcarthur Rossetti, MD;  [facility];  Service: Orthopedics;  Laterality: Left;   VASECTOMY     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   Left hip pain 02/01/2022   Environmental and seasonal allergies 02/01/2022   Hypertension 11/07/2021   Chronic kidney disease 11/07/2021   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 Felt like the rib mobilizations were helpful.   PERTINENT HISTORY:  Arthritis, CKD, Herniated intervertebral discs, depression, Left BBB, THA left, VT  PAIN:  Are you having pain? Yes: NPRS scale: 6-8/10 at this time  Pain location: Lumbar spine Pain description: aching  Aggravating factors: Mornings  Relieving factors: Heat and voltarin  PRECAUTIONS: None  WEIGHT BEARING RESTRICTIONS: No  FALLS:  Has patient fallen in last 6 months? No  LIVING ENVIRONMENT: Lives with his wife  OCCUPATION: retired form the [employer]  PLOF: Independent  PATIENT GOALS: to have less pain    OBJECTIVE:   PALPATION: Spasming in the lumbar spine .   LUMBAR ROM:   AROM eval 2/12  Flexion Full    Extension Full    Right lateral flexion    Left lateral flexion    Right rotation Limited 25%  Full no pain  Left rotation Limited 25%  Full no pain   (Blank rows = not tested)  LOWER EXTREMITY ROM:     Passive  Right eval Left eval  Hip flexion Limit  No limit  Hip extension    Hip abduction    Hip adduction    Hip internal rotation Painful  Painful   Hip external rotation No limit No limit  Knee flexion    Knee extension    Ankle dorsiflexion    Ankle plantarflexion    Ankle inversion    Ankle eversion     (Blank rows = not tested)  LOWER EXTREMITY MMT:    MMT Right eval Left eval Rt/Lt 2/12  Hip flexion 39.5 30  50.8/59.2  Hip extension     Hip abduction 49.7 41.7 38.7/33.7  Hip adduction     Hip internal rotation     Hip external rotation     Knee flexion     Knee extension 61.9 39.5 56.8/69.8  Ankle dorsiflexion     Ankle plantarflexion     Ankle inversion     Ankle eversion      (Blank rows = not tested)   GAIT: Lateral movement noted. Decreased hip flexion bilatera.   TODAY'S TREATMENT:                                                                                                                              DATE:  Treatment                            2/26:  Cable seated row 12.5 Triceps pull down cable with bar rather than ropes Gastroc stretch on slant board Balance on airex- marching, SLS Cybex leg press 55lb Xride 5 min   Treatment                            2/23:  Prone rib mobilizations Gym weight machines- written on [employer] paper  Treatment                            01/17/23:  - Manual: trigger point release to bilateral gluteals - PA mobilization to lumbar spine and gross throacic mobilization   - Leg press machine 2x15 25lbs  - row machine 2x15 25 lbs  - knee extension 2x15 10 lbs   Wrote patient up a sheet for him to follow at the gym.     Treatment                            01/10/23:  Prone mobility to thoracic spine and rib cage Cable machine: lat pull, dead lift, mid height row in supination, straight arm pull down Leg press machine Slant board stretch, & heel raises Figure 4 stretch    PATIENT EDUCATION:  Education details: HEP, symptom management  Person educated: Patient Education method: Explanation, Demonstration, Tactile cues, Verbal cues, and Handouts Education comprehension: verbalized understanding, returned demonstration, verbal cues required, tactile cues required, and needs further education  HOME EXERCISE PROGRAM: Access Code: [email or web address] URL: [email or web address] self  correction   ASSESSMENT:  CLINICAL IMPRESSION: Continued review of gym program and discussed the importance of variety and also continuing stretches.   OBJECTIVE IMPAIRMENTS: Abnormal gait, decreased activity tolerance, difficulty walking, decreased ROM, decreased strength, increased muscle spasms, and pain.   ACTIVITY LIMITATIONS: lifting, bending, sitting, standing, squatting, stairs, transfers, and locomotion level  PARTICIPATION LIMITATIONS: meal prep, cleaning, shopping, community activity, and yard work  PERSONAL FACTORS: Age and 1-2 comorbidities: left hip replacement  are also affecting patient's functional outcome.   REHAB POTENTIAL:  Good CLINICAL DECISION MAKING: Evolving/moderate complexity  EVALUATION COMPLEXITY: Low   GOALS: Goals reviewed with patient? Yes  SHORT TERM GOALS: Target date: 12/27/2022    Patient will demonstrate full lumbar rotation without pain Baseline: Goal status: Achieved  2.  Patient will report a 50% reduction in spasming in the morning Baseline:  Goal status: Achieved  3.  Patient will increase gross left lower extremity strength by 10 pounds Baseline:  Goal status: To be tested at next visit   LONG TERM GOALS: Target date: 01/24/2023    Patient will report complete resolution of spasming in the morning in order to improve his ability to perform ADLs Baseline:  Goal status: INITIAL  2.  Patient will have a complete exercise program including gym activity without increased pain. Baseline:  Goal status: INITIAL  3.  Left lower extremity strength will be within 5 pounds of right lower extremity strength to improve symmetry with gait Baseline:  Goal status: INITIAL PLAN:  PT FREQUENCY: 2x/week  PT DURATION: 8 weeks  PLANNED INTERVENTIONS: Therapeutic exercises, Therapeutic activity, Neuromuscular re-education, Balance training, Gait training, Patient/Family education, Self Care, Joint mobilization, Joint manipulation, DME  instructions, Aquatic Therapy, Electrical stimulation, Cryotherapy, Moist heat, Taping, Ultrasound, and Manual therapy.  PLAN FOR NEXT SESSION: cont transition to gym program  Yacine Garriga C. Thersea Manfredonia PT, DPT 01/22/23 3:56 PM

## 2023-01-23 ENCOUNTER — Ambulatory Visit: Payer: Self-pay

## 2023-01-23 ENCOUNTER — Ambulatory Visit (INDEPENDENT_AMBULATORY_CARE_PROVIDER_SITE_OTHER): Payer: Medicare Other | Admitting: Physical Medicine and Rehabilitation

## 2023-01-23 VITALS — BP 156/83 | HR 81

## 2023-01-23 DIAGNOSIS — M5416 Radiculopathy, lumbar region: Secondary | ICD-10-CM

## 2023-01-23 MED ORDER — METHYLPREDNISOLONE ACETATE 80 MG/ML IJ SUSP
80.0000 mg | Freq: Once | INTRAMUSCULAR | Status: AC
  Administered 2023-01-23: 80 mg

## 2023-01-23 NOTE — Patient Instructions (Signed)

## 2023-01-23 NOTE — Progress Notes (Unsigned)
Functional Pain Scale - descriptive words and definitions  Mild (2)   Noticeable when not distracted/no impact on ADL's/sleep only slightly affected and able to   use both passive and active distraction for comfort. Mild range order  Average Pain  varies   +Driver, -BT, -Dye Allergies.  Lower back pain

## 2023-01-24 ENCOUNTER — Other Ambulatory Visit: Payer: Self-pay

## 2023-01-24 ENCOUNTER — Other Ambulatory Visit (HOSPITAL_BASED_OUTPATIENT_CLINIC_OR_DEPARTMENT_OTHER): Payer: Self-pay

## 2023-01-24 ENCOUNTER — Ambulatory Visit (HOSPITAL_BASED_OUTPATIENT_CLINIC_OR_DEPARTMENT_OTHER): Payer: Medicare Other | Admitting: Physical Therapy

## 2023-01-24 ENCOUNTER — Encounter (HOSPITAL_BASED_OUTPATIENT_CLINIC_OR_DEPARTMENT_OTHER): Payer: Self-pay | Admitting: Physical Therapy

## 2023-01-24 DIAGNOSIS — R2689 Other abnormalities of gait and mobility: Secondary | ICD-10-CM | POA: Diagnosis not present

## 2023-01-24 DIAGNOSIS — M25552 Pain in left hip: Secondary | ICD-10-CM

## 2023-01-24 DIAGNOSIS — M25652 Stiffness of left hip, not elsewhere classified: Secondary | ICD-10-CM

## 2023-01-24 DIAGNOSIS — Z96642 Presence of left artificial hip joint: Secondary | ICD-10-CM

## 2023-01-24 NOTE — Therapy (Signed)
OUTPATIENT PHYSICAL THERAPY THORACOLUMBAR /Discharge    Patient Name: Jimmy Palmer MRN: [id number] DOB:Dec 20, 1940, 82 y.o., male Today's Date: 01/22/2023  END OF SESSION:  PT End of Session - 01/22/23 1517     Visit Number 14    Number of Visits 16    Date for PT Re-Evaluation 01/24/23    Authorization Type progress note and Kx at 10    PT Start Time [id number]    PT Stop Time 1552    PT Time Calculation (min) 35 min    Activity Tolerance Patient tolerated treatment well    Behavior During Therapy [facility] for tasks assessed/performed               Past Medical History:  Diagnosis Date   Abnormal electrocardiogram    Abnormal heart sounds    Allergy 2020   upon arrival to [facility]   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   Chronic kidney disease 11/07/2021   Depression 2020   receiving appropriate therapy   Herniated lumbar intervertebral disc    Hypertension    Keratosis    benign   Left bundle branch block    Pacemaker    Pure hypercholesterolemia    Sick sinus syndrome (HCC)    Sleep apnea    VT (ventricular tachycardia) ([place])    Past Surgical History:  Procedure Laterality Date   CARDIOVERSION N/A 09/28/2020   Procedure: CARDIOVERSION;  Surgeon: Acie Fredrickson Wonda Cheng, MD;  Location: [place];  Service: Cardiovascular;  Laterality: N/A;   INSERT / REPLACE / REMOVE PACEMAKER     JOINT REPLACEMENT     partial right knee, medial side   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   Procedure: LEFT TOTAL HIP ARTHROPLASTY ANTERIOR APPROACH;  Surgeon: Mcarthur Rossetti, MD;  Location: [facility];  Service: Orthopedics;  Laterality: Left;   VASECTOMY     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   Left hip pain 02/01/2022   Environmental and seasonal allergies 02/01/2022   Hypertension 11/07/2021   Chronic kidney disease 11/07/2021   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 He had no pain over the past few days. He feels like he is ready to work on his exercises   PERTINENT HISTORY:  Arthritis, CKD, Herniated intervertebral discs, depression, Left BBB, THA left, VT  PAIN:  Are you having pain? Yes: NPRS scale: 6-8/10 at this time  Pain location: Lumbar spine Pain description: aching  Aggravating factors: Mornings  Relieving factors: Heat and voltarin  PRECAUTIONS: None  WEIGHT BEARING RESTRICTIONS: No  FALLS:  Has patient fallen in last 6 months? No  LIVING ENVIRONMENT: Lives with his wife  OCCUPATION: retired form the [employer]  PLOF: Independent  PATIENT GOALS: to have less pain    OBJECTIVE:   PALPATION: Spasming in the lumbar spine .   LUMBAR ROM:   AROM eval 2/12  Flexion Full    Extension Full    Right lateral flexion    Left lateral flexion    Right rotation Limited 25%  Full no pain  Left rotation Limited 25%  Full no pain   (Blank rows = not tested)  LOWER EXTREMITY ROM:     Passive  Right eval Left eval  Hip flexion Limit  No limit  Hip extension    Hip abduction    Hip adduction    Hip internal rotation Painful  Painful   Hip external rotation No limit No limit  Knee flexion    Knee extension    Ankle dorsiflexion    Ankle plantarflexion    Ankle inversion    Ankle eversion     (Blank rows = not tested)  LOWER EXTREMITY MMT:     MMT Right eval Left eval Rt/Lt 2/12  Hip flexion 39.5 30 50.8/59.2  Hip extension     Hip abduction 49.7 41.7 38.7/33.7  Hip adduction     Hip internal rotation     Hip external rotation     Knee flexion     Knee extension 61.9 39.5 56.8/69.8  Ankle dorsiflexion     Ankle plantarflexion     Ankle inversion     Ankle eversion      (Blank rows = not tested)   GAIT: Lateral movement noted. Decreased hip flexion bilatera.   TODAY'S TREATMENT:                                                                                                                              DATE:    LF row 25lbs x15 Triceps pull down cable with bar rather than ropes LF leg press 25lb x15  Xride 5 min LF hip abdcutionx15 50 lbs  Cybex chest press x15 20 lbs Assessed patients back  Reviewed set up of hamstring machine and lat pull down machine.  Manual: assessed patient left paraspinals per request  Reviewed how to use HEP    Treatment  2/26:  Cable seated row 12.5 Triceps pull down cable with bar rather than ropes Gastroc stretch on slant board Balance on airex- marching, SLS Cybex leg press 55lb Xride 5 min LF hip abdcutionx15 50 lbs  Cybex chest press x15 20 lbs   Treatment                            2/23:  Prone rib mobilizations Gym weight machines- written on [employer] paper   Treatment                            01/17/23:  - Manual: trigger point release to bilateral gluteals - PA mobilization to lumbar spine and gross throacic mobilization   - Leg press machine 2x15 25lbs  - row machine 2x15 25 lbs  - knee extension 2x15 10 lbs   Wrote patient up a sheet for him to follow at the gym.     Treatment                            01/10/23:  Prone mobility to thoracic spine and rib cage Cable machine: lat pull, dead lift, mid height row in supination, straight arm pull down Leg press machine Slant board stretch, & heel raises Figure 4  stretch    PATIENT EDUCATION:  Education details: HEP, symptom management  Person educated: Patient Education method: Explanation, Demonstration, Tactile cues, Verbal cues, and Handouts Education comprehension: verbalized understanding, returned demonstration, verbal cues required, tactile cues required, and needs further education  HOME EXERCISE PROGRAM: Access Code: [email or web address] URL: [email or web address] self correction   ASSESSMENT:  CLINICAL IMPRESSION: The patient has progressed well. The injection has helped him with his spasms. He was advised the injections work best in conjunction with his exercise program. We reviewed set up and use of exercises for gym program. Overall he feels like he is ready for discharge. Therapy will D/C to HEP at this time. See below for goal specific activity.   OBJECTIVE IMPAIRMENTS: Abnormal gait, decreased activity tolerance, difficulty walking, decreased ROM, decreased strength, increased muscle spasms, and pain.   ACTIVITY LIMITATIONS: lifting, bending, sitting, standing, squatting, stairs, transfers, and locomotion level  PARTICIPATION LIMITATIONS: meal prep, cleaning, shopping, community activity, and yard work  PERSONAL FACTORS: Age and 1-2 comorbidities: left hip replacement  are also affecting patient's functional outcome.   REHAB POTENTIAL:  Good CLINICAL DECISION MAKING: Evolving/moderate complexity  EVALUATION COMPLEXITY: Low   GOALS: Goals reviewed with patient? Yes  SHORT TERM GOALS: Target date: 12/27/2022    Patient will demonstrate full lumbar rotation without pain Baseline: Goal status: Achieved 2/28  2.  Patient will report a 50% reduction in spasming in the morning Baseline:  Goal status: Achieved 2/28  3.  Patient will increase gross left lower extremity strength by 10 pounds Baseline:  Goal status: achieved 2/28    LONG TERM GOALS: Target date: 01/24/2023    Patient will report  complete resolution of spasming in the morning in order to improve his ability to perform ADLs Baseline:  Goal status: achieved after injection 2/28   2.  Patient will have a complete exercise program including gym activity without increased pain. Baseline:  Goal status: achieved 2/28   3.  Left lower extremity strength will be within 5 pounds of right lower extremity strength to improve symmetry with gait Baseline:  Goal  status: achieved 2/28  PLAN:  PT FREQUENCY: 2x/week  PT DURATION: 8 weeks  PLANNED INTERVENTIONS: Therapeutic exercises, Therapeutic activity, Neuromuscular re-education, Balance training, Gait training, Patient/Family education, Self Care, Joint mobilization, Joint manipulation, DME instructions, Aquatic Therapy, Electrical stimulation, Cryotherapy, Moist heat, Taping, Ultrasound, and Manual therapy.  PLAN FOR NEXT SESSION: cont transition to gym program  Jessica C. Hightower PT, DPT 01/22/23 3:56 PM

## 2023-01-24 NOTE — Procedures (Signed)
Lumbosacral Transforaminal Epidural Steroid Injection - Sub-Pedicular Approach with Fluoroscopic Guidance  Patient: Jimmy Palmer      Date of Birth: 1941-10-14 MRN: [id number] PCP: [name], [name], MD      Visit Date: 01/23/2023   Universal Protocol:    Date/Time: 01/23/2023  Consent Given By: the patient  Position: PRONE  Additional Comments: Vital signs were monitored before and after the procedure. Patient was prepped and draped in the usual sterile fashion. The correct patient, procedure, and site was verified.   Injection Procedure Details:   Procedure diagnoses: Lumbar radiculopathy [M54.16]    Meds Administered:  Meds ordered this encounter  Medications   methylPREDNISolone acetate (DEPO-MEDROL) injection 80 mg    Laterality: Bilateral  Location/Site: L4  Needle:5.0 in., 22 ga.  Short bevel or Quincke spinal needle  Needle Placement: Transforaminal  Findings:    -Comments: Excellent flow of contrast along the nerve, nerve root and into the epidural space.  Patient with very shallow injury to canal with small listhesis.  Would do well with more oblique starting point.  Procedure Details: After squaring off the end-plates to get a true AP view, the C-arm was positioned so that an oblique view of the foramen as noted above was visualized. The target area is just inferior to the "nose of the scotty dog" or sub pedicular. The soft tissues overlying this structure were infiltrated with 2-3 ml. of 1% Lidocaine without Epinephrine.  The spinal needle was inserted toward the target using a "trajectory" view along the fluoroscope beam.  Under AP and lateral visualization, the needle was advanced so it did not puncture dura and was located close the 6 O'Clock position of the pedical in AP tracterory. Biplanar projections were used to confirm position. Aspiration was confirmed to be negative for CSF and/or blood. A 1-2 ml. volume of Isovue-250 was injected and flow of  contrast was noted at each level. Radiographs were obtained for documentation purposes.   After attaining the desired flow of contrast documented above, a 0.5 to 1.0 ml test dose of 0.25% Marcaine was injected into each respective transforaminal space.  The patient was observed for 90 seconds post injection.  After no sensory deficits were reported, and normal lower extremity motor function was noted,   the above injectate was administered so that equal amounts of the injectate were placed at each foramen (level) into the transforaminal epidural space.   Additional Comments:  No complications occurred Dressing: 2 x 2 sterile gauze and Band-Aid    Post-procedure details: Patient was observed during the procedure. Post-procedure instructions were reviewed.  Patient left the clinic in stable condition.

## 2023-01-24 NOTE — Progress Notes (Signed)
Jimmy Palmer - 82 y.o. male MRN [id number]  Date of birth: 1941/05/29  Office Visit Note: Visit Date: 01/23/2023 PCP: [name], [name], MD Referred by: [name], [name], MD  Subjective: Chief Complaint  Patient presents with   Lower Back - Pain   HPI:  Jimmy Palmer is a 82 y.o. male who comes in today at the request of [name], FNP for planned Bilateral L4-5 Lumbar Transforaminal epidural steroid injection with fluoroscopic guidance.  The patient has failed conservative care including home exercise, medications, time and activity modification.  This injection will be diagnostic and hopefully therapeutic.  Please see requesting physician notes for further details and justification.   ROS Otherwise per HPI.  Assessment & Plan: Visit Diagnoses:    ICD-10-CM   1. Lumbar radiculopathy  M54.16 XR C-ARM NO REPORT    Epidural Steroid injection    methylPREDNISolone acetate (DEPO-MEDROL) injection 80 mg      Plan: No additional findings.   Meds & Orders:  Meds ordered this encounter  Medications   methylPREDNISolone acetate (DEPO-MEDROL) injection 80 mg    Orders Placed This Encounter  Procedures   XR C-ARM NO REPORT   Epidural Steroid injection    Follow-up: Return for visit to requesting provider as needed.   Procedures: No procedures performed  Lumbosacral Transforaminal Epidural Steroid Injection - Sub-Pedicular Approach with Fluoroscopic Guidance  Patient: Jimmy Palmer      Date of Birth: Jun 29, 1941 MRN: [id number] PCP: [name], [name], MD      Visit Date: 01/23/2023   Universal Protocol:    Date/Time: 01/23/2023  Consent Given By: the patient  Position: PRONE  Additional Comments: Vital signs were monitored before and after the procedure. Patient was prepped and draped in the usual sterile fashion. The correct patient, procedure, and site was verified.   Injection Procedure Details:   Procedure diagnoses: Lumbar radiculopathy [M54.16]     Meds Administered:  Meds ordered this encounter  Medications   methylPREDNISolone acetate (DEPO-MEDROL) injection 80 mg    Laterality: Bilateral  Location/Site: L4  Needle:5.0 in., 22 ga.  Short bevel or Quincke spinal needle  Needle Placement: Transforaminal  Findings:    -Comments: Excellent flow of contrast along the nerve, nerve root and into the epidural space.  Patient with very shallow injury to canal with small listhesis.  Would do well with more oblique starting point.  Procedure Details: After squaring off the end-plates to get a true AP view, the C-arm was positioned so that an oblique view of the foramen as noted above was visualized. The target area is just inferior to the "nose of the scotty dog" or sub pedicular. The soft tissues overlying this structure were infiltrated with 2-3 ml. of 1% Lidocaine without Epinephrine.  The spinal needle was inserted toward the target using a "trajectory" view along the fluoroscope beam.  Under AP and lateral visualization, the needle was advanced so it did not puncture dura and was located close the 6 O'Clock position of the pedical in AP tracterory. Biplanar projections were used to confirm position. Aspiration was confirmed to be negative for CSF and/or blood. A 1-2 ml. volume of Isovue-250 was injected and flow of contrast was noted at each level. Radiographs were obtained for documentation purposes.   After attaining the desired flow of contrast documented above, a 0.5 to 1.0 ml test dose of 0.25% Marcaine was injected into each respective transforaminal space.  The patient was observed for 90  seconds post injection.  After no sensory deficits were reported, and normal lower extremity motor function was noted,   the above injectate was administered so that equal amounts of the injectate were placed at each foramen (level) into the transforaminal epidural space.   Additional Comments:  No complications occurred Dressing: 2 x 2  sterile gauze and Band-Aid    Post-procedure details: Patient was observed during the procedure. Post-procedure instructions were reviewed.  Patient left the clinic in stable condition.    Clinical History: EXAM: CT LUMBAR SPINE WITHOUT CONTRAST   TECHNIQUE: Multidetector CT imaging of the lumbar spine was performed without intravenous contrast administration. Multiplanar CT image reconstructions were also generated.   RADIATION DOSE REDUCTION: This exam was performed according to the departmental dose-optimization program which includes automated exposure control, adjustment of the mA and/or kV according to patient size and/or use of iterative reconstruction technique.   COMPARISON:  Lumbar spine series 10/30/2022   FINDINGS: Segmentation: 5 lumbar type vertebrae.   Alignment: Grade 1 anterolisthesis of L4 on L5 due to facet arthropathy.   Vertebrae: Vertebral body heights are maintained. There is mild-to-moderate spondylosis throughout the lumbar spine to include facet arthropathy.   Paraspinal and other soft tissues: Minimal calcified plaque of the abdominal aorta which is normal in caliber. No other significant soft tissue abnormality.   Disc levels: Moderate disc space narrowing at the L4-5 level and to lesser extent at the L2-3 level and L3-4 levels.   L1-2 level demonstrates no significant disc disease. No spinal canal stenosis or neural foraminal narrowing.   L2-3 level demonstrates a moderate broad-based disc bulge. No significant canal stenosis. Minimal bilateral neural foraminal narrowing.   L3-4 level demonstrates a moderate broad-based disc bulge. There is minimal canal stenosis due to disc disease, facet arthropathy and ligamentum flavum hypertrophy. Mild bilateral neural foraminal narrowing.   L4-5 level demonstrates a moderate broad-based disc bulge 2. There is moderate spinal canal stenosis due to disc disease, ligamentum flavum hypertrophy  and facet arthropathy. Moderate bilateral neural foraminal narrowing right worse than left.   L5-S1 level demonstrates no significant disc disease or spinal canal stenosis. No significant neural foraminal narrowing.   IMPRESSION: 1. No acute findings. 2. 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 3. 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 Moderate bilateral neural foraminal narrowing at the L4-5 level right worse than left. Minimal bilateral neural foraminal narrowing at L2-3 and L3-4. 4. Grade 1 anterolisthesis of L4 on L5 due to facet arthropathy.     Electronically Signed   By: Marin Olp M.D.   On: 01/04/2023 13:23     Objective:  VS:  HT:    WT:   BMI:     BP:(!) 156/83  HR:81bpm  TEMP: ( )  RESP:  Physical Exam Vitals and nursing note reviewed.  Constitutional:      General: He is not in acute distress.    Appearance: Normal appearance. He is not ill-appearing.  HENT:     Head: Normocephalic and atraumatic.     Right Ear: External ear normal.     Left Ear: External ear normal.     Nose: No congestion.  Eyes:     Extraocular Movements: Extraocular movements intact.  Cardiovascular:     Rate and Rhythm: Normal rate.     Pulses: Normal pulses.  Pulmonary:     Effort: Pulmonary effort is normal. No respiratory distress.  Abdominal:     General: There is no distension.     Palpations: Abdomen is soft.  Musculoskeletal:        General: No tenderness or signs of injury.     Cervical back: Neck supple.     Right lower leg: No edema.     Left lower leg: No edema.     Comments: Patient has good distal strength without clonus.  Skin:    Findings: No erythema or rash.  Neurological:     General: No focal deficit present.     Mental Status: He is alert and oriented to person, place, and time.     Sensory: No sensory deficit.     Motor: No  weakness or abnormal muscle tone.     Coordination: Coordination normal.  Psychiatric:        Mood and Affect: Mood normal.        Behavior: Behavior normal.      Imaging: No results found.

## 2023-01-25 ENCOUNTER — Telehealth: Payer: Self-pay | Admitting: Physical Medicine and Rehabilitation

## 2023-01-25 NOTE — Telephone Encounter (Signed)
Pt called requesting a call back to give update from injection. Please call pt at [phone].Pt is also wanting a call back to thank Dr Ernestina Patches for helping him. Please call pt as soon as you can.

## 2023-01-30 NOTE — Telephone Encounter (Signed)
Spoke with patient and he wanted to give his sincerest gratitude for everything. He stated he has had great relief so far. Advised patient to give [place] a call if and when pain return. Verbalized understanding

## 2023-01-30 NOTE — Telephone Encounter (Signed)
LVM to return call to clinic.

## 2023-02-01 ENCOUNTER — Encounter: Payer: Self-pay | Admitting: Radiology

## 2023-02-10 ENCOUNTER — Other Ambulatory Visit (HOSPITAL_BASED_OUTPATIENT_CLINIC_OR_DEPARTMENT_OTHER): Payer: Self-pay

## 2023-02-10 ENCOUNTER — Other Ambulatory Visit (HOSPITAL_BASED_OUTPATIENT_CLINIC_OR_DEPARTMENT_OTHER): Payer: Self-pay | Admitting: Family Medicine

## 2023-02-12 ENCOUNTER — Other Ambulatory Visit (HOSPITAL_BASED_OUTPATIENT_CLINIC_OR_DEPARTMENT_OTHER): Payer: Self-pay

## 2023-02-12 MED FILL — Losartan Potassium & Hydrochlorothiazide Tab 100-12.5 MG: ORAL | 90 days supply | Qty: 90 | Fill #0 | Status: AC

## 2023-02-14 ENCOUNTER — Other Ambulatory Visit (HOSPITAL_BASED_OUTPATIENT_CLINIC_OR_DEPARTMENT_OTHER): Payer: Self-pay | Admitting: Orthopaedic Surgery

## 2023-02-14 ENCOUNTER — Other Ambulatory Visit (HOSPITAL_BASED_OUTPATIENT_CLINIC_OR_DEPARTMENT_OTHER): Payer: Self-pay

## 2023-02-14 MED ORDER — METHOCARBAMOL 500 MG PO TABS
500.0000 mg | ORAL_TABLET | Freq: Four times a day (QID) | ORAL | 3 refills | Status: DC
  Filled 2023-02-14: qty 30, 8d supply, fill #0
  Filled 2023-03-09: qty 30, 8d supply, fill #1

## 2023-02-19 ENCOUNTER — Other Ambulatory Visit (HOSPITAL_BASED_OUTPATIENT_CLINIC_OR_DEPARTMENT_OTHER): Payer: Self-pay

## 2023-02-20 ENCOUNTER — Other Ambulatory Visit: Payer: Self-pay | Admitting: Internal Medicine

## 2023-02-20 ENCOUNTER — Other Ambulatory Visit (HOSPITAL_BASED_OUTPATIENT_CLINIC_OR_DEPARTMENT_OTHER): Payer: Self-pay

## 2023-02-20 DIAGNOSIS — I4819 Other persistent atrial fibrillation: Secondary | ICD-10-CM

## 2023-02-21 ENCOUNTER — Other Ambulatory Visit (HOSPITAL_BASED_OUTPATIENT_CLINIC_OR_DEPARTMENT_OTHER): Payer: Self-pay

## 2023-02-21 MED ORDER — APIXABAN 5 MG PO TABS
5.0000 mg | ORAL_TABLET | Freq: Two times a day (BID) | ORAL | 1 refills | Status: DC
  Filled 2023-02-21: qty 180, 90d supply, fill #0
  Filled 2023-06-05: qty 180, 90d supply, fill #1

## 2023-02-21 NOTE — Telephone Encounter (Signed)
Prescription refill request for Eliquis received. Indication: Afib  Last office visit: 12/25/22 Caryl Comes)  Scr: 1.22 (05/06/22)  Age: 82 Weight: 79.1kg  Appropriate dose. Refill sent.

## 2023-02-28 ENCOUNTER — Encounter (HOSPITAL_BASED_OUTPATIENT_CLINIC_OR_DEPARTMENT_OTHER): Payer: Self-pay | Admitting: Family Medicine

## 2023-02-28 ENCOUNTER — Other Ambulatory Visit (HOSPITAL_BASED_OUTPATIENT_CLINIC_OR_DEPARTMENT_OTHER): Payer: Self-pay

## 2023-02-28 ENCOUNTER — Ambulatory Visit (INDEPENDENT_AMBULATORY_CARE_PROVIDER_SITE_OTHER): Payer: Medicare Other | Admitting: Family Medicine

## 2023-02-28 VITALS — BP 144/85 | HR 83 | Ht 69.0 in | Wt 174.0 lb

## 2023-02-28 DIAGNOSIS — L02511 Cutaneous abscess of right hand: Secondary | ICD-10-CM | POA: Diagnosis not present

## 2023-02-28 DIAGNOSIS — S6991XA Unspecified injury of right wrist, hand and finger(s), initial encounter: Secondary | ICD-10-CM | POA: Insufficient documentation

## 2023-02-28 MED ORDER — DOXYCYCLINE HYCLATE 100 MG PO TABS
100.0000 mg | ORAL_TABLET | Freq: Two times a day (BID) | ORAL | 0 refills | Status: AC
  Filled 2023-02-28: qty 14, 7d supply, fill #0

## 2023-02-28 NOTE — Progress Notes (Signed)
   Acute Office Visit  Subjective:     Patient ID: Jimmy Palmer, male    DOB: 1941/02/23, 82 y.o.   MRN: [id number]  Chief Complaint  Patient presents with   Laceration    Pt here for cutting his finger on his right hand (index finger) happened two weeks, pt stated he believes it has become infected    Uses [employer] razors and threw something in a canister and went to grab it out and cut his right index finger. He reports that his finger was more swollen and red than it is now. Has been using triple antibiotic cream daily.   Review of Systems  Constitutional:  Negative for chills and fever.  Respiratory:  Negative for cough.   Cardiovascular:  Negative for chest pain.  Musculoskeletal:  Negative for joint pain.   Objective:    BP (!) 144/85 (BP Location: Right Arm, Patient Position: Sitting, Cuff Size: Normal)   Pulse 83   Ht 5\' 9"  (1.753 m)   Wt 174 lb (78.9 kg)   SpO2 100%   BMI 25.70 kg/m   Physical Exam Constitutional:      Appearance: Normal appearance.  Skin:    Findings: Abscess (R index finger) present.  Neurological:     Mental Status: He is alert.  Psychiatric:        Mood and Affect: Mood normal.        Behavior: Behavior normal.        Thought Content: Thought content normal.        Judgment: Judgment normal.      Assessment & Plan:   1. Abscess of right index finger Non-fluctuant abscess present on patient's right index fingers. Minor swelling present. Slight tenderness. No redness present. No drainage present. Recommend continuing conservative management with antibiotic treatment. Prescribed doxycycline 100mg  BID for 7 days. Advised patient if this does not improve, to seek care at [facility] or urgent care.   Return if symptoms worsen or fail to improve.  Les Pou, FNP

## 2023-03-09 ENCOUNTER — Other Ambulatory Visit (HOSPITAL_BASED_OUTPATIENT_CLINIC_OR_DEPARTMENT_OTHER): Payer: Self-pay

## 2023-03-21 ENCOUNTER — Other Ambulatory Visit (HOSPITAL_BASED_OUTPATIENT_CLINIC_OR_DEPARTMENT_OTHER): Payer: Self-pay

## 2023-03-21 MED FILL — Simvastatin Tab 40 MG: ORAL | 90 days supply | Qty: 90 | Fill #0 | Status: AC

## 2023-03-22 ENCOUNTER — Other Ambulatory Visit (HOSPITAL_BASED_OUTPATIENT_CLINIC_OR_DEPARTMENT_OTHER): Payer: Self-pay

## 2023-04-01 MED FILL — Sertraline HCl Tab 25 MG: ORAL | 90 days supply | Qty: 90 | Fill #1 | Status: AC

## 2023-04-01 MED FILL — Bupropion HCl Tab ER 12HR 150 MG: ORAL | 90 days supply | Qty: 90 | Fill #1 | Status: AC

## 2023-04-05 ENCOUNTER — Telehealth: Payer: Self-pay | Admitting: Internal Medicine

## 2023-04-05 NOTE — Telephone Encounter (Signed)
Spoke with pt's wife, DPR who reports pt had an episode about 1 month ago with symptoms of dizzness, diaphoresis, cold and clammy skin and blurred vision in his right eye.  Pt's wife states pt fell into the chest of drawers without injury.  BP 120's/80's with normal neuro exam.  Episode lasted for about 30 minutes and pt recovered back to baseline.  No further S/SX since then.  She reports pt is taking medications as prescribed.   Pt's wife advised will forward information to make Dr Graciela Husbands aware.  Pt is scheduled to see Dr Graciela Husbands 04/06/2023.

## 2023-04-05 NOTE — Telephone Encounter (Signed)
Patient wife is calling requesting to speak with Mindi Junker in regards to the patient before his appt tomorrow. Please advise.

## 2023-04-06 ENCOUNTER — Ambulatory Visit: Payer: Medicare Other | Attending: Internal Medicine | Admitting: Internal Medicine

## 2023-04-06 ENCOUNTER — Encounter: Payer: Self-pay | Admitting: Internal Medicine

## 2023-04-06 VITALS — BP 143/68 | HR 65 | Ht 69.0 in | Wt 175.2 lb

## 2023-04-06 DIAGNOSIS — I4719 Other supraventricular tachycardia: Secondary | ICD-10-CM | POA: Insufficient documentation

## 2023-04-06 DIAGNOSIS — Z950 Presence of cardiac pacemaker: Secondary | ICD-10-CM | POA: Insufficient documentation

## 2023-04-06 DIAGNOSIS — I442 Atrioventricular block, complete: Secondary | ICD-10-CM | POA: Diagnosis present

## 2023-04-06 DIAGNOSIS — I4819 Other persistent atrial fibrillation: Secondary | ICD-10-CM | POA: Insufficient documentation

## 2023-04-06 NOTE — Progress Notes (Signed)
ELECTROPHYSIOLOGY OFFICE  NOTE  Patient ID: Jimmy Palmer, MRN: [id number], DOB/AGE: 1941-07-20 82 y.o. Admit date: (Not on file) Date of Consult: 04/06/2023  Primary Physician: de [place], Buren Kos, MD Primary Cardiologist    HPI Jimmy Palmer is a 82 y.o. 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  Anticoagulation with Apixoban  no  bleeding   When last seen, persistent atrial fibrillation/flutter noted.  Cardioversion undertaken.  Symptoms much improved with less fatigue.  While not able to have identified symptoms associated with the fibrillation/flutter, noted a significant difference following cardioversion. No interval atrial fibrillation   The patient denies chest pain, shortness of breath, nocturnal dyspnea, orthopnea or peripheral edema.  There have been no palpitations  or syncope.  An episode of lightheadedness  upon standing having moved moved across the house into another room; at that point was aware of blurriness in his right visual field (he describes it as his right eye) lasted about 30 seconds and then it is gone    Device has reached ERI; has noted no change in functional status  Date Cr K Hgb  9/19 1.08 4.2     1/21 1.33 4.5 13.6  12/22 1.36 4.3 13.5  6/23   10.6    DATE TEST EF   8/14 Echo   60 %   4/16 MYOVIEW   56 %  normal perfusion        Thromboembolic risk factors ( age  -2, HTN-1) for a CHADSVASc Score of >=3   Past Medical History:  Diagnosis Date   Abnormal electrocardiogram    Abnormal heart sounds    Allergy 2020   upon arrival to [facility]   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   Chronic kidney disease 11/07/2021   Depression 2020   receiving appropriate therapy   Herniated lumbar intervertebral disc    Hypertension    Keratosis    benign   Left bundle branch block    Pacemaker    Pure  hypercholesterolemia    Sick sinus syndrome (HCC)    Sleep apnea    VT (ventricular tachycardia) (HCC)       Surgical History:  Past Surgical History:  Procedure Laterality Date   CARDIOVERSION N/A 09/28/2020   Procedure: CARDIOVERSION;  Surgeon: Nahser, Deloris Ping, MD;  Location: [facility];  Service: Cardiovascular;  Laterality: N/A;   INSERT / REPLACE / REMOVE PACEMAKER     JOINT REPLACEMENT     partial right knee, medial side   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   buPROPion (WELLBUTRIN SR) 150 MG 12 hr tablet Take 1 tablet (150 mg total) by mouth every morning.   diclofenac Sodium (VOLTAREN) 1 % GEL Apply 1 application topically as needed (pain).    diphenhydrAMINE (BENADRYL) 25 mg capsule Take 25 mg by mouth at bedtime.    doxazosin (CARDURA) 4 MG tablet Take 1 tablet (4 mg total) by mouth daily.   losartan-hydrochlorothiazide (HYZAAR) 100-12.5  MG tablet Take 1 tablet by mouth daily.   Melatonin 5 MG TABS Take 5 mg by mouth at bedtime.    methocarbamol (ROBAXIN) 500 MG tablet Take 1 tablet (500 mg total) by mouth 4 (four) times daily.   Multiple Vitamin (MULTIVITAMIN PO) Take 2 tablets by mouth once a week.   sertraline (ZOLOFT) 25 MG tablet Take 1 tablet (25 mg total) by mouth daily.   simvastatin (ZOCOR) 40 MG tablet Take 1 tablet (40 mg total) by mouth every evening.     Allergies:  Allergies  Allergen Reactions   Amoxicillin Diarrhea         All other systems reviewed and negative.   BP 124/74 (Patient Position: Supine)   Pulse 64   Ht 5\' 9"  (1.753 m)   Wt 175 lb 3.2 oz (79.5 kg)   SpO2 95%   BMI 25.87 kg/m   Well developed and well nourished in no acute  distress HENT normal Neck supple with JVP-flat Clear Device pocket well healed; without hematoma or erythema.  There is no tethering  Regular rate and rhythm, no  gallop No murmur Abd-soft with active BS No Clubbing cyanosis  edema Skin-warm and dry A & Oriented  Grossly normal sensory and motor function  ECG V pacing  with complete haert block   Device function is normal.  Battery approaching ERI Programming changes none  See Paceart for details    Assessment and Plan:   Complete heart block  Pacemaker Medtronic       Atrial tach  Hypertension  Pocket Tethering   Atrial fibrillation persistent   ETOH use acknowledges less  Anemia   Complete heart block is stable.  Pacemaker is reverted to VVI.  Surprisingly and thankfully, no significant symptoms. We have reviewed the benefits and risks of generator replacement.  These include but are not limited to lead fracture and infection.  The patient understands, agrees and is willing to proceed.    Had an episode of lightheadedness associated with impaired right visual field.  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  Blood pressure stable.  Continue Hyzaar and Cardura.  No bleeding on the Eliquis.  Continue.  His last hemoglobin June 2023 was considerably lower.  We will plan to repeat this.   Sherryl Manges

## 2023-04-06 NOTE — Patient Instructions (Signed)
Medication Instructions:  Your physician recommends that you continue on your current medications as directed. Please refer to the Current Medication list given to you today.  *If you need a refill on your cardiac medications before your next appointment, please call your pharmacy*   Lab Work: None ordered.  If you have labs (blood work) drawn today and your tests are completely normal, you will receive your results only by: MyChart Message (if you have MyChart) OR A paper copy in the mail If you have any lab test that is abnormal or we need to change your treatment, we will call you to review the results.   Testing/Procedures: None ordered.    Follow-Up: At [facility], you and your health needs are our priority.  As part of our continuing mission to provide you with exceptional heart care, we have created designated Provider Care Teams.  These Care Teams include your primary Cardiologist (physician) and Advanced Practice Providers (APPs -  Physician Assistants and Nurse Practitioners) who all work together to provide you with the care you need, when you need it.  We recommend signing up for the patient portal called "MyChart".  Sign up information is provided on this After Visit Summary.  MyChart is used to connect with patients for Virtual Visits (Telemedicine).  Patients are able to view lab/test results, encounter notes, upcoming appointments, etc.  Non-urgent messages can be sent to your provider as well.   To learn more about what you can do with MyChart, go to [email or web address].    Your next appointment:   To be scheduled 

## 2023-04-09 ENCOUNTER — Other Ambulatory Visit: Payer: Self-pay

## 2023-04-09 DIAGNOSIS — I4819 Other persistent atrial fibrillation: Secondary | ICD-10-CM

## 2023-04-09 DIAGNOSIS — Z950 Presence of cardiac pacemaker: Secondary | ICD-10-CM

## 2023-04-09 DIAGNOSIS — Z01812 Encounter for preprocedural laboratory examination: Secondary | ICD-10-CM

## 2023-04-09 DIAGNOSIS — I442 Atrioventricular block, complete: Secondary | ICD-10-CM

## 2023-04-09 NOTE — Progress Notes (Signed)
CBC and BMET, pre-procedure generator change per Dr Graciela Husbands

## 2023-04-10 NOTE — Telephone Encounter (Signed)
Spoke with pt and reviewed Device Instruction Letter.  Pt will pick up surgical scrub at Lab appointment.  See letter for complete instructions.  Pt verbalizes understanding and agrees with current plan.

## 2023-04-11 ENCOUNTER — Telehealth: Payer: Self-pay | Admitting: Internal Medicine

## 2023-04-11 NOTE — Telephone Encounter (Signed)
Patient's wife is calling wanting to reschedule this procedure for an early morning or afternoon due to them living in [place].   She is requesting the patient be called back to reschedule due to her being out of town, and also wanted to apologize for th inconvenience for needing to reschedule.   Please advise.

## 2023-04-13 NOTE — Telephone Encounter (Signed)
Attempted phone call to pt's wife.  Per EPIC ok to leave detailed message. .  Advised will be happy to reschedule procedure but Dr Odessa Fleming schedule is very limited the month of June and Dr Graciela Husbands specifically had RN to schedule pt on this date even though provider is scheduled to be out of the office/hospital.  Advised to contact [phone] for further assistance.

## 2023-04-16 NOTE — Telephone Encounter (Signed)
Attempted phone call to pt and left voicemail message to contact RN at [phone]. 

## 2023-04-16 NOTE — Telephone Encounter (Signed)
Pt calling to speak to nurse again about his procedure

## 2023-04-19 NOTE — Telephone Encounter (Addendum)
This encounter has been addressed in another encounter.  See that message below.  Nothing further needed at this time.  Jimmy Palmer      04/17/23  4:55 PM Mindi Junker, thank you so much for your response. I intend to keep the 30 April 2023 @5 :30 am appointment for a new pace maker implant. This device is coming down to the end of it's life expectancy and I for one do not want to push the envelope. I asked Dr.Klein to give me "the latest and greatest" device on the market.In addition, I will follow your instructions "to the letter" and on 24 Apr 2023 come in for Labs and surgical soap. You are dealing with a patient who lends new meaning to the terms dicipline and detail which I have practiced for decades in my profession.Mindi Junker, thanks so much for all your help which I truely appreciate Best regards, Jimmy Palmer ,FBI AGENT(RET)

## 2023-04-24 ENCOUNTER — Ambulatory Visit: Payer: Medicare Other | Attending: Internal Medicine

## 2023-04-24 DIAGNOSIS — Z01812 Encounter for preprocedural laboratory examination: Secondary | ICD-10-CM

## 2023-04-24 DIAGNOSIS — Z950 Presence of cardiac pacemaker: Secondary | ICD-10-CM

## 2023-04-24 DIAGNOSIS — I4819 Other persistent atrial fibrillation: Secondary | ICD-10-CM

## 2023-04-24 DIAGNOSIS — I442 Atrioventricular block, complete: Secondary | ICD-10-CM

## 2023-04-25 ENCOUNTER — Other Ambulatory Visit (HOSPITAL_BASED_OUTPATIENT_CLINIC_OR_DEPARTMENT_OTHER): Payer: Self-pay

## 2023-04-25 ENCOUNTER — Ambulatory Visit (HOSPITAL_BASED_OUTPATIENT_CLINIC_OR_DEPARTMENT_OTHER): Payer: Medicare Other | Admitting: Family Medicine

## 2023-04-25 LAB — CBC
Hematocrit: 36.3 % — ABNORMAL LOW (ref 37.5–51.0)
Hemoglobin: 11.8 g/dL — ABNORMAL LOW (ref 13.0–17.7)
MCH: 30.4 pg (ref 26.6–33.0)
MCHC: 32.5 g/dL (ref 31.5–35.7)
MCV: 94 fL (ref 79–97)
Platelets: 250 10*3/uL (ref 150–450)
RBC: 3.88 x10E6/uL — ABNORMAL LOW (ref 4.14–5.80)
RDW: 11.7 % (ref 11.6–15.4)
WBC: 10.3 10*3/uL (ref 3.4–10.8)

## 2023-04-25 LAB — BASIC METABOLIC PANEL
BUN/Creatinine Ratio: 14 (ref 10–24)
BUN: 21 mg/dL (ref 8–27)
CO2: 18 mmol/L — ABNORMAL LOW (ref 20–29)
Calcium: 8.9 mg/dL (ref 8.6–10.2)
Chloride: 101 mmol/L (ref 96–106)
Creatinine, Ser: 1.46 mg/dL — ABNORMAL HIGH (ref 0.76–1.27)
Glucose: 105 mg/dL — ABNORMAL HIGH (ref 70–99)
Potassium: 4.5 mmol/L (ref 3.5–5.2)
Sodium: 134 mmol/L (ref 134–144)
eGFR: 48 mL/min/{1.73_m2} — ABNORMAL LOW (ref >59)

## 2023-04-27 NOTE — Pre-Procedure Instructions (Signed)
Attempted to call patient regarding procedure instructions.  Left voicemail on the following items: Arrival time 0530 Nothing to eat or drink after midnight No meds AM of procedure Responsible person to drive you home and stay with you for 24 hrs Wash with special soap night before and morning of procedure If on anti-coagulant drug instructions Eliquis- last dose should be 6/1, AM dose.

## 2023-04-30 ENCOUNTER — Ambulatory Visit (HOSPITAL_COMMUNITY)
Admission: RE | Admit: 2023-04-30 | Discharge: 2023-04-30 | Disposition: A | Discharge status: Home / Self Care | Payer: Medicare Other | Attending: Internal Medicine | Admitting: Internal Medicine

## 2023-04-30 ENCOUNTER — Other Ambulatory Visit: Payer: Self-pay

## 2023-04-30 ENCOUNTER — Encounter (HOSPITAL_COMMUNITY)
Admission: RE | Disposition: A | Discharge status: Home / Self Care | Payer: Self-pay | Source: Home / Self Care | Attending: Internal Medicine

## 2023-04-30 DIAGNOSIS — Z45010 Encounter for checking and testing of cardiac pacemaker pulse generator [battery]: Secondary | ICD-10-CM | POA: Diagnosis present

## 2023-04-30 DIAGNOSIS — I10 Essential (primary) hypertension: Secondary | ICD-10-CM | POA: Diagnosis not present

## 2023-04-30 DIAGNOSIS — I4819 Other persistent atrial fibrillation: Secondary | ICD-10-CM | POA: Diagnosis not present

## 2023-04-30 DIAGNOSIS — Z950 Presence of cardiac pacemaker: Secondary | ICD-10-CM

## 2023-04-30 DIAGNOSIS — F1090 Alcohol use, unspecified, uncomplicated: Secondary | ICD-10-CM | POA: Diagnosis not present

## 2023-04-30 DIAGNOSIS — I442 Atrioventricular block, complete: Secondary | ICD-10-CM | POA: Insufficient documentation

## 2023-04-30 DIAGNOSIS — D649 Anemia, unspecified: Secondary | ICD-10-CM | POA: Diagnosis not present

## 2023-04-30 DIAGNOSIS — I447 Left bundle-branch block, unspecified: Secondary | ICD-10-CM | POA: Insufficient documentation

## 2023-04-30 DIAGNOSIS — Z79899 Other long term (current) drug therapy: Secondary | ICD-10-CM | POA: Insufficient documentation

## 2023-04-30 HISTORY — PX: PPM GENERATOR CHANGEOUT: EP1233

## 2023-04-30 SURGERY — PPM GENERATOR CHANGEOUT

## 2023-04-30 MED ORDER — ACETAMINOPHEN 325 MG PO TABS: 325.0000 mg | ORAL_TABLET | ORAL | Status: DC | PRN

## 2023-04-30 MED ORDER — SODIUM CHLORIDE 0.9 % IV SOLN: 80.0000 mg | INTRAVENOUS | Status: AC

## 2023-04-30 MED ORDER — LIDOCAINE HCL (PF) 1 % IJ SOLN
INTRAMUSCULAR | Status: DC | PRN
  Administered 2023-04-30: 50 mL

## 2023-04-30 MED ORDER — SODIUM CHLORIDE 0.9 % IV SOLN: INTRAVENOUS | Status: DC

## 2023-04-30 MED ORDER — CHLORHEXIDINE GLUCONATE 4 % EX SOLN: 4.0000 | Freq: Once | CUTANEOUS | Status: DC

## 2023-04-30 MED ORDER — LIDOCAINE HCL 1 % IJ SOLN
INTRAMUSCULAR | Status: AC
  Filled 2023-04-30: qty 60

## 2023-04-30 MED ORDER — ONDANSETRON HCL 4 MG/2ML IJ SOLN: 4.0000 mg | Freq: Four times a day (QID) | INTRAMUSCULAR | Status: DC | PRN

## 2023-04-30 MED ORDER — VANCOMYCIN HCL IN DEXTROSE 1-5 GM/200ML-% IV SOLN
INTRAVENOUS | Status: AC
  Administered 2023-04-30: 1000 mg via INTRAVENOUS
  Filled 2023-04-30: qty 200

## 2023-04-30 MED ORDER — SODIUM CHLORIDE 0.9 % IV SOLN
INTRAVENOUS | Status: AC
  Administered 2023-04-30: 80 mg
  Filled 2023-04-30: qty 2

## 2023-04-30 MED ORDER — VANCOMYCIN HCL IN DEXTROSE 1-5 GM/200ML-% IV SOLN: 1000.0000 mg | INTRAVENOUS | Status: AC

## 2023-04-30 SURGICAL SUPPLY — 7 items
CABLE SURGICAL S-101-97-12 (CABLE) ×1 IMPLANT
IPG PACE AZUR XT DR MRI W1DR01 (Pacemaker) IMPLANT
PACE AZURE XT DR MRI W1DR01 (Pacemaker) ×1 IMPLANT
PAD DEFIB RADIO PHYSIO CONN (PAD) ×1 IMPLANT
POUCH AIGIS-R ANTIBACT PPM (Mesh General) ×1 IMPLANT
POUCH AIGIS-R ANTIBACT PPM MED (Mesh General) IMPLANT
TRAY PACEMAKER INSERTION (PACKS) ×1 IMPLANT

## 2023-04-30 NOTE — H&P (Signed)
ELECTROPHYSIOLOGY OFFICE  NOTE  Patient ID: Jimmy Palmer, MRN: [id number], DOB/AGE: Sep 18, 1941 82 y.o. Admit date: (Not on file) Date of Consult: 04/06/2023   Primary Physician: de [place], Buren Kos, MD Primary Cardiologist     HPI Jimmy Palmer is a 82 y.o. 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  Anticoagulation with Apixoban  no  bleeding    When last seen, persistent atrial fibrillation/flutter noted.  Cardioversion undertaken.  Symptoms much improved with less fatigue.  While not able to have identified symptoms associated with the fibrillation/flutter, noted a significant difference following cardioversion. No interval atrial fibrillation    The patient denies chest pain, shortness of breath, nocturnal dyspnea, orthopnea or peripheral edema.  There have been no palpitations  or syncope.  An episode of lightheadedness  upon standing having moved moved across the house into another room; at that point was aware of blurriness in his right visual field (he describes it as his right eye) lasted about 30 seconds and then it is gone    Device has reached ERI; has noted no change in functional status   Date Cr K Hgb  9/19 1.08 4.2     1/21 1.33 4.5 13.6  12/22 1.36 4.3 13.5  6/23     10.6      DATE TEST EF    8/14 Echo   60 %    4/16 MYOVIEW   56 %  normal perfusion             Thromboembolic risk factors ( age  -2, HTN-1) for a CHADSVASc Score of >=3         Past Medical History:  Diagnosis Date   Abnormal electrocardiogram     Abnormal heart sounds     Allergy 2020    upon arrival [facility]   Arthritis      lumbar area of back & left hip   Chronic kidney disease 11/07/2021   Depression 2020    receiving appropriate therapy   Herniated lumbar intervertebral disc     Hypertension     Keratosis      benign   Left bundle  branch block     Pacemaker     Pure hypercholesterolemia     Sick sinus syndrome (HCC)     Sleep apnea     VT (ventricular tachycardia) (HCC)         Surgical History:       Past Surgical History:  Procedure Laterality Date   CARDIOVERSION N/A 09/28/2020    Procedure: CARDIOVERSION;  Surgeon: Nahser, Deloris Ping, MD;  Location: [facility];  Service: Cardiovascular;  Laterality: N/A;   INSERT / REPLACE / REMOVE PACEMAKER       JOINT REPLACEMENT        partial right knee, medial side   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    Procedure: LEFT TOTAL HIP ARTHROPLASTY ANTERIOR APPROACH;  Surgeon: Kathryne Hitch, MD;  Location: [facility];  Service: Orthopedics;  Laterality: Left;   VASECTOMY          Active Medications      Current Meds  Medication Sig   apixaban (ELIQUIS) 5 MG TABS tablet Take 1 tablet (5 mg total) by mouth 2 (two) times daily.   buPROPion (WELLBUTRIN SR) 150 MG 12 hr tablet  Take 1 tablet (150 mg total) by mouth every morning.   diclofenac Sodium (VOLTAREN) 1 % GEL Apply 1 application topically as needed (pain).    diphenhydrAMINE (BENADRYL) 25 mg capsule Take 25 mg by mouth at bedtime.    doxazosin (CARDURA) 4 MG tablet Take 1 tablet (4 mg total) by mouth daily.   losartan-hydrochlorothiazide (HYZAAR) 100-12.5 MG tablet Take 1 tablet by mouth daily.   Melatonin 5 MG TABS Take 5 mg by mouth at bedtime.    methocarbamol (ROBAXIN) 500 MG tablet Take 1 tablet (500 mg total) by mouth 4 (four) times daily.   Multiple Vitamin (MULTIVITAMIN PO) Take 2 tablets by mouth once a week.   sertraline (ZOLOFT) 25 MG tablet Take 1 tablet (25 mg total) by mouth daily.   simvastatin (ZOCOR) 40 MG tablet Take 1 tablet (40 mg total) by mouth every evening.          Allergies:      Allergies  Allergen Reactions   Amoxicillin Diarrhea           All other systems reviewed and negative.    BP 124/74 (Patient Position: Supine)   Pulse 64   Ht 5\' 9"  (1.753 m)   Wt 175 lb 3.2  oz (79.5 kg)   SpO2 95%   BMI 25.87 kg/m   Well developed and well nourished in no acute distress HENT normal Neck supple with JVP-flat Clear Device pocket well healed; without hematoma or erythema.  There is no tethering  Regular rate and rhythm, no  gallop No murmur Abd-soft with active BS No Clubbing cyanosis  edema Skin-warm and dry A & Oriented  Grossly normal sensory and motor function   ECG V pacing  with complete haert block    Device function is normal.  Battery approaching ERI Programming changes none  See Paceart for details     Assessment and Plan:    Complete heart block   Pacemaker Medtronic        Atrial tach   Hypertension   Pocket Tethering    Atrial fibrillation persistent    ETOH use acknowledges less   Anemia     Complete heart block is stable.  Pacemaker is reverted to VVI.  Surprisingly and thankfully, no significant symptoms. We have reviewed the benefits and risks of generator replacement.  These include but are not limited to lead fracture and infection.  The patient understands, agrees and is willing to proceed.     Had an episode of lightheadedness associated with impaired right visual field.  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   Blood pressure stable.  Continue Hyzaar and Cardura.   No bleeding on the Eliquis.  Continue.  His last hemoglobin June 2023 was considerably lower.  We will plan to repeat this.     Sherryl Manges  EP Attending  Patient seen and examined. Dr. Graciela Husbands has been stranded in [place] due to inclement weather and I have been asked to change out Jimmy Palmer PPM as he is at [facility] and reverted to VVI 65. I have reviewed the indications/risks/benefits/goals/expectations and he wishes to proceed.  Sharlot Gowda [name],MD

## 2023-04-30 NOTE — Progress Notes (Signed)
Patient and wife was given discharge instructions. Both verbalized understanding. 

## 2023-04-30 NOTE — Discharge Instructions (Signed)

## 2023-05-01 ENCOUNTER — Encounter (HOSPITAL_COMMUNITY): Payer: Self-pay | Admitting: Internal Medicine

## 2023-05-07 ENCOUNTER — Ambulatory Visit: Payer: Medicare Other | Admitting: Orthopedic Surgery

## 2023-05-07 ENCOUNTER — Telehealth: Payer: Self-pay | Admitting: Internal Medicine

## 2023-05-07 NOTE — Telephone Encounter (Signed)
This has been addressed in another encounter.  See that encounter for complete details. 

## 2023-05-07 NOTE — Telephone Encounter (Signed)
Patient states he is returning a call directly from Dr. Graciela Husbands.

## 2023-05-10 ENCOUNTER — Ambulatory Visit: Payer: Medicare Other | Attending: Cardiology

## 2023-05-10 DIAGNOSIS — I442 Atrioventricular block, complete: Secondary | ICD-10-CM | POA: Insufficient documentation

## 2023-05-10 LAB — CUP PACEART INCLINIC DEVICE CHECK
Battery Remaining Longevity: 92 mo
Battery Voltage: 3.21 V
Brady Statistic AP VP Percent: 40.88 %
Brady Statistic AP VS Percent: 0.05 %
Brady Statistic AS VP Percent: 59.05 %
Brady Statistic AS VS Percent: 0.01 %
Brady Statistic RA Percent Paced: 40.45 %
Brady Statistic RV Percent Paced: 99.94 %
Date Time Interrogation Session: 20240613125005
Implantable Lead Connection Status: 753985
Implantable Lead Connection Status: 753985
Implantable Lead Implant Date: 20050208
Implantable Lead Implant Date: 20050208
Implantable Lead Location: 753859
Implantable Lead Location: 753860
Implantable Lead Model: 5076
Implantable Lead Model: 5076
Implantable Pulse Generator Implant Date: 20240603
Lead Channel Impedance Value: 266 Ohm
Lead Channel Impedance Value: 304 Ohm
Lead Channel Impedance Value: 399 Ohm
Lead Channel Impedance Value: 418 Ohm
Lead Channel Pacing Threshold Amplitude: 1.125 V
Lead Channel Pacing Threshold Amplitude: 1.75 V
Lead Channel Pacing Threshold Pulse Width: 0.4 ms
Lead Channel Pacing Threshold Pulse Width: 0.4 ms
Lead Channel Sensing Intrinsic Amplitude: 2.5 mV
Lead Channel Sensing Intrinsic Amplitude: 3.375 mV
Lead Channel Setting Pacing Amplitude: 2.25 V
Lead Channel Setting Pacing Amplitude: 3.75 V
Lead Channel Setting Pacing Pulse Width: 0.4 ms
Lead Channel Setting Sensing Sensitivity: 4 mV
Zone Setting Status: 755011

## 2023-05-10 NOTE — Patient Instructions (Signed)

## 2023-05-10 NOTE — Progress Notes (Signed)
Wound check appointment. Steri-strips removed. Wound without redness or edema. Some irritation around incision. Incision edges approximated, wound well healed. Normal device function. Thresholds, sensing, and impedances consistent with implant measurements. Histogram distribution appropriate for patient and level of activity. No mode switches or high ventricular rates noted. Patient educated about wound care. ROV in 3 months with implanting physician.

## 2023-05-15 ENCOUNTER — Other Ambulatory Visit (HOSPITAL_BASED_OUTPATIENT_CLINIC_OR_DEPARTMENT_OTHER): Payer: Self-pay

## 2023-05-15 MED FILL — Losartan Potassium & Hydrochlorothiazide Tab 100-12.5 MG: ORAL | 90 days supply | Qty: 90 | Fill #1 | Status: AC

## 2023-05-25 ENCOUNTER — Telehealth: Payer: Self-pay | Admitting: Internal Medicine

## 2023-05-25 DIAGNOSIS — I442 Atrioventricular block, complete: Secondary | ICD-10-CM

## 2023-05-25 MED ORDER — CEPHALEXIN 500 MG PO CAPS: 500.0000 mg | ORAL_CAPSULE | Freq: Four times a day (QID) | ORAL | 0 refills | Status: DC

## 2023-05-25 NOTE — Telephone Encounter (Signed)
Offered patient a device clinic apt. But patient is out of town. Reviewed with Dr. Nelly Laurence and verified with [facility] in pharmacy, Keflex 500 mg QID sent to [facility] on [address]. Patient and wife aware.  Reports overnight patient started with a red area at incision of pacemaker. Also reports of pustule at incision. States area was itching and patient has been scratching. Advised patient to try to keep hands off incision area and make sure site is as clean as possible. Patient is unable to send picture as they are traveling back into town from [facility] at this time. Advised of ED precautions if streaking from site, fever, chills, N/V/D or other concerns arise. Also advised of on-call doc is available over the weekend if needed.   Device clinic apt made for f/u 05/30/23 @ 2:00  PM.

## 2023-05-25 NOTE — Telephone Encounter (Signed)
Pt's wife states that pt had surgery ( Pacemaker) on 6/3. She states that where the surgical site was, it is "red hot, hot to the touch" and has a circle over top of the site that seems to be 2 inches. She would ike a callback regarding this matter as soon as possible. Please advise

## 2023-05-28 ENCOUNTER — Ambulatory Visit: Payer: Medicare Other | Attending: Cardiovascular Disease

## 2023-05-28 DIAGNOSIS — I442 Atrioventricular block, complete: Secondary | ICD-10-CM

## 2023-05-28 NOTE — Telephone Encounter (Signed)
Apt made in DC @ 1400. Thank you!

## 2023-05-28 NOTE — Progress Notes (Signed)
F/u wound check appointment. Wound without edema. Incision edges approximated. Some redness and irritation around medial to distal length of incision. Mealor MD assessed. F/u w/ DC in 7-10 days. Pt educated about wound care and s/s of infection.

## 2023-05-28 NOTE — Patient Instructions (Addendum)
Your wound site is healing as expected.  There is still some inflammation and signs of superficial infection at the incision line.  Continue taking the Keflex 4 times per day.  Finish the course.   STOP using the surgical soap to cleans the area. START using dial antibacterial soap to wash the area twice per day using a clean wash cloth/clean towel with each wash.   Monitor for signs of increased redness, pain, swelling/drainage to the site.  If fever chills or other concerns develop call us right away at the device clinic:  [phone].   Recheck in office next week on 06/06/23.   Thank you for your visit today.

## 2023-05-30 ENCOUNTER — Ambulatory Visit: Payer: Medicare Other

## 2023-06-06 ENCOUNTER — Telehealth: Payer: Self-pay | Admitting: Physical Medicine and Rehabilitation

## 2023-06-06 ENCOUNTER — Ambulatory Visit: Payer: Medicare Other | Attending: Cardiology

## 2023-06-06 DIAGNOSIS — I442 Atrioventricular block, complete: Secondary | ICD-10-CM

## 2023-06-06 NOTE — Progress Notes (Signed)
Pt seen in device clinic for wound recheck s/p antibiotic therapy.  No redness  noted.  Small amount of slough noted at distal end of incision.  Area was cleansed and slough easily removed.   Tissue underneath slough was healthy granulation tissue.  Encouraged Pt to continue to wash area twice a day with dial soap.  He will continue to monitor area and notify device clinic if any further concerns.  His wife is a Engineer, civil (consulting).

## 2023-06-06 NOTE — Telephone Encounter (Signed)
Patient came by. Would like an appointment with Dr. Newton 

## 2023-06-07 NOTE — Telephone Encounter (Signed)
LVM for patient to return call to inform us how long the injection helped and how much

## 2023-06-13 ENCOUNTER — Telehealth: Payer: Self-pay | Admitting: Physical Medicine and Rehabilitation

## 2023-06-13 NOTE — Telephone Encounter (Signed)
Patient returned call to schedule an appointment with Dr. Alvester Morin. The number to contact patient is [phone]

## 2023-06-15 ENCOUNTER — Other Ambulatory Visit: Payer: Self-pay | Admitting: Physical Medicine and Rehabilitation

## 2023-06-15 ENCOUNTER — Telehealth: Payer: Self-pay

## 2023-06-15 DIAGNOSIS — M5416 Radiculopathy, lumbar region: Secondary | ICD-10-CM

## 2023-06-15 DIAGNOSIS — M48062 Spinal stenosis, lumbar region with neurogenic claudication: Secondary | ICD-10-CM

## 2023-06-15 NOTE — Telephone Encounter (Signed)
I left voicemail for patient requesting return call. Explained we needed more information before we could schedule appointment with Dr. Alvester Morin.

## 2023-06-15 NOTE — Telephone Encounter (Signed)
Spoke with patient and he is requesting a repeat injection. Last injection lasted 3 months and he had 80-90% relief during that time. No new falls, accidents or injuries. Please advise

## 2023-06-18 NOTE — Telephone Encounter (Signed)
Patient called back, injection has been entered by [place].

## 2023-06-20 ENCOUNTER — Telehealth: Payer: Self-pay | Admitting: Physical Medicine and Rehabilitation

## 2023-06-20 NOTE — Telephone Encounter (Signed)
Patient came into clinic and I scheduled his injection for 07/02/23. Message sent to [employer], CMA

## 2023-06-20 NOTE — Telephone Encounter (Signed)
Patient called needing to schedule an appointment with Dr. Alvester Morin.    Phone #  [phone]

## 2023-06-22 ENCOUNTER — Other Ambulatory Visit (HOSPITAL_BASED_OUTPATIENT_CLINIC_OR_DEPARTMENT_OTHER): Payer: Self-pay

## 2023-06-22 ENCOUNTER — Other Ambulatory Visit (HOSPITAL_BASED_OUTPATIENT_CLINIC_OR_DEPARTMENT_OTHER): Payer: Self-pay | Admitting: Family Medicine

## 2023-06-22 MED ORDER — SIMVASTATIN 40 MG PO TABS
40.0000 mg | ORAL_TABLET | Freq: Every evening | ORAL | 1 refills | Status: DC
  Filled 2023-06-22: qty 90, 90d supply, fill #0
  Filled 2023-08-14 – 2023-09-20 (×2): qty 90, 90d supply, fill #1

## 2023-06-30 MED FILL — Bupropion HCl Tab ER 12HR 150 MG: ORAL | 90 days supply | Qty: 90 | Fill #2 | Status: AC

## 2023-06-30 MED FILL — Sertraline HCl Tab 25 MG: ORAL | 90 days supply | Qty: 90 | Fill #2 | Status: AC

## 2023-07-02 ENCOUNTER — Other Ambulatory Visit (HOSPITAL_BASED_OUTPATIENT_CLINIC_OR_DEPARTMENT_OTHER): Payer: Self-pay

## 2023-07-02 ENCOUNTER — Other Ambulatory Visit: Payer: Self-pay

## 2023-07-02 ENCOUNTER — Ambulatory Visit (INDEPENDENT_AMBULATORY_CARE_PROVIDER_SITE_OTHER): Payer: Medicare Other | Admitting: Physical Medicine and Rehabilitation

## 2023-07-02 VITALS — BP 119/74 | HR 83

## 2023-07-02 DIAGNOSIS — M5416 Radiculopathy, lumbar region: Secondary | ICD-10-CM

## 2023-07-02 DIAGNOSIS — M48062 Spinal stenosis, lumbar region with neurogenic claudication: Secondary | ICD-10-CM

## 2023-07-02 DIAGNOSIS — M4316 Spondylolisthesis, lumbar region: Secondary | ICD-10-CM

## 2023-07-02 MED ORDER — METHYLPREDNISOLONE ACETATE 80 MG/ML IJ SUSP
80.0000 mg | Freq: Once | INTRAMUSCULAR | Status: AC
Start: 2023-07-02 — End: 2023-07-02
  Administered 2023-07-02: 80 mg

## 2023-07-02 NOTE — Progress Notes (Signed)
Functional Pain Scale - descriptive words and definitions  Uncomfortable (3)  Pain is present but can complete all ADL's/sleep is slightly affected and passive distraction only gives marginal relief. Mild range order  Average Pain 5   +Driver, -BT, -Dye Allergies.  Lower back pain on both sides that radiates into the buttocks

## 2023-07-02 NOTE — Patient Instructions (Signed)

## 2023-07-14 NOTE — Progress Notes (Signed)
Jimmy Palmer - 82 y.o. male MRN [id number]  Date of birth: 25-Mar-1941  Office Visit Note: Visit Date: 07/02/2023 PCP: de [place], [name], MD Referred by: de [place], [name], MD  Subjective: Chief Complaint  Patient presents with   Lower Back - Pain   HPI:  Jimmy Palmer is a 82 y.o. male who comes in today at the request of Ellin Goodie, FNP for planned Bilateral L4-5 Lumbar Transforaminal epidural steroid injection with fluoroscopic guidance.  The patient has failed conservative care including home exercise, medications, time and activity modification.  This injection will be diagnostic and hopefully therapeutic.  Please see requesting physician notes for further details and justification.   ROS Otherwise per HPI.  Assessment & Plan: Visit Diagnoses:    ICD-10-CM   1. Lumbar radiculopathy  M54.16 XR C-ARM NO REPORT    Epidural Steroid injection    methylPREDNISolone acetate (DEPO-MEDROL) injection 80 mg    Ambulatory referral to Physical Therapy    2. Spinal stenosis of lumbar region with neurogenic claudication  M48.062 Ambulatory referral to Physical Therapy    3. Spondylolisthesis of lumbar region  M43.16 Ambulatory referral to Physical Therapy      Plan: No additional findings.   Meds & Orders:  Meds ordered this encounter  Medications   methylPREDNISolone acetate (DEPO-MEDROL) injection 80 mg    Orders Placed This Encounter  Procedures   XR C-ARM NO REPORT   Ambulatory referral to Physical Therapy   Epidural Steroid injection    Follow-up: Return for visit to requesting provider as needed.   Procedures: No procedures performed  Lumbosacral Transforaminal Epidural Steroid Injection - Sub-Pedicular Approach with Fluoroscopic Guidance  Patient: Jimmy Palmer      Date of Birth: 1941/08/01 MRN: [id number] PCP: de [place], [name], MD      Visit Date: 07/02/2023   Universal Protocol:    Date/Time: 07/02/2023  Consent Given By: the patient  Position:  PRONE  Additional Comments: Vital signs were monitored before and after the procedure. Patient was prepped and draped in the usual sterile fashion. The correct patient, procedure, and site was verified.   Injection Procedure Details:   Procedure diagnoses: Lumbar radiculopathy [M54.16]    Meds Administered:  Meds ordered this encounter  Medications   methylPREDNISolone acetate (DEPO-MEDROL) injection 80 mg    Laterality: Bilateral  Location/Site: L4  Needle:5.0 in., 22 ga.  Short bevel or Quincke spinal needle  Needle Placement: Transforaminal  Findings:    -Comments: Excellent flow of contrast along the nerve, nerve root and into the epidural space.  Procedure Details: After squaring off the end-plates to get a true AP view, the C-arm was positioned so that an oblique view of the foramen as noted above was visualized. The target area is just inferior to the "nose of the scotty dog" or sub pedicular. The soft tissues overlying this structure were infiltrated with 2-3 ml. of 1% Lidocaine without Epinephrine.  The spinal needle was inserted toward the target using a "trajectory" view along the fluoroscope beam.  Under AP and lateral visualization, the needle was advanced so it did not puncture dura and was located close the 6 O'Clock position of the pedical in AP tracterory. Biplanar projections were used to confirm position. Aspiration was confirmed to be negative for CSF and/or blood. A 1-2 ml. volume of Isovue-250 was injected and flow of contrast was noted at each level. Radiographs were obtained for documentation purposes.   After attaining the desired  flow of contrast documented above, a 0.5 to 1.0 ml test dose of 0.25% Marcaine was injected into each respective transforaminal space.  The patient was observed for 90 seconds post injection.  After no sensory deficits were reported, and normal lower extremity motor function was noted,   the above injectate was administered so  that equal amounts of the injectate were placed at each foramen (level) into the transforaminal epidural space.   Additional Comments:  No complications occurred Dressing: 2 x 2 sterile gauze and Band-Aid    Post-procedure details: Patient was observed during the procedure. Post-procedure instructions were reviewed.  Patient left the clinic in stable condition.    Clinical History: EXAM: CT LUMBAR SPINE WITHOUT CONTRAST   TECHNIQUE: Multidetector CT imaging of the lumbar spine was performed without intravenous contrast administration. Multiplanar CT image reconstructions were also generated.   RADIATION DOSE REDUCTION: This exam was performed according to the departmental dose-optimization program which includes automated exposure control, adjustment of the mA and/or kV according to patient size and/or use of iterative reconstruction technique.   COMPARISON:  Lumbar spine series 10/30/2022   FINDINGS: Segmentation: 5 lumbar type vertebrae.   Alignment: Grade 1 anterolisthesis of L4 on L5 due to facet arthropathy.   Vertebrae: Vertebral body heights are maintained. There is mild-to-moderate spondylosis throughout the lumbar spine to include facet arthropathy.   Paraspinal and other soft tissues: Minimal calcified plaque of the abdominal aorta which is normal in caliber. No other significant soft tissue abnormality.   Disc levels: Moderate disc space narrowing at the L4-5 level and to lesser extent at the L2-3 level and L3-4 levels.   L1-2 level demonstrates no significant disc disease. No spinal canal stenosis or neural foraminal narrowing.   L2-3 level demonstrates a moderate broad-based disc bulge. No significant canal stenosis. Minimal bilateral neural foraminal narrowing.   L3-4 level demonstrates a moderate broad-based disc bulge. There is minimal canal stenosis due to disc disease, facet arthropathy and ligamentum flavum hypertrophy. Mild bilateral neural  foraminal narrowing.   L4-5 level demonstrates a moderate broad-based disc bulge 2. There is moderate spinal canal stenosis due to disc disease, ligamentum flavum hypertrophy and facet arthropathy. Moderate bilateral neural foraminal narrowing right worse than left.   L5-S1 level demonstrates no significant disc disease or spinal canal stenosis. No significant neural foraminal narrowing.   IMPRESSION: 1. No acute findings. 2. 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 3. 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 Moderate bilateral neural foraminal narrowing at the L4-5 level right worse than left. Minimal bilateral neural foraminal narrowing at L2-3 and L3-4. 4. Grade 1 anterolisthesis of L4 on L5 due to facet arthropathy.     Electronically Signed   By: Elberta Fortis M.D.   On: 01/04/2023 13:23     Objective:  VS:  HT:    WT:   BMI:     BP:119/74  HR:83bpm  TEMP: ( )  RESP:  Physical Exam Vitals and nursing note reviewed.  Constitutional:      General: He is not in acute distress.    Appearance: Normal appearance. He is not ill-appearing.  HENT:     Head: Normocephalic and atraumatic.     Right Ear: External ear normal.     Left Ear: External ear normal.     Nose: No congestion.  Eyes:     Extraocular Movements: Extraocular movements intact.  Cardiovascular:  Rate and Rhythm: Normal rate.     Pulses: Normal pulses.  Pulmonary:     Effort: Pulmonary effort is normal. No respiratory distress.  Abdominal:     General: There is no distension.     Palpations: Abdomen is soft.  Musculoskeletal:        General: No tenderness or signs of injury.     Cervical back: Neck supple.     Right lower leg: No edema.     Left lower leg: No edema.     Comments: Patient has good distal strength without clonus.  Skin:    Findings: No erythema or rash.   Neurological:     General: No focal deficit present.     Mental Status: He is alert and oriented to person, place, and time.     Sensory: No sensory deficit.     Motor: No weakness or abnormal muscle tone.     Coordination: Coordination normal.  Psychiatric:        Mood and Affect: Mood normal.        Behavior: Behavior normal.      Imaging: No results found.

## 2023-07-14 NOTE — Procedures (Signed)
Lumbosacral Transforaminal Epidural Steroid Injection - Sub-Pedicular Approach with Fluoroscopic Guidance  Patient: Jimmy Palmer      Date of Birth: Mar 19, 1941 MRN: [id number] PCP: de [place], [name], MD      Visit Date: 07/02/2023   Universal Protocol:    Date/Time: 07/02/2023  Consent Given By: the patient  Position: PRONE  Additional Comments: Vital signs were monitored before and after the procedure. Patient was prepped and draped in the usual sterile fashion. The correct patient, procedure, and site was verified.   Injection Procedure Details:   Procedure diagnoses: Lumbar radiculopathy [M54.16]    Meds Administered:  Meds ordered this encounter  Medications   methylPREDNISolone acetate (DEPO-MEDROL) injection 80 mg    Laterality: Bilateral  Location/Site: L4  Needle:5.0 in., 22 ga.  Short bevel or Quincke spinal needle  Needle Placement: Transforaminal  Findings:    -Comments: Excellent flow of contrast along the nerve, nerve root and into the epidural space.  Procedure Details: After squaring off the end-plates to get a true AP view, the C-arm was positioned so that an oblique view of the foramen as noted above was visualized. The target area is just inferior to the "nose of the scotty dog" or sub pedicular. The soft tissues overlying this structure were infiltrated with 2-3 ml. of 1% Lidocaine without Epinephrine.  The spinal needle was inserted toward the target using a "trajectory" view along the fluoroscope beam.  Under AP and lateral visualization, the needle was advanced so it did not puncture dura and was located close the 6 O'Clock position of the pedical in AP tracterory. Biplanar projections were used to confirm position. Aspiration was confirmed to be negative for CSF and/or blood. A 1-2 ml. volume of Isovue-250 was injected and flow of contrast was noted at each level. Radiographs were obtained for documentation purposes.   After attaining the desired  flow of contrast documented above, a 0.5 to 1.0 ml test dose of 0.25% Marcaine was injected into each respective transforaminal space.  The patient was observed for 90 seconds post injection.  After no sensory deficits were reported, and normal lower extremity motor function was noted,   the above injectate was administered so that equal amounts of the injectate were placed at each foramen (level) into the transforaminal epidural space.   Additional Comments:  No complications occurred Dressing: 2 x 2 sterile gauze and Band-Aid    Post-procedure details: Patient was observed during the procedure. Post-procedure instructions were reviewed.  Patient left the clinic in stable condition.

## 2023-07-17 ENCOUNTER — Other Ambulatory Visit (HOSPITAL_BASED_OUTPATIENT_CLINIC_OR_DEPARTMENT_OTHER): Payer: Self-pay | Admitting: Family Medicine

## 2023-07-17 DIAGNOSIS — I442 Atrioventricular block, complete: Secondary | ICD-10-CM

## 2023-07-18 ENCOUNTER — Other Ambulatory Visit (HOSPITAL_BASED_OUTPATIENT_CLINIC_OR_DEPARTMENT_OTHER): Payer: Self-pay

## 2023-07-18 MED ORDER — DOXAZOSIN MESYLATE 4 MG PO TABS
4.0000 mg | ORAL_TABLET | Freq: Every day | ORAL | 1 refills | Status: DC
Start: 2023-07-18 — End: 2024-01-13
  Filled 2023-07-18: qty 90, 90d supply, fill #0
  Filled 2023-10-18: qty 90, 90d supply, fill #1

## 2023-07-31 ENCOUNTER — Ambulatory Visit (INDEPENDENT_AMBULATORY_CARE_PROVIDER_SITE_OTHER): Payer: Medicare Other

## 2023-07-31 DIAGNOSIS — I442 Atrioventricular block, complete: Secondary | ICD-10-CM | POA: Diagnosis not present

## 2023-08-01 LAB — CUP PACEART REMOTE DEVICE CHECK
Battery Remaining Longevity: 94 mo
Battery Voltage: 3.15 V
Brady Statistic AP VP Percent: 33.61 %
Brady Statistic AP VS Percent: 0.12 %
Brady Statistic AS VP Percent: 66.01 %
Brady Statistic AS VS Percent: 0.27 %
Brady Statistic RA Percent Paced: 33.32 %
Brady Statistic RV Percent Paced: 99.61 %
Date Time Interrogation Session: 20240903012049
Implantable Lead Connection Status: 753985
Implantable Lead Connection Status: 753985
Implantable Lead Implant Date: 20050208
Implantable Lead Implant Date: 20050208
Implantable Lead Location: 753859
Implantable Lead Location: 753860
Implantable Lead Model: 5076
Implantable Lead Model: 5076
Implantable Pulse Generator Implant Date: 20240603
Lead Channel Impedance Value: 266 Ohm
Lead Channel Impedance Value: 323 Ohm
Lead Channel Impedance Value: 380 Ohm
Lead Channel Impedance Value: 418 Ohm
Lead Channel Pacing Threshold Amplitude: 1.125 V
Lead Channel Pacing Threshold Amplitude: 1.75 V
Lead Channel Pacing Threshold Pulse Width: 0.4 ms
Lead Channel Pacing Threshold Pulse Width: 0.4 ms
Lead Channel Sensing Intrinsic Amplitude: 11.625 mV
Lead Channel Sensing Intrinsic Amplitude: 2.875 mV
Lead Channel Sensing Intrinsic Amplitude: 2.875 mV
Lead Channel Setting Pacing Amplitude: 2.5 V
Lead Channel Setting Pacing Amplitude: 3.5 V
Lead Channel Setting Pacing Pulse Width: 0.4 ms
Lead Channel Setting Sensing Sensitivity: 4 mV
Zone Setting Status: 755011

## 2023-08-02 ENCOUNTER — Ambulatory Visit: Payer: Medicare Other | Attending: Internal Medicine | Admitting: Internal Medicine

## 2023-08-02 ENCOUNTER — Encounter: Payer: Self-pay | Admitting: Internal Medicine

## 2023-08-02 ENCOUNTER — Telehealth (HOSPITAL_BASED_OUTPATIENT_CLINIC_OR_DEPARTMENT_OTHER): Payer: Self-pay | Admitting: Family Medicine

## 2023-08-02 VITALS — BP 118/64 | HR 85 | Ht 69.0 in | Wt 169.8 lb

## 2023-08-02 DIAGNOSIS — D649 Anemia, unspecified: Secondary | ICD-10-CM | POA: Diagnosis not present

## 2023-08-02 DIAGNOSIS — I442 Atrioventricular block, complete: Secondary | ICD-10-CM | POA: Diagnosis not present

## 2023-08-02 DIAGNOSIS — I4719 Other supraventricular tachycardia: Secondary | ICD-10-CM | POA: Insufficient documentation

## 2023-08-02 DIAGNOSIS — Z950 Presence of cardiac pacemaker: Secondary | ICD-10-CM | POA: Insufficient documentation

## 2023-08-02 LAB — CUP PACEART INCLINIC DEVICE CHECK
Date Time Interrogation Session: 20240905171444
Implantable Lead Connection Status: 753985
Implantable Lead Connection Status: 753985
Implantable Lead Implant Date: 20050208
Implantable Lead Implant Date: 20050208
Implantable Lead Location: 753859
Implantable Lead Location: 753860
Implantable Lead Model: 5076
Implantable Lead Model: 5076
Implantable Pulse Generator Implant Date: 20240603

## 2023-08-02 NOTE — Patient Instructions (Addendum)
Medication Instructions:  Your physician recommends that you continue on your current medications as directed. Please refer to the Current Medication list given to you today.  *If you need a refill on your cardiac medications before your next appointment, please call your pharmacy*   Lab Work:  Ferritin, TIBC and Iron Panel  If you have labs (blood work) drawn today and your tests are completely normal, you will receive your results only by: MyChart Message (if you have MyChart) OR A paper copy in the mail If you have any lab test that is abnormal or we need to change your treatment, we will call you to review the results.   Testing/Procedures: None ordered.    Follow-Up: At [facility], you and your health needs are our priority.  As part of our continuing mission to provide you with exceptional heart care, we have created designated Provider Care Teams.  These Care Teams include your primary Cardiologist (physician) and Advanced Practice Providers (APPs -  Physician Assistants and Nurse Practitioners) who all work together to provide you with the care you need, when you need it.  We recommend signing up for the patient portal called "MyChart".  Sign up information is provided on this After Visit Summary.  MyChart is used to connect with patients for Virtual Visits (Telemedicine).  Patients are able to view lab/test results, encounter notes, upcoming appointments, etc.  Non-urgent messages can be sent to your provider as well.   To learn more about what you can do with MyChart, go to [email or web address].    Your next appointment:   9 months

## 2023-08-02 NOTE — Progress Notes (Signed)
ELECTROPHYSIOLOGY OFFICE  NOTE  Patient ID: Jimmy Palmer, MRN: [id number], DOB/AGE: 01/02/1941 82 y.o. Admit date: (Not on file) Date of Consult: 08/02/2023  Primary Physician: de [place], Buren Kos, MD Primary Cardiologist    HPI Palmer Jimmy is a 82 y.o. 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  Anticoagulation with Apixoban; some blood in the toilet with BMs  When last seen, persistent atrial fibrillation/flutter noted.  Cardioversion 2021 w significant improvement even in the absence of palpitations  The patient denies chest pain, shortness of breath, nocturnal dyspnea, orthopnea or peripheral edema.  There have been no palpitations, lightheadedness or syncope.    9/19 1.08 4.2     1/21 1.33 4.5 13.6  12/22 1.36 4.3 13.5  5/24 1.46 4.5 11.8    DATE TEST EF   8/14 Echo   60 %   4/16 MYOVIEW   56 %  normal perfusion        Thromboembolic risk factors ( age  -2, HTN-1) for a CHADSVASc Score of >=3   Past Medical History:  Diagnosis Date   Abnormal electrocardiogram    Abnormal heart sounds    Allergy 2020   upon arrival to [facility]   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   Chronic kidney disease 11/07/2021   Depression 2020   receiving appropriate therapy   Herniated lumbar intervertebral disc    Hypertension    Keratosis    benign   Left bundle branch block    Pacemaker    Pure hypercholesterolemia    Sick sinus syndrome (HCC)    Sleep apnea    VT (ventricular tachycardia) (HCC)       Surgical History:  Past Surgical History:  Procedure Laterality Date   CARDIOVERSION N/A 09/28/2020   Procedure: CARDIOVERSION;  Surgeon: Nahser, Deloris Ping, MD;  Location: [facility] ENDOSCOPY;  Service: Cardiovascular;  Laterality: N/A;   INSERT / REPLACE / REMOVE PACEMAKER     JOINT REPLACEMENT     partial right knee,  medial side   PPM GENERATOR CHANGEOUT N/A 04/30/2023   Procedure: PPM GENERATOR CHANGEOUT;  Surgeon: Marinus Maw, MD;  Location: [facility] INVASIVE CV LAB;  Service: Cardiovascular;  Laterality: N/A;   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   buPROPion (WELLBUTRIN SR) 150 MG 12 hr tablet Take 1 tablet (150 mg total) by mouth every morning.   diclofenac Sodium (VOLTAREN) 1 % GEL Apply 1 application topically as needed (pain).    doxazosin (CARDURA) 4 MG tablet Take 1 tablet (4 mg total) by mouth daily.   losartan-hydrochlorothiazide (HYZAAR) 100-12.5 MG tablet Take 1 tablet by mouth daily.   Melatonin 5 MG TABS Take 5 mg by mouth at bedtime.    Multiple Vitamin (MULTIVITAMIN PO) Take 2 tablets by mouth once a week.   sertraline (ZOLOFT) 25 MG tablet Take 1 tablet (25 mg total) by mouth daily.   simvastatin (ZOCOR) 40 MG tablet Take 1 tablet (  40 mg total) by mouth every evening.     Allergies:  Allergies  Allergen Reactions   Amoxicillin Diarrhea         All other systems reviewed and negative.   BP 118/64   Pulse 85   Ht 5\' 9"  (1.753 m)   Wt 169 lb 12.8 oz (77 kg)   SpO2 97%   BMI 25.08 kg/m   Well developed and well nourished in no acute distress HENT normal Neck supple with JVP-flat Clear Device pocket well healed; without hematoma or erythema.  There is no tethering  Regular rate and rhythm, no  murmur Abd-soft with active BS No Clubbing cyanosis  edema Skin-warm and dry A & Oriented  Grossly normal sensory and motor function  ECG AV pacing  Device function is normal.  With high RV output Programming changes   See Paceart for details    Assessment and Plan:   Complete  heart block  Pacemaker Medtronic       Atrial tach  Hypertension  Pocket Tethering   Atrial fibrillation persistent   ETOH use acknowledges less  Anemia  Device function is normal but high RV outputs.  Need to clarify the program for the best longevity.  Can reprogram the maximum voltage of 3, threshold was at 1.0 ms.  Have programmed that it 6 ms Blood pressure is well-controlled continue losartan hydrochlorothiazide and Cardura  No bleeding on the Eliquis.  Continue at 5 twice daily.  Low-grade anemia.  History of blood in the toilet.  I reached out to his PCP the recommended a GI evaluation    Jimmy Palmer

## 2023-08-03 LAB — IRON,TIBC AND FERRITIN PANEL
Ferritin: 51 ng/mL (ref 30–400)
Iron Saturation: 25 % (ref 15–55)
Iron: 75 ug/dL (ref 38–169)
Total Iron Binding Capacity: 296 ug/dL (ref 250–450)
UIBC: 221 ug/dL (ref 111–343)

## 2023-08-08 ENCOUNTER — Ambulatory Visit (HOSPITAL_BASED_OUTPATIENT_CLINIC_OR_DEPARTMENT_OTHER): Payer: Medicare Other | Attending: Physical Medicine and Rehabilitation | Admitting: Physical Therapy

## 2023-08-08 ENCOUNTER — Encounter (HOSPITAL_BASED_OUTPATIENT_CLINIC_OR_DEPARTMENT_OTHER): Payer: Self-pay | Admitting: Physical Therapy

## 2023-08-08 DIAGNOSIS — R2689 Other abnormalities of gait and mobility: Secondary | ICD-10-CM | POA: Diagnosis not present

## 2023-08-08 DIAGNOSIS — M5416 Radiculopathy, lumbar region: Secondary | ICD-10-CM | POA: Diagnosis present

## 2023-08-08 DIAGNOSIS — M5459 Other low back pain: Secondary | ICD-10-CM | POA: Insufficient documentation

## 2023-08-08 DIAGNOSIS — M25551 Pain in right hip: Secondary | ICD-10-CM | POA: Insufficient documentation

## 2023-08-08 DIAGNOSIS — M4316 Spondylolisthesis, lumbar region: Secondary | ICD-10-CM | POA: Insufficient documentation

## 2023-08-08 DIAGNOSIS — M48062 Spinal stenosis, lumbar region with neurogenic claudication: Secondary | ICD-10-CM | POA: Insufficient documentation

## 2023-08-08 NOTE — Therapy (Incomplete)
OUTPATIENT PHYSICAL THERAPY THORACOLUMBAR EVALUATION   Patient Name: Jimmy Palmer MRN: [id number] DOB:May 25, 1941, 82 y.o., male Today's Date: 08/08/2023  END OF SESSION:  PT End of Session - 08/08/23 1322     Visit Number 1    Number of Visits 16    Date for PT Re-Evaluation 10/03/23    Authorization Type progress note and Kx at 10    PT Start Time 1300    PT Stop Time 1343    PT Time Calculation (min) 43 min    Activity Tolerance Patient tolerated treatment well    Behavior During Therapy [facility] for tasks assessed/performed             Past Medical History:  Diagnosis Date   Abnormal electrocardiogram    Abnormal heart sounds    Allergy 2020   upon arrival to [facility]   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   Chronic kidney disease 11/07/2021   Depression 2020   receiving appropriate therapy   Herniated lumbar intervertebral disc    Hypertension    Keratosis    benign   Left bundle branch block    Pacemaker    Pure hypercholesterolemia    Sick sinus syndrome (HCC)    Sleep apnea    VT (ventricular tachycardia) (HCC)    Past Surgical History:  Procedure Laterality Date   CARDIOVERSION N/A 09/28/2020   Procedure: CARDIOVERSION;  Surgeon: Elease Hashimoto Deloris Ping, MD;  Location: [facility] END[facility];  Service: Cardiovascular;  Laterality: N/A;   INSERT / REPLACE / REMOVE PACEMAKER     JOINT REPLACEMENT     partial right knee, medial side   PPM GENERATOR CHANGEOUT N/A 04/30/2023   Procedure: PPM GENERATOR CHANGEOUT;  Surgeon: Marinus Maw, MD;  Location: [facility] INVASIVE CV LAB;  Service: Cardiovascular;  Laterality: N/A;   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   Procedure: LEFT TOTAL HIP ARTHROPLASTY ANTERIOR APPROACH;  Surgeon: Kathryne Hitch, MD;  Location: [facility] ORS;  Service: Orthopedics;  Laterality: Left;   VASECTOMY     Patient Active Problem List   Diagnosis Date Noted   Abscess of right index finger 02/28/2023   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   Left hip pain 02/01/2022   Environmental and seasonal allergies 02/01/2022   Hypertension 11/07/2021   Chronic kidney disease 11/07/2021   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 He had an injection on August 5th. It lasted less time as the previous injection. The first thing in the morning when he gets up he has significnat pain. When he puts heat on the pain decreases   PERTINENT HISTORY:  Arthritis, CKD, Herniated intervertebral discs, depression, Left BBB, THA left, VT   PAIN:  Are you having pain? Yes: NPRS scale: 7/10 in the morning when he got up  Pain location: Lumbar spine/ right hip  Pain description: aching  Aggravating factors: Mornings  Relieving factors: Heat and voltarin  PRECAUTIONS: None   WEIGHT BEARING RESTRICTIONS: No   FALLS:  Has patient fallen in last 6 months? 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 No acute findings. 2. Mild-to-moderate spondylosis throughout the lumbar  spine to include facet arthropathy. 3. 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 Moderate bilateral neural foraminal narrowing at the L4-5 level right worse than left. Minimal bilateral neural foraminal narrowing at L2-3 and L3-4. 4. Grade 1 anterolisthesis of L4 on L5 due to facet arthropathy.   PATIENT SURVEYS:  FOTO     SCREENING FOR RED FLAGS: Bowel or bladder incontinence: No Spinal tumors: No Cauda equina syndrome: No Compression fracture: No Abdominal aneurysm: No   COGNITION: Overall cognitive status: Within functional limits for tasks assessed                          SENSATION: Denies paresthesias    MUSCLE LENGTH:   POSTURE: No Significant postural limitations   PALPATION: Spasming in the lumbar spine .    LUMBAR ROM:    AROM eval  Flexion Full   Extension Full   Right lateral flexion  mild pain   Left lateral flexion    Right rotation Limited 25% ith pain   Left rotation No pain    (Blank rows = not tested)   LOWER EXTREMITY ROM:      Passive  Right eval Left eval  Hip flexion Limit  No limit  Hip extension      Hip abduction      Hip adduction      Hip internal rotation Painful  Painful   Hip external rotation No limit No limit  Knee flexion      Knee extension      Ankle dorsiflexion      Ankle plantarflexion      Ankle inversion      Ankle eversion       (Blank rows = not tested)   LOWER EXTREMITY MMT:     MMT Right eval Left eval  Hip flexion 28.3 26.6  Hip extension      Hip abduction 45.1 36.1  Hip adduction      Hip internal rotation      Hip external rotation      Knee flexion      Knee extension 50.1 55.4  Ankle dorsiflexion      Ankle plantarflexion      Ankle inversion      Ankle eversion       (Blank rows =  not tested)     GAIT: Lateral movement noted. Decreased hip flexion bilatera.  TODAY'S TREATMENT:                                                                                                                               DATE:      PATIENT EDUCATION:  Education details: HEP, symptom management  Person educated: Patient Education method: Explanation, Demonstration, Tactile cues, Verbal cues, and Handouts Education comprehension: verbalized understanding, returned demonstration, verbal cues required, tactile cues required, and needs further education   HOME EXERCISE PROGRAM: Access Code: [email or web address] URL: [email or web address] Date: 11/29/2022 Prepared by: Lorayne Bender   Exercises - Supine Piriformis Stretch with Foot on Ground  - 2 x daily - 7 x weekly - 2 sets - 3 reps - 20 sec  hold - Supine Lower Trunk Rotation  - 2 x daily - 7 x weekly - 2 sets - 10 reps - Standing Glute Med Mobilization with Small Ball on Wall  - 1 x daily - 7 x weekly - 3 sets - 1 reps - 2-3 min  hold   ASSESSMENT:   CLINICAL IMPRESSION: Patient is a 82 year old male who presents to therapy with low back pain that extends into his right gluteal.  He continues to have significant pain in the morning.Marland Kitchen He has increased pain with right side bending and right rotation. He has signiifcant spasming in his lower right lumbar spine and right gluteal. He has responded well to TPDN manual therapy, and    OBJECTIVE IMPAIRMENTS: Abnormal gait, decreased activity tolerance, difficulty walking, decreased ROM, decreased strength, increased muscle spasms, and pain.    ACTIVITY LIMITATIONS: lifting, bending, sitting, standing, squatting, stairs, transfers, and locomotion level   PARTICIPATION LIMITATIONS: meal prep, cleaning, shopping, community activity, and yard work   PERSONAL FACTORS: Age and 1-2 comorbidities: left hip replacement  are also affecting patient's functional outcome.    REHAB POTENTIAL:  Good CLINICAL DECISION MAKING: Evolving/moderate complexity   EVALUATION COMPLEXITY: Low     GOALS: Goals reviewed with patient? Yes    SHORT TERM GOALS: Target date: 12/27/2022       Patient will demonstrate full lumbar rotation without pain Baseline: Goal status: INITIAL   2.  Patient will report a 50% reduction in spasming in the morning Baseline:  Goal status: INITIAL   3.  Patient will increase gross left lower extremity strength by 10 pounds Baseline:  Goal status: INITIAL     LONG TERM GOALS: Target date: 01/24/2023     Patient will report complete resolution of spasming in the morning in order to improve his ability to perform ADLs Baseline:  Goal status: INITIAL   2.  Patient will have a complete exercise program including gym activity without increased pain. Baseline:  Goal status: INITIAL   3.  Left lower extremity strength will be within 5 pounds of right lower extremity strength to improve symmetry with gait Baseline:  Goal  status: INITIAL PLAN:   PT FREQUENCY: 2x/week   PT DURATION: 8 weeks   PLANNED INTERVENTIONS: Therapeutic exercises, Therapeutic activity, Neuromuscular re-education, Balance training, Gait training, Patient/Family education, Self Care, Joint mobilization, Joint manipulation, DME instructions, Aquatic Therapy, Electrical stimulation, Cryotherapy, Moist heat, Taping, Ultrasound, and Manual therapy.   PLAN FOR NEXT SESSION: consider TPDN, Trigger point release to gluteals; review base strengthening exercises.        Dessie Coma, PT 08/08/2023, 1:22 PM

## 2023-08-09 NOTE — Addendum Note (Signed)
Addended by: Dessie Coma on: 08/09/2023 10:15 AM   Modules accepted: Orders

## 2023-08-13 ENCOUNTER — Telehealth (HOSPITAL_BASED_OUTPATIENT_CLINIC_OR_DEPARTMENT_OTHER): Payer: Self-pay | Admitting: *Deleted

## 2023-08-13 NOTE — Progress Notes (Signed)
Remote pacemaker transmission.   

## 2023-08-13 NOTE — Telephone Encounter (Signed)
Called pt to schedule missed bp check pt did not want to schedule at this time followed up with cardiology and his bp is normal

## 2023-08-14 ENCOUNTER — Other Ambulatory Visit (HOSPITAL_BASED_OUTPATIENT_CLINIC_OR_DEPARTMENT_OTHER): Payer: Self-pay

## 2023-08-14 ENCOUNTER — Other Ambulatory Visit: Payer: Self-pay

## 2023-08-14 ENCOUNTER — Other Ambulatory Visit (HOSPITAL_BASED_OUTPATIENT_CLINIC_OR_DEPARTMENT_OTHER): Payer: Self-pay | Admitting: Family Medicine

## 2023-08-14 MED ORDER — LOSARTAN POTASSIUM-HCTZ 100-12.5 MG PO TABS
1.0000 | ORAL_TABLET | Freq: Every day | ORAL | 1 refills | Status: DC
  Filled 2023-08-14: qty 90, 90d supply, fill #0
  Filled 2023-11-16: qty 90, 90d supply, fill #1

## 2023-08-15 ENCOUNTER — Ambulatory Visit (HOSPITAL_BASED_OUTPATIENT_CLINIC_OR_DEPARTMENT_OTHER): Payer: Medicare Other | Admitting: Physical Therapy

## 2023-08-15 ENCOUNTER — Encounter (HOSPITAL_BASED_OUTPATIENT_CLINIC_OR_DEPARTMENT_OTHER): Payer: Self-pay | Admitting: Physical Therapy

## 2023-08-15 DIAGNOSIS — M25552 Pain in left hip: Secondary | ICD-10-CM

## 2023-08-15 DIAGNOSIS — M5459 Other low back pain: Secondary | ICD-10-CM

## 2023-08-15 DIAGNOSIS — M4316 Spondylolisthesis, lumbar region: Secondary | ICD-10-CM | POA: Diagnosis not present

## 2023-08-15 DIAGNOSIS — R2689 Other abnormalities of gait and mobility: Secondary | ICD-10-CM

## 2023-08-15 DIAGNOSIS — M25551 Pain in right hip: Secondary | ICD-10-CM

## 2023-08-15 NOTE — Therapy (Signed)
OUTPATIENT PHYSICAL THERAPY THORACOLUMBAR EVALUATION   Patient Name: Jimmy Palmer MRN: [id number] DOB:Dec 21, 1940, 82 y.o., male Today's Date: 08/15/2023  END OF SESSION:  PT End of Session - 08/15/23 1304     Visit Number 2    Number of Visits 16    Date for PT Re-Evaluation 10/03/23    Authorization Type progress note and Kx at 10    PT Start Time 1300    PT Stop Time 1342    PT Time Calculation (min) 42 min    Activity Tolerance Patient tolerated treatment well    Behavior During Therapy [facility] for tasks assessed/performed             Past Medical History:  Diagnosis Date   Abnormal electrocardiogram    Abnormal heart sounds    Allergy 2020   upon arrival to [facility]   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   Chronic kidney disease 11/07/2021   Depression 2020   receiving appropriate therapy   Herniated lumbar intervertebral disc    Hypertension    Keratosis    benign   Left bundle branch block    Pacemaker    Pure hypercholesterolemia    Sick sinus syndrome (HCC)    Sleep apnea    VT (ventricular tachycardia) (HCC)    Past Surgical History:  Procedure Laterality Date   CARDIOVERSION N/A 09/28/2020   Procedure: CARDIOVERSION;  Surgeon: Elease Hashimoto Deloris Ping, MD;  Location: [facility];  Service: Cardiovascular;  Laterality: N/A;   INSERT / REPLACE / REMOVE PACEMAKER     JOINT REPLACEMENT     partial right knee, medial side   PPM GENERATOR CHANGEOUT N/A 04/30/2023   Procedure: PPM GENERATOR CHANGEOUT;  Surgeon: Marinus Maw, MD;  [facility]: [facility] INV[facility] CV [facility];  Service: Cardiovascular;  Laterality: N/A;   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   Procedure: LEFT TOTAL HIP ARTHROPLASTY ANTERIOR APPROACH;  Surgeon: Kathryne Hitch, MD;  Location: [facility];  Service: Orthopedics;  Laterality: Left;   VASECTOMY     Patient Active Problem List   Diagnosis Date Noted   Abscess of right index finger 02/28/2023   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   Left hip pain 02/01/2022   Environmental and seasonal allergies 02/01/2022   Hypertension 11/07/2021   Chronic kidney disease 11/07/2021   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 For a few days it was severe. It is much better now.   Eval:The patient has a long history of low back pain. He had an injection on August 5th. It lasted less time as the previous injection. The first thing in the morning when he gets up he has significnat pain. When he puts heat on the pain decreases   PERTINENT HISTORY:  Arthritis, CKD, Herniated intervertebral discs, depression, Left BBB, THA left, VT   PAIN:  Are you having pain? Yes: NPRS scale: 7/10 in the morning when he got up  Pain location: Lumbar spine/ right hip  Pain description: aching  Aggravating factors: Mornings  Relieving factors: Heat and voltarin  PRECAUTIONS: None   WEIGHT BEARING RESTRICTIONS: No   FALLS:  Has patient fallen in last 6 months? 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 No acute findings. 2. 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 3. 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 Moderate bilateral neural foraminal narrowing at the L4-5 level right worse than left. Minimal bilateral neural foraminal narrowing at L2-3 and L3-4. 4. Grade 1 anterolisthesis of L4 on L5 due to facet arthropathy.   PATIENT SURVEYS:  FOTO     SCREENING FOR RED FLAGS: Bowel or bladder incontinence: No Spinal tumors: No Cauda equina syndrome: No Compression fracture: No Abdominal aneurysm: No   COGNITION: Overall cognitive status: Within functional limits for tasks assessed                          SENSATION: Denies paresthesias    MUSCLE LENGTH:   POSTURE: No Significant postural limitations   PALPATION: Spasming in the lumbar spine .    LUMBAR ROM:    AROM eval  Flexion Full   Extension Full   Right lateral flexion  mild pain   Left lateral flexion    Right rotation Limited 25% ith pain   Left rotation No pain    (Blank rows = not tested)   LOWER EXTREMITY ROM:      Passive  Right eval Left eval  Hip flexion Limit  No limit  Hip extension      Hip abduction      Hip adduction      Hip internal rotation Painful  Painful   Hip external rotation No limit No limit  Knee flexion      Knee extension      Ankle dorsiflexion      Ankle plantarflexion      Ankle inversion      Ankle eversion       (Blank rows = not tested)   LOWER EXTREMITY MMT:     MMT Right eval Left eval  Hip flexion 28.3 26.6  Hip extension      Hip abduction 45.1 36.1  Hip adduction      Hip internal rotation      Hip external rotation      Knee flexion      Knee extension 50.1 55.4  Ankle dorsiflexion  Ankle plantarflexion      Ankle inversion      Ankle eversion       (Blank rows = not  tested)     GAIT: Lateral movement noted. Decreased hip flexion bilatera.  TODAY'S TREATMENT:                                                                                                                              DATE:   9/18 Trigger Point Dry-Needling  Treatment instructions: Expect mild to moderate muscle soreness. S/S of pneumothorax if dry needled over a lung field, and to seek immediate medical attention should they occur. Patient verbalized understanding of these instructions and education.  Patient Consent Given: Yes Education handout provided: Yes Muscles treated: 2x to bilateral gluteals using a .30x75 needle  Electrical stimulation performed: No Parameters: N/A Treatment response/outcome: great twitch     #15 LF Triceps Press  3x10 25 lbs REP of 5 for the last set 15 lbs RPE of 3  Cybex ( white ) Leg press 70 lbs 3x10 50 PRE of 3 70 RPE of 5  #12 Life fitness Row machine 40 lbs REP of 5   Reviewed set up of each machine and use of RPE    Eval:  Access Code: [email or web address] URL: [email or web address]   Reviewed 3 exercises to do off current HEP   LTR x10  Glute stretch 2x20 bilateral  Forwards flexion table slide 5x 10 sec hold     PATIENT EDUCATION:  Education details: HEP, symptom management  Person educated: Patient Education method: Explanation, Demonstration, Tactile cues, Verbal cues, and Handouts Education comprehension: verbalized understanding, returned demonstration, verbal cues required, tactile cues required, and needs further education   HOME EXERCISE PROGRAM: Access Code: [email or web address] URL: [email or web address] Date: 11/29/2022 Prepared by: Lorayne Bender   Exercises - Supine Piriformis Stretch with Foot on Ground  - 2 x daily - 7 x weekly - 2 sets - 3 reps - 20 sec  hold - Supine Lower Trunk Rotation  - 2 x daily - 7 x weekly - 2 sets - 10 reps - Standing Glute Med Mobilization with Small Ball on Wall  - 1 x daily - 7  x weekly - 3 sets - 1 reps - 2-3 min  hold   ASSESSMENT:   CLINICAL IMPRESSION: The patient tolerated treatment well. We performed TPDN to trigger points< he had a great twtich with each. We also reviewed gym exercises. He reported feeling tired after the exercises but no low back pain. We will review his HEP next visit. He report she hasn't done it since he was sore after the IE.     Patient is a 82 year old male who presents to therapy with low back pain that extends into his right gluteal.  He continues to have significant pain in the morning.Marland Kitchen He has increased pain with right side bending and right rotation. He has signiifcant spasming in his lower right lumbar spine  and right gluteal. He has responded well to TPDN, manual therapy, and HEP. He has not been doing much of his HEP. He would like to get his pain level down and be able to return to the gym.    OBJECTIVE IMPAIRMENTS: Abnormal gait, decreased activity tolerance, difficulty walking, decreased ROM, decreased strength, increased muscle spasms, and pain.    ACTIVITY LIMITATIONS: lifting, bending, sitting, standing, squatting, stairs, transfers, and locomotion level   PARTICIPATION LIMITATIONS: meal prep, cleaning, shopping, community activity, and yard work   PERSONAL FACTORS: Age and 1-2 comorbidities: left hip replacement  are also affecting patient's functional outcome.    REHAB POTENTIAL:  Good CLINICAL DECISION MAKING: Evolving/moderate complexity   EVALUATION COMPLEXITY: Low     GOALS: Goals reviewed with patient? Yes   SHORT TERM GOALS: Target date: 12/27/2022       Patient will demonstrate full lumbar rotation  and side bending without pain Baseline: Goal status: INITIAL   2.  Patient will report a 50% reduction in pain  in the morning Baseline:  Goal status: INITIAL   3.  Patient will increase gross left lower extremity strength by 10 pounds Baseline:  Goal status: INITIAL     LONG TERM GOALS: Target  date: 01/24/2023     Patient will report complete resolution of spasming in the morning in order to improve his ability to perform ADLs Baseline:  Goal status: INITIAL   2.  Patient will have a complete exercise program including gym activity without increased pain. Baseline:  Goal status: INITIAL   3.  Left lower extremity strength will be within 5 pounds of right lower extremity strength to improve symmetry with gait Baseline:  Goal status: INITIAL PLAN:   PT FREQUENCY: 2x/week   PT DURATION: 8 weeks   PLANNED INTERVENTIONS: Therapeutic exercises, Therapeutic activity, Neuromuscular re-education, Balance training, Gait training, Patient/Family education, Self Care, Joint mobilization, Joint manipulation, DME instructions, Aquatic Therapy, Electrical stimulation, Cryotherapy, Moist heat, Taping, Ultrasound, and Manual therapy.   PLAN FOR NEXT SESSION: consider TPDN, Trigger point release to gluteals; review base strengthening exercises. Progress to gym exercises as tolerated.        Dessie Coma, PT 08/15/2023, 1:08 PM

## 2023-08-22 ENCOUNTER — Encounter (HOSPITAL_BASED_OUTPATIENT_CLINIC_OR_DEPARTMENT_OTHER): Payer: Self-pay | Admitting: Physical Therapy

## 2023-08-22 ENCOUNTER — Ambulatory Visit (HOSPITAL_BASED_OUTPATIENT_CLINIC_OR_DEPARTMENT_OTHER): Payer: Medicare Other | Admitting: Physical Therapy

## 2023-08-22 ENCOUNTER — Other Ambulatory Visit (HOSPITAL_BASED_OUTPATIENT_CLINIC_OR_DEPARTMENT_OTHER): Payer: Self-pay

## 2023-08-22 DIAGNOSIS — M4316 Spondylolisthesis, lumbar region: Secondary | ICD-10-CM | POA: Diagnosis not present

## 2023-08-22 DIAGNOSIS — M5459 Other low back pain: Secondary | ICD-10-CM

## 2023-08-22 DIAGNOSIS — R2689 Other abnormalities of gait and mobility: Secondary | ICD-10-CM

## 2023-08-22 NOTE — Therapy (Signed)
OUTPATIENT PHYSICAL THERAPY THORACOLUMBAR EVALUATION   Patient Name: Jimmy Palmer MRN: [id number] DOB:Apr 13, 1941, 82 y.o., male Today's Date: 08/22/2023  END OF SESSION:  PT End of Session - 08/22/23 1425     Visit Number 3    Number of Visits 16    Date for PT Re-Evaluation 10/03/23    Authorization Type progress note and Kx at 10    PT Start Time 1346    PT Stop Time 1430    PT Time Calculation (min) 44 min    Activity Tolerance Patient tolerated treatment well    Behavior During Therapy [facility] for tasks assessed/performed             Past Medical History:  Diagnosis Date   Abnormal electrocardiogram    Abnormal heart sounds    Allergy 2020   upon arrival to [facility]   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   Chronic kidney disease 11/07/2021   Depression 2020   receiving appropriate therapy   Herniated lumbar intervertebral disc    Hypertension    Keratosis    benign   Left bundle branch block    Pacemaker    Pure hypercholesterolemia    Sick sinus syndrome (HCC)    Sleep apnea    VT (ventricular tachycardia) (HCC)    Past Surgical History:  Procedure Laterality Date   CARDIOVERSION N/A 09/28/2020   Procedure: CARDIOVERSION;  Surgeon: Elease Hashimoto Deloris Ping, MD;  [facility]: [facility];  Service: Cardiovascular;  Laterality: N/A;   INSERT / REPLACE / REMOVE PACEMAKER     JOINT REPLACEMENT     partial right knee, medial side   PPM GENERATOR CHANGEOUT N/A 04/30/2023   Procedure: PPM GENERATOR CHANGEOUT;  Surgeon: Marinus Maw, MD;  [facility]: [facility];  Service: Cardiovascular;  Laterality: N/A;   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   Procedure: LEFT TOTAL HIP ARTHROPLASTY ANTERIOR APPROACH;  Surgeon: Kathryne Hitch, MD;  Location: [facility];  Service: Orthopedics;  Laterality: Left;   VASECTOMY     Patient Active Problem List   Diagnosis Date Noted   Abscess of right index finger 02/28/2023   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   Left hip pain 02/01/2022   Environmental and seasonal allergies 02/01/2022   Hypertension 11/07/2021   Chronic kidney disease 11/07/2021   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 He reports it is still sore in the morning. He feels like the gym exercises helped.   Eval:The patient has a long history of low back pain. He had an injection on August 5th. It lasted less time as the previous injection. The first thing in the morning when he gets up he has significnat pain. When he puts heat on the pain decreases   PERTINENT HISTORY:  Arthritis, CKD, Herniated intervertebral discs, depression, Left BBB, THA left, VT   PAIN:  Are you having pain? Yes: NPRS scale: 7/10 in the morning when he got up  Pain location: Lumbar spine/ right hip  Pain description: aching  Aggravating factors: Mornings  Relieving factors: Heat and voltarin  PRECAUTIONS: None   WEIGHT BEARING RESTRICTIONS: No   FALLS:  Has patient fallen in last 6 months? 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 No acute findings. 2. 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 3. 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 Moderate bilateral neural foraminal narrowing at the L4-5 level right worse than left. Minimal bilateral neural foraminal narrowing at L2-3 and L3-4. 4. Grade 1 anterolisthesis of L4 on L5 due to facet arthropathy.   PATIENT SURVEYS:  FOTO     SCREENING FOR RED FLAGS: Bowel or bladder incontinence: No Spinal tumors: No Cauda equina syndrome: No Compression fracture: No Abdominal aneurysm: No   COGNITION: Overall cognitive status: Within functional limits for tasks assessed                          SENSATION: Denies paresthesias    MUSCLE LENGTH:   POSTURE: No Significant postural limitations   PALPATION: Spasming in the lumbar spine .    LUMBAR ROM:    AROM eval  Flexion Full   Extension Full   Right lateral flexion  mild pain   Left lateral flexion    Right rotation Limited 25% ith pain   Left rotation No pain    (Blank rows = not tested)   LOWER EXTREMITY ROM:      Passive  Right eval Left eval  Hip flexion Limit  No limit  Hip extension      Hip abduction      Hip adduction      Hip internal rotation Painful  Painful   Hip external rotation No limit No limit  Knee flexion      Knee extension      Ankle dorsiflexion      Ankle plantarflexion      Ankle inversion      Ankle eversion       (Blank rows = not tested)   LOWER EXTREMITY MMT:     MMT Right eval Left eval  Hip flexion 28.3 26.6  Hip extension      Hip abduction 45.1 36.1  Hip adduction      Hip internal rotation      Hip external rotation      Knee flexion      Knee extension 50.1 55.4  Ankle dorsiflexion  Ankle plantarflexion      Ankle inversion      Ankle eversion       (Blank rows =  not tested)     GAIT: Lateral movement noted. Decreased hip flexion bilatera.  TODAY'S TREATMENT:                                                                                                                              DATE:   09/26 Trigger Point Dry-Needling  Treatment instructions: Expect mild to moderate muscle soreness. S/S of pneumothorax if dry needled over a lung field, and to seek immediate medical attention should they occur. Patient verbalized understanding of these instructions and education.  Patient Consent Given: Yes Education handout provided: Yes Muscles treated: 2x to bilateral gluteals using a .30x75 needle 1x in lower lumbar paraspinal on the right  Electrical stimulation performed: No Parameters: N/A Treatment response/outcome: great twitch   Reviewed current gym program. Helped him find his old gym program.   Knee extension 10 lbs 2x12  Hip abduction 40 lbs 2x12  Row 2x12 20 lbs  Tricpes press down 20 lbs 2x12   Reviewed prior weights vs current weights    9/18 Trigger Point Dry-Needling  Treatment instructions: Expect mild to moderate muscle soreness. S/S of pneumothorax if dry needled over a lung field, and to seek immediate medical attention should they occur. Patient verbalized understanding of these instructions and education.  Patient Consent Given: Yes Education handout provided: Yes Muscles treated: 2x to bilateral gluteals using a .30x75 needle  Electrical stimulation performed: No Parameters: N/A Treatment response/outcome: great twitch     #15 LF Triceps Press  3x10 25 lbs REP of 5 for the last set 15 lbs RPE of 3  Cybex ( white ) Leg press 70 lbs 3x10 50 PRE of 3 70 RPE of 5  #12 Life fitness Row machine 40 lbs REP of 5   Reviewed set up of each machine and use of RPE    Eval:  Access Code: [email or web address] URL: [email or web address]   Reviewed 3 exercises to do off current HEP   LTR x10  Glute stretch 2x20 bilateral   Forwards flexion table slide 5x 10 sec hold     PATIENT EDUCATION:  Education details: HEP, symptom management  Person educated: Patient Education method: Explanation, Demonstration, Tactile cues, Verbal cues, and Handouts Education comprehension: verbalized understanding, returned demonstration, verbal cues required, tactile cues required, and needs further education   HOME EXERCISE PROGRAM: Access Code: [email or web address] URL: [email or web address] Date: 11/29/2022 Prepared by: Lorayne Bender   Exercises - Supine Piriformis Stretch with Foot on Ground  - 2 x daily - 7 x weekly - 2 sets - 3 reps - 20 sec  hold - Supine Lower Trunk Rotation  - 2 x daily - 7 x weekly - 2 sets - 10 reps - Standing Glute Med Mobilization with Small Ball on Wall  - 1 x daily - 7 x weekly -  3 sets - 1 reps - 2-3 min  hold   ASSESSMENT:   CLINICAL IMPRESSION: The patient was fatigued after treatment but overall he did well. His weights have dropped some what from the last time he has done his gym program but that is not unexpected. He had no increase in pain. He tolerated needling well. We will continue with needling if he finds benefit otherwise we will work back into exercises.    Patient is a 82 year old male who presents to therapy with low back pain that extends into his right gluteal.  He continues to have significant pain in the morning.Marland Kitchen He has increased pain with right side bending and right rotation. He has signiifcant spasming in his lower right lumbar spine and right gluteal. He has responded well to TPDN, manual therapy, and HEP. He has not been doing much of his HEP. He would like to get his pain level down and be able to return to the gym.    OBJECTIVE IMPAIRMENTS: Abnormal gait, decreased activity tolerance, difficulty walking, decreased ROM, decreased strength, increased muscle spasms, and pain.    ACTIVITY LIMITATIONS: lifting, bending, sitting, standing, squatting, stairs, transfers,  and locomotion level   PARTICIPATION LIMITATIONS: meal prep, cleaning, shopping, community activity, and yard work   PERSONAL FACTORS: Age and 1-2 comorbidities: left hip replacement  are also affecting patient's functional outcome.    REHAB POTENTIAL:  Good CLINICAL DECISION MAKING: Evolving/moderate complexity   EVALUATION COMPLEXITY: Low     GOALS: Goals reviewed with patient? Yes   SHORT TERM GOALS: Target date: 12/27/2022       Patient will demonstrate full lumbar rotation  and side bending without pain Baseline: Goal status: INITIAL   2.  Patient will report a 50% reduction in pain  in the morning Baseline:  Goal status: INITIAL   3.  Patient will increase gross left lower extremity strength by 10 pounds Baseline:  Goal status: INITIAL     LONG TERM GOALS: Target date: 01/24/2023     Patient will report complete resolution of spasming in the morning in order to improve his ability to perform ADLs Baseline:  Goal status: INITIAL   2.  Patient will have a complete exercise program including gym activity without increased pain. Baseline:  Goal status: INITIAL   3.  Left lower extremity strength will be within 5 pounds of right lower extremity strength to improve symmetry with gait Baseline:  Goal status: INITIAL PLAN:   PT FREQUENCY: 2x/week   PT DURATION: 8 weeks   PLANNED INTERVENTIONS: Therapeutic exercises, Therapeutic activity, Neuromuscular re-education, Balance training, Gait training, Patient/Family education, Self Care, Joint mobilization, Joint manipulation, DME instructions, Aquatic Therapy, Electrical stimulation, Cryotherapy, Moist heat, Taping, Ultrasound, and Manual therapy.   PLAN FOR NEXT SESSION: consider TPDN, Trigger point release to gluteals; review base strengthening exercises. Progress to gym exercises as tolerated.        Dessie Coma, PT 08/22/2023, 2:27 PM

## 2023-08-29 ENCOUNTER — Encounter (HOSPITAL_BASED_OUTPATIENT_CLINIC_OR_DEPARTMENT_OTHER): Payer: Self-pay | Admitting: Physical Therapy

## 2023-08-29 ENCOUNTER — Ambulatory Visit (HOSPITAL_BASED_OUTPATIENT_CLINIC_OR_DEPARTMENT_OTHER): Payer: Medicare Other | Attending: Physical Medicine and Rehabilitation | Admitting: Physical Therapy

## 2023-08-29 DIAGNOSIS — R2689 Other abnormalities of gait and mobility: Secondary | ICD-10-CM | POA: Diagnosis present

## 2023-08-29 DIAGNOSIS — M25551 Pain in right hip: Secondary | ICD-10-CM | POA: Diagnosis present

## 2023-08-29 DIAGNOSIS — M5459 Other low back pain: Secondary | ICD-10-CM | POA: Diagnosis present

## 2023-08-29 DIAGNOSIS — Z96642 Presence of left artificial hip joint: Secondary | ICD-10-CM | POA: Insufficient documentation

## 2023-08-29 DIAGNOSIS — M25552 Pain in left hip: Secondary | ICD-10-CM | POA: Diagnosis present

## 2023-08-29 DIAGNOSIS — M25652 Stiffness of left hip, not elsewhere classified: Secondary | ICD-10-CM | POA: Insufficient documentation

## 2023-08-29 NOTE — Therapy (Unsigned)
OUTPATIENT PHYSICAL THERAPY THORACOLUMBAR EVALUATION   Patient Name: Jimmy Palmer MRN: [id number] DOB:1940/12/01, 82 y.o., male Today's Date: 08/29/2023  END OF SESSION:  PT End of Session - 08/29/23 1500     Visit Number 4    Number of Visits 16    Date for PT Re-Evaluation 10/03/23    PT Start Time 1345    PT Stop Time 1428    PT Time Calculation (min) 43 min    Activity Tolerance Patient tolerated treatment well    Behavior During Therapy [facility] for tasks assessed/performed             Past Medical History:  Diagnosis Date   Abnormal electrocardiogram    Abnormal heart sounds    Allergy 2020   upon arrival to [facility]   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   Chronic kidney disease 11/07/2021   Depression 2020   receiving appropriate therapy   Herniated lumbar intervertebral disc    Hypertension    Keratosis    benign   Left bundle branch block    Pacemaker    Pure hypercholesterolemia    Sick sinus syndrome (HCC)    Sleep apnea    VT (ventricular tachycardia) (HCC)    Past Surgical History:  Procedure Laterality Date   CARDIOVERSION N/A 09/28/2020   Procedure: CARDIOVERSION;  Surgeon: Elease Hashimoto Deloris Ping, MD;  [facility]: [facility];  Service: Cardiovascular;  Laterality: N/A;   INSERT / REPLACE / REMOVE PACEMAKER     JOINT REPLACEMENT     partial right knee, medial side   PPM GENERATOR CHANGEOUT N/A 04/30/2023   Procedure: PPM GENERATOR CHANGEOUT;  Surgeon: Marinus Maw, MD;  [facility]: [facility];  Service: Cardiovascular;  Laterality: N/A;   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   Procedure: LEFT TOTAL HIP ARTHROPLASTY ANTERIOR APPROACH;  Surgeon: Kathryne Hitch, MD;  Location: [facility];  Service: Orthopedics;  Laterality: Left;   VASECTOMY     Patient Active Problem List   Diagnosis Date Noted   Abscess of right index finger 02/28/2023   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   Left hip  pain 02/01/2022   Environmental and seasonal allergies 02/01/2022   Hypertension 11/07/2021   Chronic kidney disease 11/07/2021   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  He tolerated the last session well.  No significant pain at this time-0 Eval:The patient has a long history of low back pain. He had an injection on August 5th. It lasted less time as the previous injection. The first thing in the morning when he gets up he has significnat pain. When he puts heat on the pain decreases   PERTINENT HISTORY:  Arthritis, CKD, Herniated intervertebral discs, depression, Left BBB, THA left, VT   PAIN:  Are you having pain? Yes: NPRS scale: 0/10 in the morning when he got up  Pain location: Lumbar spine/ right hip  Pain description: aching  Aggravating factors: Mornings  Relieving factors: Heat and voltarin  PRECAUTIONS: None   WEIGHT BEARING RESTRICTIONS: No   FALLS:  Has patient fallen in last 6 months? 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 No acute findings. 2. 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 3. 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 Moderate bilateral neural foraminal narrowing at the L4-5 level right worse than left. Minimal bilateral neural foraminal narrowing at L2-3 and L3-4. 4. Grade 1 anterolisthesis of L4 on L5 due to facet arthropathy.   PATIENT SURVEYS:  FOTO     SCREENING FOR RED FLAGS: Bowel or bladder incontinence: No Spinal tumors: No Cauda equina syndrome: No Compression fracture: No Abdominal aneurysm: No   COGNITION: Overall cognitive status: Within functional limits for tasks assessed                          SENSATION: Denies paresthesias    MUSCLE LENGTH:   POSTURE: No Significant postural limitations   PALPATION: Spasming in the lumbar spine .    LUMBAR ROM:    AROM eval  Flexion Full   Extension Full   Right lateral flexion  mild pain   Left lateral flexion    Right rotation Limited 25% ith pain   Left rotation No pain    (Blank rows = not tested)   LOWER EXTREMITY ROM:      Passive  Right eval Left eval  Hip flexion Limit  No limit  Hip extension      Hip abduction      Hip adduction      Hip internal rotation Painful  Painful   Hip external rotation No limit No limit  Knee flexion      Knee extension      Ankle dorsiflexion      Ankle plantarflexion      Ankle inversion      Ankle eversion       (Blank rows = not tested)   LOWER EXTREMITY MMT:     MMT Right eval Left eval  Hip flexion 28.3 26.6  Hip extension      Hip abduction 45.1 36.1  Hip adduction      Hip internal rotation      Hip external rotation      Knee flexion      Knee extension 50.1 55.4  Ankle dorsiflexion  Ankle plantarflexion      Ankle inversion      Ankle eversion       (Blank rows = not tested)      GAIT: Lateral movement noted. Decreased hip flexion bilatera.  TODAY'S TREATMENT:                                                                                                                              DATE:  10/3 Trigger Point Dry-Needling  Treatment instructions: Expect mild to moderate muscle soreness. S/S of pneumothorax if dry needled over a lung field, and to seek immediate medical attention should they occur. Patient verbalized understanding of these instructions and education.  Patient Consent Given: Yes Education handout provided: Yes Muscles treated: 2x to bilateral gluteals using a .30x75 needle 1x in lower lumbar paraspinal on the right  Electrical stimulation performed: No Parameters: N/A Treatment response/outcome: great twitch   Reviewed current gym program. Helped him find his old gym program.   Manual: trigger point release to lumbar spine; skilled palpation of trigger points   Leg press 3x12 25 lbs  Knee flexion 3x12 10 lbs  Shoulder extension cables 3 x 12 10 pounds  NuStep 5 minutes reviewed proper set up    09/26 Trigger Point Dry-Needling  Treatment instructions: Expect mild to moderate muscle soreness. S/S of pneumothorax if dry needled over a lung field, and to seek immediate medical attention should they occur. Patient verbalized understanding of these instructions and education.  Patient Consent Given: Yes Education handout provided: Yes Muscles treated: 2x to bilateral gluteals using a .30x75 needle 1x in lower lumbar paraspinal on the right  Electrical stimulation performed: No Parameters: N/A Treatment response/outcome: great twitch   Reviewed current gym program. Helped him find his old gym program.   Knee extension 10 lbs 2x12  Hip abduction 40 lbs 2x12  Row 2x12 20 lbs  Tricpes press down 20 lbs 2x12   Reviewed prior weights vs current weights    9/18 Trigger Point Dry-Needling  Treatment instructions: Expect mild to moderate  muscle soreness. S/S of pneumothorax if dry needled over a lung field, and to seek immediate medical attention should they occur. Patient verbalized understanding of these instructions and education.  Patient Consent Given: Yes Education handout provided: Yes Muscles treated: 2x to bilateral gluteals using a .30x75 needle  Electrical stimulation performed: No Parameters: N/A Treatment response/outcome: great twitch     #15 LF Triceps Press  3x10 25 lbs REP of 5 for the last set 15 lbs RPE of 3  Cybex ( white ) Leg press 70 lbs 3x10 50 PRE of 3 70 RPE of 5  #12 Life fitness Row machine 40 lbs REP of 5   Reviewed set up of each machine and use of RPE    Eval:  Access Code: [email or web address] URL: [email or web address]   Reviewed 3 exercises to do off current HEP   LTR x10  Glute stretch 2x20 bilateral  Forwards flexion table slide 5x 10 sec hold     PATIENT EDUCATION:  Education details: HEP, symptom management  Person educated: Patient Education method: Explanation, Demonstration, Tactile cues, Verbal cues, and Handouts Education comprehension: verbalized understanding, returned demonstration, verbal cues required, tactile cues required, and needs further education   HOME EXERCISE PROGRAM: Access Code: [email or web address] URL: [email or web address] Date: 11/29/2022 Prepared by: Lorayne Bender   Exercises - Supine Piriformis Stretch with Foot on Ground  - 2 x daily - 7 x weekly - 2 sets - 3 reps - 20 sec  hold - Supine Lower Trunk Rotation  - 2 x daily - 7 x weekly - 2 sets - 10 reps - Standing Glute Med Mobilization with Small Ball on Wall  - 1 x daily - 7 x weekly - 3 sets - 1 reps - 2-3 min  hold   ASSESSMENT:   CLINICAL IMPRESSION: The patient continues to tolerate treatment well.  He continues a good twitch response with needling.  He feels like his back is progressively improving.  He is having less pain in the morning.  We reviewed gym program with him  today.  We continue to work through his prior gym sheet with him.  We will continue to work on having him independently set up his machines.  Therapy will continue to progress as tolerated.  Right rotation continues to be limited 25% with mild pain at end range.  Patient is a 82 year old male who presents to therapy with low back pain that extends into his right gluteal.  He continues to have significant pain in the morning.Marland Kitchen He has increased pain with right side bending and right rotation. He has signiifcant spasming in his lower right lumbar spine and right gluteal. He has responded well to TPDN, manual therapy, and HEP. He has not been doing much of his HEP. He would like to get his pain level down and be able to return to the gym.    OBJECTIVE IMPAIRMENTS: Abnormal gait, decreased activity tolerance, difficulty walking, decreased ROM, decreased strength, increased muscle spasms, and pain.    ACTIVITY LIMITATIONS: lifting, bending, sitting, standing, squatting, stairs, transfers, and locomotion level   PARTICIPATION LIMITATIONS: meal prep, cleaning, shopping, community activity, and yard work   PERSONAL FACTORS: Age and 1-2 comorbidities: left hip replacement  are also affecting patient's functional outcome.    REHAB POTENTIAL:  Good CLINICAL DECISION MAKING: Evolving/moderate complexity   EVALUATION COMPLEXITY: Low     GOALS: Goals reviewed with patient? Yes   SHORT TERM GOALS: Target date: 09/27/2023         Patient will demonstrate full lumbar rotation  and side bending without pain Baseline: Goal status: Continues to have minimal pain with endrange rotation to the right 10/3   2.  Patient will report a 50% reduction in pain  in the morning Baseline:  Goal status: INITIAL   3.  Patient will increase gross left lower extremity strength by 10 pounds Baseline:  Goal status: INITIAL     LONG TERM GOALS: Target date: 10/25/2023       Patient will report complete  resolution of spasming in the morning in order to improve his ability to perform ADLs Baseline:  Goal status: INITIAL   2.  Patient will have a complete exercise program including gym activity without increased pain. Baseline:  Goal status: INITIAL   3.  Left lower extremity strength will be within 5 pounds of right lower extremity strength to improve symmetry with gait Baseline:  Goal status: INITIAL PLAN:   PT FREQUENCY: 2x/week   PT DURATION: 8 weeks   PLANNED INTERVENTIONS: Therapeutic exercises, Therapeutic activity, Neuromuscular re-education, Balance training, Gait training, Patient/Family education, Self Care, Joint mobilization, Joint manipulation, DME instructions, Aquatic Therapy, Electrical stimulation, Cryotherapy, Moist heat, Taping, Ultrasound, and Manual therapy.   PLAN FOR NEXT SESSION: consider TPDN, Trigger point release to gluteals; review base strengthening exercises. Progress to gym exercises as tolerated.        Dessie Coma, PT 08/29/2023, 3:03 PM

## 2023-09-05 ENCOUNTER — Encounter (HOSPITAL_BASED_OUTPATIENT_CLINIC_OR_DEPARTMENT_OTHER): Payer: Self-pay | Admitting: Physical Therapy

## 2023-09-05 ENCOUNTER — Other Ambulatory Visit: Payer: Self-pay | Admitting: Internal Medicine

## 2023-09-05 ENCOUNTER — Ambulatory Visit (HOSPITAL_BASED_OUTPATIENT_CLINIC_OR_DEPARTMENT_OTHER): Payer: Medicare Other | Admitting: Physical Therapy

## 2023-09-05 ENCOUNTER — Other Ambulatory Visit (HOSPITAL_BASED_OUTPATIENT_CLINIC_OR_DEPARTMENT_OTHER): Payer: Self-pay

## 2023-09-05 DIAGNOSIS — I4819 Other persistent atrial fibrillation: Secondary | ICD-10-CM

## 2023-09-05 DIAGNOSIS — R2689 Other abnormalities of gait and mobility: Secondary | ICD-10-CM | POA: Diagnosis not present

## 2023-09-05 DIAGNOSIS — M5459 Other low back pain: Secondary | ICD-10-CM

## 2023-09-05 MED ORDER — APIXABAN 5 MG PO TABS
5.0000 mg | ORAL_TABLET | Freq: Two times a day (BID) | ORAL | 1 refills | Status: DC
Start: 2023-09-05 — End: 2024-02-27
  Filled 2023-09-05: qty 180, 90d supply, fill #0
  Filled 2023-12-04: qty 180, 90d supply, fill #1

## 2023-09-05 NOTE — Therapy (Signed)
OUTPATIENT PHYSICAL THERAPY THORACOLUMBAR EVALUATION   Patient Name: Jimmy Palmer MRN: [id number] DOB:[birth year], 82 y.o., male Today's Date: 09/05/2023  END OF SESSION:  PT End of Session - 09/05/23 1236     Visit Number 5    Number of Visits 16    Date for PT Re-Evaluation 10/03/23    PT Start Time 1235    PT Stop Time 1315    PT Time Calculation (min) 40 min    Activity Tolerance Patient tolerated treatment well    Behavior During Therapy [facility] for tasks assessed/performed             Past Medical History:  Diagnosis Date   Abnormal electrocardiogram    Abnormal heart sounds    Allergy 2020   upon arrival to [facility]   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   Chronic kidney disease 11/07/2021   Depression 2020   receiving appropriate therapy   Herniated lumbar intervertebral disc    Hypertension    Keratosis    benign   Left bundle branch block    Pacemaker    Pure hypercholesterolemia    Sick sinus syndrome (HCC)    Sleep apnea    VT (ventricular tachycardia) (HCC)    Past Surgical History:  Procedure Laterality Date   CARDIOVERSION N/A 09/28/2020   Procedure: CARDIOVERSION;  Surgeon: Elease Hashimoto Deloris Ping, MD;  Location: [facility];  Service: Cardiovascular;  Laterality: N/A;   INSERT / REPLACE / REMOVE PACEMAKER     JOINT REPLACEMENT     partial right knee, medial side   PPM GENERATOR CHANGEOUT N/A 04/30/2023   Procedure: PPM GENERATOR CHANGEOUT;  Surgeon: Marinus Maw, MD;  Location: [facility] INVASIVE CV LAB;  Service: Cardiovascular;  Laterality: N/A;   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   Procedure: LEFT TOTAL HIP ARTHROPLASTY ANTERIOR APPROACH;  Surgeon: Kathryne Hitch, MD;  Location: [facility];  Service: Orthopedics;  Laterality: Left;   VASECTOMY     Patient Active Problem List   Diagnosis Date Noted   Abscess of right index finger 02/28/2023   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   Left hip  pain 02/01/2022   Environmental and seasonal allergies 02/01/2022   Hypertension 11/07/2021   Chronic kidney disease 11/07/2021   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 Right side.   PERTINENT HISTORY:  Arthritis, CKD, Herniated intervertebral discs, depression, Left BBB, THA left, VT   PAIN:  Are you having pain? Yes: NPRS scale: 0/10 in the morning when he got up  Pain location: Lumbar spine/ right hip  Pain description: aching  Aggravating factors: Mornings  Relieving factors: Heat and voltarin  PRECAUTIONS: None   WEIGHT BEARING RESTRICTIONS: No   FALLS:  Has patient fallen in last 6 months? 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 No acute findings. 2. 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 3. 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 Moderate bilateral neural foraminal narrowing at the L4-5 level right worse than left. Minimal  bilateral neural foraminal narrowing at L2-3 and L3-4. 4. Grade 1 anterolisthesis of L4 on L5 due to facet arthropathy.   PATIENT SURVEYS:  FOTO  46   SCREENING FOR RED FLAGS: Bowel or bladder incontinence: No Spinal tumors: No Cauda equina syndrome: No Compression fracture: No Abdominal aneurysm: No   COGNITION: Overall cognitive status: Within functional limits for tasks assessed                          SENSATION: Denies paresthesias    POSTURE: No Significant postural limitations   PALPATION: Spasming in the lumbar spine .    LUMBAR ROM:    AROM eval  Flexion Full   Extension Full   Right lateral flexion  mild pain   Left lateral flexion    Right rotation Limited 25% ith pain   Left rotation No pain    (Blank rows = not tested)   LOWER EXTREMITY ROM:      Passive  Right eval Left eval  Hip flexion Limit  No limit  Hip extension      Hip abduction      Hip adduction      Hip internal rotation Painful  Painful   Hip external rotation No limit No limit  Knee flexion      Knee extension      Ankle dorsiflexion      Ankle plantarflexion      Ankle inversion      Ankle eversion       (Blank rows = not tested)   LOWER EXTREMITY MMT:     MMT Right eval Left eval  Hip flexion 28.3 26.6  Hip extension      Hip abduction 45.1 36.1  Hip adduction      Hip internal rotation      Hip external rotation      Knee flexion      Knee extension 50.1 55.4  Ankle dorsiflexion      Ankle plantarflexion      Ankle inversion      Ankle eversion       (Blank rows = not tested)     GAIT: Lateral movement noted. Decreased hip flexion bilateral.  TODAY'S TREATMENT:  DATE:  Treatment                            10/9:  Trigger Point Dry Needling, Manual Therapy Treatment:  Initial or subsequent education regarding Trigger Point Dry Needling:  Subsequent Did patient give consent to treatment with Trigger Point Dry Needling: Yes TPDN with skilled palpation and monitoring followed by STM to the following muscles: Rt glut max, med  Prone Lt lumbar stretching by PT.  Child pose Seated piriformis & HS stretches Bridge on heels- ball bw knees, arms reaching up 90/90 heel taps Lower trunk rotation Lateral step up 4", fwd step up 4"   10/3 Trigger Point Dry-Needling  Treatment instructions: Expect mild to moderate muscle soreness. S/S of pneumothorax if dry needled over a lung field, and to seek immediate medical attention should they occur. Patient verbalized understanding of these instructions and education.  Patient Consent Given: Yes Education handout provided: Yes Muscles treated: 2x to bilateral gluteals using a .30x75 needle 1x in lower lumbar paraspinal on the right  Electrical stimulation performed: No Parameters: N/A Treatment response/outcome: great twitch   Reviewed current gym program. Helped him find his old gym program.   Manual: trigger point release to lumbar spine; skilled palpation of trigger points   Leg press 3x12 25 lbs  Knee flexion 3x12 10 lbs  Shoulder extension cables 3 x 12 10 pounds  NuStep 5 minutes reviewed proper set up    09/26 Trigger Point Dry-Needling  Treatment instructions: Expect mild to moderate muscle soreness. S/S of pneumothorax if dry needled over a lung field, and to seek immediate medical attention should they occur. Patient verbalized understanding of these instructions and education.  Patient Consent Given: Yes Education handout provided: Yes Muscles treated: 2x to bilateral gluteals using a .30x75 needle 1x in lower lumbar paraspinal on the right  Electrical stimulation performed: No Parameters: N/A Treatment response/outcome: great twitch   Reviewed current gym program. Helped him find his old gym program.   Knee extension 10 lbs 2x12  Hip abduction 40 lbs 2x12  Row  2x12 20 lbs  Tricpes press down 20 lbs 2x12   Reviewed prior weights vs current weights    9/18 Trigger Point Dry-Needling  Treatment instructions: Expect mild to moderate muscle soreness. S/S of pneumothorax if dry needled over a lung field, and to seek immediate medical attention should they occur. Patient verbalized understanding of these instructions and education.  Patient Consent Given: Yes Education handout provided: Yes Muscles treated: 2x to bilateral gluteals using a .30x75 needle  Electrical stimulation performed: No Parameters: N/A Treatment response/outcome: great twitch     #15 LF Triceps Press  3x10 25 lbs REP of 5 for the last set 15 lbs RPE of 3  Cybex ( white ) Leg press 70 lbs 3x10 50 PRE of 3 70 RPE of 5  #12 Life fitness Row machine 40 lbs REP of 5   Reviewed set up of each machine and use of RPE    Eval:  Access Code: [email or web address] URL: [email or web address]   Reviewed 3 exercises to do off current HEP   LTR x10  Glute stretch 2x20 bilateral  Forwards flexion table slide 5x 10 sec hold     PATIENT EDUCATION:  Education details: HEP, symptom management  Person educated: Patient Education method: Explanation, Demonstration, Tactile cues, Verbal cues, and Handouts Education comprehension: verbalized understanding, returned demonstration, verbal cues required, tactile cues required, and needs further  education   HOME EXERCISE PROGRAM: Access Code: [email or web address] URL: [email or web address] Date: 11/29/2022 Prepared by: Lorayne Bender   Exercises - Supine Piriformis Stretch with Foot on Ground  - 2 x daily - 7 x weekly - 2 sets - 3 reps - 20 sec  hold - Supine Lower Trunk Rotation  - 2 x daily - 7 x weekly - 2 sets - 10 reps - Standing Glute Med Mobilization with Small Ball on Wall  - 1 x daily - 7 x weekly - 3 sets - 1 reps - 2-3 min  hold   ASSESSMENT:   CLINICAL IMPRESSION: Good tolerance to treatment. Focused on awareness  of glut activation and core engagement.   EVAL: Patient is a 82 year old male who presents to therapy with low back pain that extends into his right gluteal.  He continues to have significant pain in the morning.Marland Kitchen He has increased pain with right side bending and right rotation. He has signiifcant spasming in his lower right lumbar spine and right gluteal. He has responded well to TPDN, manual therapy, and HEP. He has not been doing much of his HEP. He would like to get his pain level down and be able to return to the gym.    OBJECTIVE IMPAIRMENTS: Abnormal gait, decreased activity tolerance, difficulty walking, decreased ROM, decreased strength, increased muscle spasms, and pain.    ACTIVITY LIMITATIONS: lifting, bending, sitting, standing, squatting, stairs, transfers, and locomotion level   PARTICIPATION LIMITATIONS: meal prep, cleaning, shopping, community activity, and yard work   PERSONAL FACTORS: Age and 1-2 comorbidities: left hip replacement  are also affecting patient's functional outcome.    REHAB POTENTIAL:  Good CLINICAL DECISION MAKING: Evolving/moderate complexity   EVALUATION COMPLEXITY: Low     GOALS: Goals reviewed with patient? Yes   SHORT TERM GOALS: Target date: 09/27/2023         Patient will demonstrate full lumbar rotation  and side bending without pain Baseline: Goal status: Continues to have minimal pain with endrange rotation to the right 10/3   2.  Patient will report a 50% reduction in pain  in the morning Baseline:  Goal status: INITIAL   3.  Patient will increase gross left lower extremity strength by 10 pounds Baseline:  Goal status: INITIAL     LONG TERM GOALS: Target date: 10/25/2023       Patient will report complete resolution of spasming in the morning in order to improve his ability to perform ADLs Baseline:  Goal status: INITIAL   2.  Patient will have a complete exercise program including gym activity without increased  pain. Baseline:  Goal status: INITIAL   3.  Left lower extremity strength will be within 5 pounds of right lower extremity strength to improve symmetry with gait Baseline:  Goal status: INITIAL PLAN:   PT FREQUENCY: 2x/week   PT DURATION: 8 weeks   PLANNED INTERVENTIONS: Therapeutic exercises, Therapeutic activity, Neuromuscular re-education, Balance training, Gait training, Patient/Family education, Self Care, Joint mobilization, Joint manipulation, DME instructions, Aquatic Therapy, Electrical stimulation, Cryotherapy, Moist heat, Taping, Ultrasound, and Manual therapy.   PLAN FOR NEXT SESSION: consider TPDN, Trigger point release to gluteals; review base strengthening exercises. Progress to gym exercises as tolerated.        Thane Age C. Xadrian Craighead PT, DPT 09/05/23 2:01 PM

## 2023-09-05 NOTE — Telephone Encounter (Signed)
Eliquis 5mg  refill request received. Patient is 82 years old, weight-77kg, Crea-1.46 on 04/24/23, Diagnosis-Afib, and last seen by Dr. Graciela Husbands on 08/02/23. Dose is appropriate based on dosing criteria. Will send in refill to requested pharmacy.

## 2023-09-06 ENCOUNTER — Telehealth: Payer: Self-pay | Admitting: Internal Medicine

## 2023-09-06 NOTE — Telephone Encounter (Signed)
Patient is calling to talk with Dr. Graciela Husbands or nurse in regards to [facility] message

## 2023-09-06 NOTE — Telephone Encounter (Signed)
Returned pt's call. Patient stated he got a letter in the mail from his insurance company [employer]. They are requesting medication verification about eliquis from Dr Graciela Husbands. Told pt we would need paper work. Pt gave number and case number and that he would get [place] the paperwork. Pt stated Mindi Junker normally takes care of this. Will forward to Mindi Junker (Dr Koren Bound RN) to review.  [phone] Case ID- [id number]

## 2023-09-10 ENCOUNTER — Telehealth: Payer: Self-pay | Admitting: Pharmacy Technician

## 2023-09-10 ENCOUNTER — Other Ambulatory Visit (HOSPITAL_COMMUNITY): Payer: Self-pay

## 2023-09-10 NOTE — Telephone Encounter (Signed)
Pharmacy Patient Advocate Encounter   Received notification from Pt Calls Messages that prior authorization for eliquis is required/requested.   Insurance verification completed.   The patient is insured through [employer] .   Per test claim: Refill too soon. PA is not needed at this time. Medication was filled 09/05/23. Next eligible fill date is 11/11/23.

## 2023-09-12 ENCOUNTER — Ambulatory Visit (HOSPITAL_BASED_OUTPATIENT_CLINIC_OR_DEPARTMENT_OTHER): Payer: Medicare Other | Admitting: Physical Therapy

## 2023-09-12 ENCOUNTER — Encounter (HOSPITAL_BASED_OUTPATIENT_CLINIC_OR_DEPARTMENT_OTHER): Payer: Self-pay | Admitting: Physical Therapy

## 2023-09-12 DIAGNOSIS — R2689 Other abnormalities of gait and mobility: Secondary | ICD-10-CM | POA: Diagnosis not present

## 2023-09-12 DIAGNOSIS — M25552 Pain in left hip: Secondary | ICD-10-CM

## 2023-09-12 DIAGNOSIS — M25551 Pain in right hip: Secondary | ICD-10-CM

## 2023-09-12 DIAGNOSIS — M25652 Stiffness of left hip, not elsewhere classified: Secondary | ICD-10-CM

## 2023-09-12 DIAGNOSIS — M5459 Other low back pain: Secondary | ICD-10-CM

## 2023-09-12 DIAGNOSIS — Z96642 Presence of left artificial hip joint: Secondary | ICD-10-CM

## 2023-09-12 NOTE — Therapy (Signed)
OUTPATIENT PHYSICAL THERAPY THORACOLUMBAR EVALUATION   Patient Name: Jimmy Palmer MRN: [id number] DOB:1941/03/17, 82 y.o., male Today's Date: 09/12/2023  END OF SESSION:  PT End of Session - 09/12/23 1341     Visit Number 6    Number of Visits 16    Date for PT Re-Evaluation 10/03/23    Authorization Type progress note and Kx at 10    Activity Tolerance Patient tolerated treatment well    Behavior During Therapy [facility] for tasks assessed/performed             Past Medical History:  Diagnosis Date   Abnormal electrocardiogram    Abnormal heart sounds    Allergy 2020   upon arrival to [facility]   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   Chronic kidney disease 11/07/2021   Depression 2020   receiving appropriate therapy   Herniated lumbar intervertebral disc    Hypertension    Keratosis    benign   Left bundle branch block    Pacemaker    Pure hypercholesterolemia    Sick sinus syndrome (HCC)    Sleep apnea    VT (ventricular tachycardia) (HCC)    Past Surgical History:  Procedure Laterality Date   CARDIOVERSION N/A 09/28/2020   Procedure: CARDIOVERSION;  Surgeon: Elease Hashimoto Deloris Ping, MD;  Location: [facility] ENDOSCOPY;  Service: Cardiovascular;  Laterality: N/A;   INSERT / REPLACE / REMOVE PACEMAKER     JOINT REPLACEMENT     partial right knee, medial side   PPM GENERATOR CHANGEOUT N/A 04/30/2023   Procedure: PPM GENERATOR CHANGEOUT;  Surgeon: Marinus Maw, MD;  Location: [facility] INVASIVE CV LAB;  Service: Cardiovascular;  Laterality: N/A;   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   Procedure: LEFT TOTAL HIP ARTHROPLASTY ANTERIOR APPROACH;  Surgeon: Kathryne Hitch, MD;  Location: [facility] ORS;  Service: Orthopedics;  Laterality: Left;   VASECTOMY     Patient Active Problem List   Diagnosis Date Noted   Abscess of right index finger 02/28/2023   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   Left hip pain 02/01/2022   Environmental  and seasonal allergies 02/01/2022   Hypertension 11/07/2021   Chronic kidney disease 11/07/2021   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  PERTINENT HISTORY:  Arthritis, CKD, Herniated intervertebral discs, depression, Left BBB, THA left, VT   PAIN:  Are you having pain? Yes: NPRS scale: 0/10 in the morning when he got up  Pain location: Lumbar spine/ right hip  Pain description: aching  Aggravating factors: Mornings  Relieving factors: Heat and voltarin  PRECAUTIONS: None   WEIGHT BEARING RESTRICTIONS: No   FALLS:  Has patient fallen in last 6 months? 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 No acute findings. 2. 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 3. 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 Moderate bilateral neural foraminal narrowing at the L4-5 level right worse than left. Minimal bilateral  neural foraminal narrowing at L2-3 and L3-4. 4. Grade 1 anterolisthesis of L4 on L5 due to facet arthropathy.   PATIENT SURVEYS:  FOTO  46   SCREENING FOR RED FLAGS: Bowel or bladder incontinence: No Spinal tumors: No Cauda equina syndrome: No Compression fracture: No Abdominal aneurysm: No   COGNITION: Overall cognitive status: Within functional limits for tasks assessed                          SENSATION: Denies paresthesias    POSTURE: No Significant postural limitations   PALPATION: Spasming in the lumbar spine .    LUMBAR ROM:    AROM eval  Flexion Full   Extension Full   Right lateral flexion  mild pain   Left lateral flexion    Right rotation Limited 25% ith pain   Left rotation No pain    (Blank rows = not tested)   LOWER EXTREMITY ROM:      Passive  Right eval Left eval  Hip flexion Limit  No limit  Hip extension      Hip abduction      Hip adduction      Hip internal rotation Painful  Painful   Hip external rotation No limit No limit  Knee flexion      Knee extension      Ankle dorsiflexion      Ankle plantarflexion      Ankle inversion      Ankle eversion       (Blank rows = not tested)   LOWER EXTREMITY MMT:     MMT Right eval Left eval  Hip flexion 28.3 26.6  Hip extension      Hip abduction 45.1 36.1  Hip adduction      Hip internal rotation      Hip external rotation      Knee flexion      Knee extension 50.1 55.4  Ankle dorsiflexion      Ankle plantarflexion      Ankle inversion      Ankle eversion       (Blank rows = not tested)     GAIT: Lateral movement noted. Decreased hip flexion bilateral.  TODAY'S TREATMENT:  DATE:  10/16 Trigger Point Dry-Needling  Treatment instructions: Expect mild to moderate muscle soreness. S/S of pneumothorax if dry needled over a lung field, and to seek immediate  medical attention should they occur. Patient verbalized understanding of these instructions and education.  Patient Consent Given: Yes Education handout provided: Yes Muscles treated: 2x to bilateral gluteals using a .30x75 needle 1x in lower lumbar paraspinal on the right  Electrical stimulation performed: No Parameters: N/A Treatment response/outcome: great twitch   Reviewed current gym program. Helped him find his old gym program.   Manual: trigger point release to lumbar spine; skilled palpation of trigger points   Leg press 3x12 70 lbs cybex                       Cybex row 3x12 50 lbs                    LF hip abduction 70 lbs   L stretch 3x20 sec hold  Bilateral glute stretch 3x20 sec hold  LTR x15 each side  Nu-step 5 min     Treatment                            10/9:  Trigger Point Dry Needling, Manual Therapy Treatment:  Initial or subsequent education regarding Trigger Point Dry Needling: Subsequent Did patient give consent to treatment with Trigger Point Dry Needling: Yes TPDN with skilled palpation and monitoring followed by STM to the following muscles: Rt glut max, med  Prone Lt lumbar stretching by PT.  Child pose Seated piriformis & HS stretches Bridge on heels- ball bw knees, arms reaching up 90/90 heel taps Lower trunk rotation Lateral step up 4", fwd step up 4"   10/3 Trigger Point Dry-Needling  Treatment instructions: Expect mild to moderate muscle soreness. S/S of pneumothorax if dry needled over a lung field, and to seek immediate medical attention should they occur. Patient verbalized understanding of these instructions and education.  Patient Consent Given: Yes Education handout provided: Yes Muscles treated: 2x to bilateral gluteals using a .30x75 needle 1x in lower lumbar paraspinal on the right  Electrical stimulation performed: No Parameters: N/A Treatment response/outcome: great twitch   Reviewed current gym program. Helped him find his  old gym program.   Manual: trigger point release to lumbar spine; skilled palpation of trigger points   Leg press 3x12 25 lbs  Knee flexion 3x12 10 lbs  Shoulder extension cables 3 x 12 10 pounds  NuStep 5 minutes reviewed proper set up    09/26 Trigger Point Dry-Needling  Treatment instructions: Expect mild to moderate muscle soreness. S/S of pneumothorax if dry needled over a lung field, and to seek immediate medical attention should they occur. Patient verbalized understanding of these instructions and education.  Patient Consent Given: Yes Education handout provided: Yes Muscles treated: 2x to bilateral gluteals using a .30x75 needle 1x in lower lumbar paraspinal on the right  Electrical stimulation performed: No Parameters: N/A Treatment response/outcome: great twitch   Reviewed current gym program. Helped him find his old gym program.   Knee extension 10 lbs 2x12  Hip abduction 40 lbs 2x12  Row 2x12 20 lbs  Tricpes press down 20 lbs 2x12   Reviewed prior weights vs current weights    9/18 Trigger Point Dry-Needling  Treatment instructions: Expect mild to moderate muscle soreness. S/S of pneumothorax if dry needled over a lung field,  and to seek immediate medical attention should they occur. Patient verbalized understanding of these instructions and education.  Patient Consent Given: Yes Education handout provided: Yes Muscles treated: 2x to bilateral gluteals using a .30x75 needle  Electrical stimulation performed: No Parameters: N/A Treatment response/outcome: great twitch     #15 LF Triceps Press  3x10 25 lbs REP of 5 for the last set 15 lbs RPE of 3  Cybex ( white ) Leg press 70 lbs 3x10 50 PRE of 3 70 RPE of 5  #12 [employer] Row machine 40 lbs REP of 5   Reviewed set up of each machine and use of RPE    Eval:  Access Code: [email or web address] URL: [email or web address]   Reviewed 3 exercises to do off current HEP   LTR x10  Glute stretch  2x20 bilateral  Forwards flexion table slide 5x 10 sec hold     PATIENT EDUCATION:  Education details: HEP, symptom management  Person educated: Patient Education method: Explanation, Demonstration, Tactile cues, Verbal cues, and Handouts Education comprehension: verbalized understanding, returned demonstration, verbal cues required, tactile cues required, and needs further education   HOME EXERCISE PROGRAM: Access Code: [email or web address] URL: [email or web address] Date: 11/29/2022 Prepared by: Lorayne Bender   Exercises - Supine Piriformis Stretch with Foot on Ground  - 2 x daily - 7 x weekly - 2 sets - 3 reps - 20 sec  hold - Supine Lower Trunk Rotation  - 2 x daily - 7 x weekly - 2 sets - 10 reps - Standing Glute Med Mobilization with Small Ball on Wall  - 1 x daily - 7 x weekly - 3 sets - 1 reps - 2-3 min  hold   ASSESSMENT:   CLINICAL IMPRESSION: The patient feels like the stretching is helping. We reviewed his home stretching. He was advised to do at home when he is feeling tight and painful. We had a great ttich with needling. We will continue to build his gym program as tolerated. We were able to advance weights today. No back pain following treatment.    EVAL: Patient is a 82 year old male who presents to therapy with low back pain that extends into his right gluteal.  He continues to have significant pain in the morning.Marland Kitchen He has increased pain with right side bending and right rotation. He has signiifcant spasming in his lower right lumbar spine and right gluteal. He has responded well to TPDN, manual therapy, and HEP. He has not been doing much of his HEP. He would like to get his pain level down and be able to return to the gym.    OBJECTIVE IMPAIRMENTS: Abnormal gait, decreased activity tolerance, difficulty walking, decreased ROM, decreased strength, increased muscle spasms, and pain.    ACTIVITY LIMITATIONS: lifting, bending, sitting, standing, squatting, stairs,  transfers, and locomotion level   PARTICIPATION LIMITATIONS: meal prep, cleaning, shopping, community activity, and yard work   PERSONAL FACTORS: Age and 1-2 comorbidities: left hip replacement  are also affecting patient's functional outcome.    REHAB POTENTIAL:  Good CLINICAL DECISION MAKING: Evolving/moderate complexity   EVALUATION COMPLEXITY: Low     GOALS: Goals reviewed with patient? Yes   SHORT TERM GOALS: Target date: 09/27/2023         Patient will demonstrate full lumbar rotation  and side bending without pain Baseline: Goal status: Continues to have minimal pain with endrange rotation to the right 10/3   2.  Patient will report a 50% reduction in pain  in the morning  Baseline:  Goal status: Improving 10/10    3.  Patient will increase gross left lower extremity strength by 10 pounds Baseline:  Goal status: INITIAL     LONG TERM GOALS: Target date: 10/25/2023       Patient will report complete resolution of spasming in the morning in order to improve his ability to perform ADLs Baseline:  Goal status: INITIAL   2.  Patient will have a complete exercise program including gym activity without increased pain. Baseline:  Goal status: INITIAL   3.  Left lower extremity strength will be within 5 pounds of right lower extremity strength to improve symmetry with gait Baseline:  Goal status: INITIAL PLAN:   PT FREQUENCY: 2x/week   PT DURATION: 8 weeks   PLANNED INTERVENTIONS: Therapeutic exercises, Therapeutic activity, Neuromuscular re-education, Balance training, Gait training, Patient/Family education, Self Care, Joint mobilization, Joint manipulation, DME instructions, Aquatic Therapy, Electrical stimulation, Cryotherapy, Moist heat, Taping, Ultrasound, and Manual therapy.   PLAN FOR NEXT SESSION: consider TPDN, Trigger point release to gluteals; review base strengthening exercises. Progress to gym exercises as tolerated.       Lorayne Bender PT  DPT 09/12/23 2:02 PM

## 2023-09-14 ENCOUNTER — Encounter (HOSPITAL_BASED_OUTPATIENT_CLINIC_OR_DEPARTMENT_OTHER): Payer: Self-pay | Admitting: Physical Therapy

## 2023-09-14 ENCOUNTER — Ambulatory Visit (HOSPITAL_BASED_OUTPATIENT_CLINIC_OR_DEPARTMENT_OTHER): Payer: Medicare Other | Admitting: Physical Therapy

## 2023-09-14 DIAGNOSIS — R2689 Other abnormalities of gait and mobility: Secondary | ICD-10-CM

## 2023-09-14 DIAGNOSIS — M25551 Pain in right hip: Secondary | ICD-10-CM

## 2023-09-14 DIAGNOSIS — M5459 Other low back pain: Secondary | ICD-10-CM

## 2023-09-14 DIAGNOSIS — M25552 Pain in left hip: Secondary | ICD-10-CM

## 2023-09-14 NOTE — Therapy (Signed)
OUTPATIENT PHYSICAL THERAPY THORACOLUMBAR EVALUATION   Patient Name: Jimmy Palmer MRN: [id number] DOB:[birth year], 82 y.o., male Today's Date: 09/14/2023  END OF SESSION:  PT End of Session - 09/14/23 1410     Visit Number 7    Number of Visits 16    Date for PT Re-Evaluation 10/03/23    PT Start Time 1345    PT Stop Time 1427    PT Time Calculation (min) 42 min    Activity Tolerance Patient tolerated treatment well    Behavior During Therapy [facility] for tasks assessed/performed             Past Medical History:  Diagnosis Date   Abnormal electrocardiogram    Abnormal heart sounds    Allergy 2020   upon arrival to [facility]   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   Chronic kidney disease 11/07/2021   Depression 2020   receiving appropriate therapy   Herniated lumbar intervertebral disc    Hypertension    Keratosis    benign   Left bundle branch block    Pacemaker    Pure hypercholesterolemia    Sick sinus syndrome (HCC)    Sleep apnea    VT (ventricular tachycardia) (HCC)    Past Surgical History:  Procedure Laterality Date   CARDIOVERSION N/A 09/28/2020   Procedure: CARDIOVERSION;  Surgeon: Elease Hashimoto Deloris Ping, MD;  Location: [facility] ENDOSCOPY;  Service: Cardiovascular;  Laterality: N/A;   INSERT / REPLACE / REMOVE PACEMAKER     JOINT REPLACEMENT     partial right knee, medial side   PPM GENERATOR CHANGEOUT N/A 04/30/2023   Procedure: PPM GENERATOR CHANGEOUT;  Surgeon: Marinus Maw, MD;  Location: [facility] INVASIVE CV LAB;  Service: Cardiovascular;  Laterality: N/A;   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   Procedure: LEFT TOTAL HIP ARTHROPLASTY ANTERIOR APPROACH;  Surgeon: Kathryne Hitch, MD;  Location: [facility] ORS;  Service: Orthopedics;  Laterality: Left;   VASECTOMY     Patient Active Problem List   Diagnosis Date Noted   Abscess of right index finger 02/28/2023   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   Left hip  pain 02/01/2022   Environmental and seasonal allergies 02/01/2022   Hypertension 11/07/2021   Chronic kidney disease 11/07/2021   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  PERTINENT HISTORY:  Arthritis, CKD, Herniated intervertebral discs, depression, Left BBB, THA left, VT   PAIN:  Are you having pain? Yes: NPRS scale: 0/10 in the morning when he got up  Pain location: Lumbar spine/ right hip  Pain description: aching  Aggravating factors: Mornings  Relieving factors: Heat and voltarin  PRECAUTIONS: None   WEIGHT BEARING RESTRICTIONS: No   FALLS:  Has patient fallen in last 6 months? 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 No acute findings. 2. 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 3. 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 Moderate bilateral neural foraminal narrowing at the L4-5 level right  worse than left. Minimal bilateral neural foraminal narrowing at L2-3 and L3-4. 4. Grade 1 anterolisthesis of L4 on L5 due to facet arthropathy.   PATIENT SURVEYS:  FOTO  46   SCREENING FOR RED FLAGS: Bowel or bladder incontinence: No Spinal tumors: No Cauda equina syndrome: No Compression fracture: No Abdominal aneurysm: No   COGNITION: Overall cognitive status: Within functional limits for tasks assessed                          SENSATION: Denies paresthesias    POSTURE: No Significant postural limitations   PALPATION: Spasming in the lumbar spine .    LUMBAR ROM:    AROM eval  Flexion Full   Extension Full   Right lateral flexion  mild pain   Left lateral flexion    Right rotation Limited 25% ith pain   Left rotation No pain    (Blank rows = not tested)   LOWER EXTREMITY ROM:      Passive  Right eval Left eval  Hip flexion Limit  No limit  Hip extension      Hip abduction      Hip adduction      Hip internal rotation Painful  Painful   Hip external rotation No limit No limit  Knee flexion      Knee extension      Ankle dorsiflexion      Ankle plantarflexion      Ankle inversion      Ankle eversion       (Blank rows = not tested)   LOWER EXTREMITY MMT:     MMT Right eval Left eval  Hip flexion 28.3 26.6  Hip extension      Hip abduction 45.1 36.1  Hip adduction      Hip internal rotation      Hip external rotation      Knee flexion      Knee extension 50.1 55.4  Ankle dorsiflexion      Ankle plantarflexion      Ankle inversion      Ankle eversion       (Blank rows = not tested)     GAIT: Lateral movement noted. Decreased hip flexion bilateral.  TODAY'S TREATMENT:  DATE:  10/18  L stretch 3x20 sec hold  Bilateral glute stretch 3x20 sec hold  LTR x15 each side  Manual: trigger point release to lumbar spine;  skilled palpation of trigger points   10/16 Trigger Point Dry-Needling  Treatment instructions: Expect mild to moderate muscle soreness. S/S of pneumothorax if dry needled over a lung field, and to seek immediate medical attention should they occur. Patient verbalized understanding of these instructions and education.  Patient Consent Given: Yes Education handout provided: Yes Muscles treated: 2x to bilateral gluteals using a .30x75 needle 1x in lower lumbar paraspinal on the right  Electrical stimulation performed: No Parameters: N/A Treatment response/outcome: great twitch   Reviewed current gym program. Helped him find his old gym program.   Manual: trigger point release to lumbar spine; skilled palpation of trigger points   Leg press 3x12 70 lbs cybex                       Cybex row 3x12 50 lbs                    LF hip abduction 70 lbs   LF knee extension 3x15 15 lbs  LF hip abduction 3x15 55 lbs      L stretch 3x20 sec hold  Bilateral glute stretch 3x20 sec hold  LTR x15 each side  Nu-step 5 min     Treatment                            10/9:  Trigger Point Dry Needling, Manual Therapy Treatment:  Initial or subsequent education regarding Trigger Point Dry Needling: Subsequent Did patient give consent to treatment with Trigger Point Dry Needling: Yes TPDN with skilled palpation and monitoring followed by STM to the following muscles: Rt glut max, med  Prone Lt lumbar stretching by PT.  Child pose Seated piriformis & HS stretches Bridge on heels- ball bw knees, arms reaching up 90/90 heel taps Lower trunk rotation Lateral step up 4", fwd step up 4"   10/3 Trigger Point Dry-Needling  Treatment instructions: Expect mild to moderate muscle soreness. S/S of pneumothorax if dry needled over a lung field, and to seek immediate medical attention should they occur. Patient verbalized understanding of these instructions and education.  Patient Consent Given:  Yes Education handout provided: Yes Muscles treated: 2x to bilateral gluteals using a .30x75 needle 1x in lower lumbar paraspinal on the right  Electrical stimulation performed: No Parameters: N/A Treatment response/outcome: great twitch   Reviewed current gym program. Helped him find his old gym program.   Manual: trigger point release to lumbar spine; skilled palpation of trigger points   Leg press 3x12 25 lbs  Knee flexion 3x12 10 lbs  Shoulder extension cables 3 x 12 10 pounds  NuStep 5 minutes reviewed proper set up    09/26 Trigger Point Dry-Needling  Treatment instructions: Expect mild to moderate muscle soreness. S/S of pneumothorax if dry needled over a lung field, and to seek immediate medical attention should they occur. Patient verbalized understanding of these instructions and education.  Patient Consent Given: Yes Education handout provided: Yes Muscles treated: 2x to bilateral gluteals using a .30x75 needle 1x in lower lumbar paraspinal on the right  Electrical stimulation performed: No Parameters: N/A Treatment response/outcome: great twitch   Reviewed current gym program. Helped him find his old gym program.   Knee extension 10 lbs 2x12  Hip abduction 40 lbs 2x12  Row 2x12 20 lbs  Tricpes press down 20 lbs 2x12   Reviewed prior weights vs current weights    9/18 Trigger Point Dry-Needling  Treatment instructions: Expect mild to moderate muscle soreness. S/S of pneumothorax if dry needled over a lung field, and to seek immediate medical attention should they occur. Patient verbalized understanding of these instructions and education.  Patient Consent Given: Yes Education handout provided: Yes Muscles treated: 2x to bilateral gluteals using a .30x75 needle  Electrical stimulation performed: No Parameters: N/A Treatment response/outcome: great twitch     #15 LF Triceps Press  3x10 25 lbs REP of 5 for the last set 15 lbs RPE of 3  Cybex ( white )  Leg press 70 lbs 3x10 50 PRE of 3 70 RPE of 5  #12 [employer] Row machine 40 lbs REP of 5   Reviewed set up of each machine and use of RPE    Eval:  Access Code: [email or web address] URL: [email or web address]   Reviewed 3 exercises to do off current HEP   LTR x10  Glute stretch 2x20 bilateral  Forwards flexion table slide 5x 10 sec hold     PATIENT EDUCATION:  Education details: HEP, symptom management  Person educated: Patient Education method: Explanation, Demonstration, Tactile cues, Verbal cues, and Handouts Education comprehension: verbalized understanding, returned demonstration, verbal cues required, tactile cues required, and needs further education   HOME EXERCISE PROGRAM: Access Code: [email or web address] URL: [email or web address] Date: 11/29/2022 Prepared by: Lorayne Bender   Exercises - Supine Piriformis Stretch with Foot on Ground  - 2 x daily - 7 x weekly - 2 sets - 3 reps - 20 sec  hold - Supine Lower Trunk Rotation  - 2 x daily - 7 x weekly - 2 sets - 10 reps - Standing Glute Med Mobilization with Small Ball on Wall  - 1 x daily - 7 x weekly - 3 sets - 1 reps - 2-3 min  hold   ASSESSMENT:   CLINICAL IMPRESSION: The patient continues to progress well. He had less spasming in his lumbar spine today. We continue to progress his exercises in the gym. He has began setting up his machines on his own. He does well with counter stretch. We reviewed stretches today. We will continue to progress as tolerated.   EVAL: Patient is a 82 year old male who presents to therapy with low back pain that extends into his right gluteal.  He continues to have significant pain in the morning.Marland Kitchen He has increased pain with right side bending and right rotation. He has signiifcant spasming in his lower right lumbar spine and right gluteal. He has responded well to TPDN, manual therapy, and HEP. He has not been doing much of his HEP. He would like to get his pain level down and be  able to return to the gym.    OBJECTIVE IMPAIRMENTS: Abnormal gait, decreased activity tolerance, difficulty walking, decreased ROM, decreased strength, increased muscle spasms, and pain.    ACTIVITY LIMITATIONS: lifting, bending, sitting, standing, squatting, stairs, transfers, and locomotion level   PARTICIPATION LIMITATIONS: meal prep, cleaning, shopping, community activity, and yard work   PERSONAL FACTORS: Age and 1-2 comorbidities: left hip replacement  are also affecting patient's functional outcome.    REHAB POTENTIAL:  Good CLINICAL DECISION MAKING: Evolving/moderate complexity   EVALUATION COMPLEXITY: Low     GOALS: Goals reviewed with patient? Yes   SHORT TERM GOALS: Target date: 09/27/2023  Patient will demonstrate full lumbar rotation  and side bending without pain Baseline: Goal status: Continues to have minimal pain with endrange rotation to the right 10/3   2.  Patient will report a 50% reduction in pain  in the morning Baseline:  Goal status: Improving 10/10    3.  Patient will increase gross left lower extremity strength by 10 pounds Baseline:  Goal status: INITIAL     LONG TERM GOALS: Target date: 10/25/2023       Patient will report complete resolution of spasming in the morning in order to improve his ability to perform ADLs Baseline:  Goal status: INITIAL   2.  Patient will have a complete exercise program including gym activity without increased pain. Baseline:  Goal status: INITIAL   3.  Left lower extremity strength will be within 5 pounds of right lower extremity strength to improve symmetry with gait Baseline:  Goal status: INITIAL PLAN:   PT FREQUENCY: 2x/week   PT DURATION: 8 weeks   PLANNED INTERVENTIONS: Therapeutic exercises, Therapeutic activity, Neuromuscular re-education, Balance training, Gait training, Patient/Family education, Self Care, Joint mobilization, Joint manipulation, DME instructions, Aquatic Therapy,  Electrical stimulation, Cryotherapy, Moist heat, Taping, Ultrasound, and Manual therapy.   PLAN FOR NEXT SESSION: consider TPDN, Trigger point release to gluteals; review base strengthening exercises. Progress to gym exercises as tolerated.       Lorayne Bender PT DPT 09/14/23 2:17 PM

## 2023-09-14 NOTE — Telephone Encounter (Signed)
Spoke with pt who states he has received Eliquis without any issue.  Pt thanked R for follow up call.

## 2023-09-18 ENCOUNTER — Telehealth: Payer: Self-pay | Admitting: Pharmacy Technician

## 2023-09-18 NOTE — Telephone Encounter (Signed)
Letter received from [employer] requesting contact from prescribing physician's office for Eliquis.  Spoke with [employer] Rep who states pt's PA will expire on 10/06/2023 and new authorization will be needed prior to this date. Will forward to our prior authorization team for further assistance.

## 2023-09-18 NOTE — Telephone Encounter (Signed)
Pharmacy Patient Advocate Encounter  Received notification from [employer] that Prior Authorization for eliquis has been APPROVED from 08/19/23 to 09/17/24   PA #/Case ID/Reference #: [id number]

## 2023-09-19 ENCOUNTER — Ambulatory Visit (HOSPITAL_BASED_OUTPATIENT_CLINIC_OR_DEPARTMENT_OTHER): Payer: Medicare Other | Admitting: Physical Therapy

## 2023-09-19 ENCOUNTER — Encounter (HOSPITAL_BASED_OUTPATIENT_CLINIC_OR_DEPARTMENT_OTHER): Payer: Self-pay | Admitting: Physical Therapy

## 2023-09-19 DIAGNOSIS — R2689 Other abnormalities of gait and mobility: Secondary | ICD-10-CM

## 2023-09-19 DIAGNOSIS — M5459 Other low back pain: Secondary | ICD-10-CM

## 2023-09-19 NOTE — Therapy (Signed)
OUTPATIENT PHYSICAL THERAPY THORACOLUMBAR EVALUATION   Patient Name: Jimmy Palmer MRN: [id number] DOB:08/26/1941, 82 y.o., male Today's Date: 09/19/2023  END OF SESSION:  PT End of Session - 09/19/23 1317     Visit Number 8    Number of Visits 16    Date for PT Re-Evaluation 10/03/23    Authorization Type progress note and Kx at 10    PT Start Time 1317    PT Stop Time 1347    PT Time Calculation (min) 30 min    Activity Tolerance Patient tolerated treatment well    Behavior During Therapy [facility] for tasks assessed/performed              Past Medical History:  Diagnosis Date   Abnormal electrocardiogram    Abnormal heart sounds    Allergy 2020   upon arrival to [facility]   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   Chronic kidney disease 11/07/2021   Depression 2020   receiving appropriate therapy   Herniated lumbar intervertebral disc    Hypertension    Keratosis    benign   Left bundle branch block    Pacemaker    Pure hypercholesterolemia    Sick sinus syndrome (HCC)    Sleep apnea    VT (ventricular tachycardia) (HCC)    Past Surgical History:  Procedure Laterality Date   CARDIOVERSION N/A 09/28/2020   Procedure: CARDIOVERSION;  Surgeon: Elease Hashimoto Deloris Ping, MD;  [facility]: [facility];  Service: Cardiovascular;  Laterality: N/A;   INSERT / REPLACE / REMOVE PACEMAKER     JOINT REPLACEMENT     partial right knee, medial side   PPM GENERATOR CHANGEOUT N/A 04/30/2023   Procedure: PPM GENERATOR CHANGEOUT;  Surgeon: Marinus Maw, MD;  Location: [facility] INVASIVE CV LAB;  Service: Cardiovascular;  Laterality: N/A;   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   Procedure: LEFT TOTAL HIP ARTHROPLASTY ANTERIOR APPROACH;  Surgeon: Kathryne Hitch, MD;  Location: [facility];  Service: Orthopedics;  Laterality: Left;   VASECTOMY     Patient Active Problem List   Diagnosis Date Noted   Abscess of right index finger 02/28/2023   Status post total replacement of left hip  05/05/2022   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   Left hip pain 02/01/2022   Environmental and seasonal allergies 02/01/2022   Hypertension 11/07/2021   Chronic kidney disease 11/07/2021   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   Atrial tachycardia (HCC) 08/26/2020   Pacemaker - MDT 08/26/2020   PCP: Dr Marcy Salvo [place]    REFERRING PROVIDER: Dr Tyrell Antonio     REFERRING DIAG:  Diagnosis  M43.16 (ICD-10-CM) - Spondylolisthesis, lumbar region      Rationale for Evaluation and Treatment: Rehabilitation   THERAPY DIAG:  Pain in left hip   Other abnormalities of gait and mobility   Status post total replacement of left hip   Stiffness of left hip, not elsewhere classified   ONSET DATE:    SUBJECTIVE:  SUBJECTIVE STATEMENT: First thing in the AM if everything is stiff it is difficult to step when getting out of bed. After doing the stretches I had 3-4 mornings where it didn't hurt. Not doing the stretches regularly because I have been so busy but it does hurt more in the AM.   PERTINENT HISTORY:  Arthritis, CKD, Herniated intervertebral discs, depression, Left BBB, THA left, VT   PAIN:  Are you having pain? Yes: NPRS scale: 0/10 in the morning when he got up  Pain location: Lumbar spine/ right hip  Pain description: aching  Aggravating factors: Mornings  Relieving factors: Heat and voltarin  PRECAUTIONS: None   WEIGHT BEARING RESTRICTIONS: No   FALLS:  Has patient fallen in last 6 months? 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 No acute findings. 2. Mild-to-moderate spondylosis throughout  the lumbar spine to include facet arthropathy. 3. 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 Moderate bilateral neural foraminal narrowing at the L4-5 level right worse than left. Minimal bilateral neural foraminal narrowing at L2-3 and L3-4. 4. Grade 1 anterolisthesis of L4 on L5 due to facet arthropathy.   PATIENT SURVEYS:  FOTO  46   SCREENING FOR RED FLAGS: Bowel or bladder incontinence: No Spinal tumors: No Cauda equina syndrome: No Compression fracture: No Abdominal aneurysm: No   COGNITION: Overall cognitive status: Within functional limits for tasks assessed                          SENSATION: Denies paresthesias    POSTURE: No Significant postural limitations   PALPATION: Spasming in the lumbar spine .    LUMBAR ROM:    AROM eval  Flexion Full   Extension Full   Right lateral flexion  mild pain   Left lateral flexion    Right rotation Limited 25% ith pain   Left rotation No pain    (Blank rows = not tested)   LOWER EXTREMITY ROM:      Passive  Right eval Left eval  Hip flexion Limit  No limit  Hip extension      Hip abduction      Hip adduction      Hip internal rotation Painful  Painful   Hip external rotation No limit No limit  Knee flexion      Knee extension      Ankle dorsiflexion      Ankle plantarflexion      Ankle inversion      Ankle eversion       (Blank rows = not tested)   LOWER EXTREMITY MMT:     MMT Right eval Left eval  Hip flexion 28.3 26.6  Hip extension      Hip abduction 45.1 36.1  Hip adduction      Hip internal rotation      Hip external rotation      Knee flexion      Knee extension 50.1 55.4  Ankle dorsiflexion      Ankle plantarflexion      Ankle inversion      Ankle eversion       (Blank rows = not tested)  GAIT: Lateral movement noted. Decreased hip flexion bilateral.  TODAY'S TREATMENT:                                                                                                                               DATE:   Treatment                            10/23:  Seated hamstring and figure 4 stretch L pull off bar Slant board Reviews machines: leg press, row, lat pull down   10/18  L stretch 3x20 sec hold  Bilateral glute stretch 3x20 sec hold  LTR x15 each side  Manual: trigger point release to lumbar spine; skilled palpation of trigger points   Leg press 3x12 70 lbs cybex                       Cybex row 3x12 50 lbs                    LF hip abduction 70 lbs   10/16 Trigger Point Dry-Needling  Treatment instructions: Expect mild to moderate muscle soreness. S/S of pneumothorax if dry needled over a lung field, and to seek immediate medical attention should they occur. Patient verbalized understanding of these instructions and education.  Patient Consent Given: Yes Education handout provided: Yes Muscles treated: 2x to bilateral gluteals using a .30x75 needle 1x in lower lumbar paraspinal on the right  Electrical stimulation performed: No Parameters: N/A Treatment response/outcome: great twitch   Reviewed current gym program. Helped him find his old gym program.   Manual: trigger point release to lumbar spine; skilled palpation of trigger points   Leg press 3x12 70 lbs cybex                       Cybex row 3x12 50 lbs                    LF hip abduction 70 lbs   LF knee extension 3x15 15 lbs  LF hip abduction 3x15 55 lbs      L stretch 3x20 sec hold  Bilateral glute stretch 3x20 sec hold  LTR x15 each side  Nu-step 5 min      PATIENT EDUCATION:  Education details: HEP, symptom management  Person educated: Patient Education method: Explanation, Demonstration, Tactile cues, Verbal cues, and Handouts Education comprehension: verbalized understanding, returned demonstration, verbal cues required, tactile cues required, and needs further education   HOME EXERCISE PROGRAM: Access Code:  [email or web address] URL: [email or web address]  ASSESSMENT:   CLINICAL IMPRESSION:  Encouraged pt to continue with regular stretches throughout the day and to work them into activies instead of making extra time. Has 1 more appt and then will f/u in about 3 weeks for check of HEP/need for progressions.   EVAL: Patient is a 82 year old male who presents  to therapy with low back pain that extends into his right gluteal.  He continues to have significant pain in the morning.Marland Kitchen He has increased pain with right side bending and right rotation. He has signiifcant spasming in his lower right lumbar spine and right gluteal. He has responded well to TPDN, manual therapy, and HEP. He has not been doing much of his HEP. He would like to get his pain level down and be able to return to the gym.    OBJECTIVE IMPAIRMENTS: Abnormal gait, decreased activity tolerance, difficulty walking, decreased ROM, decreased strength, increased muscle spasms, and pain.    ACTIVITY LIMITATIONS: lifting, bending, sitting, standing, squatting, stairs, transfers, and locomotion level   PARTICIPATION LIMITATIONS: meal prep, cleaning, shopping, community activity, and yard work   PERSONAL FACTORS: Age and 1-2 comorbidities: left hip replacement  are also affecting patient's functional outcome.    REHAB POTENTIAL:  Good CLINICAL DECISION MAKING: Evolving/moderate complexity   EVALUATION COMPLEXITY: Low     GOALS: Goals reviewed with patient? Yes   SHORT TERM GOALS: Target date: 09/27/2023         Patient will demonstrate full lumbar rotation  and side bending without pain Baseline: Goal status: Continues to have minimal pain with endrange rotation to the right 10/3   2.  Patient will report a 50% reduction in pain  in the morning Baseline:  Goal status: Improving 10/10    3.  Patient will increase gross left lower extremity strength by 10 pounds Baseline:  Goal status: INITIAL     LONG TERM GOALS:  Target date: 10/25/2023       Patient will report complete resolution of spasming in the morning in order to improve his ability to perform ADLs Baseline:  Goal status: INITIAL   2.  Patient will have a complete exercise program including gym activity without increased pain. Baseline:  Goal status: INITIAL   3.  Left lower extremity strength will be within 5 pounds of right lower extremity strength to improve symmetry with gait Baseline:  Goal status: INITIAL PLAN:   PT FREQUENCY: 2x/week   PT DURATION: 8 weeks   PLANNED INTERVENTIONS: Therapeutic exercises, Therapeutic activity, Neuromuscular re-education, Balance training, Gait training, Patient/Family education, Self Care, Joint mobilization, Joint manipulation, DME instructions, Aquatic Therapy, Electrical stimulation, Cryotherapy, Moist heat, Taping, Ultrasound, and Manual therapy.   PLAN FOR NEXT SESSION: consider TPDN, Trigger point release to gluteals; review base strengthening exercises. Progress to gym exercises as tolerated.      [place] Mielke C. Tyreesha Maharaj PT, DPT 09/19/23 1:58 PM

## 2023-09-21 ENCOUNTER — Ambulatory Visit (HOSPITAL_BASED_OUTPATIENT_CLINIC_OR_DEPARTMENT_OTHER): Payer: Medicare Other | Admitting: Physical Therapy

## 2023-09-21 ENCOUNTER — Other Ambulatory Visit (HOSPITAL_BASED_OUTPATIENT_CLINIC_OR_DEPARTMENT_OTHER): Payer: Self-pay

## 2023-09-21 DIAGNOSIS — R2689 Other abnormalities of gait and mobility: Secondary | ICD-10-CM

## 2023-09-21 DIAGNOSIS — M5459 Other low back pain: Secondary | ICD-10-CM

## 2023-09-21 DIAGNOSIS — M25551 Pain in right hip: Secondary | ICD-10-CM

## 2023-09-21 DIAGNOSIS — M25552 Pain in left hip: Secondary | ICD-10-CM

## 2023-09-21 NOTE — Therapy (Signed)
OUTPATIENT PHYSICAL THERAPY THORACOLUMBAR EVALUATION   Patient Name: Jimmy Palmer MRN: [id number] DOB:[birth year], 82 y.o., male Today's Date: 09/21/2023  END OF SESSION:  PT End of Session - 09/21/23 1459     Visit Number 9    Number of Visits 16    Date for PT Re-Evaluation 10/03/23    Authorization Type progress note and Kx at 10    PT Start Time 1345    PT Stop Time 1428    PT Time Calculation (min) 43 min    Activity Tolerance Patient tolerated treatment well    Behavior During Therapy [facility] for tasks assessed/performed               Past Medical History:  Diagnosis Date   Abnormal electrocardiogram    Abnormal heart sounds    Allergy 2020   upon arrival to [facility]   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   Chronic kidney disease 11/07/2021   Depression 2020   receiving appropriate therapy   Herniated lumbar intervertebral disc    Hypertension    Keratosis    benign   Left bundle branch block    Pacemaker    Pure hypercholesterolemia    Sick sinus syndrome (HCC)    Sleep apnea    VT (ventricular tachycardia) (HCC)    Past Surgical History:  Procedure Laterality Date   CARDIOVERSION N/A 09/28/2020   Procedure: CARDIOVERSION;  Surgeon: Elease Hashimoto Deloris Ping, MD;  [facility];  Service: Cardiovascular;  Laterality: N/A;   INSERT / REPLACE / REMOVE PACEMAKER     JOINT REPLACEMENT     partial right knee, medial side   PPM GENERATOR CHANGEOUT N/A 04/30/2023   Procedure: PPM GENERATOR CHANGEOUT;  Surgeon: Marinus Maw, MD;  [facility]: [facility] INV[facility] CV LAB;  Service: Cardiovascular;  Laterality: N/A;   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   Procedure: LEFT TOTAL HIP ARTHROPLASTY ANTERIOR APPROACH;  Surgeon: Kathryne Hitch, MD;  Location: [facility];  Service: Orthopedics;  Laterality: Left;   VASECTOMY     Patient Active Problem List   Diagnosis Date Noted   Abscess of right index finger 02/28/2023   Status post total replacement of left hip  05/05/2022   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   Left hip pain 02/01/2022   Environmental and seasonal allergies 02/01/2022   Hypertension 11/07/2021   Chronic kidney disease 11/07/2021   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   Atrial tachycardia (HCC) 08/26/2020   Pacemaker - MDT 08/26/2020   PCP: Dr Marcy Salvo [place]    REFERRING PROVIDER: Dr Tyrell Antonio     REFERRING DIAG:  Diagnosis  M43.16 (ICD-10-CM) - Spondylolisthesis, lumbar region      Rationale for Evaluation and Treatment: Rehabilitation   THERAPY DIAG:  Pain in left hip   Other abnormalities of gait and mobility   Status post total replacement of left hip   Stiffness of left hip, not elsewhere classified   ONSET DATE:    SUBJECTIVE:  SUBJECTIVE STATEMENT: The patient continues to have some stiffness in the mornings  PERTINENT HISTORY:  Arthritis, CKD, Herniated intervertebral discs, depression, Left BBB, THA left, VT   PAIN:  Are you having pain? Yes: NPRS scale: 0/10 in the morning when he got up  Pain location: Lumbar spine/ right hip  Pain description: aching  Aggravating factors: Mornings  Relieving factors: Heat and voltarin  PRECAUTIONS: None   WEIGHT BEARING RESTRICTIONS: No   FALLS:  Has patient fallen in last 6 months? 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 No acute findings. 2. 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 3. 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 Moderate bilateral neural  foraminal narrowing at the L4-5 level right worse than left. Minimal bilateral neural foraminal narrowing at L2-3 and L3-4. 4. Grade 1 anterolisthesis of L4 on L5 due to facet arthropathy.   PATIENT SURVEYS:  FOTO  46   SCREENING FOR RED FLAGS: Bowel or bladder incontinence: No Spinal tumors: No Cauda equina syndrome: No Compression fracture: No Abdominal aneurysm: No   COGNITION: Overall cognitive status: Within functional limits for tasks assessed                          SENSATION: Denies paresthesias    POSTURE: No Significant postural limitations   PALPATION: Spasming in the lumbar spine .    LUMBAR ROM:    AROM eval  Flexion Full   Extension Full   Right lateral flexion  mild pain   Left lateral flexion    Right rotation Limited 25% ith pain   Left rotation No pain    (Blank rows = not tested)   LOWER EXTREMITY ROM:      Passive  Right eval Left eval  Hip flexion Limit  No limit  Hip extension      Hip abduction      Hip adduction      Hip internal rotation Painful  Painful   Hip external rotation No limit No limit  Knee flexion      Knee extension      Ankle dorsiflexion      Ankle plantarflexion      Ankle inversion      Ankle eversion       (Blank rows = not tested)   LOWER EXTREMITY MMT:     MMT Right eval Left eval  Hip flexion 28.3 26.6  Hip extension      Hip abduction 45.1 36.1  Hip adduction      Hip internal rotation      Hip external rotation      Knee flexion      Knee extension 50.1 55.4  Ankle dorsiflexion      Ankle plantarflexion      Ankle inversion      Ankle eversion       (Blank rows = not tested)     GAIT: Lateral movement noted. Decreased hip flexion bilateral.  TODAY'S TREATMENT:  DATE:   10/25 Nu-step 5 min  LF triceps press down 40 pounds 2 x 15 Reviewed all Cybex machines in the  back row.  Review machines he should and should not do. Cybex leg press 2 x 1560 pounds Cybex row 2 x 15 15 pounds Cybex LAT pulldown 2 x 1515 pounds  Life fitness knee flexion 2 x 15 15 pounds  Life fitness knee extension 15 pounds 2 x 15  Treatment                            10/23:  Seated hamstring and figure 4 stretch L pull off bar Slant board Reviews machines: leg press, row, lat pull down   10/18  L stretch 3x20 sec hold  Bilateral glute stretch 3x20 sec hold  LTR x15 each side  Manual: trigger point release to lumbar spine; skilled palpation of trigger points   Leg press 3x12 70 lbs cybex                       Cybex row 3x12 50 lbs                    LF hip abduction 70 lbs   10/16 Trigger Point Dry-Needling  Treatment instructions: Expect mild to moderate muscle soreness. S/S of pneumothorax if dry needled over a lung field, and to seek immediate medical attention should they occur. Patient verbalized understanding of these instructions and education.  Patient Consent Given: Yes Education handout provided: Yes Muscles treated: 2x to bilateral gluteals using a .30x75 needle 1x in lower lumbar paraspinal on the right  Electrical stimulation performed: No Parameters: N/A Treatment response/outcome: great twitch   Reviewed current gym program. Helped him find his old gym program.   Manual: trigger point release to lumbar spine; skilled palpation of trigger points   Leg press 3x12 70 lbs cybex                       Cybex row 3x12 50 lbs                    LF hip abduction 70 lbs   LF knee extension 3x15 15 lbs  LF hip abduction 3x15 55 lbs      L stretch 3x20 sec hold  Bilateral glute stretch 3x20 sec hold  LTR x15 each side  Nu-step 5 min      PATIENT EDUCATION:  Education details: HEP, symptom management  Person educated: Patient Education method: Explanation, Demonstration, Tactile cues, Verbal cues, and Handouts Education comprehension:  verbalized understanding, returned demonstration, verbal cues required, tactile cues required, and needs further education   HOME EXERCISE PROGRAM: Access Code: [email or web address] URL: [email or web address]  ASSESSMENT:   CLINICAL IMPRESSION:  Therapy reviewed machines in the back. He already has a full program of life fitness machines. He has a full sheet. He was advised he dosen't really need more on his sheet but we reviewed how to do them. We also got him set up in the right start program next week. He will work on his execises for a few week then we will review his program and progression.  EVAL: Patient is a 82 year old male who presents to therapy with low back pain that extends into his right gluteal.  He continues to have significant pain in the morning.Marland Kitchen He has increased pain with right side bending and right rotation.  He has signiifcant spasming in his lower right lumbar spine and right gluteal. He has responded well to TPDN, manual therapy, and HEP. He has not been doing much of his HEP. He would like to get his pain level down and be able to return to the gym.    OBJECTIVE IMPAIRMENTS: Abnormal gait, decreased activity tolerance, difficulty walking, decreased ROM, decreased strength, increased muscle spasms, and pain.    ACTIVITY LIMITATIONS: lifting, bending, sitting, standing, squatting, stairs, transfers, and locomotion level   PARTICIPATION LIMITATIONS: meal prep, cleaning, shopping, community activity, and yard work   PERSONAL FACTORS: Age and 1-2 comorbidities: left hip replacement  are also affecting patient's functional outcome.    REHAB POTENTIAL:  Good CLINICAL DECISION MAKING: Evolving/moderate complexity   EVALUATION COMPLEXITY: Low     GOALS: Goals reviewed with patient? Yes   SHORT TERM GOALS: Target date: 09/27/2023         Patient will demonstrate full lumbar rotation  and side bending without pain Baseline: Goal status: Continues to have  minimal pain with endrange rotation to the right 10/3   2.  Patient will report a 50% reduction in pain  in the morning Baseline:  Goal status: Improving 10/10    3.  Patient will increase gross left lower extremity strength by 10 pounds Baseline:  Goal status: INITIAL     LONG TERM GOALS: Target date: 10/25/2023       Patient will report complete resolution of spasming in the morning in order to improve his ability to perform ADLs Baseline:  Goal status: INITIAL   2.  Patient will have a complete exercise program including gym activity without increased pain. Baseline:  Goal status: INITIAL   3.  Left lower extremity strength will be within 5 pounds of right lower extremity strength to improve symmetry with gait Baseline:  Goal status: INITIAL PLAN:   PT FREQUENCY: 2x/week   PT DURATION: 8 weeks   PLANNED INTERVENTIONS: Therapeutic exercises, Therapeutic activity, Neuromuscular re-education, Balance training, Gait training, Patient/Family education, Self Care, Joint mobilization, Joint manipulation, DME instructions, Aquatic Therapy, Electrical stimulation, Cryotherapy, Moist heat, Taping, Ultrasound, and Manual therapy.   PLAN FOR NEXT SESSION: consider TPDN, Trigger point release to gluteals; review base strengthening exercises. Progress to gym exercises as tolerated.      Jessica C. Hightower PT, DPT 09/21/23 3:03 PM

## 2023-09-25 ENCOUNTER — Other Ambulatory Visit (HOSPITAL_BASED_OUTPATIENT_CLINIC_OR_DEPARTMENT_OTHER): Payer: Self-pay

## 2023-09-28 ENCOUNTER — Other Ambulatory Visit (HOSPITAL_BASED_OUTPATIENT_CLINIC_OR_DEPARTMENT_OTHER): Payer: Self-pay | Admitting: Family Medicine

## 2023-10-01 ENCOUNTER — Other Ambulatory Visit (HOSPITAL_BASED_OUTPATIENT_CLINIC_OR_DEPARTMENT_OTHER): Payer: Self-pay

## 2023-10-01 MED ORDER — BUPROPION HCL ER (SR) 150 MG PO TB12
150.0000 mg | ORAL_TABLET | Freq: Every morning | ORAL | 1 refills | Status: DC
  Filled 2023-10-01: qty 90, 90d supply, fill #0

## 2023-10-02 ENCOUNTER — Other Ambulatory Visit (HOSPITAL_BASED_OUTPATIENT_CLINIC_OR_DEPARTMENT_OTHER): Payer: Self-pay | Admitting: Family Medicine

## 2023-10-02 ENCOUNTER — Other Ambulatory Visit (HOSPITAL_BASED_OUTPATIENT_CLINIC_OR_DEPARTMENT_OTHER): Payer: Self-pay

## 2023-10-03 ENCOUNTER — Other Ambulatory Visit (HOSPITAL_BASED_OUTPATIENT_CLINIC_OR_DEPARTMENT_OTHER): Payer: Self-pay

## 2023-10-03 MED ORDER — SERTRALINE HCL 25 MG PO TABS
25.0000 mg | ORAL_TABLET | Freq: Every day | ORAL | 0 refills | Status: DC
  Filled 2023-10-03: qty 30, 30d supply, fill #0

## 2023-10-03 NOTE — Telephone Encounter (Signed)
Please have this Medication Refilled ----He only has 2 days left ---

## 2023-10-04 ENCOUNTER — Other Ambulatory Visit (HOSPITAL_BASED_OUTPATIENT_CLINIC_OR_DEPARTMENT_OTHER): Payer: Self-pay

## 2023-10-04 ENCOUNTER — Encounter (HOSPITAL_BASED_OUTPATIENT_CLINIC_OR_DEPARTMENT_OTHER): Payer: Self-pay | Admitting: Family Medicine

## 2023-10-04 ENCOUNTER — Ambulatory Visit (INDEPENDENT_AMBULATORY_CARE_PROVIDER_SITE_OTHER): Payer: Medicare Other | Admitting: Family Medicine

## 2023-10-04 VITALS — BP 118/74 | HR 94 | Ht 69.0 in | Wt 167.4 lb

## 2023-10-04 DIAGNOSIS — D649 Anemia, unspecified: Secondary | ICD-10-CM

## 2023-10-04 DIAGNOSIS — E785 Hyperlipidemia, unspecified: Secondary | ICD-10-CM | POA: Diagnosis not present

## 2023-10-04 DIAGNOSIS — F32A Depression, unspecified: Secondary | ICD-10-CM | POA: Diagnosis not present

## 2023-10-04 DIAGNOSIS — N1831 Chronic kidney disease, stage 3a: Secondary | ICD-10-CM

## 2023-10-04 HISTORY — DX: Anemia, unspecified: D64.9

## 2023-10-04 MED ORDER — BUPROPION HCL ER (SR) 150 MG PO TB12
150.0000 mg | ORAL_TABLET | Freq: Every morning | ORAL | 3 refills | Status: DC
  Filled 2023-10-04 – 2023-12-27 (×3): qty 90, 90d supply, fill #0
  Filled 2024-03-28: qty 90, 90d supply, fill #1
  Filled 2024-06-29: qty 90, 90d supply, fill #2
  Filled 2024-09-29: qty 90, 90d supply, fill #3

## 2023-10-04 MED ORDER — SERTRALINE HCL 25 MG PO TABS
25.0000 mg | ORAL_TABLET | Freq: Every day | ORAL | 3 refills | Status: DC
  Filled 2023-10-04 – 2023-10-30 (×2): qty 90, 90d supply, fill #0
  Filled 2024-01-22: qty 90, 90d supply, fill #1
  Filled 2024-04-22: qty 90, 90d supply, fill #2
  Filled 2024-07-21: qty 90, 90d supply, fill #3

## 2023-10-04 NOTE — Progress Notes (Signed)
    Procedures performed today:    None.  Independent interpretation of notes and tests performed by another provider:   None.  Brief History, Exam, Impression, and Recommendations:    BP 118/74 (BP Location: Right Arm, Patient Position: Sitting, Cuff Size: Normal)   Pulse 94   Ht 5\' 9"  (1.753 m)   Wt 167 lb 6.4 oz (75.9 kg)   SpO2 95%   BMI 24.72 kg/m   Depression, unspecified depression type Assessment & Plan: Patient continues with Wellbutrin and sertraline.  Reports good control of symptoms with current medication regimen.  He has been utilizing this medication for a number of years with good success.  Given progress with this regimen, can continue, refill sent to pharmacy on file and return to office as needed if any new symptoms or issues with medication arise, otherwise we will plan for follow-up in 6 months   Anemia, unspecified type Assessment & Plan: Observed on prior labs, did improve on most recent labs earlier this year.  We will plan to monitor this near time of follow-up visit about 6 months or sooner as needed  Orders: -     CBC with Differential/Platelet; Future  Stage 3a chronic kidney disease (HCC) Assessment & Plan: Creatinine stable on labs earlier this year completed with his cardiologist.  Will plan to recheck labs in conjunction with follow-up visit next year.  Orders: -     Basic metabolic panel; Future  Hyperlipidemia, unspecified hyperlipidemia type Assessment & Plan: Patient continues with simvastatin, denies any current issues, no reported myalgias.  We will plan to recheck lipid panel prior to next appointment in about 6 months for monitoring  Orders: -     Lipid panel; Future  Other orders -     Sertraline HCl; Take 1 tablet (25 mg total) by mouth daily.  Dispense: 90 tablet; Refill: 3 -     buPROPion HCl ER (SR); Take 1 tablet (150 mg total) by mouth every morning.  Dispense: 90 tablet; Refill: 3  Return in about 6 months (around  04/02/2024) for med check, lab follow-up.   ___________________________________________ Laquetta Racey de [place], MD, ABFM, CAQSM Primary Care and Sports Medicine [facility]

## 2023-10-04 NOTE — Assessment & Plan Note (Signed)
Observed on prior labs, did improve on most recent labs earlier this year.  We will plan to monitor this near time of follow-up visit about 6 months or sooner as needed

## 2023-10-04 NOTE — Assessment & Plan Note (Signed)
Patient continues with simvastatin, denies any current issues, no reported myalgias.  We will plan to recheck lipid panel prior to next appointment in about 6 months for monitoring

## 2023-10-04 NOTE — Assessment & Plan Note (Signed)
Patient continues with Wellbutrin and sertraline.  Reports good control of symptoms with current medication regimen.  He has been utilizing this medication for a number of years with good success.  Given progress with this regimen, can continue, refill sent to pharmacy on file and return to office as needed if any new symptoms or issues with medication arise, otherwise we will plan for follow-up in 6 months

## 2023-10-04 NOTE — Assessment & Plan Note (Signed)
Creatinine stable on labs earlier this year completed with his cardiologist.  Will plan to recheck labs in conjunction with follow-up visit next year.

## 2023-10-18 ENCOUNTER — Encounter (HOSPITAL_BASED_OUTPATIENT_CLINIC_OR_DEPARTMENT_OTHER): Payer: Self-pay | Admitting: Family Medicine

## 2023-10-18 ENCOUNTER — Ambulatory Visit (HOSPITAL_BASED_OUTPATIENT_CLINIC_OR_DEPARTMENT_OTHER): Payer: Medicare Other | Attending: Physical Medicine and Rehabilitation | Admitting: Physical Therapy

## 2023-10-18 ENCOUNTER — Other Ambulatory Visit (HOSPITAL_BASED_OUTPATIENT_CLINIC_OR_DEPARTMENT_OTHER): Payer: Self-pay

## 2023-10-18 ENCOUNTER — Encounter (HOSPITAL_BASED_OUTPATIENT_CLINIC_OR_DEPARTMENT_OTHER): Payer: Self-pay | Admitting: Physical Therapy

## 2023-10-18 DIAGNOSIS — M5459 Other low back pain: Secondary | ICD-10-CM | POA: Diagnosis present

## 2023-10-18 NOTE — Therapy (Signed)
OUTPATIENT PHYSICAL THERAPY THORACOLUMBAR Tx/Discharge   Patient Name: Jimmy Palmer MRN: [id number] DOB:06-25-1941, 82 y.o., male Today's Date: 10/18/2023  END OF SESSION:  PT End of Session - 10/18/23 1420     Visit Number 10    Number of Visits 16    Date for PT Re-Evaluation 10/18/23    Authorization Type progress note and Kx at 10    PT Start Time 1345    PT Stop Time 1415    PT Time Calculation (min) 30 min    Activity Tolerance Patient tolerated treatment well    Behavior During Therapy [facility] for tasks assessed/performed                Past Medical History:  Diagnosis Date   Abnormal electrocardiogram    Abnormal heart sounds    Allergy 2020   upon arrival to [facility]   Anemia 10/04/2023   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   Chronic kidney disease 11/07/2021   Depression 2020   receiving appropriate therapy   Herniated lumbar intervertebral disc    Hypertension    Keratosis    benign   Left bundle branch block    Pacemaker    Pure hypercholesterolemia    Sick sinus syndrome (HCC)    Sleep apnea    VT (ventricular tachycardia) (HCC)    Past Surgical History:  Procedure Laterality Date   CARDIOVERSION N/A 09/28/2020   Procedure: CARDIOVERSION;  Surgeon: Elease Hashimoto Deloris Ping, MD;  Location: [facility] ENDOSCOPY;  Service: Cardiovascular;  Laterality: N/A;   hip replacement Left    INSERT / REPLACE / REMOVE PACEMAKER     JOINT REPLACEMENT     partial right knee, medial side   PPM GENERATOR CHANGEOUT N/A 04/30/2023   Procedure: PPM GENERATOR CHANGEOUT;  Surgeon: Marinus Maw, MD;  Location: [facility] INVASIVE CV LAB;  Service: Cardiovascular;  Laterality: N/A;   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   Procedure: LEFT TOTAL HIP ARTHROPLASTY ANTERIOR APPROACH;  Surgeon: Kathryne Hitch, MD;  Location: [facility] ORS;  Service: Orthopedics;  Laterality: Left;   VASECTOMY     Patient Active Problem List   Diagnosis Date Noted   Depression 10/04/2023   Anemia  10/04/2023   Hyperlipidemia 10/04/2023   Abscess of right index finger 02/28/2023   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   Left hip pain 02/01/2022   Environmental and seasonal allergies 02/01/2022   Hypertension 11/07/2021   Chronic kidney disease 11/07/2021   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   Atrial tachycardia (HCC) 08/26/2020   Pacemaker - MDT 08/26/2020   PCP: Dr Marcy Salvo [place]    REFERRING PROVIDER: Dr Tyrell Antonio     REFERRING DIAG:  Diagnosis  M43.16 (ICD-10-CM) - Spondylolisthesis, lumbar region      Rationale for Evaluation and Treatment: Rehabilitation   THERAPY DIAG:  Pain in left hip   Other abnormalities of gait and mobility   Status post total replacement of left hip   Stiffness of left hip, not elsewhere classified   ONSET DATE:    SUBJECTIVE:  Progress Note Reporting Period 08/08/23 to 10/18/2023   See note below for Objective Data and Assessment of Progress/Goals.     SUBJECTIVE STATEMENT: The Right Start program has been very helpful. Not having the extended pain in the AM.   PERTINENT HISTORY:  Arthritis, CKD, Herniated intervertebral discs, depression, Left BBB, THA left, VT   PAIN:  Are you having pain? Yes: NPRS scale: 0/10 in the morning when he got up  Pain location: Lumbar spine/ right hip  Pain description: aching  Aggravating factors: Mornings  Relieving factors: Heat and voltarin  PRECAUTIONS: None   WEIGHT BEARING RESTRICTIONS: No   FALLS:  Has patient fallen in last 6 months? 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 No acute findings. 2. 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 3. 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 Moderate bilateral neural foraminal narrowing at the L4-5 level right worse than left. Minimal bilateral neural foraminal narrowing at L2-3 and L3-4. 4. Grade 1 anterolisthesis of L4 on L5 due to facet arthropathy.   PATIENT SURVEYS:  FOTO:  46 11/21:   67   SCREENING FOR RED FLAGS: Bowel or bladder incontinence: No Spinal tumors: No Cauda equina syndrome: No Compression fracture: No Abdominal aneurysm: No   COGNITION: Overall cognitive status: Within functional limits for tasks assessed                          SENSATION: Denies paresthesias    POSTURE: No Significant postural limitations   PALPATION: Spasming in the lumbar spine .    LUMBAR ROM:    AROM eval 11/21  Flexion Full    Extension Full    Right lateral flexion  mild pain  No pain  Left lateral flexion     Right rotation Limited 25% ith pain  No pain, full  Left rotation No pain     (Blank rows = not tested)   LOWER EXTREMITY ROM:      Passive  Right eval Left eval 11/21  Hip flexion Limit  No limit   Hip extension       Hip abduction       Hip adduction       Hip internal rotation Painful  Painful  No pain  Hip external rotation No limit No limit   Knee flexion       Knee extension       Ankle dorsiflexion       Ankle plantarflexion       Ankle inversion       Ankle eversion        (Blank rows = not tested)   LOWER EXTREMITY MMT:     MMT (lb) Right eval Left eval 11/21   Hip flexion 28.3 26.6   Hip extension       Hip abduction 45.1 36.1 Sidelying: Lt 26.8 Rt 32.6  Hip adduction       Hip internal rotation       Hip external rotation       Knee flexion       Knee extension 50.1 55.4   Ankle dorsiflexion  Ankle  plantarflexion       Ankle inversion       Ankle eversion        (Blank rows = not tested)     GAIT: Lateral movement noted. Decreased hip flexion bilateral.  TODAY'S TREATMENT:                                                                                                                              DATE:   Treatment                            11/21:  See plan   10/25 Nu-step 5 min  LF triceps press down 40 pounds 2 x 15 Reviewed all Cybex machines in the back row.  Review machines he should and should not do. Cybex leg press 2 x 1560 pounds Cybex row 2 x 15 15 pounds Cybex LAT pulldown 2 x 1515 pounds  Life fitness knee flexion 2 x 15 15 pounds  [employer] fitness knee extension 15 pounds 2 x 15  Treatment                            10/23:  Seated hamstring and figure 4 stretch L pull off bar Slant board Reviews machines: leg press, row, lat pull down   10/18  L stretch 3x20 sec hold  Bilateral glute stretch 3x20 sec hold  LTR x15 each side  Manual: trigger point release to lumbar spine; skilled palpation of trigger points   Leg press 3x12 70 lbs cybex                       Cybex row 3x12 50 lbs                    LF hip abduction 70 lbs   10/16 Trigger Point Dry-Needling  Treatment instructions: Expect mild to moderate muscle soreness. S/S of pneumothorax if dry needled over a lung field, and to seek immediate medical attention should they occur. Patient verbalized understanding of these instructions and education.  Patient Consent Given: Yes Education handout provided: Yes Muscles treated: 2x to bilateral gluteals using a .30x75 needle 1x in lower lumbar paraspinal on the right  Electrical stimulation performed: No Parameters: N/A Treatment response/outcome: great twitch   Reviewed current gym program. Helped him find his old gym program.   Manual: trigger point release to lumbar spine; skilled palpation of trigger points   Leg press 3x12 70 lbs cybex                        Cybex row 3x12 50 lbs                    LF hip abduction 70 lbs   LF knee extension 3x15 15 lbs  LF hip abduction 3x15 55 lbs      L stretch 3x20 sec hold  Bilateral glute stretch 3x20 sec hold  LTR x15 each side  Nu-step 5 min      PATIENT EDUCATION:  Education details: HEP, symptom management  Person educated: Patient Education method: Explanation, Demonstration, Tactile cues, Verbal cues, and Handouts Education comprehension: verbalized understanding, returned demonstration, verbal cues required, tactile cues required, and needs further education   HOME EXERCISE PROGRAM: Access Code: [email or web address] URL: [email or web address]  ASSESSMENT:   CLINICAL IMPRESSION: Pt has met all of his goals at this time and is prepared for d/c to independent exercise program. Is set up with transition by continuing with Right start program and will continue for the time allowed before being fully independent.   EVAL: Patient is a 82 year old male who presents to therapy with low back pain that extends into his right gluteal.  He continues to have significant pain in the morning.Marland Kitchen He has increased pain with right side bending and right rotation. He has signiifcant spasming in his lower right lumbar spine and right gluteal. He has responded well to TPDN, manual therapy, and HEP. He has not been doing much of his HEP. He would like to get his pain level down and be able to return to the gym.    OBJECTIVE IMPAIRMENTS: Abnormal gait, decreased activity tolerance, difficulty walking, decreased ROM, decreased strength, increased muscle spasms, and pain.    ACTIVITY LIMITATIONS: lifting, bending, sitting, standing, squatting, stairs, transfers, and locomotion level   PARTICIPATION LIMITATIONS: meal prep, cleaning, shopping, community activity, and yard work   PERSONAL FACTORS: Age and 1-2 comorbidities: left hip replacement  are also affecting patient's functional outcome.     REHAB POTENTIAL:  Good CLINICAL DECISION MAKING: Evolving/moderate complexity   EVALUATION COMPLEXITY: Low     GOALS: Goals reviewed with patient? Yes   SHORT TERM GOALS: Target date: 09/27/2023         Patient will demonstrate full lumbar rotation  and side bending without pain Baseline: Goal status: Continues to have minimal pain with endrange rotation to the right 10/3   2.  Patient will report a 50% reduction in pain  in the morning Baseline:  Goal status: Improving 10/10    3.  Patient will increase gross left lower extremity strength by 10 pounds Baseline:  Goal status: achieved     LONG TERM GOALS: Target date: 10/25/2023       Patient will report complete resolution of spasming in the morning in order to improve his ability to perform ADLs Baseline:  Goal status: achieved   2.  Patient will have a complete exercise program including gym activity without increased pain. Baseline:  Goal status: achieved   3.  Left lower extremity strength will be within 5 pounds of right lower extremity strength to improve symmetry with gait Baseline:  Goal status: achieved PLAN:   PT FREQUENCY: 2x/week   PT DURATION: 8 weeks   PLANNED INTERVENTIONS: Therapeutic exercises, Therapeutic activity, Neuromuscular re-education, Balance training, Gait training, Patient/Family education, Self Care, Joint mobilization, Joint manipulation, DME instructions, Aquatic Therapy, Electrical stimulation, Cryotherapy, Moist heat, Taping, Ultrasound, and Manual therapy.      [place] Natarajan C. Sidonia Nutter PT, DPT 10/18/23 2:21 PM

## 2023-10-22 ENCOUNTER — Other Ambulatory Visit (HOSPITAL_BASED_OUTPATIENT_CLINIC_OR_DEPARTMENT_OTHER): Payer: Self-pay

## 2023-10-30 ENCOUNTER — Other Ambulatory Visit (HOSPITAL_BASED_OUTPATIENT_CLINIC_OR_DEPARTMENT_OTHER): Payer: Self-pay

## 2023-10-30 ENCOUNTER — Ambulatory Visit (INDEPENDENT_AMBULATORY_CARE_PROVIDER_SITE_OTHER): Payer: Medicare Other

## 2023-10-30 DIAGNOSIS — I442 Atrioventricular block, complete: Secondary | ICD-10-CM | POA: Diagnosis not present

## 2023-10-31 LAB — CUP PACEART REMOTE DEVICE CHECK
Battery Remaining Longevity: 89 mo
Battery Voltage: 3.06 V
Brady Statistic AP VP Percent: 27.02 %
Brady Statistic AP VS Percent: 0.13 %
Brady Statistic AS VP Percent: 72.29 %
Brady Statistic AS VS Percent: 0.55 %
Brady Statistic RA Percent Paced: 26.81 %
Brady Statistic RV Percent Paced: 99.31 %
Date Time Interrogation Session: 20241203021925
Implantable Lead Connection Status: 753985
Implantable Lead Connection Status: 753985
Implantable Lead Implant Date: 20050208
Implantable Lead Implant Date: 20050208
Implantable Lead Location: 753859
Implantable Lead Location: 753860
Implantable Lead Model: 5076
Implantable Lead Model: 5076
Implantable Pulse Generator Implant Date: 20240603
Lead Channel Impedance Value: 266 Ohm
Lead Channel Impedance Value: 323 Ohm
Lead Channel Impedance Value: 380 Ohm
Lead Channel Impedance Value: 418 Ohm
Lead Channel Pacing Threshold Amplitude: 1.25 V
Lead Channel Pacing Threshold Amplitude: 1.625 V
Lead Channel Pacing Threshold Pulse Width: 0.4 ms
Lead Channel Pacing Threshold Pulse Width: 0.4 ms
Lead Channel Sensing Intrinsic Amplitude: 10 mV
Lead Channel Sensing Intrinsic Amplitude: 10 mV
Lead Channel Sensing Intrinsic Amplitude: 2.875 mV
Lead Channel Sensing Intrinsic Amplitude: 2.875 mV
Lead Channel Setting Pacing Amplitude: 2.75 V
Lead Channel Setting Pacing Amplitude: 3 V
Lead Channel Setting Pacing Pulse Width: 0.6 ms
Lead Channel Setting Sensing Sensitivity: 4 mV
Zone Setting Status: 755011

## 2023-11-02 ENCOUNTER — Other Ambulatory Visit (HOSPITAL_BASED_OUTPATIENT_CLINIC_OR_DEPARTMENT_OTHER): Payer: Self-pay

## 2023-11-16 ENCOUNTER — Other Ambulatory Visit (HOSPITAL_BASED_OUTPATIENT_CLINIC_OR_DEPARTMENT_OTHER): Payer: Self-pay

## 2023-11-29 IMAGING — DX DG HIP (WITH OR WITHOUT PELVIS) 4+V*L*
4 series · 4 of 4 positions shown · non-contrast
Comparison: None.

CLINICAL DATA: Left hip pain.

EXAM:
DG HIP (WITH OR WITHOUT PELVIS) 4+V LEFT

[pelvis ap]
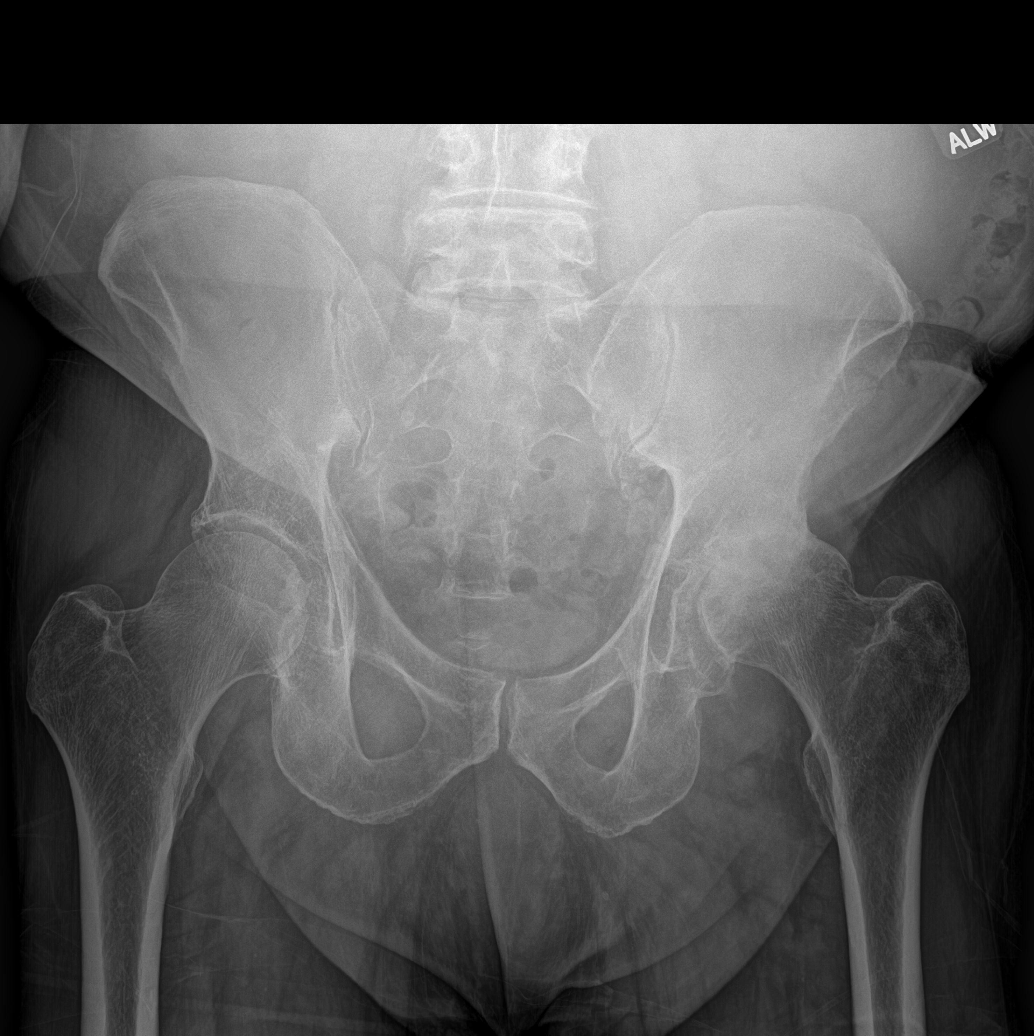

[hip ap]
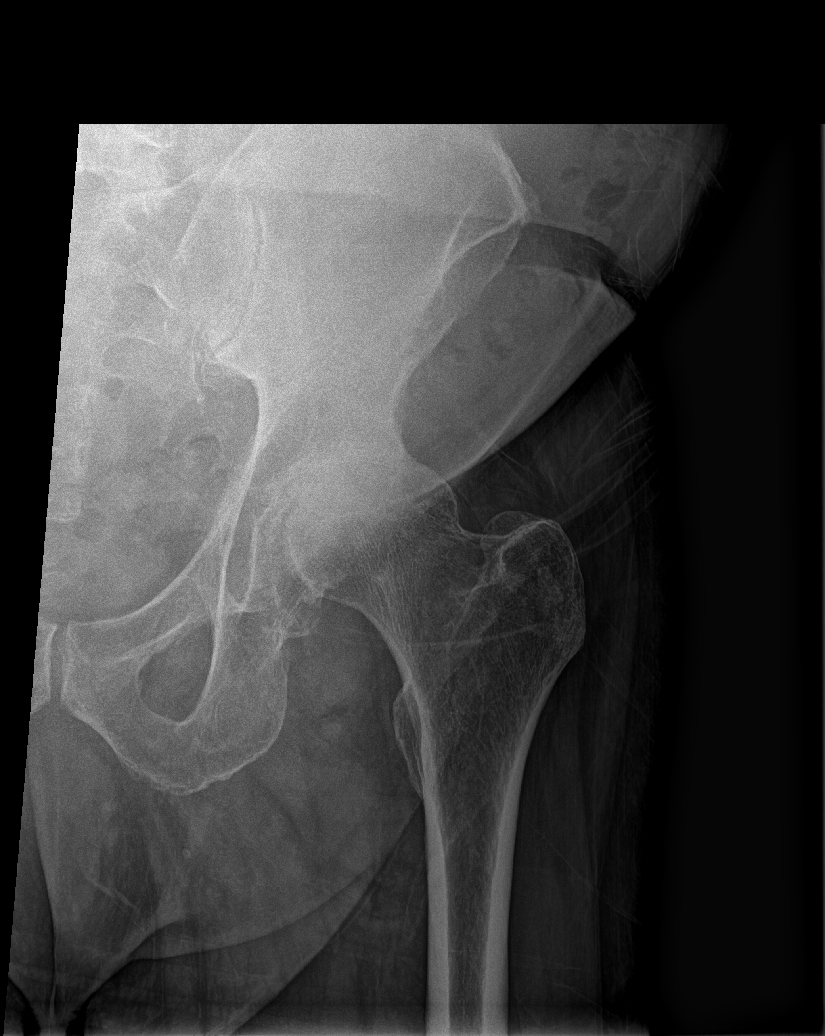

[hip lat]
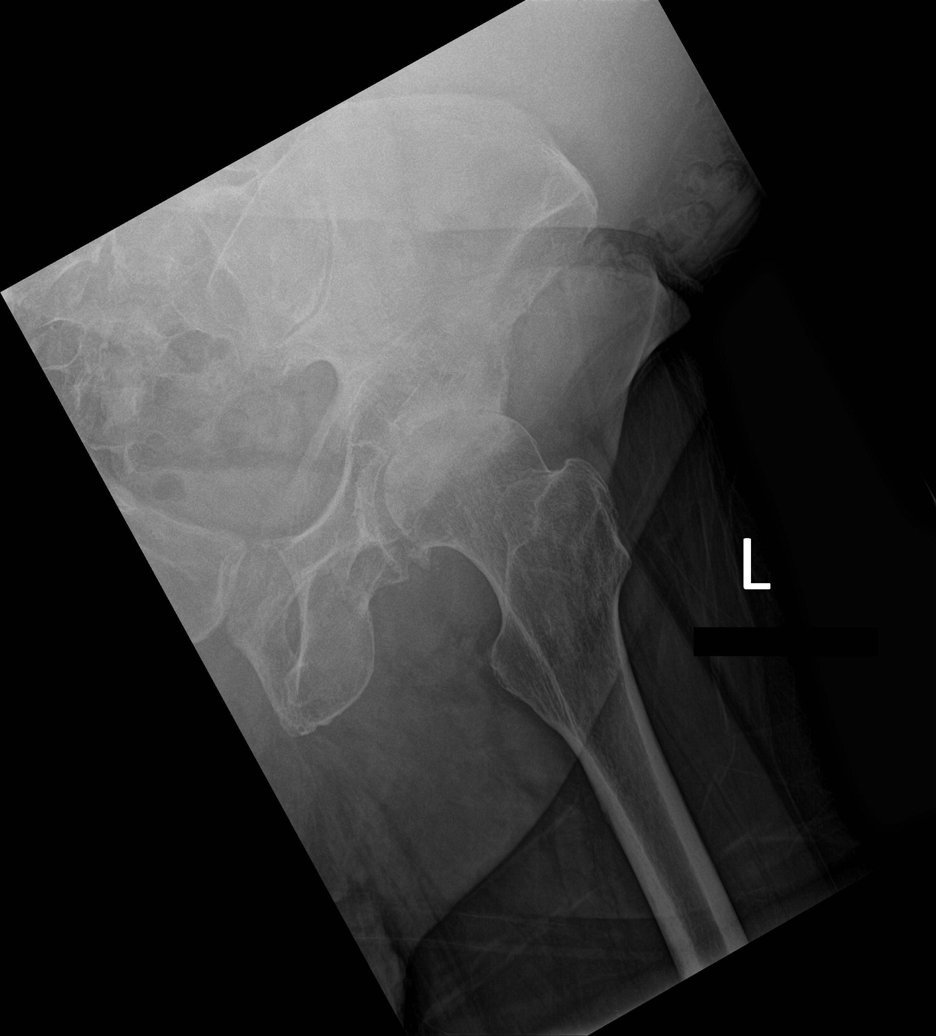

[hip axial]
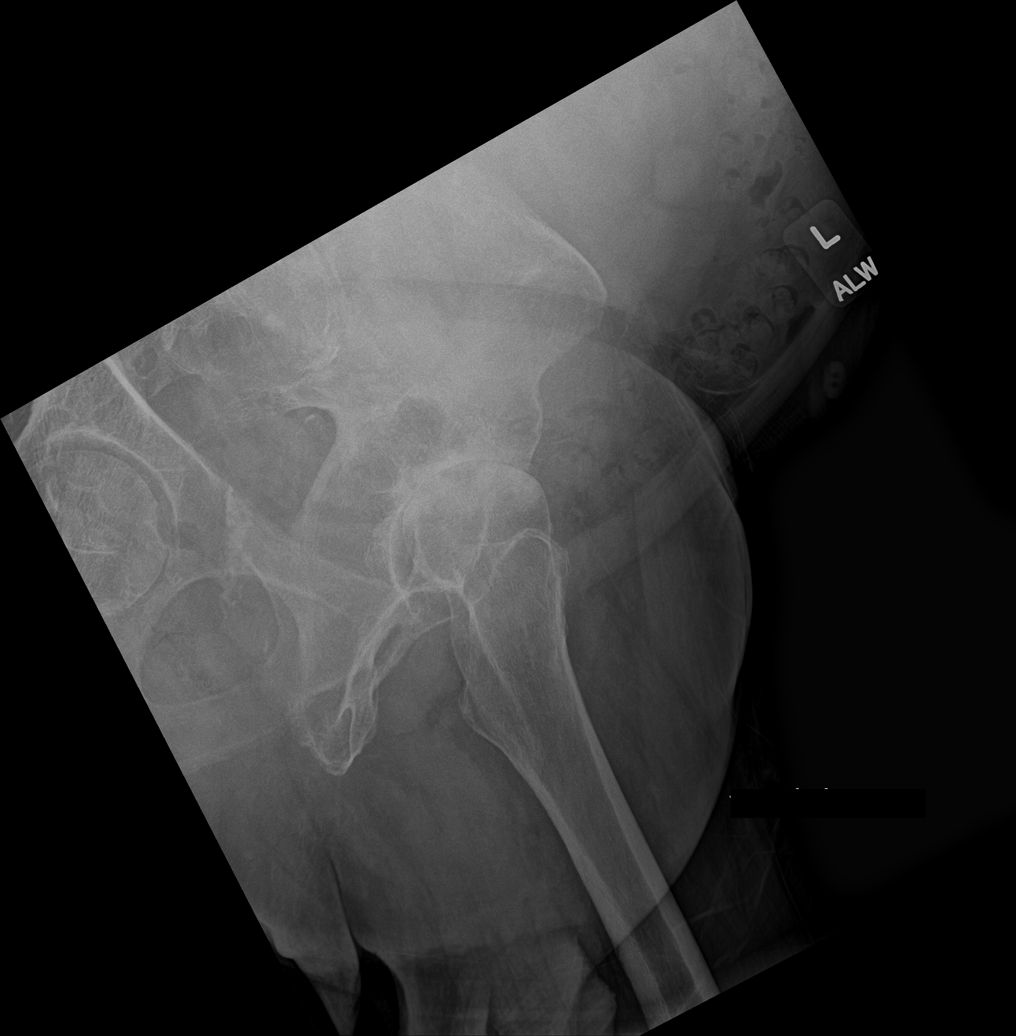

[4 of 4 positions shown; findings below may reference images not displayed]

FINDINGS: There is diffuse decreased bone mineralization. Severe left
femoroacetabular osteoarthritis with superior bone-on-bone contact,
subchondral sclerosis, subchondral degenerative cystic changes and
mild superior acetabular and femoral head cortical
flattening/remodeling.

Mild bilateral sacroiliac joint subchondral sclerosis degenerative
change.

Mild-to-moderate right superomedial femoroacetabular joint space
narrowing.

The pubic symphysis joint space is maintained.

No acute fracture or dislocation.
IMPRESSION: Markedly severe left femoroacetabular osteoarthritis.

## 2023-12-04 ENCOUNTER — Other Ambulatory Visit: Payer: Self-pay

## 2023-12-04 ENCOUNTER — Other Ambulatory Visit (HOSPITAL_BASED_OUTPATIENT_CLINIC_OR_DEPARTMENT_OTHER): Payer: Self-pay

## 2023-12-27 ENCOUNTER — Other Ambulatory Visit (HOSPITAL_BASED_OUTPATIENT_CLINIC_OR_DEPARTMENT_OTHER): Payer: Self-pay | Admitting: Family Medicine

## 2023-12-27 ENCOUNTER — Other Ambulatory Visit (HOSPITAL_BASED_OUTPATIENT_CLINIC_OR_DEPARTMENT_OTHER): Payer: Self-pay

## 2023-12-27 MED ORDER — SIMVASTATIN 40 MG PO TABS
40.0000 mg | ORAL_TABLET | Freq: Every evening | ORAL | 1 refills | Status: DC
  Filled 2023-12-27: qty 90, 90d supply, fill #0
  Filled 2024-03-23: qty 90, 90d supply, fill #1

## 2024-01-13 ENCOUNTER — Other Ambulatory Visit (HOSPITAL_BASED_OUTPATIENT_CLINIC_OR_DEPARTMENT_OTHER): Payer: Self-pay | Admitting: Family Medicine

## 2024-01-13 DIAGNOSIS — I442 Atrioventricular block, complete: Secondary | ICD-10-CM

## 2024-01-14 ENCOUNTER — Other Ambulatory Visit (HOSPITAL_BASED_OUTPATIENT_CLINIC_OR_DEPARTMENT_OTHER): Payer: Self-pay

## 2024-01-14 MED ORDER — DOXAZOSIN MESYLATE 4 MG PO TABS
4.0000 mg | ORAL_TABLET | Freq: Every day | ORAL | 1 refills | Status: DC
Start: 2024-01-14 — End: 2024-07-21
  Filled 2024-01-14: qty 90, 90d supply, fill #0
  Filled 2024-04-22: qty 90, 90d supply, fill #1

## 2024-01-22 ENCOUNTER — Encounter (HOSPITAL_BASED_OUTPATIENT_CLINIC_OR_DEPARTMENT_OTHER): Payer: Medicare Other

## 2024-01-22 ENCOUNTER — Other Ambulatory Visit (HOSPITAL_BASED_OUTPATIENT_CLINIC_OR_DEPARTMENT_OTHER): Payer: Self-pay

## 2024-01-29 ENCOUNTER — Ambulatory Visit (INDEPENDENT_AMBULATORY_CARE_PROVIDER_SITE_OTHER): Payer: Medicare Other

## 2024-01-29 DIAGNOSIS — I442 Atrioventricular block, complete: Secondary | ICD-10-CM

## 2024-01-31 LAB — CUP PACEART REMOTE DEVICE CHECK
Battery Remaining Longevity: 86 mo
Battery Voltage: 3.02 V
Brady Statistic AP VP Percent: 26.62 %
Brady Statistic AP VS Percent: 0.08 %
Brady Statistic AS VP Percent: 73.05 %
Brady Statistic AS VS Percent: 0.25 %
Brady Statistic RA Percent Paced: 26.27 %
Brady Statistic RV Percent Paced: 99.67 %
Date Time Interrogation Session: 20250304064546
Implantable Lead Connection Status: 753985
Implantable Lead Connection Status: 753985
Implantable Lead Implant Date: 20050208
Implantable Lead Implant Date: 20050208
Implantable Lead Location: 753859
Implantable Lead Location: 753860
Implantable Lead Model: 5076
Implantable Lead Model: 5076
Implantable Pulse Generator Implant Date: 20240603
Lead Channel Impedance Value: 266 Ohm
Lead Channel Impedance Value: 323 Ohm
Lead Channel Impedance Value: 380 Ohm
Lead Channel Impedance Value: 399 Ohm
Lead Channel Pacing Threshold Amplitude: 1.25 V
Lead Channel Pacing Threshold Amplitude: 1.625 V
Lead Channel Pacing Threshold Pulse Width: 0.4 ms
Lead Channel Pacing Threshold Pulse Width: 0.4 ms
Lead Channel Sensing Intrinsic Amplitude: 10 mV
Lead Channel Sensing Intrinsic Amplitude: 10 mV
Lead Channel Sensing Intrinsic Amplitude: 2.625 mV
Lead Channel Sensing Intrinsic Amplitude: 2.625 mV
Lead Channel Setting Pacing Amplitude: 2.5 V
Lead Channel Setting Pacing Amplitude: 3 V
Lead Channel Setting Pacing Pulse Width: 0.6 ms
Lead Channel Setting Sensing Sensitivity: 4 mV
Zone Setting Status: 755011

## 2024-02-07 ENCOUNTER — Other Ambulatory Visit (HOSPITAL_BASED_OUTPATIENT_CLINIC_OR_DEPARTMENT_OTHER): Payer: Self-pay | Admitting: Family Medicine

## 2024-02-07 ENCOUNTER — Other Ambulatory Visit (HOSPITAL_BASED_OUTPATIENT_CLINIC_OR_DEPARTMENT_OTHER): Payer: Self-pay

## 2024-02-07 MED ORDER — LOSARTAN POTASSIUM-HCTZ 100-12.5 MG PO TABS
1.0000 | ORAL_TABLET | Freq: Every day | ORAL | 1 refills | Status: DC
  Filled 2024-02-07: qty 90, 90d supply, fill #0
  Filled 2024-05-09 – 2024-05-10 (×2): qty 90, 90d supply, fill #1

## 2024-02-09 ENCOUNTER — Other Ambulatory Visit (HOSPITAL_BASED_OUTPATIENT_CLINIC_OR_DEPARTMENT_OTHER): Payer: Self-pay

## 2024-02-13 ENCOUNTER — Other Ambulatory Visit (HOSPITAL_BASED_OUTPATIENT_CLINIC_OR_DEPARTMENT_OTHER): Payer: Self-pay | Admitting: Family Medicine

## 2024-02-13 ENCOUNTER — Other Ambulatory Visit (HOSPITAL_BASED_OUTPATIENT_CLINIC_OR_DEPARTMENT_OTHER): Payer: Self-pay

## 2024-02-13 DIAGNOSIS — J3089 Other allergic rhinitis: Secondary | ICD-10-CM

## 2024-02-13 MED ORDER — FEXOFENADINE HCL 180 MG PO TABS
180.0000 mg | ORAL_TABLET | Freq: Every day | ORAL | 1 refills | Status: DC
  Filled 2024-02-13 (×2): qty 30, 30d supply, fill #0
  Filled 2024-03-10: qty 90, 90d supply, fill #1

## 2024-02-18 ENCOUNTER — Telehealth (HOSPITAL_BASED_OUTPATIENT_CLINIC_OR_DEPARTMENT_OTHER): Payer: Self-pay | Admitting: *Deleted

## 2024-02-18 NOTE — Telephone Encounter (Signed)
 LVM to schedule medicare wellness visit

## 2024-02-18 NOTE — Telephone Encounter (Signed)
 Copied from CRM [id number]. Topic: General - Other >> Feb 18, 2024 10:49 AM Carlatta H wrote: Reason for CRM: Patient received call from [place] to schedule Wellness Visit//Patient would like to schedule for 4/29@2 :30//Unable to schedule due to error

## 2024-02-18 NOTE — Telephone Encounter (Signed)
Patient scheduled 4-2

## 2024-02-23 IMAGING — XA DG HIP (WITH OR WITHOUT PELVIS) 1V*L*
1 series · 6 of 6 positions shown · non-contrast
Comparison: 02/08/2022

CLINICAL DATA: Left hip replacement

EXAM:
DG HIP (WITH OR WITHOUT PELVIS) 1V*L*

[Series 1: unknown protocol · 6 of 6 slices shown]
[im 1/6]
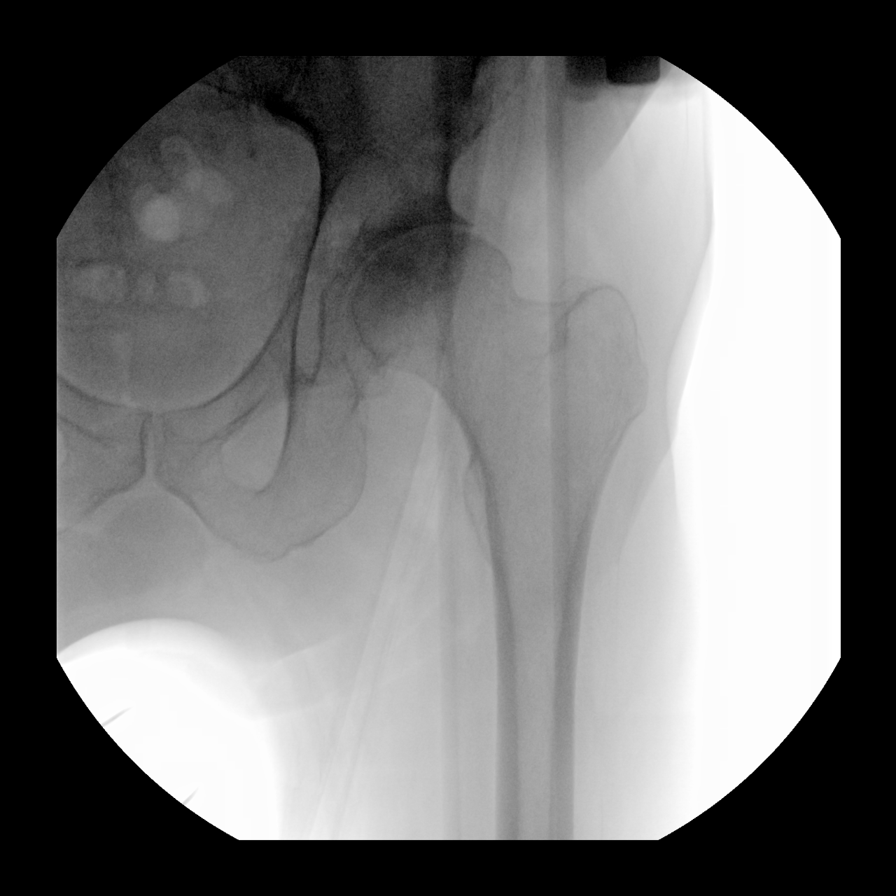
[im 2/6]
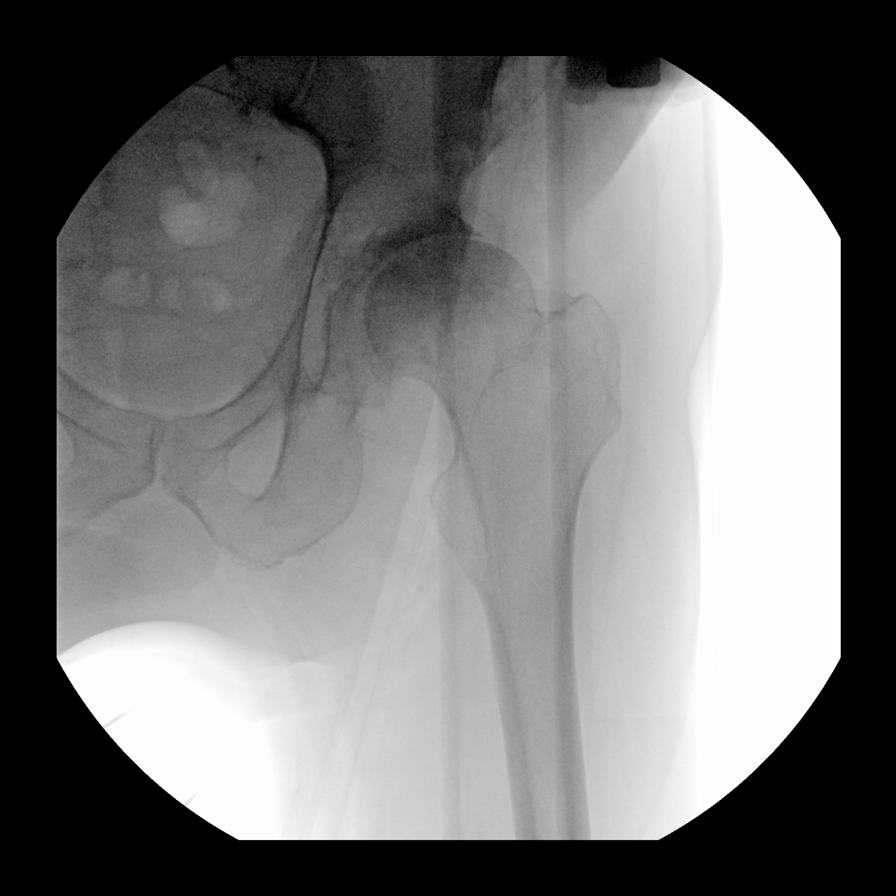
[im 3/6]
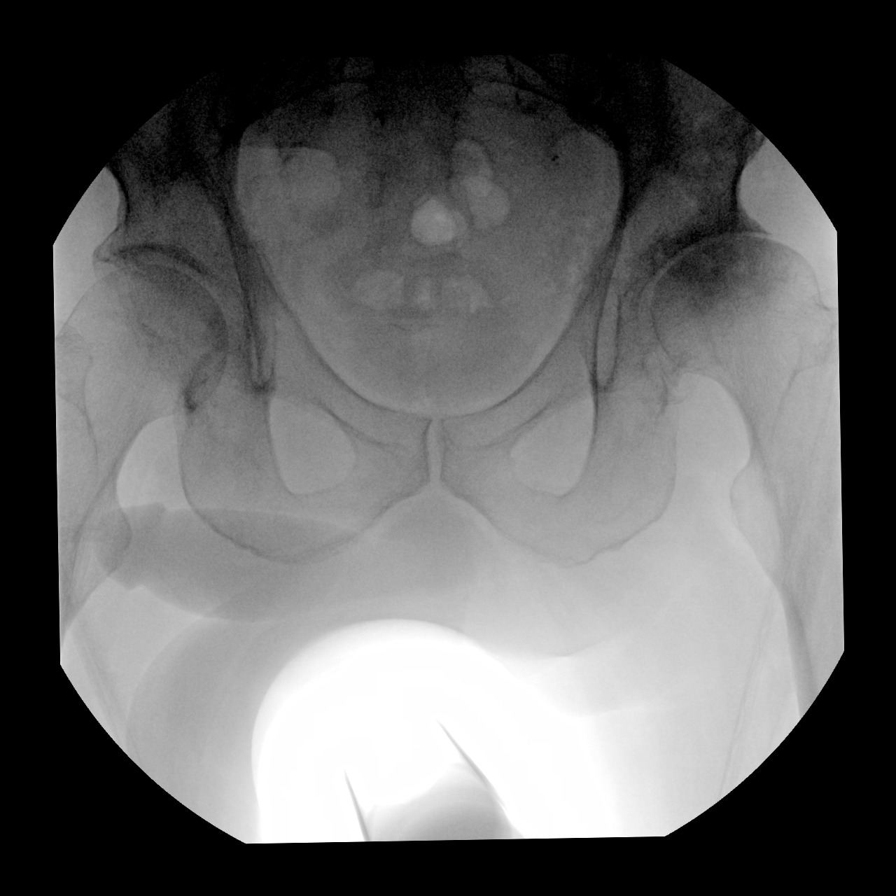
[im 4/6]
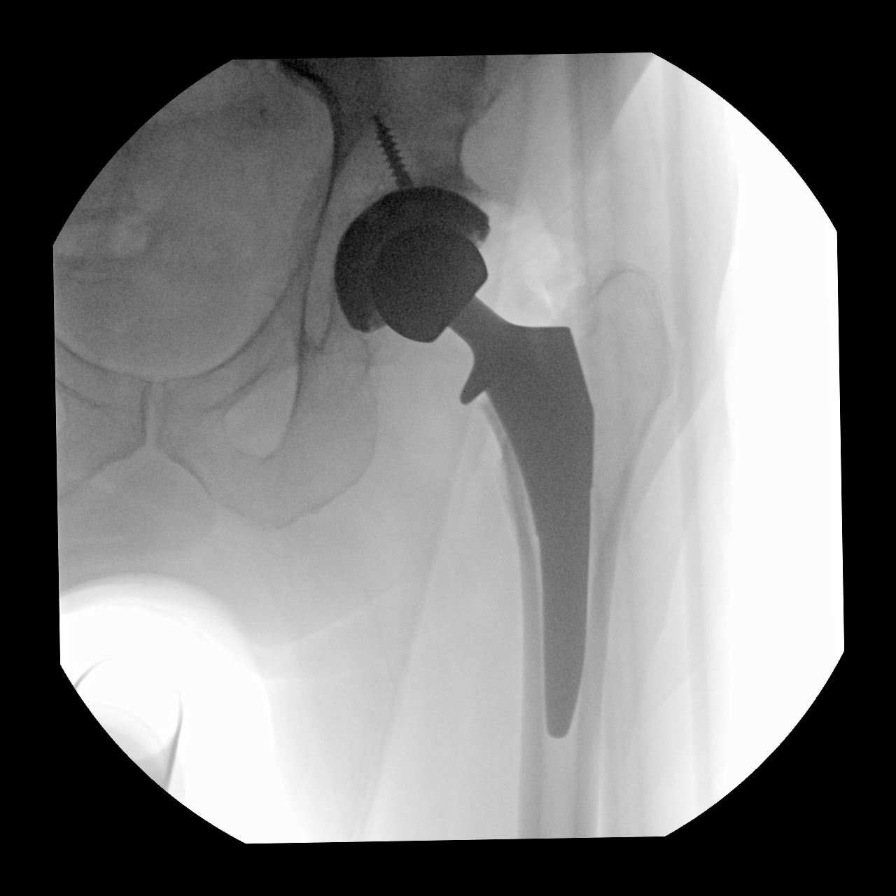
[im 5/6]
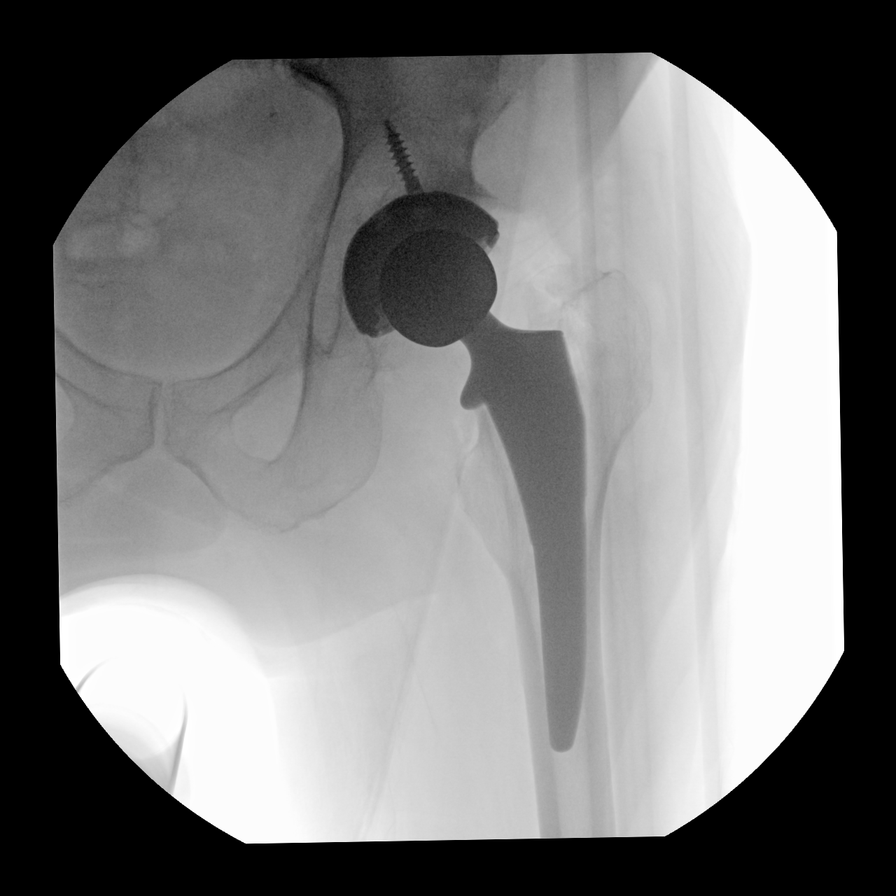
[im 6/6]
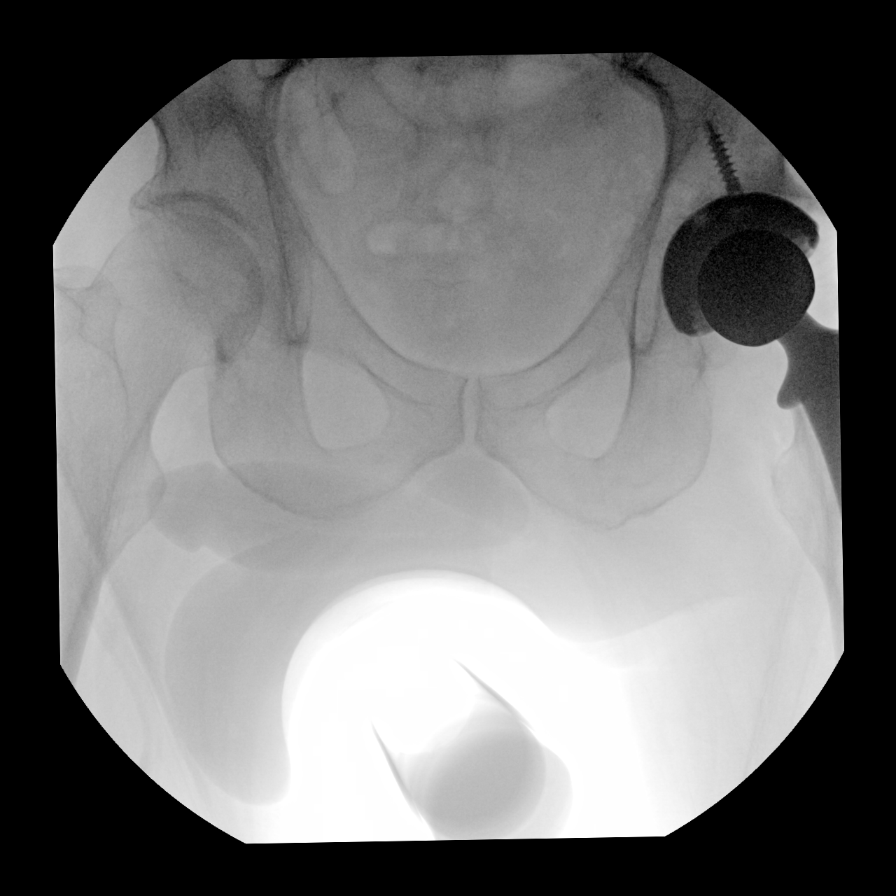

[6 of 6 positions shown; findings below may reference images not displayed]

FINDINGS: 6 intraoperative images. These demonstrate placement of a left hip
arthroplasty, without acute hardware complication or periprosthetic
fracture. The right femoral head is located.
IMPRESSION: Intraoperative imaging of left hip arthroplasty.

## 2024-02-23 IMAGING — DX DG PORTABLE PELVIS
1 series · 1 of 1 positions shown · non-contrast
Comparison: 02/08/2022.

CLINICAL DATA: Postop left hip replacement.

EXAM:
PORTABLE PELVIS 1-2 VIEWS

[pelvis ap]
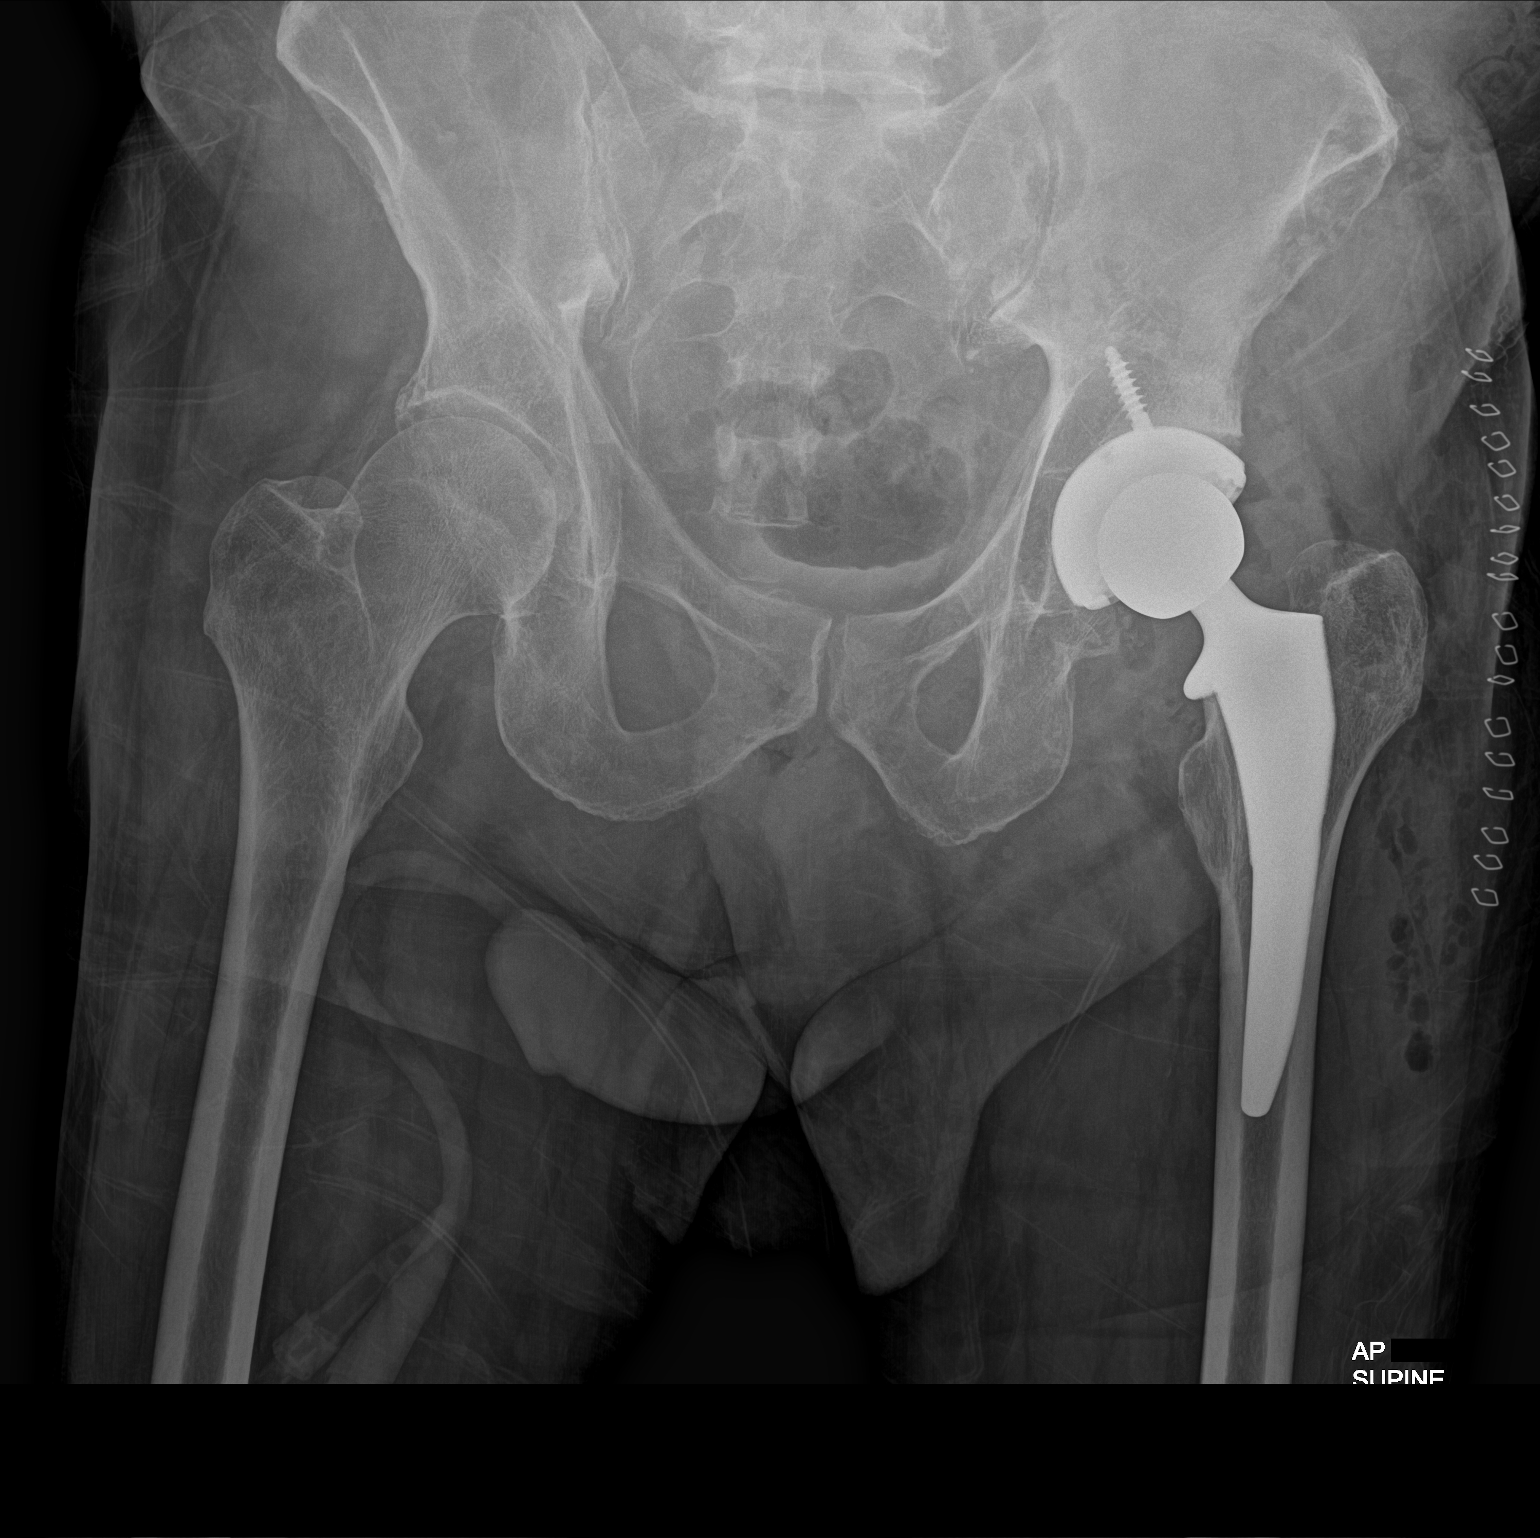

[1 of 1 positions shown; findings below may reference images not displayed]

FINDINGS: Status post left total hip replacement. No evidence for hardware
complications. Gas in the overlying soft tissues compatible with the
immediate postoperative state.
IMPRESSION: Status post left total hip replacement. No evidence for immediate
hardware complication.

## 2024-02-27 ENCOUNTER — Encounter (HOSPITAL_BASED_OUTPATIENT_CLINIC_OR_DEPARTMENT_OTHER): Admitting: *Deleted

## 2024-02-27 ENCOUNTER — Other Ambulatory Visit: Payer: Self-pay | Admitting: Internal Medicine

## 2024-02-27 ENCOUNTER — Telehealth (HOSPITAL_BASED_OUTPATIENT_CLINIC_OR_DEPARTMENT_OTHER): Payer: Self-pay | Admitting: Family Medicine

## 2024-02-27 DIAGNOSIS — I4819 Other persistent atrial fibrillation: Secondary | ICD-10-CM

## 2024-02-27 NOTE — Telephone Encounter (Signed)
 Called and spoke with Jimmy Palmer to see if I could do the AWV and Jimmy Palmer said he called earlier today to cancel today's visit as he wanted to come in person to have it done.  I cancelled today's AWV with Jimmy Palmer.  While speaking with Jimmy Palmer, he also mentioned that when he called earlier, he had said he was wanting to have a referral placed for Jimmy Palmer to either Lorayne Bender or Army Fossa who are downstairs at [facility] location.  Dr. De [place], please advise on this referral for Jimmy Palmer.

## 2024-02-28 ENCOUNTER — Other Ambulatory Visit (HOSPITAL_BASED_OUTPATIENT_CLINIC_OR_DEPARTMENT_OTHER): Payer: Self-pay | Admitting: *Deleted

## 2024-02-28 ENCOUNTER — Other Ambulatory Visit (HOSPITAL_BASED_OUTPATIENT_CLINIC_OR_DEPARTMENT_OTHER): Payer: Self-pay

## 2024-02-28 DIAGNOSIS — M549 Dorsalgia, unspecified: Secondary | ICD-10-CM

## 2024-02-28 MED ORDER — APIXABAN 5 MG PO TABS
5.0000 mg | ORAL_TABLET | Freq: Two times a day (BID) | ORAL | 1 refills | Status: DC
Start: 2024-02-28 — End: 2024-08-30
  Filled 2024-02-28 (×2): qty 180, 90d supply, fill #0
  Filled 2024-05-26: qty 180, 90d supply, fill #1
  Filled 2024-08-30: qty 6, 3d supply, fill #2

## 2024-02-28 NOTE — Telephone Encounter (Signed)
 Referral placed per provider due to patient back pain

## 2024-02-28 NOTE — Telephone Encounter (Signed)
 Prescription refill request for Eliquis received. Indication: afib  Last office visit: Graciela Husbands 08/02/2023 Scr: 1.46, 04/24/2023 Age: 83 yo  Weight: 75.9 kg   Refill sent.

## 2024-03-05 NOTE — Progress Notes (Signed)
 Remote pacemaker transmission.

## 2024-03-05 NOTE — Addendum Note (Signed)
 Addended by: Geralyn Flash D on: 03/05/2024 02:57 PM   Modules accepted: Orders

## 2024-03-07 ENCOUNTER — Encounter (HOSPITAL_BASED_OUTPATIENT_CLINIC_OR_DEPARTMENT_OTHER): Payer: Self-pay | Admitting: Physical Therapy

## 2024-03-07 ENCOUNTER — Other Ambulatory Visit: Payer: Self-pay

## 2024-03-07 ENCOUNTER — Ambulatory Visit (HOSPITAL_BASED_OUTPATIENT_CLINIC_OR_DEPARTMENT_OTHER): Attending: Family Medicine | Admitting: Physical Therapy

## 2024-03-07 DIAGNOSIS — Z96642 Presence of left artificial hip joint: Secondary | ICD-10-CM | POA: Insufficient documentation

## 2024-03-07 DIAGNOSIS — M5459 Other low back pain: Secondary | ICD-10-CM | POA: Insufficient documentation

## 2024-03-07 DIAGNOSIS — M549 Dorsalgia, unspecified: Secondary | ICD-10-CM | POA: Diagnosis not present

## 2024-03-07 DIAGNOSIS — M25551 Pain in right hip: Secondary | ICD-10-CM | POA: Diagnosis present

## 2024-03-07 DIAGNOSIS — R2689 Other abnormalities of gait and mobility: Secondary | ICD-10-CM | POA: Insufficient documentation

## 2024-03-07 DIAGNOSIS — M25552 Pain in left hip: Secondary | ICD-10-CM | POA: Diagnosis present

## 2024-03-07 DIAGNOSIS — M25652 Stiffness of left hip, not elsewhere classified: Secondary | ICD-10-CM | POA: Insufficient documentation

## 2024-03-07 NOTE — Therapy (Signed)
 OUTPATIENT PHYSICAL THERAPY THORACOLUMBAR EVALUATION   Patient Name: Jimmy Palmer MRN: [id number] DOB:10/19/1941, 83 y.o., male Today's Date: 03/07/2024  END OF SESSION:  PT End of Session - 03/07/24 1020     Visit Number 1    Number of Visits 16    Date for PT Re-Evaluation 05/02/24    PT Start Time 1018    PT Stop Time 1059    PT Time Calculation (min) 41 min    Activity Tolerance Patient tolerated treatment well;No increased pain    Behavior During Therapy [facility] for tasks assessed/performed             Past Medical History:  Diagnosis Date   Abnormal electrocardiogram    Abnormal heart sounds    Allergy 2020   upon arrival to [facility]   Anemia 10/04/2023   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   Chronic kidney disease 11/07/2021   Depression 2020   receiving appropriate therapy   Herniated lumbar intervertebral disc    Hypertension    Keratosis    benign   Left bundle branch block    Pacemaker    Pure hypercholesterolemia    Sick sinus syndrome (HCC)    Sleep apnea    VT (ventricular tachycardia) (HCC)    Past Surgical History:  Procedure Laterality Date   CARDIOVERSION N/A 09/28/2020   Procedure: CARDIOVERSION;  Surgeon: Nahser, Deloris Ping, MD;  [facility]: [facility] END[facility];  Service: Cardiovascular;  Laterality: N/A;   hip replacement Left    INSERT / REPLACE / REMOVE PACEMAKER     JOINT REPLACEMENT     partial right knee, medial side   PPM GENERATOR CHANGEOUT N/A 04/30/2023   Procedure: PPM GENERATOR CHANGEOUT;  Surgeon: Marinus Maw, MD;  [facility]: [facility] INVAS[facility] CV LAB;  Service: Cardiovascular;  Laterality: N/A;   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   Procedure: LEFT TOTAL HIP ARTHROPLASTY ANTERIOR APPROACH;  Surgeon: Kathryne Hitch, MD;  Location: [facility] ORS;  Service: Orthopedics;  Laterality: Left;   VASECTOMY     Patient Active Problem List   Diagnosis Date Noted   Depression 10/04/2023   Anemia 10/04/2023   Hyperlipidemia 10/04/2023    Abscess of right index finger 02/28/2023   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   Left hip pain 02/01/2022   Environmental and seasonal allergies 02/01/2022   Hypertension 11/07/2021   Chronic kidney disease 11/07/2021   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 Says he lifted at an awkward angle and re-aggravated the back. Says the pain in the morning has been a little painful when he gets up first thing. He has been putting heat on the back and that seems to dissipate the pain. Wants to have decreased pain and receive some exercises to manage it moving forward. He has had physical therapy in the past with benefit. Prior to hurting himself he had been going to the gym.   PERTINENT HISTORY:  Arthritis of left hip, depression, herniated lumbar IV disc, left BBB, THA left  PAIN:  Are you having pain? Yes: NPRS scale: 6/10 Pain location: lower right back Pain description: achy, naggy, sharp at times Aggravating factors: lifting, going up steps Relieving factors: heat, tylenol, back brace  PRECAUTIONS: None  RED FLAGS: None   WEIGHT BEARING RESTRICTIONS: No  FALLS:  Has patient fallen in last 6 months? No  LIVING ENVIRONMENT: Steps on back deck  OCCUPATION: Retired  PLOF:  Independent  PATIENT GOALS: get pain dialed down and get some exercises to strengthen  NEXT MD VISIT:   OBJECTIVE:  Note: Objective measures were completed at Evaluation unless otherwise noted.  DIAGNOSTIC FINDINGS:  1. No acute findings. 2. 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 3. 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 Moderate bilateral neural foraminal narrowing at the L4-5 level right worse than left. Minimal bilateral neural foraminal narrowing at L2-3 and L3-4. 4. Grade 1 anterolisthesis of L4 on L5 due to facet arthropathy.  PATIENT SURVEYS:  Oswestry: fill out next visit  COGNITION: Overall cognitive status: Within functional limits for tasks assessed     SENSATION: No lack of sensation  MUSCLE LENGTH:   POSTURE: rounded shoulders and forward head  PALPATION: TTP lateral R glute and R paraspinal L5,S1 region  LUMBAR ROM:   AROM eval  Flexion WFL  Extension WFL  Right lateral flexion WFL  Left lateral flexion Mild pain on the right side   Right rotation   Left rotation    (Blank rows = not tested)  LOWER EXTREMITY ROM:     Passive  Right eval Left eval  Hip flexion 125   Hip extension    Hip abduction    Hip adduction    Hip internal rotation Painful int he right hip    Hip external rotation    Knee flexion    Knee extension    Ankle dorsiflexion    Ankle plantarflexion    Ankle inversion    Ankle eversion     (Blank rows = not tested)  LOWER EXTREMITY MMT:    MMT Right eval Left eval  Hip flexion 32.5 34.5  Hip extension    Hip abduction 54.0 44.8  Hip adduction    Hip internal rotation    Hip external rotation    Knee flexion    Knee extension 59.8 49.5  Ankle dorsiflexion    Ankle plantarflexion    Ankle inversion    Ankle eversion     (Blank rows = not tested)  LUMBAR SPECIAL TESTS:    FUNCTIONAL  TESTS:    GAIT: Normal stride length, no decreased stance times on either leg  TREATMENT DATE:  4/11 Manual:58min All PROM performed with distraction to reduce pain and improve movement STM to R glute and bilateral paraspinals (pt reported decrease in tightness) Hip LAX    PATIENT EDUCATION:  Education details: HEP, symptom management, self care Person educated: Patient Education method: Explanation, Demonstration, Tactile cues, Verbal cues, and Handouts Education comprehension: verbalized understanding and returned demonstration  HOME EXERCISE PROGRAM:  Access Code: [email or web address] URL: [email or web address] Date: 03/07/2024 Prepared by: Marnee Spring  Exercises - Theracane In Back  - 1 x daily - 7 x weekly - 3 sets - 10 reps - Standing Glute Med Mobilization with Small Ball on Wall  - 1 x daily - 7 x weekly - 3 sets - 10 reps  ASSESSMENT:  CLINICAL IMPRESSION: Eval: Patient is a 83 y.o. male who was seen today for physical therapy evaluation and treatment for acute low back/lateral hip pain. Pt reported no numbness or tingling and main provocation of symptoms is by palpation to lateral gluteal region. Pt had normal ROM in all planes with mild pain in the hip. He has a strength deficit on the left which is where he had a previous hip replacement. . Pt education was provided on at home STM with theracane and tennis ball. Symptom reduction is main area for treatment over the next couple visits. Pt will continue to benefit from skilled physical therapy to decrease symptoms and increase tolerance for functional ADL's.   OBJECTIVE IMPAIRMENTS: decreased mobility.   ACTIVITY LIMITATIONS: lifting, bending, squatting, and stairs  PARTICIPATION LIMITATIONS: community activity and yard work  PERSONAL FACTORS: 1-2 comorbidities: DDD of the spine    are also affecting patient's functional outcome.   REHAB POTENTIAL: Good  CLINICAL DECISION MAKING: Stable/uncomplicated  EVALUATION COMPLEXITY: Low   GOALS: Goals reviewed with patient? Yes  SHORT TERM GOALS: Target date: 03/21/2024  Pt will report decreased morning symptoms within 1 hour of waking up. Baseline: Goal status: INITIAL  2.  Pt will increase knee extension dynamometer strength by 5lbs. Baseline:  Goal status: INITIAL  3.  Pt will increase hip abduction dynamometer strength by 5lbs Baseline:  Goal status: INITIAL    LONG TERM GOALS: Target date: 04/04/2024  Pt will be able to go up and down 8 stairs without increase in pain Baseline:  Goal status: INITIAL  2.  Pt will increase knee extension dynamometer strength by 10lbs  Baseline:  Goal status: INITIAL  3.  Pt will increase hip abduction dynamometer strength by 10lbs. Baseline:  Goal status: INITIAL    PLAN:  PT FREQUENCY: 1-2x/week  PT DURATION: 4 weeks  PLANNED INTERVENTIONS: 97164- PT Re-evaluation, 97110-Therapeutic exercises, 97530- Therapeutic activity, [id number]- Neuromuscular re-education, 97535- Self Care, 54098- Manual therapy, [id number]- Gait training, [phone]- Aquatic Therapy, [id number]- Electrical stimulation (unattended), and [id number]- Electrical stimulation (manual).  PLAN FOR NEXT SESSION: STM to lateral hip and lumbar paraspinals. Consider DN. Add stretches to HEP. Progress to gym activity as tolerated.    Dessie Coma, PT 03/07/2024, 1:01 PM  I have reviewed and concur with this student's documentation.   Dessie Coma, PT 03/07/2024 1:01 PM   During this treatment session, the therapist was present, participating in and directing the treatment.

## 2024-03-10 ENCOUNTER — Encounter: Payer: Self-pay | Admitting: Internal Medicine

## 2024-03-10 ENCOUNTER — Other Ambulatory Visit (HOSPITAL_BASED_OUTPATIENT_CLINIC_OR_DEPARTMENT_OTHER): Payer: Self-pay

## 2024-03-10 ENCOUNTER — Ambulatory Visit (HOSPITAL_BASED_OUTPATIENT_CLINIC_OR_DEPARTMENT_OTHER): Admitting: *Deleted

## 2024-03-10 ENCOUNTER — Encounter (HOSPITAL_BASED_OUTPATIENT_CLINIC_OR_DEPARTMENT_OTHER): Payer: Self-pay

## 2024-03-10 VITALS — BP 118/62 | HR 82 | Ht 69.0 in | Wt 169.4 lb

## 2024-03-10 DIAGNOSIS — Z1159 Encounter for screening for other viral diseases: Secondary | ICD-10-CM | POA: Diagnosis not present

## 2024-03-10 DIAGNOSIS — Z0000 Encounter for general adult medical examination without abnormal findings: Secondary | ICD-10-CM

## 2024-03-10 NOTE — Progress Notes (Signed)
 Subjective:   Jimmy Palmer is a 83 y.o. male who presents for Medicare Annual/Subsequent preventive examination.  Visit Complete: In person  Patient Medicare AWV questionnaire was completed by the patient on 03/10/24; I have confirmed that all information answered by patient is correct and no changes since this date.        Objective:    There were no vitals filed for this visit. There is no height or weight on file to calculate BMI.     03/07/2024   10:30 AM 01/16/2023    2:19 PM 05/25/2022    2:49 PM 05/05/2022   12:00 PM 04/26/2022    1:11 PM 04/06/2022    3:24 PM 09/28/2020    1:46 PM  Advanced Directives  Does Patient Have a Medical Advance Directive? Yes Yes Yes Yes Yes Yes No  Type of Estate agent of [place];Living will Healthcare Power of [place];Living will Healthcare Power of [place];Living will Healthcare Power of [place];Living will  Healthcare Power of [place];Living will   Does patient want to make changes to medical advance directive? No - Patient declined No - Patient declined No - Patient declined No - Patient declined  No - Patient declined   Copy of Healthcare Power of Attorney in Chart?  Yes - validated most recent copy scanned in chart (See row information) No - copy requested No - copy requested  No - copy requested   Would patient like information on creating a medical advance directive?       No - Patient declined    Current Medications (verified) Outpatient Encounter Medications as of 03/10/2024  Medication Sig   apixaban (ELIQUIS) 5 MG TABS tablet Take 1 tablet (5 mg total) by mouth 2 (two) times daily.   buPROPion (WELLBUTRIN SR) 150 MG 12 hr tablet Take 1 tablet (150 mg total) by mouth every morning.   diclofenac Sodium (VOLTAREN) 1 % GEL Apply 1 application topically as needed (pain).    doxazosin (CARDURA) 4 MG tablet Take 1 tablet (4 mg total) by mouth daily.   fexofenadine (ALLEGRA) 180 MG tablet Take 1 tablet (180 mg total)  by mouth daily.   losartan-hydrochlorothiazide (HYZAAR) 100-12.5 MG tablet Take 1 tablet by mouth daily.   Melatonin 5 MG TABS Take 5 mg by mouth at bedtime.    Multiple Vitamin (MULTIVITAMIN PO) Take 2 tablets by mouth once a week.   sertraline (ZOLOFT) 25 MG tablet Take 1 tablet (25 mg total) by mouth daily.   simvastatin (ZOCOR) 40 MG tablet Take 1 tablet (40 mg total) by mouth every evening.   No facility-administered encounter medications on file as of 03/10/2024.    Allergies (verified) Amoxicillin   History: Past Medical History:  Diagnosis Date   Abnormal electrocardiogram    Abnormal heart sounds    Allergy 2020   upon arrival to [facility]   Anemia 10/04/2023   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   Chronic kidney disease 11/07/2021   Depression 2020   receiving appropriate therapy   Herniated lumbar intervertebral disc    Hypertension    Keratosis    benign   Left bundle branch block    Pacemaker    Pure hypercholesterolemia    Sick sinus syndrome (HCC)    Sleep apnea    VT (ventricular tachycardia) (HCC)    Past Surgical History:  Procedure Laterality Date   CARDIOVERSION N/A 09/28/2020   Procedure: CARDIOVERSION;  Surgeon: Vesta Mixer,  MD;  [facility];  Service: Cardiovascular;  Laterality: N/A;   hip replacement Left    INSERT / REPLACE / REMOVE PACEMAKER     JOINT REPLACEMENT     partial right knee, medial side   PPM GENERATOR CHANGEOUT N/A 04/30/2023   Procedure: PPM GENERATOR CHANGEOUT;  Surgeon: Marinus Maw, MD;  [facility];  Service: Cardiovascular;  Laterality: N/A;   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   Procedure: LEFT TOTAL HIP ARTHROPLASTY ANTERIOR APPROACH;  Surgeon: Kathryne Hitch, MD;  Location: [facility];  Service: Orthopedics;  Laterality: Left;   VASECTOMY     Family History  Problem Relation Age of Onset   Arthritis Mother    Hypertension Mother    Hypertension Father    Social History    Socioeconomic History   Marital status: Married    Spouse name: Not on file   Number of children: 2   Years of education: Not on file   Highest education level: Not on file  Occupational History   Not on file  Tobacco Use   Smoking status: Former    Types: Cigars   Smokeless tobacco: Never   Tobacco comments:    Smoked cigars until the early 2000's  Vaping Use   Vaping status: Never Used  Substance and Sexual Activity   Alcohol use: Yes    Alcohol/week: 14.0 standard drinks of alcohol    Types: 7 Glasses of wine, 7 Shots of liquor per week    Comment: 1-2 cocktails every day and 1 glass of wine every day   Drug use: Never   Sexual activity: Not Currently  Other Topics Concern   Not on file  Social History Narrative   Retired Chiropractor   Recently relocated from [place]   Social Drivers of Health   Financial Resource Strain: Low Risk  (01/16/2023)   Overall Financial Resource Strain (CARDIA)    Difficulty of Paying Living Expenses: Not hard at all  Food Insecurity: No Food Insecurity (01/16/2023)   Hunger Vital Sign    Worried About Running Out of Food in the Last Year: Never true    Ran Out of Food in the Last Year: Never true  Transportation Needs: No Transportation Needs (01/16/2023)   PRAPARE - Administrator, Civil Service (Medical): No    Lack of Transportation (Non-Medical): No  Physical Activity: Sufficiently Active (01/16/2023)   Exercise Vital Sign    Days of Exercise per Week: 5 days    Minutes of Exercise per Session: 40 min  Stress: No Stress Concern Present (01/16/2023)   [employer] of Occupational Health - Occupational Stress Questionnaire    Feeling of Stress : Not at all  Social Connections: Socially Integrated (01/16/2023)   Social Connection and Isolation Panel [NHANES]    Frequency of Communication with Friends and Family: More than three times a week    Frequency of Social Gatherings with Friends and Family: More than three  times a week    Attends Religious Services: More than 4 times per year    Active Member of [employer] or Organizations: Yes    Attends Engineer, structural: More than 4 times per year    Marital Status: Married    Tobacco Counseling Counseling given: Not Answered Tobacco comments: Smoked cigars until the early 2000's   Clinical Intake:  Activities of Daily Living     No data to display           Patient Care Team: de Peru, Alonza Jansky, MD as PCP - General (Family Medicine) Verona Goodwill, MD as PCP - Cardiology (Cardiology)  Indicate any recent Medical Services you may have received from other than [facility] providers in the past year (date may be approximate).     Assessment:   This is a routine wellness examination for Teller.  Hearing/Vision screen No results found.   Goals Addressed   None   Depression Screen    10/04/2023    2:46 PM 02/28/2023    2:27 PM 01/16/2023    1:58 PM 01/16/2023    1:57 PM 02/01/2022    8:10 AM  PHQ 2/9 Scores  PHQ - 2 Score 0 0 0 0 1  PHQ- 9 Score 0      Exception Documentation  Medical reason       Fall Risk    10/04/2023    2:46 PM 02/28/2023    2:27 PM 01/16/2023    2:16 PM  Fall Risk   Falls in the past year? 0 0 0  Number falls in past yr: 0 0 0  Injury with Fall? 0 0 0  Risk for fall due to : No Fall Risks No Fall Risks No Fall Risks  Follow up Falls evaluation completed Falls evaluation completed Falls prevention discussed    MEDICARE RISK AT HOME:    TIMED UP AND GO:  Was the test performed?  Yes  Length of time to ambulate 10 feet: 15 sec Gait steady and fast without use of assistive device    Cognitive Function:        01/16/2023    2:19 PM  6CIT Screen  What Year? 0 points  What month? 0 points  What time? 0 points  Count back from 20 0 points  Months in reverse 0 points  Repeat phrase 0 points  Total Score 0 points    Immunizations Immunization History   Administered Date(s) Administered   PFIZER(Purple Top)SARS-COV-2 Vaccination 01/18/2020, 02/11/2020    TDAP status: Due, Education has been provided regarding the importance of this vaccine. Advised may receive this vaccine at local pharmacy or Health Dept. Aware to provide a copy of the vaccination record if obtained from local pharmacy or Health Dept. Verbalized acceptance and understanding.  Flu Vaccine status: Due, Education has been provided regarding the importance of this vaccine. Advised may receive this vaccine at local pharmacy or Health Dept. Aware to provide a copy of the vaccination record if obtained from local pharmacy or Health Dept. Verbalized acceptance and understanding.  Pneumococcal vaccine status: Due, Education has been provided regarding the importance of this vaccine. Advised may receive this vaccine at local pharmacy or Health Dept. Aware to provide a copy of the vaccination record if obtained from local pharmacy or Health Dept. Verbalized acceptance and understanding.  Covid-19 vaccine status: Completed vaccines  Qualifies for Shingles Vaccine? Yes   Zostavax completed Yes   Shingrix Completed?: Yes  Screening Tests Health Maintenance  Topic Date Due   DTaP/Tdap/Td (1 - Tdap) Never done   Pneumonia Vaccine 37+ Years old (1 of 2 - PCV) Never done   Zoster Vaccines- Shingrix (1 of 2) Never done   COVID-19 Vaccine (3 - 2024-25 season) 07/29/2023   INFLUENZA VACCINE  06/27/2024   Medicare Annual Wellness (AWV)  03/10/2025   HPV VACCINES  Aged Out  Meningococcal B Vaccine  Aged Out    Health Maintenance  Health Maintenance Due  Topic Date Due   DTaP/Tdap/Td (1 - Tdap) Never done   Pneumonia Vaccine 26+ Years old (1 of 2 - PCV) Never done   Zoster Vaccines- Shingrix (1 of 2) Never done   COVID-19 Vaccine (3 - 2024-25 season) 07/29/2023    Colorectal cancer screening: No longer required.   Lung Cancer Screening: (Low Dose CT Chest recommended if Age  24-80 years, 20 pack-year currently smoking OR have quit w/in 15years.) does not qualify.   Lung Cancer Screening Referral: NA  Additional Screening:  Hepatitis C Screening: does qualify; Ordered  Vision Screening: Recommended annual ophthalmology exams for early detection of glaucoma and other disorders of the eye. Is the patient up to date with their annual eye exam?  Yes  Who is the provider or what is the name of the office in which the patient attends annual eye exams? Optometrist at [facility] If pt is not established with a provider, would they like to be referred to a provider to establish care? No .   Dental Screening: Recommended annual dental exams for proper oral hygiene    Community Resource Referral / Chronic Care Management: CRR required this visit?  No   CCM required this visit?  No     Plan:     I have personally reviewed and noted the following in the patient's chart:   Medical and social history Use of alcohol, tobacco or illicit drugs  Current medications and supplements including opioid prescriptions. Patient is not currently taking opioid prescriptions. Functional ability and status Nutritional status Physical activity Advanced directives List of other physicians Hospitalizations, surgeries, and ER visits in previous 12 months Vitals Screenings to include cognitive, depression, and falls Referrals and appointments  In addition, I have reviewed and discussed with patient certain preventive protocols, quality metrics, and best practice recommendations. A written personalized care plan for preventive services as well as general preventive health recommendations were provided to patient.     Ella Gun, CMA   03/10/2024   After Visit Summary: (In Person-Printed) AVS printed and given to the patient  Nurse Notes:  Discharge Instructions   None

## 2024-03-10 NOTE — Patient Instructions (Signed)
 Jimmy Palmer , Thank you for taking time to come for your Medicare Wellness Visit. I appreciate your ongoing commitment to your health goals. Please review the following plan we discussed and let me know if I can assist you in the future.   Screening recommendations/referrals: Recommended yearly ophthalmology/optometry visit for glaucoma screening and checkup Recommended yearly dental visit for hygiene and checkup  Vaccinations: Influenza vaccine: will discuss when flu season comes around Pneumococcal vaccine: will discuss with provider  Tdap vaccine: will discuss with provider  Shingles vaccine: will discuss with provider     Advanced directives: copy in chart  Conditions/risks identified: Falls  Next appointment: 1 year   Preventive Care 65 Years and Older, Male Preventive care refers to lifestyle choices and visits with your health care provider that can promote health and wellness. What does preventive care include? A yearly physical exam. This is also called an annual well check. Dental exams once or twice a year. Routine eye exams. Ask your health care provider how often you should have your eyes checked. Personal lifestyle choices, including: Daily care of your teeth and gums. Regular physical activity. Eating a healthy diet. Avoiding tobacco and drug use. Limiting alcohol use. Practicing safe sex. Taking low doses of aspirin every day. Taking vitamin and mineral supplements as recommended by your health care provider. What happens during an annual well check? The services and screenings done by your health care provider during your annual well check will depend on your age, overall health, lifestyle risk factors, and family history of disease. Counseling  Your health care provider may ask you questions about your: Alcohol use. Tobacco use. Drug use. Emotional well-being. Home and relationship well-being. Sexual activity. Eating habits. History of falls. Memory and  ability to understand (cognition). Work and work Astronomer. Screening  You may have the following tests or measurements: Height, weight, and BMI. Blood pressure. Lipid and cholesterol levels. These may be checked every 5 years, or more frequently if you are over 81 years old. Skin check. Lung cancer screening. You may have this screening every year starting at age 10 if you have a 30-pack-year history of smoking and currently smoke or have quit within the past 15 years. Fecal occult blood test (FOBT) of the stool. You may have this test every year starting at age 19. Flexible sigmoidoscopy or colonoscopy. You may have a sigmoidoscopy every 5 years or a colonoscopy every 10 years starting at age 81. Prostate cancer screening. Recommendations will vary depending on your family history and other risks. Hepatitis C blood test. Hepatitis B blood test. Sexually transmitted disease (STD) testing. Diabetes screening. This is done by checking your blood sugar (glucose) after you have not eaten for a while (fasting). You may have this done every 1-3 years. Abdominal aortic aneurysm (AAA) screening. You may need this if you are a current or former smoker. Osteoporosis. You may be screened starting at age 81 if you are at high risk. Talk with your health care provider about your test results, treatment options, and if necessary, the need for more tests. Vaccines  Your health care provider may recommend certain vaccines, such as: Influenza vaccine. This is recommended every year. Tetanus, diphtheria, and acellular pertussis (Tdap, Td) vaccine. You may need a Td booster every 10 years. Zoster vaccine. You may need this after age 81. Pneumococcal 13-valent conjugate (PCV13) vaccine. One dose is recommended after age 81. Pneumococcal polysaccharide (PPSV23) vaccine. One dose is recommended after age 80. Talk to your health  care provider about which screenings and vaccines you need and how often you need  them. This information is not intended to replace advice given to you by your health care provider. Make sure you discuss any questions you have with your health care provider. Document Released: 12/10/2015 Document Revised: 08/02/2016 Document Reviewed: 09/14/2015 Elsevier Interactive Patient Education  2017 [employer].  Fall Prevention in the Home Falls can cause injuries. They can happen to people of all ages. There are many things you can do to make your home safe and to help prevent falls. What can I do on the outside of my home? Regularly fix the edges of walkways and driveways and fix any cracks. Remove anything that might make you trip as you walk through a door, such as a raised step or threshold. Trim any bushes or trees on the path to your home. Use bright outdoor lighting. Clear any walking paths of anything that might make someone trip, such as rocks or tools. Regularly check to see if handrails are loose or broken. Make sure that both sides of any steps have handrails. Any raised decks and porches should have guardrails on the edges. Have any leaves, snow, or ice cleared regularly. Use sand or salt on walking paths during winter. Clean up any spills in your garage right away. This includes oil or grease spills. What can I do in the bathroom? Use night lights. Install grab bars by the toilet and in the tub and shower. Do not use towel bars as grab bars. Use non-skid mats or decals in the tub or shower. If you need to sit down in the shower, use a plastic, non-slip stool. Keep the floor dry. Clean up any water that spills on the floor as soon as it happens. Remove soap buildup in the tub or shower regularly. Attach bath mats securely with double-sided non-slip rug tape. Do not have throw rugs and other things on the floor that can make you trip. What can I do in the bedroom? Use night lights. Make sure that you have a light by your bed that is easy to reach. Do not use  any sheets or blankets that are too big for your bed. They should not hang down onto the floor. Have a firm chair that has side arms. You can use this for support while you get dressed. Do not have throw rugs and other things on the floor that can make you trip. What can I do in the kitchen? Clean up any spills right away. Avoid walking on wet floors. Keep items that you use a lot in easy-to-reach places. If you need to reach something above you, use a strong step stool that has a grab bar. Keep electrical cords out of the way. Do not use floor polish or wax that makes floors slippery. If you must use wax, use non-skid floor wax. Do not have throw rugs and other things on the floor that can make you trip. What can I do with my stairs? Do not leave any items on the stairs. Make sure that there are handrails on both sides of the stairs and use them. Fix handrails that are broken or loose. Make sure that handrails are as long as the stairways. Check any carpeting to make sure that it is firmly attached to the stairs. Fix any carpet that is loose or worn. Avoid having throw rugs at the top or bottom of the stairs. If you do have throw rugs, attach them to  the floor with carpet tape. Make sure that you have a light switch at the top of the stairs and the bottom of the stairs. If you do not have them, ask someone to add them for you. What else can I do to help prevent falls? Wear shoes that: Do not have high heels. Have rubber bottoms. Are comfortable and fit you well. Are closed at the toe. Do not wear sandals. If you use a stepladder: Make sure that it is fully opened. Do not climb a closed stepladder. Make sure that both sides of the stepladder are locked into place. Ask someone to hold it for you, if possible. Clearly mark and make sure that you can see: Any grab bars or handrails. First and last steps. Where the edge of each step is. Use tools that help you move around (mobility aids)  if they are needed. These include: Canes. Walkers. Scooters. Crutches. Turn on the lights when you go into a dark area. Replace any light bulbs as soon as they burn out. Set up your furniture so you have a clear path. Avoid moving your furniture around. If any of your floors are uneven, fix them. If there are any pets around you, be aware of where they are. Review your medicines with your doctor. Some medicines can make you feel dizzy. This can increase your chance of falling. Ask your doctor what other things that you can do to help prevent falls. This information is not intended to replace advice given to you by your health care provider. Make sure you discuss any questions you have with your health care provider. Document Released: 09/09/2009 Document Revised: 04/20/2016 Document Reviewed: 12/18/2014 Elsevier Interactive Patient Education  2017 [employer].

## 2024-03-11 ENCOUNTER — Other Ambulatory Visit (HOSPITAL_BASED_OUTPATIENT_CLINIC_OR_DEPARTMENT_OTHER): Payer: Self-pay

## 2024-03-12 ENCOUNTER — Ambulatory Visit (HOSPITAL_BASED_OUTPATIENT_CLINIC_OR_DEPARTMENT_OTHER): Admitting: Physical Therapy

## 2024-03-12 ENCOUNTER — Encounter (HOSPITAL_BASED_OUTPATIENT_CLINIC_OR_DEPARTMENT_OTHER): Payer: Self-pay | Admitting: Physical Therapy

## 2024-03-12 DIAGNOSIS — M5459 Other low back pain: Secondary | ICD-10-CM

## 2024-03-12 DIAGNOSIS — M25551 Pain in right hip: Secondary | ICD-10-CM

## 2024-03-12 DIAGNOSIS — R2689 Other abnormalities of gait and mobility: Secondary | ICD-10-CM

## 2024-03-12 DIAGNOSIS — Z96642 Presence of left artificial hip joint: Secondary | ICD-10-CM

## 2024-03-12 DIAGNOSIS — M25552 Pain in left hip: Secondary | ICD-10-CM

## 2024-03-12 DIAGNOSIS — M25652 Stiffness of left hip, not elsewhere classified: Secondary | ICD-10-CM

## 2024-03-12 NOTE — Therapy (Signed)
 OUTPATIENT PHYSICAL THERAPY THORACOLUMBAR EVALUATION   Patient Name: LEVAN ALOIA MRN: [id number] DOB:12-Nov-1941, 83 y.o., male Today's Date: 03/12/2024  END OF SESSION:  PT End of Session - 03/12/24 1355     Visit Number 2    Number of Visits 16    Date for PT Re-Evaluation 05/02/24    PT Start Time 1347    PT Stop Time 1428    PT Time Calculation (min) 41 min    Activity Tolerance Patient tolerated treatment well;No increased pain    Behavior During Therapy [facility] for tasks assessed/performed              Past Medical History:  Diagnosis Date   Abnormal electrocardiogram    Abnormal heart sounds    Allergy 2020   upon arrival to [facility]   Anemia 10/04/2023   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   Chronic kidney disease 11/07/2021   Depression 2020   receiving appropriate therapy   Herniated lumbar intervertebral disc    Hypertension    Keratosis    benign   Left bundle branch block    Pacemaker    Pure hypercholesterolemia    Sick sinus syndrome (HCC)    Sleep apnea    VT (ventricular tachycardia) (HCC)    Past Surgical History:  Procedure Laterality Date   CARDIOVERSION N/A 09/28/2020   Procedure: CARDIOVERSION;  Surgeon: Nahser, Lela Purple, MD;  Location: [facility] ENDOSCOPY;  Service: Cardiovascular;  Laterality: N/A;   hip replacement Left    INSERT / REPLACE / REMOVE PACEMAKER     JOINT REPLACEMENT     partial right knee, medial side   PPM GENERATOR CHANGEOUT N/A 04/30/2023   Procedure: PPM GENERATOR CHANGEOUT;  Surgeon: Tammie Fall, MD;  Location: [facility] INVASIVE CV LAB;  Service: Cardiovascular;  Laterality: N/A;   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   Procedure: LEFT TOTAL HIP ARTHROPLASTY ANTERIOR APPROACH;  Surgeon: Arnie Lao, MD;  Location: [facility] ORS;  Service: Orthopedics;  Laterality: Left;   VASECTOMY     Patient Active Problem List   Diagnosis Date Noted   Depression 10/04/2023   Anemia 10/04/2023   Hyperlipidemia 10/04/2023    Abscess of right index finger 02/28/2023   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  ONSET DATE: about 3 weeks ago  SUBJECTIVE:  SUBJECTIVE STATEMENT: 4/16 Pt says the hip is aggravating him today. After last visit said the hip felt a lot better but he does not know what he did to aggravate it today.    Eval: Pt hurt back lifting two propane tanks. Says he lifted at an awkward angle and re-aggravated the back. Says the pain in the morning has been a little painful when he gets up first thing. He has been putting heat on the back and that seems to dissipate the pain. Wants to have decreased pain and receive some exercises to manage it moving forward. He has had physical therapy in the past with benefit. Prior to hurting himself he had been going to the gym.   PERTINENT HISTORY:  Arthritis of left hip, depression, herniated lumbar IV disc, left BBB, THA left  PAIN:  Are you having pain? Yes: NPRS scale: 6/10 Pain location: lower right back Pain description: achy, naggy, sharp at times Aggravating factors: lifting, going up steps Relieving factors: heat, tylenol, back brace  PRECAUTIONS: None  RED FLAGS: None   WEIGHT BEARING RESTRICTIONS: No  FALLS:  Has patient fallen in last 6 months?  No  LIVING ENVIRONMENT: Steps on back deck  OCCUPATION: Retired  PLOF: Independent  PATIENT GOALS: get pain dialed down and get some exercises to strengthen  NEXT MD VISIT:   OBJECTIVE:  Note: Objective measures were completed at Evaluation unless otherwise noted.  DIAGNOSTIC FINDINGS:  1. No acute findings. 2. 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 3. 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 Moderate bilateral neural foraminal narrowing at the L4-5 level right worse than left. Minimal bilateral neural foraminal narrowing at L2-3 and L3-4. 4. Grade 1 anterolisthesis of L4 on L5 due to facet arthropathy.  PATIENT SURVEYS:  Oswestry: fill out next visit  COGNITION: Overall cognitive status: Within functional limits for tasks assessed     SENSATION: No lack of sensation  MUSCLE LENGTH:   POSTURE: rounded shoulders and forward head  PALPATION: TTP lateral R glute and R paraspinal L5,S1 region  LUMBAR ROM:   AROM eval  Flexion WFL  Extension WFL  Right lateral flexion WFL  Left lateral flexion Mild pain on the right side   Right rotation   Left rotation    (Blank rows = not tested)  LOWER EXTREMITY ROM:     Passive  Right eval Left eval  Hip flexion 125   Hip extension    Hip abduction    Hip adduction    Hip internal rotation Painful int he right hip    Hip external rotation    Knee flexion    Knee extension    Ankle dorsiflexion    Ankle plantarflexion    Ankle inversion    Ankle eversion     (Blank rows = not tested)  LOWER EXTREMITY MMT:    MMT Right eval Left eval  Hip flexion 32.5 34.5  Hip extension    Hip abduction 54.0 44.8  Hip adduction    Hip internal rotation    Hip external rotation    Knee flexion    Knee extension 59.8 49.5  Ankle dorsiflexion    Ankle plantarflexion    Ankle inversion    Ankle  eversion     (Blank rows = not tested)  LUMBAR SPECIAL TESTS:    FUNCTIONAL TESTS:    GAIT: Normal stride length, no decreased stance times  on either leg  TREATMENT DATE:                                                                                                                               4/16 Manual: All PROM performed with distraction to reduce pain and improve movement STM glute lumbar spine Trigger Point Dry Needling  Subsequent Treatment: Instructions provided previously at initial dry needling treatment.  Instructions reviewed, if requested by the patient, prior to subsequent dry needling treatment.   Patient Verbal Consent Given: Yes Education Handout Provided: Previously Provided Muscles Treated:right gluteal 3x using a .75x30 needle  Electrical Stimulation Performed: No Treatment Response/Outcome: strong muscle twitch  Manual: skilled palpation of trigger points; trigger point release to lumbar spine and right gluteal; review of self trigger poitnt release at home.   Manual: There-ex: Nu-step LTR 2x10  Yoga ball roll outs 5x 5 sec hold   Neuro-Re-ed  Banded ER        4/11 Manual:98min All PROM performed with distraction to reduce pain and improve movement STM to R glute and bilateral paraspinals (pt reported decrease in tightness) Hip LAX    PATIENT EDUCATION:  Education details: HEP, symptom management, self care Person educated: Patient Education method: Explanation, Demonstration, Tactile cues, Verbal cues, and Handouts Education comprehension: verbalized understanding and returned demonstration  HOME EXERCISE PROGRAM:  Access Code: [email or web address] URL: [email or web address] Date: 03/07/2024 Prepared by: Lonzell Robin  Exercises - Theracane In Back  - 1 x daily - 7 x weekly - 3 sets - 10 reps - Standing Glute Med Mobilization with Small Ball on Wall  - 1 x daily - 7 x weekly - 3 sets - 10  reps  ASSESSMENT:  CLINICAL IMPRESSION: 4/16 Pt warmed up on the nu-step for with no increase in symptoms. TPDN was performed noting tightness in R glute region. STM and trigger point therapy were performed after with pt reporting reduction in symptoms. Because of irritability, rest of session focused on stretching and patient education on symptom management. Pt will continue to benefit from skilled physical therapy to progress soft tissue restrictions for functional ADL's.    Eval: Patient is a 83 y.o. male who was seen today for physical therapy evaluation and treatment for acute low back/lateral hip pain. Pt reported no numbness or tingling and main provocation of symptoms is by palpation to lateral gluteal region. Pt had normal ROM in all planes with mild pain in the hip. He has a strength deficit on the left which is where he had a previous hip replacement. . Pt education was provided on at home STM with theracane and tennis ball. Symptom reduction is main area for treatment over the next couple visits. Pt will continue to benefit from skilled physical therapy to decrease symptoms and increase tolerance for functional ADL's.   OBJECTIVE IMPAIRMENTS: decreased mobility.   ACTIVITY LIMITATIONS: lifting, bending, squatting, and stairs  PARTICIPATION LIMITATIONS:  community activity and yard work  PERSONAL FACTORS: 1-2 comorbidities: DDD of the spine   are also affecting patient's functional outcome.   REHAB POTENTIAL: Good  CLINICAL DECISION MAKING: Stable/uncomplicated  EVALUATION COMPLEXITY: Low   GOALS: Goals reviewed with patient? Yes  SHORT TERM GOALS: Target date: 03/21/2024  Pt will report decreased morning symptoms within 1 hour of waking up. Baseline: Goal status: INITIAL  2.  Pt will increase knee extension dynamometer strength by 5lbs. Baseline:  Goal status: INITIAL  3.  Pt will increase hip abduction dynamometer strength by 5lbs Baseline:  Goal status:  INITIAL    LONG TERM GOALS: Target date: 04/04/2024  Pt will be able to go up and down 8 stairs without increase in pain Baseline:  Goal status: INITIAL  2.  Pt will increase knee extension dynamometer strength by 10lbs  Baseline:  Goal status: INITIAL  3.  Pt will increase hip abduction dynamometer strength by 10lbs. Baseline:  Goal status: INITIAL    PLAN:  PT FREQUENCY: 1-2x/week  PT DURATION: 4 weeks  PLANNED INTERVENTIONS: 97164- PT Re-evaluation, 97110-Therapeutic exercises, 97530- Therapeutic activity, [id number]- Neuromuscular re-education, 97535- Self Care, [id number]- Manual therapy, [id number]- Gait training, [phone]- Aquatic Therapy, [phone]- Electrical stimulation (unattended), and [id number]- Electrical stimulation (manual).  PLAN FOR NEXT SESSION: STM to lateral hip and lumbar paraspinals. Consider DN. Add stretches to HEP. Progress to gym activity as tolerated.    Katheran Palms, Student-PT 03/12/2024, 2:36 PM  I have reviewed and concur with this student's documentation.    During this treatment session, the therapist was present, participating in and directing the treatment.

## 2024-03-13 ENCOUNTER — Other Ambulatory Visit: Payer: Self-pay | Admitting: Physical Medicine and Rehabilitation

## 2024-03-13 ENCOUNTER — Telehealth: Payer: Self-pay | Admitting: Physical Medicine and Rehabilitation

## 2024-03-13 ENCOUNTER — Ambulatory Visit (HOSPITAL_BASED_OUTPATIENT_CLINIC_OR_DEPARTMENT_OTHER): Admitting: Physical Therapy

## 2024-03-13 DIAGNOSIS — M25552 Pain in left hip: Secondary | ICD-10-CM

## 2024-03-13 DIAGNOSIS — M5459 Other low back pain: Secondary | ICD-10-CM

## 2024-03-13 DIAGNOSIS — R2689 Other abnormalities of gait and mobility: Secondary | ICD-10-CM

## 2024-03-13 DIAGNOSIS — M25551 Pain in right hip: Secondary | ICD-10-CM

## 2024-03-13 DIAGNOSIS — M48062 Spinal stenosis, lumbar region with neurogenic claudication: Secondary | ICD-10-CM

## 2024-03-13 DIAGNOSIS — M5416 Radiculopathy, lumbar region: Secondary | ICD-10-CM

## 2024-03-13 NOTE — Therapy (Signed)
 OUTPATIENT PHYSICAL THERAPY THORACOLUMBAR EVALUATION   Patient Name: Jimmy Palmer MRN: [id number] DOB:1941-04-29, 83 y.o., male Today's Date: 03/15/2024  END OF SESSION:  PT End of Session - 03/15/24 1007     Visit Number 3    Number of Visits 16    Date for PT Re-Evaluation 05/02/24    PT Start Time 1315    PT Stop Time 1356    PT Time Calculation (min) 41 min    Activity Tolerance Patient tolerated treatment well;No increased pain    Behavior During Therapy [facility] for tasks assessed/performed              Past Medical History:  Diagnosis Date   Abnormal electrocardiogram    Abnormal heart sounds    Allergy 2020   upon arrival to [facility]   Anemia 10/04/2023   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   Chronic kidney disease 11/07/2021   Depression 2020   receiving appropriate therapy   Herniated lumbar intervertebral disc    Hypertension    Keratosis    benign   Left bundle branch block    Pacemaker    Pure hypercholesterolemia    Sick sinus syndrome (HCC)    Sleep apnea    VT (ventricular tachycardia) (HCC)    Past Surgical History:  Procedure Laterality Date   CARDIOVERSION N/A 09/28/2020   Procedure: CARDIOVERSION;  Surgeon: Nahser, Lela Purple, MD;  Location: [facility] ENDOSCOPY;  Service: Cardiovascular;  Laterality: N/A;   hip replacement Left    INSERT / REPLACE / REMOVE PACEMAKER     JOINT REPLACEMENT     partial right knee, medial side   PPM GENERATOR CHANGEOUT N/A 04/30/2023   Procedure: PPM GENERATOR CHANGEOUT;  Surgeon: Tammie Fall, MD;  Location: [facility] INVASIVE CV LAB;  Service: Cardiovascular;  Laterality: N/A;   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   Procedure: LEFT TOTAL HIP ARTHROPLASTY ANTERIOR APPROACH;  Surgeon: Arnie Lao, MD;  Location: [facility] ORS;  Service: Orthopedics;  Laterality: Left;   VASECTOMY     Patient Active Problem List   Diagnosis Date Noted   Depression 10/04/2023   Anemia 10/04/2023   Hyperlipidemia 10/04/2023    Abscess of right index finger 02/28/2023   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   Left hip pain 02/01/2022   Environmental and seasonal allergies 02/01/2022   Hypertension 11/07/2021   Chronic kidney disease 11/07/2021   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 After last visit said the hip felt a lot better but he does not know what he did to aggravate it today.    Eval: Pt hurt back lifting two propane tanks. Says he lifted at an awkward angle and re-aggravated the back. Says the pain in the morning has been a little painful when he gets up first thing. He has been putting heat on the back and that seems to dissipate the pain. Wants to have decreased pain and receive some exercises to manage it moving forward. He has had physical therapy in the past with benefit. Prior to hurting himself he had been going to the gym.   PERTINENT HISTORY:  Arthritis of left hip, depression, herniated lumbar IV disc, left BBB, THA left  PAIN:  Are you having pain? Yes: NPRS scale: 6/10 Pain location: lower right back Pain description: achy, naggy, sharp at times Aggravating factors: lifting, going up steps Relieving factors: heat, tylenol , back brace  PRECAUTIONS: None  RED  FLAGS: None   WEIGHT BEARING RESTRICTIONS: No  FALLS:  Has patient fallen in last 6 months? No  LIVING ENVIRONMENT: Steps on back deck  OCCUPATION: Retired  PLOF: Independent  PATIENT GOALS: get pain dialed down and get some exercises to strengthen  NEXT MD VISIT:   OBJECTIVE:  Note: Objective measures were completed at Evaluation unless otherwise noted.  DIAGNOSTIC FINDINGS:  1. No acute findings. 2. 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 3. 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 Moderate bilateral neural foraminal narrowing at the L4-5 level right worse than left. Minimal bilateral neural foraminal narrowing at L2-3 and L3-4. 4. Grade 1 anterolisthesis of L4 on L5 due to facet arthropathy.  PATIENT SURVEYS:  Oswestry: fill out next visit  COGNITION: Overall cognitive status: Within functional limits for tasks assessed     SENSATION: No lack of sensation  MUSCLE LENGTH:   POSTURE: rounded shoulders and forward head  PALPATION: TTP lateral R glute and R paraspinal L5,S1 region  LUMBAR ROM:   AROM eval  Flexion WFL  Extension WFL  Right lateral flexion WFL  Left lateral flexion Mild pain on the right side   Right rotation   Left rotation    (Blank rows = not tested)  LOWER EXTREMITY ROM:     Passive  Right eval Left eval  Hip flexion 125   Hip extension    Hip abduction    Hip adduction    Hip internal rotation Painful int he right hip    Hip external rotation    Knee flexion    Knee extension    Ankle dorsiflexion    Ankle plantarflexion    Ankle inversion    Ankle eversion     (Blank rows = not tested)  LOWER EXTREMITY MMT:    MMT Right eval Left eval  Hip flexion 32.5 34.5  Hip extension    Hip abduction 54.0 44.8  Hip adduction    Hip internal rotation    Hip external rotation    Knee flexion    Knee  extension 59.8 49.5  Ankle dorsiflexion    Ankle plantarflexion    Ankle inversion    Ankle eversion     (Blank rows = not tested)  LUMBAR SPECIAL TESTS:    FUNCTIONAL TESTS:    GAIT: Normal stride length, no decreased stance times  on either leg  TREATMENT DATE:                                                                                                                                 4/16 Manual: Trigger Point Dry Needling  Subsequent Treatment: Instructions provided previously at initial dry needling treatment.  Instructions reviewed, if requested by the patient, prior to subsequent dry needling treatment.   Patient Verbal Consent Given: Yes Education Handout Provided: Previously Provided Muscles Treated:right gluteal 3x using a .75x30 needle  Electrical Stimulation Performed: No Treatment Response/Outcome: strong muscle twitch  Manual: skilled palpation of trigger points; trigger point release to lumbar spine and right gluteal; review of self trigger poitnt release at home.   Manual: There-ex: Nu-step LTR 2x10  Yoga ball roll outs 5x 5 sec hold   Neuro-Re-ed  Banded ER yellow 3x10 with cuing for posture and breathing              Bilateral hip abduction seated yellow band with cuing for breathing and posture 3x10 RPE of 3 advised he can move to the red band          4/11 Manual:28min All PROM performed with distraction to reduce pain and improve movement STM to R glute and bilateral paraspinals (pt reported decrease in tightness) Hip LAX    PATIENT EDUCATION:  Education details: HEP, symptom management, self care Person educated: Patient Education method: Explanation, Demonstration, Tactile cues, Verbal cues, and Handouts Education comprehension: verbalized understanding and returned demonstration  HOME EXERCISE PROGRAM:  Access Code: [email or web address] URL: [email or web address] Date: 03/07/2024 Prepared by:  Lonzell Robin  Exercises - Theracane In Back  - 1 x daily - 7 x weekly - 3 sets - 10 reps - Standing Glute Med Mobilization with Small Ball on Wall  - 1 x daily - 7 x weekly - 3 sets - 10 reps  ASSESSMENT:  CLINICAL IMPRESSION: 4/16 Pt warmed up on the nu-step for with no increase in symptoms. TPDN was performed noting tightness in R glute region. STM and trigger point therapy were performed after with pt reporting reduction in symptoms. Because of irritability, rest of session focused on stretching and patient education on symptom management. Pt will continue to benefit from skilled physical therapy to progress soft tissue restrictions for functional ADL's.    Eval: Patient is a 83 y.o. male who was seen today for physical therapy evaluation and treatment for acute low back/lateral hip pain. Pt reported no numbness or tingling and main provocation of symptoms is by palpation to lateral gluteal region. Pt had normal ROM in all planes with mild pain in the hip. He has a strength deficit on the left which is where he had a previous hip replacement. . Pt education was provided on at home STM with theracane and tennis ball. Symptom reduction is main area for treatment over the next couple visits. Pt will  continue to benefit from skilled physical therapy to decrease symptoms and increase tolerance for functional ADL's.   OBJECTIVE IMPAIRMENTS: decreased mobility.   ACTIVITY LIMITATIONS: lifting, bending, squatting, and stairs  PARTICIPATION LIMITATIONS: community activity and yard work  PERSONAL FACTORS: 1-2 comorbidities: DDD of the spine   are also affecting patient's functional outcome.   REHAB POTENTIAL: Good  CLINICAL DECISION MAKING: Stable/uncomplicated  EVALUATION COMPLEXITY: Low   GOALS: Goals reviewed with patient? Yes  SHORT TERM GOALS: Target date: 03/21/2024  Pt will report decreased morning symptoms within 1 hour of waking up. Baseline: Goal status: INITIAL  2.  Pt  will increase knee extension dynamometer strength by 5lbs. Baseline:  Goal status: INITIAL  3.  Pt will increase hip abduction dynamometer strength by 5lbs Baseline:  Goal status: INITIAL    LONG TERM GOALS: Target date: 04/04/2024  Pt will be able to go up and down 8 stairs without increase in pain Baseline:  Goal status: INITIAL  2.  Pt will increase knee extension dynamometer strength by 10lbs  Baseline:  Goal status: INITIAL  3.  Pt will increase hip abduction dynamometer strength by 10lbs. Baseline:  Goal status: INITIAL    PLAN:  PT FREQUENCY: 1-2x/week  PT DURATION: 4 weeks  PLANNED INTERVENTIONS: 97164- PT Re-evaluation, 97110-Therapeutic exercises, 97530- Therapeutic activity, [id number]- Neuromuscular re-education, 97535- Self Care, [id number]- Manual therapy, [id number]- Gait training, [id number]- Aquatic Therapy, [phone]- Electrical stimulation (unattended), and [id number]- Electrical stimulation (manual).  PLAN FOR NEXT SESSION: STM to lateral hip and lumbar paraspinals. Consider DN. Add stretches to HEP. Progress to gym activity as tolerated.    Kitty Perkins, PT 03/15/2024, 10:07 AM  I have reviewed and concur with this student's documentation.    During this treatment session, the therapist was present, participating in and directing the treatment.   OUTPATIENT PHYSICAL THERAPY THORACOLUMBAR EVALUATION   Patient Name: Jimmy Palmer MRN: [id number] DOB:[birth year], 82 y.o., male Today's Date: 03/15/2024  END OF SESSION:  PT End of Session - 03/15/24 1007     Visit Number 3    Number of Visits 16    Date for PT Re-Evaluation 05/02/24    PT Start Time 1315    PT Stop Time 1356    PT Time Calculation (min) 41 min    Activity Tolerance Patient tolerated treatment well;No increased pain    Behavior During Therapy [facility] for tasks assessed/performed               Past Medical History:  Diagnosis Date   Abnormal electrocardiogram    Abnormal heart sounds    Allergy  2020   upon arrival to [facility]   Anemia 10/04/2023   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   Chronic kidney disease 11/07/2021   Depression 2020   receiving appropriate therapy   Herniated lumbar intervertebral disc    Hypertension    Keratosis    benign   Left bundle branch block    Pacemaker    Pure hypercholesterolemia    Sick sinus syndrome (HCC)    Sleep apnea    VT (ventricular tachycardia) (HCC)    Past Surgical History:  Procedure Laterality Date   CARDIOVERSION N/A 09/28/2020   Procedure: CARDIOVERSION;  Surgeon: Nahser, Lela Purple, MD;  Location: [facility] ENDOS[facility];  Service: Cardiovascular;  Laterality: N/A;   hip replacement Left    INSERT / REPLACE / REMOVE PACEMAKER     JOINT REPLACEMENT     partial right knee,  medial side   PPM GENERATOR CHANGEOUT N/A 04/30/2023   Procedure: PPM GENERATOR CHANGEOUT;  Surgeon: Tammie Fall, MD;  Location: [facility] INVASIVE CV LAB;  Service: Cardiovascular;  Laterality: N/A;   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   Procedure: LEFT TOTAL HIP ARTHROPLASTY ANTERIOR APPROACH;  Surgeon: Arnie Lao, MD;  Location: [facility];  Service: Orthopedics;  Laterality: Left;   VASECTOMY     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   Atrial tachycardia (HCC) 08/26/2020   Pacemaker - MDT 08/26/2020    LKG:[id number], Milon Aloe, MD    REFERRING PROVIDER: de [place], [name], MD  REFERRING DIAG: Unspecified low back pain  Rationale for Evaluation and Treatment: Rehabilitation  THERAPY DIAG:  Other low back pain  Pain in right hip  Other  abnormalities of gait and mobility  Pain in left hip  ONSET DATE: about 3 weeks ago  SUBJECTIVE:                                                                                                                                                                                           SUBJECTIVE STATEMENT: The patient reports he was sore last night following the needling. He went to a restaurant for a few and had some sharp shooting pains. It is better today.   Eval: Pt hurt back lifting two propane tanks. Says he lifted at an awkward angle and re-aggravated the back. Says the pain in the morning has been a little painful when he gets up first thing. He has been putting heat on the back and that seems to dissipate the pain. Wants to have decreased pain and receive some exercises to manage it moving forward. He has had physical therapy in the past with benefit. Prior to hurting himself he had been going to the gym.   PERTINENT HISTORY:  Arthritis of left hip, depression, herniated lumbar IV disc, left BBB, THA left  PAIN:  Are you having pain? Yes: NPRS scale: 6/10 Pain location: lower right back Pain description: achy, naggy, sharp at times Aggravating factors: lifting, going up steps Relieving factors: heat, tylenol , back brace  PRECAUTIONS: None  RED FLAGS: None   WEIGHT BEARING RESTRICTIONS: No  FALLS:  Has patient fallen in last 6 months? No  LIVING ENVIRONMENT: Steps on back deck  OCCUPATION: Retired  PLOF: Independent  PATIENT GOALS: get pain dialed down and get some exercises to strengthen  NEXT MD VISIT:   OBJECTIVE:  Note: Objective measures were completed at Evaluation unless otherwise noted.  DIAGNOSTIC FINDINGS:  1. No acute findings. 2. 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 3. 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 Moderate  bilateral neural foraminal narrowing at the L4-5 level right worse than left. Minimal bilateral neural foraminal narrowing at L2-3 and L3-4. 4. Grade 1 anterolisthesis of L4 on L5 due to facet arthropathy.  PATIENT SURVEYS:  Oswestry: fill out next visit  COGNITION: Overall cognitive status: Within functional limits for tasks assessed     SENSATION: No lack of sensation  MUSCLE LENGTH:   POSTURE: rounded shoulders and forward head  PALPATION: TTP lateral R glute and R paraspinal L5,S1 region  LUMBAR ROM:   AROM eval  Flexion WFL  Extension WFL  Right lateral flexion WFL  Left lateral flexion Mild pain on the right side   Right rotation   Left rotation    (Blank rows = not tested)  LOWER EXTREMITY ROM:     Passive  Right eval Left eval  Hip flexion 125   Hip extension    Hip abduction    Hip adduction    Hip internal rotation Painful int he right hip    Hip external rotation    Knee flexion    Knee extension    Ankle dorsiflexion    Ankle plantarflexion    Ankle inversion    Ankle eversion     (Blank rows = not tested)  LOWER EXTREMITY MMT:    MMT Right eval Left eval  Hip flexion 32.5 34.5  Hip extension    Hip abduction 54.0 44.8  Hip adduction    Hip internal rotation    Hip external rotation    Knee flexion    Knee extension 59.8 49.5  Ankle dorsiflexion    Ankle plantarflexion    Ankle inversion    Ankle eversion     (Blank rows = not tested)  LUMBAR SPECIAL TESTS:    FUNCTIONAL TESTS:    GAIT: Normal stride length, no decreased stance times on either leg  TREATMENT DATE:                                                                                                                               4/17 Manual: skilled palpation of trigger points; trigger point release to lumbar spine and right gluteal; review of self trigger poitnt release at home. Lumbar PA mobilization L5-L3   Neuro reEd [employer] will 3 x 10 20 pounds RPE  3 Tricep press down life fitness 3 x 10 20 pounds RPE 3  Low back pain noted.  Worked on abdominal breathing and posture  4/16 Manual: Trigger Point Dry Needling  Subsequent Treatment: Instructions provided previously at initial dry needling treatment.  Instructions reviewed, if requested by the patient, prior to subsequent dry needling treatment.   Patient Verbal Consent Given: Yes Education Handout Provided: Previously Provided Muscles Treated:right gluteal 3x using a .75x30 needle  Electrical Stimulation Performed: No Treatment Response/Outcome: strong muscle twitch  Manual: skilled palpation of trigger points; trigger point release to lumbar spine and right gluteal; review of self trigger poitnt release at home.   Manual: There-ex: Nu-step LTR 2x10  Yoga ball roll outs 5x 5 sec hold   Neuro-Re-ed  Banded ER yellow 3x10 with cuing for posture and breathing              Bilateral hip abduction seated yellow band with cuing for breathing and posture 3x10 RPE of 3 advised he can move to the red band          4/11 Manual:54min All PROM performed with distraction to reduce pain and improve movement STM to R glute and bilateral paraspinals (pt reported decrease in tightness) Hip LAX    PATIENT EDUCATION:  Education details: HEP, symptom management, self care Person educated: Patient Education method: Explanation, Demonstration, Tactile cues, Verbal cues, and Handouts Education comprehension: verbalized understanding and returned demonstration  HOME EXERCISE PROGRAM:  Access Code: [email or web address] URL: [email or web address] Date: 03/07/2024 Prepared by: Lonzell Robin  Exercises - Theracane In Back  - 1 x daily - 7 x weekly - 3 sets - 10 reps - Standing Glute Med Mobilization with Small Ball on Wall  - 1 x daily - 7 x weekly - 3 sets - 10 reps  ASSESSMENT:  CLINICAL IMPRESSION: Patient continues to have tenderness to  palpation in his gluteal.  He reports the soreness from the needling had dissipated.  He reported improved pain following manual therapy today.  Therapy began reintegrating his gym program.  He had no significant increase in pain.  Over the next few visits and his pain continues to improve we will continue to add more exercises.  Will consider taking a look at it she next visit and looking at his weights and adjusting accordingly.  Will continue with needling and soft tissue mobilization as tolerated.  Eval: Patient is a 83 y.o. male who was seen today for physical therapy evaluation and treatment for acute low back/lateral hip pain. Pt reported no numbness or tingling and main provocation of symptoms is by palpation to lateral gluteal region. Pt had normal ROM in all planes with mild pain in the hip. He has a strength deficit on the left which is where he had a previous hip replacement. . Pt education was provided on at home STM with theracane and tennis ball. Symptom reduction is main area for treatment over the next couple visits. Pt will continue to benefit from skilled physical therapy to decrease symptoms and increase tolerance for functional ADL's.   OBJECTIVE IMPAIRMENTS: decreased mobility.   ACTIVITY LIMITATIONS: lifting, bending, squatting, and stairs  PARTICIPATION LIMITATIONS: community activity and yard work  PERSONAL FACTORS: 1-2 comorbidities: DDD of the spine   are also affecting patient's functional outcome.   REHAB POTENTIAL: Good  CLINICAL DECISION MAKING: Stable/uncomplicated  EVALUATION COMPLEXITY: Low   GOALS: Goals reviewed with patient? Yes  SHORT TERM GOALS: Target date: 03/21/2024  Pt will report decreased morning symptoms within 1 hour of waking up. Baseline: Goal status: INITIAL  2.  Pt will increase knee extension dynamometer strength by 5lbs. Baseline:  Goal status: INITIAL  3.  Pt will increase hip abduction dynamometer strength by 5lbs Baseline:  Goal  status: INITIAL    LONG TERM GOALS: Target date: 04/04/2024  Pt will be able to go up and down 8 stairs without increase in pain Baseline:  Goal status: INITIAL  2.  Pt will increase knee extension dynamometer strength by 10lbs  Baseline:  Goal status: INITIAL  3.  Pt will increase hip abduction dynamometer strength by 10lbs. Baseline:  Goal status: INITIAL    PLAN:  PT FREQUENCY: 1-2x/week  PT DURATION: 4 weeks  PLANNED INTERVENTIONS: 97164- PT Re-evaluation, 97110-Therapeutic exercises, 97530- Therapeutic activity, [id number]- Neuromuscular re-education, 97535- Self Care, 16109- Manual therapy, [id number]- Gait training, [phone]- Aquatic Therapy, [phone]- Electrical stimulation (unattended), and [id number]- Electrical stimulation (manual).  PLAN FOR NEXT SESSION: STM to lateral hip and lumbar paraspinals. Consider DN. Add stretches to HEP. Progress to gym activity as tolerated.    Kitty Perkins, PT 03/15/2024, 10:07 AM  I have reviewed and concur with this student's documentation.    During this treatment session, the therapist was present, participating in and directing the treatment.

## 2024-03-13 NOTE — Telephone Encounter (Signed)
 Pt called requesting a call to set an appt for back injection. Please call pt asap about this matter at [phone]. Pt last injection 06/2023

## 2024-03-15 ENCOUNTER — Encounter (HOSPITAL_BASED_OUTPATIENT_CLINIC_OR_DEPARTMENT_OTHER): Payer: Self-pay | Admitting: Physical Therapy

## 2024-03-18 ENCOUNTER — Encounter (HOSPITAL_BASED_OUTPATIENT_CLINIC_OR_DEPARTMENT_OTHER): Payer: Self-pay | Admitting: Physical Therapy

## 2024-03-18 ENCOUNTER — Ambulatory Visit (HOSPITAL_BASED_OUTPATIENT_CLINIC_OR_DEPARTMENT_OTHER): Admitting: Physical Therapy

## 2024-03-18 DIAGNOSIS — M5459 Other low back pain: Secondary | ICD-10-CM | POA: Diagnosis not present

## 2024-03-18 DIAGNOSIS — M25551 Pain in right hip: Secondary | ICD-10-CM

## 2024-03-18 DIAGNOSIS — R2689 Other abnormalities of gait and mobility: Secondary | ICD-10-CM

## 2024-03-18 NOTE — Therapy (Signed)
 OUTPATIENT PHYSICAL THERAPY THORACOLUMBAR EVALUATION   Patient Name: Jimmy Palmer MRN: [id number] DOB:[birth year], 83 y.o., male Today's Date: 03/19/2024  END OF SESSION:  PT End of Session - 03/18/24 1206     Visit Number 4    Number of Visits 16    Date for PT Re-Evaluation 05/02/24    Authorization Type MCR A & B    PT Start Time 1153    PT Stop Time 1234    PT Time Calculation (min) 41 min    Activity Tolerance Patient tolerated treatment well;No increased pain    Behavior During Therapy [facility] for tasks assessed/performed                Past Medical History:  Diagnosis Date   Abnormal electrocardiogram    Abnormal heart sounds    Allergy 2020   upon arrival to [facility]   Anemia 10/04/2023   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   Chronic kidney disease 11/07/2021   Depression 2020   receiving appropriate therapy   Herniated lumbar intervertebral disc    Hypertension    Keratosis    benign   Left bundle branch block    Pacemaker    Pure hypercholesterolemia    Sick sinus syndrome (HCC)    Sleep apnea    VT (ventricular tachycardia) (HCC)    Past Surgical History:  Procedure Laterality Date   CARDIOVERSION N/A 09/28/2020   Procedure: CARDIOVERSION;  Surgeon: Nahser, Lela Purple, MD;  Location: [facility];  Service: Cardiovascular;  Laterality: N/A;   hip replacement Left    INSERT / REPLACE / REMOVE PACEMAKER     JOINT REPLACEMENT     partial right knee, medial side   PPM GENERATOR CHANGEOUT N/A 04/30/2023   Procedure: PPM GENERATOR CHANGEOUT;  Surgeon: Tammie Fall, MD;  Location: [facility];  Service: Cardiovascular;  Laterality: N/A;   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   Procedure: LEFT TOTAL HIP ARTHROPLASTY ANTERIOR APPROACH;  Surgeon: Arnie Lao, MD;  Location: [facility];  Service: Orthopedics;  Laterality: Left;   VASECTOMY     Patient Active Problem List   Diagnosis Date Noted   Depression 10/04/2023   Anemia  10/04/2023   Hyperlipidemia 10/04/2023   Abscess of right index finger 02/28/2023   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   Left hip pain 02/01/2022   Environmental and seasonal allergies 02/01/2022   Hypertension 11/07/2021   Chronic kidney disease 11/07/2021   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  Pt felt better after prior Rx with the STM.  Pt doesn't have a theracane, but plans to order it.  Pt hasn't used the ball on the wall at home yet.  Pt plans to see Dr. Daisey Dryer.            PERTINENT HISTORY:  Arthritis of left hip, depression, herniated lumbar IV disc, left BBB, THA left 2023, R partial knee replacement approx 2021 PACEMAKER--NO E-STIM  PAIN:  Are you having pain? Yes: NPRS scale: 3-4/10 Pain location: lower right back Pain description: achy, naggy, sharp at times Aggravating factors: lifting, going up steps Relieving factors: heat, tylenol , back brace  PRECAUTIONS: None  RED FLAGS: None   WEIGHT BEARING RESTRICTIONS: No  FALLS:  Has patient fallen in last 6 months? No  LIVING ENVIRONMENT: Steps on back deck  OCCUPATION: Retired  PLOF: Independent  PATIENT GOALS: get pain dialed down and get some exercises to strengthen  NEXT MD  VISIT:   OBJECTIVE:  Note: Objective measures were completed at Evaluation unless otherwise noted.  DIAGNOSTIC FINDINGS:  1. No acute findings. 2. 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 3. 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 Moderate bilateral neural foraminal narrowing at the L4-5 level right worse than left. Minimal bilateral neural foraminal narrowing at L2-3 and L3-4. 4. Grade 1 anterolisthesis of L4 on L5 due to facet arthropathy.  PATIENT SURVEYS:  Oswestry: fill out next visit  COGNITION: Overall cognitive status: Within functional limits for tasks assessed     SENSATION: No lack of sensation  MUSCLE LENGTH:   POSTURE: rounded shoulders and forward head  PALPATION: TTP lateral R glute and R paraspinal L5,S1 region  LUMBAR ROM:   AROM eval  Flexion WFL  Extension WFL  Right lateral flexion WFL  Left lateral flexion Mild pain on the right side   Right rotation   Left rotation    (Blank rows = not tested)  LOWER EXTREMITY ROM:     Passive  Right eval Left eval  Hip flexion 125   Hip extension    Hip abduction    Hip adduction    Hip internal rotation Painful int he right hip    Hip external rotation    Knee flexion    Knee extension    Ankle dorsiflexion    Ankle plantarflexion    Ankle inversion    Ankle eversion     (Blank rows = not tested)  LOWER EXTREMITY MMT:    MMT Right eval Left eval  Hip flexion 32.5 34.5  Hip extension    Hip abduction 54.0 44.8  Hip adduction    Hip internal rotation    Hip external rotation    Knee flexion    Knee extension 59.8 49.5  Ankle dorsiflexion    Ankle plantarflexion    Ankle inversion    Ankle eversion     (Blank rows = not tested)  LUMBAR SPECIAL TESTS:    FUNCTIONAL TESTS:    GAIT: Normal stride length, no decreased stance times on either leg  TREATMENT DATE:  4/22  Therapeutic Exercise: Reviewed PMHx, pain level, and response to prior Rx.  Nustep L4 UE/Les x 5 mins PPT 2x10 Supine bridging 2x10 Bilateral hip abduction seated RTB 2x10  Manual Therapy:  STM to R glute in L S/L'ing with pillow b/w knees and to R glute and bilateral paraspinals in prone.   4/17 Manual: skilled palpation of trigger points; trigger point release to lumbar spine and right gluteal; review of self trigger poitnt release at home. Lumbar PA mobilization L5-L3   Neuro reEd Life fitness will 3 x 10 20 pounds RPE 3 Tricep press down life fitness 3 x 10 20 pounds RPE 3  Low back pain noted.  Worked on abdominal breathing and posture  12:40 minus  4/16 Manual: Trigger Point Dry Needling  Subsequent Treatment: Instructions provided previously at initial dry needling treatment.  Instructions reviewed, if requested by the patient, prior to subsequent dry needling treatment.   Patient Verbal Consent Given: Yes Education Handout Provided: Previously Provided Muscles Treated:right gluteal 3x using a .75x30 needle  Electrical Stimulation Performed: No Treatment Response/Outcome: strong muscle twitch  Manual: skilled palpation of trigger points; trigger point release to lumbar spine and right gluteal; review of self trigger poitnt release at home.   Manual: There-ex: Nu-step LTR 2x10  Yoga ball roll outs 5x 5 sec hold   Neuro-Re-ed  Banded ER yellow 3x10 with cuing for posture and breathing              Bilateral hip abduction seated yellow band with cuing for breathing and posture 3x10 RPE of 3 advised he can move to the red band          4/11 Manual:69min All PROM performed with distraction to reduce pain and improve movement STM to R glute and bilateral paraspinals (pt reported decrease in  tightness) Hip LAX    PATIENT EDUCATION:  Education details: HEP, symptom management, self care,exercise form Person educated: Patient Education method: Explanation, Demonstration, Tactile cues, Verbal cues Education comprehension: verbalized understanding and returned demonstration  HOME EXERCISE PROGRAM:  Access Code: [email or web address] URL: [email or web address] Date: 03/07/2024 Prepared by: Lonzell Robin  Exercises - Theracane In Back  - 1 x daily - 7 x weekly - 3 sets - 10 reps - Standing Glute Med Mobilization with Small Ball on Wall  - 1 x daily - 7 x weekly - 3 sets - 10 reps  ASSESSMENT:  CLINICAL IMPRESSION: PT instructed pt in correct form with exercises and he performed exercises well without c/o's.  PT performed STM to R glute and bilat lumbar paraspinals to improve soft tissue tightness and mobility and pain and to reduce myofascial restrictions.  Pt has trigger points in R glute.  He tolerated there-ex and manual therapy well.  Pt responded well to Rx reporting improved pain to 2/10 after Rx.  He also stated he felt better and didn't feel as tight after Rx.  He should benefit from cont skilled PT to address impairments and goals, reduce pain, and improve function.    OBJECTIVE IMPAIRMENTS: decreased mobility.   ACTIVITY LIMITATIONS: lifting, bending, squatting, and stairs  PARTICIPATION LIMITATIONS: community activity and yard work  PERSONAL FACTORS: 1-2 comorbidities: DDD of the spine   are also affecting patient's functional outcome.   REHAB POTENTIAL: Good  CLINICAL DECISION MAKING: Stable/uncomplicated  EVALUATION COMPLEXITY: Low   GOALS: Goals reviewed with patient? Yes  SHORT TERM GOALS: Target date: 03/21/2024  Pt will report decreased morning symptoms within  1 hour of waking up. Baseline: Goal status: INITIAL  2.  Pt will increase knee extension dynamometer strength by 5lbs. Baseline:  Goal status: INITIAL  3.  Pt will increase hip  abduction dynamometer strength by 5lbs Baseline:  Goal status: INITIAL    LONG TERM GOALS: Target date: 04/04/2024  Pt will be able to go up and down 8 stairs without increase in pain Baseline:  Goal status: INITIAL  2.  Pt will increase knee extension dynamometer strength by 10lbs  Baseline:  Goal status: INITIAL  3.  Pt will increase hip abduction dynamometer strength by 10lbs. Baseline:  Goal status: INITIAL    PLAN:  PT FREQUENCY: 1-2x/week  PT DURATION: 4 weeks  PLANNED INTERVENTIONS: 97164- PT Re-evaluation, 97110-Therapeutic exercises, 97530- Therapeutic activity, [id number]- Neuromuscular re-education, 97535- Self Care, 96045- Manual therapy, [id number]- Gait training, [phone]- Aquatic Therapy, [id number]- Electrical stimulation (unattended), and [id number]- Electrical stimulation (manual).  PLAN FOR NEXT SESSION: STM to lateral hip and lumbar paraspinals. Consider DN. Add stretches to HEP. Progress to gym activity as tolerated.  Pt has a PACEMAKER--NO E-STIM  Trina Fujita III PT, DPT 03/19/24 7:08 PM

## 2024-03-21 ENCOUNTER — Encounter (HOSPITAL_BASED_OUTPATIENT_CLINIC_OR_DEPARTMENT_OTHER): Payer: Self-pay | Admitting: Physical Therapy

## 2024-03-21 ENCOUNTER — Ambulatory Visit (HOSPITAL_BASED_OUTPATIENT_CLINIC_OR_DEPARTMENT_OTHER): Payer: Self-pay | Admitting: Physical Therapy

## 2024-03-21 DIAGNOSIS — M5459 Other low back pain: Secondary | ICD-10-CM

## 2024-03-21 DIAGNOSIS — R2689 Other abnormalities of gait and mobility: Secondary | ICD-10-CM

## 2024-03-21 NOTE — Therapy (Signed)
 OUTPATIENT PHYSICAL THERAPY THORACOLUMBAR EVALUATION   Patient Name: Jimmy Palmer MRN: [id number] DOB:09/01/1941, 83 y.o., male Today's Date: 03/21/2024  END OF SESSION:       Past Medical History:  Diagnosis Date   Abnormal electrocardiogram    Abnormal heart sounds    Allergy 2020   upon arrival to [facility]   Anemia 10/04/2023   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   Chronic kidney disease 11/07/2021   Depression 2020   receiving appropriate therapy   Herniated lumbar intervertebral disc    Hypertension    Keratosis    benign   Left bundle branch block    Pacemaker    Pure hypercholesterolemia    Sick sinus syndrome (HCC)    Sleep apnea    VT (ventricular tachycardia) (HCC)    Past Surgical History:  Procedure Laterality Date   CARDIOVERSION N/A 09/28/2020   Procedure: CARDIOVERSION;  Surgeon: Alroy Aspen Lela Purple, MD;  Location: [facility];  Service: Cardiovascular;  Laterality: N/A;   hip replacement Left    INSERT / REPLACE / REMOVE PACEMAKER     JOINT REPLACEMENT     partial right knee, medial side   PPM GENERATOR CHANGEOUT N/A 04/30/2023   Procedure: PPM GENERATOR CHANGEOUT;  Surgeon: Tammie Fall, MD;  Location: [facility];  Service: Cardiovascular;  Laterality: N/A;   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   Procedure: LEFT TOTAL HIP ARTHROPLASTY ANTERIOR APPROACH;  Surgeon: Arnie Lao, MD;  Location: [facility];  Service: Orthopedics;  Laterality: Left;   VASECTOMY     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  ONSET DATE: about 3 weeks ago  SUBJECTIVE:  SUBJECTIVE STATEMENT: The patient reports his back has been feeling a little better. He feels like the manual therapy is helping.     PERTINENT HISTORY:  Arthritis of left hip, depression, herniated lumbar IV disc, left BBB, THA left 2023, R partial knee replacement approx 2021 PACEMAKER--NO E-STIM  PAIN:  Are you having pain? Yes: NPRS scale: 3-4/10 Pain location: lower right back Pain description: achy, naggy, sharp at times Aggravating factors: lifting, going up steps Relieving factors: heat, tylenol , back brace  PRECAUTIONS: None  RED FLAGS: None   WEIGHT BEARING RESTRICTIONS: No  FALLS:  Has patient fallen in last 6 months? No  LIVING ENVIRONMENT: Steps on back deck  OCCUPATION: Retired  PLOF: Independent  PATIENT GOALS: get pain dialed down and get some exercises to strengthen  NEXT MD VISIT:   OBJECTIVE:  Note: Objective measures were completed at Evaluation unless otherwise noted.  DIAGNOSTIC FINDINGS:  1. No acute findings. 2. 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 3. 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 Moderate bilateral neural foraminal narrowing at the L4-5 level right worse than left. Minimal bilateral neural foraminal narrowing at L2-3 and L3-4. 4. Grade 1 anterolisthesis of L4 on L5 due to facet arthropathy.  PATIENT SURVEYS:  Oswestry:  fill out next visit  COGNITION: Overall cognitive status: Within functional limits for tasks assessed     SENSATION: No lack of sensation  MUSCLE LENGTH:   POSTURE: rounded shoulders and forward head  PALPATION: TTP lateral R glute and R paraspinal L5,S1 region  LUMBAR ROM:   AROM eval  Flexion WFL  Extension WFL  Right lateral flexion WFL  Left lateral flexion Mild pain on the right side   Right rotation   Left rotation    (Blank rows = not tested)  LOWER EXTREMITY ROM:     Passive  Right eval Left eval  Hip flexion 125   Hip extension    Hip abduction    Hip adduction    Hip internal rotation Painful int he right hip    Hip external rotation    Knee flexion    Knee extension    Ankle dorsiflexion    Ankle plantarflexion    Ankle inversion    Ankle eversion     (Blank rows = not tested)  LOWER EXTREMITY MMT:    MMT Right eval Left eval  Hip flexion 32.5 34.5  Hip extension    Hip abduction 54.0 44.8  Hip adduction    Hip internal rotation    Hip external rotation    Knee flexion    Knee extension 59.8 49.5  Ankle dorsiflexion    Ankle plantarflexion    Ankle inversion    Ankle eversion     (Blank rows = not tested)  LUMBAR SPECIAL TESTS:    FUNCTIONAL TESTS:    GAIT: Normal stride length, no decreased stance times on either leg  TREATMENT DATE:  4/25 Manual: skilled palpation of trigger points; trigger point release to lumbar spine and right gluteal; review of self trigger poitnt release at home. Lumbar PA mobilization L5-L3  There-ex:  LF leg press 20 lbs 3x12  LF hamstring curl 3x12 15 lbs  LF Knee extension 3x12 15 lbs   Ball roll out x10 each direction 5 sec hold   4/22  Therapeutic Exercise: Reviewed PMHx, pain level, and response to prior Rx.  Nustep L4 UE/Les x 5 mins PPT 2x10 Supine bridging  2x10 Bilateral hip abduction seated RTB 2x10  Manual Therapy:  STM to R glute in L S/L'ing with pillow b/w knees and to R glute and bilateral paraspinals in prone.   4/17 Manual: skilled palpation of trigger points; trigger point release to lumbar spine and right gluteal; review of self trigger poitnt release at home. Lumbar PA mobilization L5-L3   Neuro reEd Life fitness will 3 x 10 20 pounds RPE 3 Tricep press down life fitness 3 x 10 20 pounds RPE 3  Low back pain noted.  Worked on abdominal breathing and posture  12:40 minus  4/16 Manual: Trigger Point Dry Needling  Subsequent Treatment: Instructions provided previously at initial dry needling treatment.  Instructions reviewed, if requested by the patient, prior to subsequent dry needling treatment.   Patient Verbal Consent Given: Yes Education Handout Provided: Previously Provided Muscles Treated:right gluteal 3x using a .75x30 needle  Electrical Stimulation Performed: No Treatment Response/Outcome: strong muscle twitch  Manual: skilled palpation of trigger points; trigger point release to lumbar spine and right gluteal; review of self trigger poitnt release at home.   Manual: There-ex: Nu-step LTR 2x10  Yoga ball roll outs 5x 5 sec hold   Neuro-Re-ed  Banded ER yellow 3x10 with cuing for posture and breathing              Bilateral hip abduction seated yellow band with cuing for breathing and posture 3x10 RPE of 3 advised he can move to the red band          4/11 Manual:2min All PROM performed with distraction to reduce pain and improve movement STM to R glute and bilateral paraspinals (pt reported decrease in tightness) Hip LAX    PATIENT EDUCATION:  Education details: HEP, symptom management, self care,exercise form Person educated: Patient Education method: Explanation, Demonstration, Tactile cues, Verbal cues Education comprehension: verbalized understanding and  returned demonstration  HOME EXERCISE PROGRAM:  Access Code: [email or web address] URL: [email or web address] Date: 03/07/2024 Prepared by: Lonzell Robin  Exercises - Theracane In Back  - 1 x daily - 7 x weekly - 3 sets - 10 reps - Standing Glute Med Mobilization with Small Ball on Wall  - 1 x daily - 7 x weekly - 3 sets - 10 reps  ASSESSMENT:  CLINICAL IMPRESSION: Therapy continues to expand the patients gym program. We used his sheet from the last time he was at the gy, We reduced his weights slighltly. He tolerated well. He continues to have soreness in the trigger points but overall he is doing well.  OBJECTIVE IMPAIRMENTS: decreased mobility.   ACTIVITY LIMITATIONS: lifting, bending, squatting, and stairs  PARTICIPATION LIMITATIONS: community activity and yard work  PERSONAL FACTORS: 1-2 comorbidities: DDD of the spine   are also affecting patient's functional outcome.   REHAB POTENTIAL: Good  CLINICAL DECISION MAKING: Stable/uncomplicated  EVALUATION COMPLEXITY: Low   GOALS: Goals reviewed with patient? Yes  SHORT TERM GOALS: Target date: 03/21/2024  Pt  will report decreased morning symptoms within 1 hour of waking up. Baseline: Goal status: INITIAL  2.  Pt will increase knee extension dynamometer strength by 5lbs. Baseline:  Goal status: INITIAL  3.  Pt will increase hip abduction dynamometer strength by 5lbs Baseline:  Goal status: INITIAL    LONG TERM GOALS: Target date: 04/04/2024  Pt will be able to go up and down 8 stairs without increase in pain Baseline:  Goal status: INITIAL  2.  Pt will increase knee extension dynamometer strength by 10lbs  Baseline:  Goal status: INITIAL  3.  Pt will increase hip abduction dynamometer strength by 10lbs. Baseline:  Goal status: INITIAL    PLAN:  PT FREQUENCY: 1-2x/week  PT DURATION: 4 weeks  PLANNED INTERVENTIONS: 97164- PT Re-evaluation, 97110-Therapeutic exercises, 97530- Therapeutic activity,  [id number]- Neuromuscular re-education, 97535- Self Care, [id number]- Manual therapy, [id number]- Gait training, [phone]- Aquatic Therapy, [phone]- Electrical stimulation (unattended), and [id number]- Electrical stimulation (manual).  PLAN FOR NEXT SESSION: STM to lateral hip and lumbar paraspinals. Consider DN. Add stretches to HEP. Progress to gym activity as tolerated.  Pt has a PACEMAKER--NO E-STIM  Signa Drier PT DPT 03/21/24 1:02 PM

## 2024-03-24 ENCOUNTER — Encounter (HOSPITAL_BASED_OUTPATIENT_CLINIC_OR_DEPARTMENT_OTHER): Payer: Self-pay | Admitting: Physical Therapy

## 2024-03-24 ENCOUNTER — Ambulatory Visit (HOSPITAL_BASED_OUTPATIENT_CLINIC_OR_DEPARTMENT_OTHER): Admitting: Physical Therapy

## 2024-03-24 DIAGNOSIS — M5459 Other low back pain: Secondary | ICD-10-CM

## 2024-03-24 NOTE — Therapy (Signed)
 OUTPATIENT PHYSICAL THERAPY THORACOLUMBAR EVALUATION   Patient Name: Jimmy Palmer MRN: [id number] DOB:1941/03/28, 83 y.o., male Today's Date: 03/24/2024  END OF SESSION:  PT End of Session - 03/24/24 1448     Visit Number 6    Number of Visits 16    Date for PT Re-Evaluation 05/02/24    PT Start Time 1445    PT Stop Time 1528    PT Time Calculation (min) 43 min    Activity Tolerance Patient tolerated treatment well;No increased pain    Behavior During Therapy [facility] for tasks assessed/performed                 Past Medical History:  Diagnosis Date   Abnormal electrocardiogram    Abnormal heart sounds    Allergy 2020   upon arrival to [facility]   Anemia 10/04/2023   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   Chronic kidney disease 11/07/2021   Depression 2020   receiving appropriate therapy   Herniated lumbar intervertebral disc    Hypertension    Keratosis    benign   Left bundle branch block    Pacemaker    Pure hypercholesterolemia    Sick sinus syndrome (HCC)    Sleep apnea    VT (ventricular tachycardia) (HCC)    Past Surgical History:  Procedure Laterality Date   CARDIOVERSION N/A 09/28/2020   Procedure: CARDIOVERSION;  Surgeon: Nahser, Lela Purple, MD;  Location: [facility] ENDOS[facility];  Service: Cardiovascular;  Laterality: N/A;   hip replacement Left    INSERT / REPLACE / REMOVE PACEMAKER     JOINT REPLACEMENT     partial right knee, medial side   PPM GENERATOR CHANGEOUT N/A 04/30/2023   Procedure: PPM GENERATOR CHANGEOUT;  Surgeon: Tammie Fall, MD;  Location: [facility] INVASIVE CV LAB;  Service: Cardiovascular;  Laterality: N/A;   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   Procedure: LEFT TOTAL HIP ARTHROPLASTY ANTERIOR APPROACH;  Surgeon: Arnie Lao, MD;  Location: [facility] ORS;  Service: Orthopedics;  Laterality: Left;   VASECTOMY     Patient Active Problem List   Diagnosis Date Noted   Depression 10/04/2023   Anemia 10/04/2023   Hyperlipidemia  10/04/2023   Abscess of right index finger 02/28/2023   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   Left hip pain 02/01/2022   Environmental and seasonal allergies 02/01/2022   Hypertension 11/07/2021   Chronic kidney disease 11/07/2021   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 Reports Dr Daisey Dryer on Wed for bilat L4 transforaminal injection.   PERTINENT HISTORY:  Arthritis of left hip, depression, herniated lumbar IV disc, left BBB, THA left 2023, R partial knee replacement approx 2021 PACEMAKER--NO E-STIM  PAIN:  Are you having pain? Yes: NPRS scale: tightness/10 Pain location: lower right back Pain description: achy, naggy, sharp at times Aggravating factors: lifting, going up steps Relieving factors: heat, tylenol , back brace  PRECAUTIONS: None  RED FLAGS: None   WEIGHT BEARING RESTRICTIONS: No  FALLS:  Has patient fallen in last 6 months? No  LIVING ENVIRONMENT: Steps on back deck  OCCUPATION: Retired  PLOF: Independent  PATIENT GOALS: get pain dialed down and get some exercises to strengthen  NEXT MD VISIT:   OBJECTIVE:  Note: Objective measures were completed at Evaluation unless otherwise noted.  DIAGNOSTIC FINDINGS:  1. No acute findings. 2. Mild-to-moderate spondylosis throughout the lumbar spine to include  facet arthropathy. 3. 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 Moderate bilateral neural foraminal narrowing at the L4-5 level right worse than left. Minimal bilateral neural foraminal narrowing at L2-3 and L3-4. 4. Grade 1 anterolisthesis of L4 on L5 due to facet arthropathy.  PATIENT SURVEYS:  Oswestry: fill out next visit  COGNITION: Overall cognitive status: Within functional limits for tasks assessed     SENSATION: No lack of sensation  MUSCLE LENGTH:   POSTURE: rounded shoulders and forward head  PALPATION: TTP lateral R glute and R paraspinal L5,S1 region  LUMBAR ROM:   AROM eval  Flexion WFL  Extension WFL  Right lateral flexion WFL  Left lateral flexion Mild pain on the right side   Right rotation   Left rotation    (Blank rows = not tested)  LOWER EXTREMITY ROM:     Passive  Right eval Left eval  Hip flexion 125   Hip extension    Hip abduction    Hip adduction    Hip internal rotation Painful int he right hip    Hip external rotation    Knee flexion    Knee extension    Ankle dorsiflexion    Ankle plantarflexion    Ankle inversion    Ankle eversion     (Blank rows = not tested)  LOWER EXTREMITY MMT:    MMT Right eval Left eval Rt/Lt  Hip flexion 32.5 34.5   Hip extension     Hip abduction 54.0 44.8 41.9/36.5  Hip adduction     Hip internal rotation     Hip external rotation     Knee flexion     Knee extension 59.8 49.5 49.1/51.0  Ankle dorsiflexion     Ankle plantarflexion     Ankle inversion     Ankle eversion      (Blank rows = not tested)  GAIT: Normal stride length, no decreased stance times on either leg  TREATMENT DATE:  4/28 STM Lt hp Supine piriformis stretch Mod thomas test Upper body gym machines- replaced row and lat pull  machine with cable pulleys for core engagement   4/25 Manual: skilled palpation of trigger points; trigger point release to lumbar spine and right gluteal; review of self trigger poitnt release at home. Lumbar PA mobilization L5-L3  There-ex:  LF leg press 20 lbs 3x12  LF hamstring curl 3x12 15 lbs  LF Knee extension 3x12 15 lbs   Ball roll out x10 each direction 5 sec hold   4/22  Therapeutic Exercise: Reviewed PMHx, pain level, and response to prior Rx.  Nustep L4 UE/Les x 5 mins PPT 2x10 Supine bridging 2x10 Bilateral hip abduction seated RTB 2x10  Manual Therapy:  STM to R glute in L S/L'ing with pillow b/w knees and to R glute and bilateral paraspinals in prone.      PATIENT EDUCATION:  Education details: HEP, symptom management, self care,exercise form Person educated: Patient Education method: Explanation, Demonstration, Tactile cues, Verbal cues Education comprehension: verbalized understanding and returned demonstration  HOME EXERCISE PROGRAM:  Access Code: [email or web address] URL: [email or web address] Date: 03/07/2024 Prepared by: Lonzell Robin  Exercises - Theracane In Back  - 1 x daily - 7 x weekly - 3 sets - 10 reps - Standing Glute Med Mobilization with Small Ball on Wall  - 1 x daily - 7 x weekly - 3 sets - 10 reps  ASSESSMENT:  CLINICAL IMPRESSION: Pt reported decrease in concordant pain following manual therapy. Worked upper body machines today as last viist focused on lower. Pt tolerated exercises well and was encouraged to continue with lighter weights as he works back into a comfortable gym program.    OBJECTIVE IMPAIRMENTS: decreased mobility.   ACTIVITY LIMITATIONS: lifting, bending, squatting, and stairs  PARTICIPATION LIMITATIONS: community activity and yard work  PERSONAL FACTORS: 1-2 comorbidities: DDD of the spine   are also affecting patient's functional outcome.   REHAB POTENTIAL: Good  CLINICAL DECISION MAKING:  Stable/uncomplicated  EVALUATION COMPLEXITY: Low   GOALS: Goals reviewed with patient? Yes  SHORT TERM GOALS: Target date: 03/21/2024   Pt will report decreased morning symptoms within 1 hour of waking up. Baseline: Goal status: achieved  2.  Pt will increase knee extension dynamometer strength by 5lbs. Baseline:  Goal status: ongoing- see objective  3.  Pt will increase hip abduction dynamometer strength by 5lbs Baseline:  Goal status: ongoing- see objective    LONG TERM GOALS: Target date: 04/04/2024  Pt will be able to go up and down 8 stairs without increase in pain Baseline:  Goal status: INITIAL  2.  Pt will increase knee extension dynamometer strength by 10lbs  Baseline:  Goal status: INITIAL  3.  Pt will increase hip abduction dynamometer strength by 10lbs. Baseline:  Goal status: INITIAL    PLAN:  PT FREQUENCY: 1-2x/week  PT DURATION: 4 weeks  PLANNED INTERVENTIONS: 97164- PT Re-evaluation, 97110-Therapeutic exercises, 97530- Therapeutic activity, [id number]- Neuromuscular re-education, 97535- Self Care, 54098- Manual therapy, [id number]- Gait training, [phone]- Aquatic Therapy, [phone]- Electrical stimulation (unattended), and [id number]- Electrical stimulation (manual).  PLAN FOR NEXT SESSION: STM to lateral hip and lumbar paraspinals. Consider DN. Add stretches to HEP. Progress to gym activity as tolerated.  Pt has a PACEMAKER--NO E-STIM  Sydney Hasten C. Aedan Geimer PT, DPT 03/24/24 4:57 PM

## 2024-03-26 ENCOUNTER — Ambulatory Visit (INDEPENDENT_AMBULATORY_CARE_PROVIDER_SITE_OTHER): Admitting: Physical Medicine and Rehabilitation

## 2024-03-26 ENCOUNTER — Other Ambulatory Visit: Payer: Self-pay

## 2024-03-26 VITALS — BP 145/77 | HR 73

## 2024-03-26 DIAGNOSIS — M48062 Spinal stenosis, lumbar region with neurogenic claudication: Secondary | ICD-10-CM

## 2024-03-26 DIAGNOSIS — M4316 Spondylolisthesis, lumbar region: Secondary | ICD-10-CM

## 2024-03-26 DIAGNOSIS — Z96642 Presence of left artificial hip joint: Secondary | ICD-10-CM | POA: Diagnosis not present

## 2024-03-26 DIAGNOSIS — M5416 Radiculopathy, lumbar region: Secondary | ICD-10-CM | POA: Diagnosis not present

## 2024-03-26 MED ORDER — METHYLPREDNISOLONE ACETATE 40 MG/ML IJ SUSP
40.0000 mg | Freq: Once | INTRAMUSCULAR | Status: AC
Start: 2024-03-26 — End: 2024-03-26
  Administered 2024-03-26: 40 mg

## 2024-03-26 NOTE — Progress Notes (Signed)
 Pain Scale   Average Pain 4 Patient advising that his pain is constant in his lower back and radiates to his Right hip.        +Driver, -BT, -Dye Allergies.

## 2024-03-26 NOTE — Patient Instructions (Signed)

## 2024-03-27 ENCOUNTER — Encounter (HOSPITAL_BASED_OUTPATIENT_CLINIC_OR_DEPARTMENT_OTHER): Payer: Self-pay | Admitting: Physical Therapy

## 2024-03-27 ENCOUNTER — Other Ambulatory Visit (HOSPITAL_BASED_OUTPATIENT_CLINIC_OR_DEPARTMENT_OTHER): Payer: Medicare Other

## 2024-03-27 ENCOUNTER — Encounter: Payer: Self-pay | Admitting: Physical Medicine and Rehabilitation

## 2024-03-27 ENCOUNTER — Ambulatory Visit (HOSPITAL_BASED_OUTPATIENT_CLINIC_OR_DEPARTMENT_OTHER): Attending: Family Medicine | Admitting: Physical Therapy

## 2024-03-27 DIAGNOSIS — Z96642 Presence of left artificial hip joint: Secondary | ICD-10-CM | POA: Diagnosis present

## 2024-03-27 DIAGNOSIS — E785 Hyperlipidemia, unspecified: Secondary | ICD-10-CM

## 2024-03-27 DIAGNOSIS — M5459 Other low back pain: Secondary | ICD-10-CM | POA: Insufficient documentation

## 2024-03-27 DIAGNOSIS — D649 Anemia, unspecified: Secondary | ICD-10-CM

## 2024-03-27 DIAGNOSIS — M25551 Pain in right hip: Secondary | ICD-10-CM | POA: Diagnosis present

## 2024-03-27 DIAGNOSIS — R2689 Other abnormalities of gait and mobility: Secondary | ICD-10-CM | POA: Insufficient documentation

## 2024-03-27 DIAGNOSIS — M25552 Pain in left hip: Secondary | ICD-10-CM | POA: Diagnosis present

## 2024-03-27 DIAGNOSIS — N1831 Chronic kidney disease, stage 3a: Secondary | ICD-10-CM

## 2024-03-27 NOTE — Progress Notes (Signed)
 Jimmy Palmer - 83 y.o. male MRN [id number]  Date of birth: 10-02-1941  Office Visit Note: Visit Date: 03/26/2024 PCP: Eddie Good [place], Alonza Jansky, MD Referred by: de [place], [name], MD  Subjective: Chief Complaint  Patient presents with   Lower Back - Pain   HPI: Jimmy Palmer is a 83 y.o. male who comes in today for evaluation management of acute flareup of chronic low back and right hip and leg pain.  He reports 3 weeks ago he was kneeling down trying to change a propane tank under his grill and it was in awkward position with a pretty heavy couple of tanks that he felt pretty acute onset of pain in that right lower back right buttock lateral hip.  Some referral at times into the thigh and leg.  Nothing really left-sided.  He reports since that time having some dry needling at physical therapy at [facility].  He does work out quite a bit at [employer] well.  He has been very happy with that group over there and just loves the physical therapy there and continues to do that.  He is a very active 83 year old gentleman and just really stays pretty active with much regard for his back but doing pretty well overall.  He does have severe lumbar stenosis at L4-5.  He has responded to epidural injection in the past for this.  A little bit of less insight into chronic versus acute symptoms in the lumbar stenosis symptoms versus just things he has heard of with sciatica and disc herniations etc.  His condition is complicated by prior hip replacement osteoarthritis and he is on anticoagulation.  He has not had any focal weakness bowel or bladder changes or fevers chills or night sweats.   I spent more than 30 minutes speaking face-to-face with the patient with 50% of the time in counseling and discussing coordination of care.      Review of Systems  Musculoskeletal:  Positive for back pain and joint pain.  Neurological:  Positive for tingling.  All other systems reviewed and are negative.  Otherwise per  HPI.  Assessment & Plan: Visit Diagnoses:    ICD-10-CM   1. Lumbar radiculopathy  M54.16 XR C-ARM NO REPORT    Epidural Steroid injection    methylPREDNISolone  acetate (DEPO-MEDROL ) injection 40 mg    2. Spinal stenosis of lumbar region with neurogenic claudication  M48.062     3. Spondylolisthesis of lumbar region  M43.16     4. History of left hip replacement  Z96.642        Plan: Findings:  Chronic worsening and severe right more than left low back pain with referral pattern into the hip and thigh consistent with an L5 dermatome.  I think this is a flareup of his lumbar stenosis and possibly a situation where he did tweak his back either muscular pain versus discogenic type tweaking.  He is in physical therapy continues with that but it just is really getting down at this point and he is usually very active.  Given the severity of the symptoms we completed a right L4 transforaminal injection today at the level of severe stenosis.  Will see how he does over time.  If he does not get much better with the injection it probably points to this being more of a lumbar strain more than just simply lumbar stenosis but is very hard from a history standpoint to gather this knowledge.    Meds & Orders:  Meds  ordered this encounter  Medications   methylPREDNISolone  acetate (DEPO-MEDROL ) injection 40 mg    Orders Placed This Encounter  Procedures   XR C-ARM NO REPORT   Epidural Steroid injection    Follow-up: No follow-ups on file.   Procedures: No procedures performed  Lumbosacral Transforaminal Epidural Steroid Injection - Sub-Pedicular Approach with Fluoroscopic Guidance  Patient: Jimmy Palmer      Date of Birth: 1941-04-21 MRN: [id number] PCP: de [place], [name], MD      Visit Date: 03/26/2024   Universal Protocol:    Date/Time: 03/26/2024  Consent Given By: the patient  Position: PRONE  Additional Comments: Vital signs were monitored before and after the procedure. Patient  was prepped and draped in the usual sterile fashion. The correct patient, procedure, and site was verified.   Injection Procedure Details:   Procedure diagnoses: Lumbar radiculopathy [M54.16]    Meds Administered:  Meds ordered this encounter  Medications   methylPREDNISolone  acetate (DEPO-MEDROL ) injection 40 mg    Laterality: Right  Location/Site: L4  Needle:5.0 in., 22 ga.  Short bevel or Quincke spinal needle  Needle Placement: Transforaminal  Findings:    -Comments: Excellent flow of contrast along the nerve, nerve root and into the epidural space.  Procedure Details: After squaring off the end-plates to get a true AP view, the C-arm was positioned so that an oblique view of the foramen as noted above was visualized. The target area is just inferior to the "nose of the scotty dog" or sub pedicular. The soft tissues overlying this structure were infiltrated with 2-3 ml. of 1% Lidocaine  without Epinephrine.  The spinal needle was inserted toward the target using a "trajectory" view along the fluoroscope beam.  Under AP and lateral visualization, the needle was advanced so it did not puncture dura and was located close the 6 O'Clock position of the pedical in AP tracterory. Biplanar projections were used to confirm position. Aspiration was confirmed to be negative for CSF and/or blood. A 1-2 ml. volume of Isovue-250 was injected and flow of contrast was noted at each level. Radiographs were obtained for documentation purposes.   After attaining the desired flow of contrast documented above, a 0.5 to 1.0 ml test dose of 0.25% Marcaine  was injected into each respective transforaminal space.  The patient was observed for 90 seconds post injection.  After no sensory deficits were reported, and normal lower extremity motor function was noted,   the above injectate was administered so that equal amounts of the injectate were placed at each foramen (level) into the transforaminal epidural  space.   Additional Comments:  The patient tolerated the procedure well Dressing: 2 x 2 sterile gauze and Band-Aid    Post-procedure details: Patient was observed during the procedure. Post-procedure instructions were reviewed.  Patient left the clinic in stable condition.    Clinical History: EXAM: CT LUMBAR SPINE WITHOUT CONTRAST   TECHNIQUE: Multidetector CT imaging of the lumbar spine was performed without intravenous contrast administration. Multiplanar CT image reconstructions were also generated.   RADIATION DOSE REDUCTION: This exam was performed according to the departmental dose-optimization program which includes automated exposure control, adjustment of the mA and/or kV according to patient size and/or use of iterative reconstruction technique.   COMPARISON:  Lumbar spine series 10/30/2022   FINDINGS: Segmentation: 5 lumbar type vertebrae.   Alignment: Grade 1 anterolisthesis of L4 on L5 due to facet arthropathy.   Vertebrae: Vertebral body heights are maintained. There is mild-to-moderate spondylosis throughout the lumbar  spine to include facet arthropathy.   Paraspinal and other soft tissues: Minimal calcified plaque of the abdominal aorta which is normal in caliber. No other significant soft tissue abnormality.   Disc levels: Moderate disc space narrowing at the L4-5 level and to lesser extent at the L2-3 level and L3-4 levels.   L1-2 level demonstrates no significant disc disease. No spinal canal stenosis or neural foraminal narrowing.   L2-3 level demonstrates a moderate broad-based disc bulge. No significant canal stenosis. Minimal bilateral neural foraminal narrowing.   L3-4 level demonstrates a moderate broad-based disc bulge. There is minimal canal stenosis due to disc disease, facet arthropathy and ligamentum flavum hypertrophy. Mild bilateral neural foraminal narrowing.   L4-5 level demonstrates a moderate broad-based disc bulge 2.  There is moderate spinal canal stenosis due to disc disease, ligamentum flavum hypertrophy and facet arthropathy. Moderate bilateral neural foraminal narrowing right worse than left.   L5-S1 level demonstrates no significant disc disease or spinal canal stenosis. No significant neural foraminal narrowing.   IMPRESSION: 1. No acute findings. 2. 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 3. 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 Moderate bilateral neural foraminal narrowing at the L4-5 level right worse than left. Minimal bilateral neural foraminal narrowing at L2-3 and L3-4. 4. Grade 1 anterolisthesis of L4 on L5 due to facet arthropathy.     Electronically Signed   By: Roda Cirri M.D.   On: 01/04/2023 13:23   He reports that he has quit smoking. His smoking use included cigars. He has been exposed to tobacco smoke. He has never used smokeless tobacco. No results for input(s): "HGBA1C", "LABURIC" in the last 8760 hours.  Objective:  VS:  HT:    WT:   BMI:     BP:(!) 145/77  HR:73bpm  TEMP: ( )  RESP:  Physical Exam Vitals and nursing note reviewed.  Constitutional:      General: He is not in acute distress.    Appearance: Normal appearance. He is well-developed. He is not ill-appearing.  HENT:     Head: Normocephalic and atraumatic.     Right Ear: External ear normal.     Left Ear: External ear normal.     Nose: No congestion.  Eyes:     Extraocular Movements: Extraocular movements intact.     Conjunctiva/sclera: Conjunctivae normal.     Pupils: Pupils are equal, round, and reactive to light.  Cardiovascular:     Rate and Rhythm: Normal rate.     Pulses: Normal pulses.     Heart sounds: Normal heart sounds.  Pulmonary:     Effort: Pulmonary effort is normal. No respiratory distress.  Abdominal:     General: There is no distension.     Palpations:  Abdomen is soft.  Musculoskeletal:        General: Tenderness present. No signs of injury.     Cervical back: Normal range of motion and neck supple. No rigidity.     Right lower leg: No edema.     Left lower leg: No edema.     Comments: Patient has good distal strength without clonus.  Skin:    General: Skin is warm and dry.     Findings: No erythema or rash.  Neurological:     General: No focal deficit present.     Mental Status: He is alert and oriented to person, place, and time.  Cranial Nerves: No cranial nerve deficit.     Sensory: No sensory deficit.     Motor: No weakness or abnormal muscle tone.     Coordination: Coordination normal.     Gait: Gait abnormal.  Psychiatric:        Mood and Affect: Mood normal.        Behavior: Behavior normal.     Ortho Exam  Imaging: XR C-ARM NO REPORT Result Date: 03/26/2024 Please see Notes tab for imaging impression.   Past Medical/Family/Surgical/Social History: Medications & Allergies reviewed per EMR, new medications updated. 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   Atrial tachycardia (HCC) 08/26/2020   Pacemaker - MDT 08/26/2020   Past Medical History:  Diagnosis Date   Abnormal electrocardiogram    Abnormal heart sounds    Allergy 2020   upon arrival to [facility]   Anemia 10/04/2023   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   Chronic kidney disease 11/07/2021   Depression 2020   receiving appropriate therapy   Herniated lumbar intervertebral disc    Hypertension    Keratosis    benign   Left bundle branch block     Pacemaker    Pure hypercholesterolemia    Sick sinus syndrome (HCC)    Sleep apnea    VT (ventricular tachycardia) (HCC)    Family History  Problem Relation Age of Onset   Arthritis Mother    Hypertension Mother    Hypertension Father    Past Surgical History:  Procedure Laterality Date   CARDIOVERSION N/A 09/28/2020   Procedure: CARDIOVERSION;  Surgeon: Nahser, Lela Purple, MD;  Location: [facility];  Service: Cardiovascular;  Laterality: N/A;   hip replacement Left    INSERT / REPLACE / REMOVE PACEMAKER     JOINT REPLACEMENT     partial right knee, medial side   PPM GENERATOR CHANGEOUT N/A 04/30/2023   Procedure: PPM GENERATOR CHANGEOUT;  Surgeon: Tammie Fall, MD;  Location: [facility];  Service: Cardiovascular;  Laterality: N/A;   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   Procedure: LEFT TOTAL HIP ARTHROPLASTY ANTERIOR APPROACH;  Surgeon: Arnie Lao, MD;  Location: [facility];  Service: Orthopedics;  Laterality: Left;   VASECTOMY     Social History   Occupational History   Not on file  Tobacco Use   Smoking status: Former    Types: Cigars    Passive exposure: Past   Smokeless tobacco: Never   Tobacco comments:    Smoked cigars until the early 2000's  Vaping Use   Vaping status: Never Used  Substance and Sexual Activity   Alcohol use: Yes    Alcohol/week: 14.0 standard drinks of alcohol    Types: 7 Glasses of wine, 7 Shots of liquor per week    Comment: 1-2 cocktails every day and 1 glass of wine every day   Drug use: Never   Sexual activity: Not Currently

## 2024-03-27 NOTE — Procedures (Signed)
 Lumbosacral Transforaminal Epidural Steroid Injection - Sub-Pedicular Approach with Fluoroscopic Guidance  Patient: Jimmy Palmer      Date of Birth: [birth year] MRN: [id number] PCP: de [place], [name], MD      Visit Date: 03/26/2024   Universal Protocol:    Date/Time: 03/26/2024  Consent Given By: the patient  Position: PRONE  Additional Comments: Vital signs were monitored before and after the procedure. Patient was prepped and draped in the usual sterile fashion. The correct patient, procedure, and site was verified.   Injection Procedure Details:   Procedure diagnoses: Lumbar radiculopathy [M54.16]    Meds Administered:  Meds ordered this encounter  Medications   methylPREDNISolone  acetate (DEPO-MEDROL ) injection 40 mg    Laterality: Right  Location/Site: L4  Needle:5.0 in., 22 ga.  Short bevel or Quincke spinal needle  Needle Placement: Transforaminal  Findings:    -Comments: Excellent flow of contrast along the nerve, nerve root and into the epidural space.  Procedure Details: After squaring off the end-plates to get a true AP view, the C-arm was positioned so that an oblique view of the foramen as noted above was visualized. The target area is just inferior to the "nose of the scotty dog" or sub pedicular. The soft tissues overlying this structure were infiltrated with 2-3 ml. of 1% Lidocaine  without Epinephrine.  The spinal needle was inserted toward the target using a "trajectory" view along the fluoroscope beam.  Under AP and lateral visualization, the needle was advanced so it did not puncture dura and was located close the 6 O'Clock position of the pedical in AP tracterory. Biplanar projections were used to confirm position. Aspiration was confirmed to be negative for CSF and/or blood. A 1-2 ml. volume of Isovue-250 was injected and flow of contrast was noted at each level. Radiographs were obtained for documentation purposes.   After attaining the desired flow  of contrast documented above, a 0.5 to 1.0 ml test dose of 0.25% Marcaine  was injected into each respective transforaminal space.  The patient was observed for 90 seconds post injection.  After no sensory deficits were reported, and normal lower extremity motor function was noted,   the above injectate was administered so that equal amounts of the injectate were placed at each foramen (level) into the transforaminal epidural space.   Additional Comments:  The patient tolerated the procedure well Dressing: 2 x 2 sterile gauze and Band-Aid    Post-procedure details: Patient was observed during the procedure. Post-procedure instructions were reviewed.  Patient left the clinic in stable condition.

## 2024-03-27 NOTE — Therapy (Signed)
 OUTPATIENT PHYSICAL THERAPY THORACOLUMBAR EVALUATION   Patient Name: DAIAN SCHLEGEL MRN: [id number] DOB:[birth year], 83 y.o., male Today's Date: 03/27/2024  END OF SESSION:  PT End of Session - 03/27/24 1349     Visit Number 7    Number of Visits 16    Date for PT Re-Evaluation 05/02/24    PT Start Time 1346    PT Stop Time 1415    PT Time Calculation (min) 29 min    Activity Tolerance Patient tolerated treatment well;No increased pain    Behavior During Therapy [facility] for tasks assessed/performed                 Past Medical History:  Diagnosis Date   Abnormal electrocardiogram    Abnormal heart sounds    Allergy 2020   upon arrival to [facility]   Anemia 10/04/2023   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   Chronic kidney disease 11/07/2021   Depression 2020   receiving appropriate therapy   Herniated lumbar intervertebral disc    Hypertension    Keratosis    benign   Left bundle branch block    Pacemaker    Pure hypercholesterolemia    Sick sinus syndrome (HCC)    Sleep apnea    VT (ventricular tachycardia) (HCC)    Past Surgical History:  Procedure Laterality Date   CARDIOVERSION N/A 09/28/2020   Procedure: CARDIOVERSION;  Surgeon: Nahser, Lela Purple, MD;  Location: [facility] ENDOSCOPY;  Service: Cardiovascular;  Laterality: N/A;   hip replacement Left    INSERT / REPLACE / REMOVE PACEMAKER     JOINT REPLACEMENT     partial right knee, medial side   PPM GENERATOR CHANGEOUT N/A 04/30/2023   Procedure: PPM GENERATOR CHANGEOUT;  Surgeon: Tammie Fall, MD;  Location: [facility] INVASIVE CV LAB;  Service: Cardiovascular;  Laterality: N/A;   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   Procedure: LEFT TOTAL HIP ARTHROPLASTY ANTERIOR APPROACH;  Surgeon: Arnie Lao, MD;  Location: [facility] ORS;  Service: Orthopedics;  Laterality: Left;   VASECTOMY     Patient Active Problem List   Diagnosis Date Noted   Depression 10/04/2023   Anemia 10/04/2023   Hyperlipidemia  10/04/2023   Abscess of right index finger 02/28/2023   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   Left hip pain 02/01/2022   Environmental and seasonal allergies 02/01/2022   Hypertension 11/07/2021   Chronic kidney disease 11/07/2021   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 I am dragging today though. Injection seemed to kick in by the time I get home.   PERTINENT HISTORY:  Arthritis of left hip, depression, herniated lumbar IV disc, left BBB, THA left 2023, R partial knee replacement approx 2021 PACEMAKER--NO E-STIM  PAIN:  Are you having pain? Yes: NPRS scale: tightness/10 Pain location: lower right back Pain description: achy, naggy, sharp at times Aggravating factors: lifting, going up steps Relieving factors: heat, tylenol , back brace  PRECAUTIONS: None  RED FLAGS: None   WEIGHT BEARING RESTRICTIONS: No  FALLS:  Has patient fallen in last 6 months? No  LIVING ENVIRONMENT: Steps on back deck  OCCUPATION: Retired  PLOF: Independent  PATIENT GOALS: get pain dialed down and get some exercises to strengthen   OBJECTIVE:  Note: Objective measures were completed at Evaluation unless otherwise noted.  DIAGNOSTIC FINDINGS:  1. No acute findings. 2. 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 3. 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 Moderate bilateral neural foraminal narrowing at the L4-5 level right worse than left. Minimal bilateral neural foraminal narrowing at L2-3 and L3-4. 4. Grade 1 anterolisthesis of L4 on L5 due to facet arthropathy.  PATIENT SURVEYS:  Oswestry: fill out next visit  COGNITION: Overall cognitive status: Within functional limits for tasks assessed     SENSATION: No lack of sensation  POSTURE: rounded shoulders and forward head  PALPATION: TTP lateral R glute and R paraspinal L5,S1 region  LUMBAR ROM:   AROM eval  Flexion WFL  Extension WFL  Right lateral flexion WFL  Left lateral flexion Mild pain on the right side   Right rotation   Left rotation    (Blank rows = not tested)  LOWER EXTREMITY ROM:     Passive  Right eval Left eval  Hip flexion 125   Hip extension    Hip abduction    Hip adduction    Hip internal rotation Painful int he right hip    Hip external rotation    Knee flexion    Knee extension    Ankle dorsiflexion    Ankle plantarflexion    Ankle inversion    Ankle eversion     (Blank rows = not tested)  LOWER EXTREMITY MMT:    MMT Right eval Left eval Rt/Lt  Hip flexion 32.5 34.5   Hip extension     Hip abduction 54.0 44.8 41.9/36.5  Hip adduction     Hip internal rotation     Hip external rotation     Knee flexion     Knee extension 59.8 49.5 49.1/51.0  Ankle dorsiflexion     Ankle plantarflexion     Ankle inversion     Ankle eversion      (Blank rows = not tested)  GAIT: Normal stride length, no decreased stance times on either leg  TREATMENT DATE:  5/1 STM bil gluts, piriformis Passive quad stretch Supine mod thomas stretch Recumb. Bike   4/28 STM Lt  hp Supine piriformis stretch Mod thomas test Upper body gym machines- replaced row and lat pull machine with cable pulleys for core engagement   4/25 Manual: skilled palpation of trigger points; trigger point release to lumbar spine and right gluteal; review of self trigger poitnt release at home. Lumbar PA mobilization L5-L3  There-ex:  LF leg press 20 lbs 3x12  LF hamstring curl 3x12 15 lbs  LF Knee extension 3x12 15 lbs   Ball roll out x10 each direction 5 sec hold   4/22  Therapeutic Exercise: Reviewed PMHx, pain level, and response to prior Rx.  Nustep L4 UE/Les x 5 mins PPT 2x10 Supine bridging 2x10 Bilateral hip abduction seated RTB 2x10  Manual Therapy:  STM to R glute in L S/L'ing with pillow b/w knees and to R glute and bilateral paraspinals in prone.      PATIENT EDUCATION:  Education details: HEP, symptom management, self care,exercise form Person educated: Patient Education method: Explanation, Demonstration, Tactile cues, Verbal cues Education comprehension: verbalized understanding and returned demonstration  HOME EXERCISE PROGRAM:  Access Code: [email or web address] URL: [email or web address] Date: 03/07/2024 Prepared by: Lonzell Robin  Exercises - Theracane In Back  - 1 x daily - 7 x weekly - 3 sets - 10 reps - Standing Glute Med Mobilization with Small Ball on Wall  - 1 x daily - 7 x weekly - 3 sets - 10 reps  ASSESSMENT:  CLINICAL IMPRESSION: Focused on manual therapy and stretching today as he was feeling like he was dragging. Educated that his symptoms are common after injections as the body reacts to the needle and fluid taking up space and equate reactions to a "sugar crash"  pt reported feeling better after session today and was left on bike in [place] with request for a 15 min low-impact ride.    OBJECTIVE IMPAIRMENTS: decreased mobility.   ACTIVITY LIMITATIONS: lifting, bending, squatting, and stairs  PARTICIPATION LIMITATIONS:  community activity and yard work  PERSONAL FACTORS: 1-2 comorbidities: DDD of the spine   are also affecting patient's functional outcome.   REHAB POTENTIAL: Good  CLINICAL DECISION MAKING: Stable/uncomplicated  EVALUATION COMPLEXITY: Low   GOALS: Goals reviewed with patient? Yes  SHORT TERM GOALS: Target date: 03/21/2024   Pt will report decreased morning symptoms within 1 hour of waking up. Baseline: Goal status: achieved  2.  Pt will increase knee extension dynamometer strength by 5lbs. Baseline:  Goal status: ongoing- see objective  3.  Pt will increase hip abduction dynamometer strength by 5lbs Baseline:  Goal status: ongoing- see objective    LONG TERM GOALS: Target date: 04/04/2024  Pt will be able to go up and down 8 stairs without increase in pain Baseline:  Goal status: INITIAL  2.  Pt will increase knee extension dynamometer strength by 10lbs  Baseline:  Goal status: INITIAL  3.  Pt will increase hip abduction dynamometer strength by 10lbs. Baseline:  Goal status: INITIAL    PLAN:  PT FREQUENCY: 1-2x/week  PT DURATION: 4 weeks  PLANNED INTERVENTIONS: 97164- PT Re-evaluation, 97110-Therapeutic exercises, 97530- Therapeutic activity, [id number]- Neuromuscular re-education, 97535- Self Care, [id number]- Manual therapy, [id number]- Gait training, [phone]- Aquatic Therapy, [phone]- Electrical stimulation (unattended), and [id number]- Electrical stimulation (manual).  PLAN FOR NEXT SESSION: STM to lateral hip and lumbar paraspinals. Consider DN. Add stretches to HEP. Progress to gym activity as tolerated.  Pt has a PACEMAKER--NO E-STIM  Maurilio Puryear C. Evan Osburn PT, DPT 03/27/24 2:18 PM

## 2024-03-28 ENCOUNTER — Other Ambulatory Visit (HOSPITAL_BASED_OUTPATIENT_CLINIC_OR_DEPARTMENT_OTHER): Payer: Self-pay

## 2024-03-28 LAB — CBC WITH DIFFERENTIAL/PLATELET
Basophils Absolute: 0 10*3/uL (ref 0.0–0.2)
Basos: 0 %
EOS (ABSOLUTE): 0 10*3/uL (ref 0.0–0.4)
Eos: 0 %
Hematocrit: 36.1 % — ABNORMAL LOW (ref 37.5–51.0)
Hemoglobin: 12.1 g/dL — ABNORMAL LOW (ref 13.0–17.7)
Immature Grans (Abs): 0.1 10*3/uL (ref 0.0–0.1)
Immature Granulocytes: 1 %
Lymphocytes Absolute: 1.7 10*3/uL (ref 0.7–3.1)
Lymphs: 15 %
MCH: 31.7 pg (ref 26.6–33.0)
MCHC: 33.5 g/dL (ref 31.5–35.7)
MCV: 95 fL (ref 79–97)
Monocytes Absolute: 1.3 10*3/uL — ABNORMAL HIGH (ref 0.1–0.9)
Monocytes: 12 %
Neutrophils Absolute: 8.4 10*3/uL — ABNORMAL HIGH (ref 1.4–7.0)
Neutrophils: 72 %
Platelets: 248 10*3/uL (ref 150–450)
RBC: 3.82 x10E6/uL — ABNORMAL LOW (ref 4.14–5.80)
RDW: 11.8 % (ref 11.6–15.4)
WBC: 11.5 10*3/uL — ABNORMAL HIGH (ref 3.4–10.8)

## 2024-03-28 LAB — BASIC METABOLIC PANEL WITH GFR
BUN/Creatinine Ratio: 16 (ref 10–24)
BUN: 24 mg/dL (ref 8–27)
CO2: 21 mmol/L (ref 20–29)
Calcium: 9.4 mg/dL (ref 8.6–10.2)
Chloride: 99 mmol/L (ref 96–106)
Creatinine, Ser: 1.46 mg/dL — ABNORMAL HIGH (ref 0.76–1.27)
Glucose: 84 mg/dL (ref 70–99)
Potassium: 4.2 mmol/L (ref 3.5–5.2)
Sodium: 135 mmol/L (ref 134–144)
eGFR: 48 mL/min/{1.73_m2} — ABNORMAL LOW (ref >59)

## 2024-03-28 LAB — LIPID PANEL
Chol/HDL Ratio: 2.1 ratio (ref 0.0–5.0)
Cholesterol, Total: 172 mg/dL (ref 100–199)
HDL: 82 mg/dL (ref >39)
LDL Chol Calc (NIH): 69 mg/dL (ref 0–99)
Triglycerides: 120 mg/dL (ref 0–149)
VLDL Cholesterol Cal: 21 mg/dL (ref 5–40)

## 2024-03-30 NOTE — Therapy (Signed)
 OUTPATIENT PHYSICAL THERAPY THORACOLUMBAR EVALUATION   Patient Name: Jimmy Palmer MRN: [id number] DOB:November 29, 1940, 83 y.o., male Today's Date: 04/01/2024  END OF SESSION:  PT End of Session - 03/31/24 1331     Visit Number 8    Number of Visits 16    Date for PT Re-Evaluation 05/02/24    Authorization Type MCR A & B    PT Start Time 1323    PT Stop Time 1402    PT Time Calculation (min) 39 min    Activity Tolerance Patient tolerated treatment well    Behavior During Therapy WFL for tasks assessed/performed                  Past Medical History:  Diagnosis Date   Abnormal electrocardiogram    Abnormal heart sounds    Allergy 2020   upon arrival to [facility]   Anemia 10/04/2023   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   Chronic kidney disease 11/07/2021   Depression 2020   receiving appropriate therapy   Herniated lumbar intervertebral disc    Hypertension    Keratosis    benign   Left bundle branch block    Pacemaker    Pure hypercholesterolemia    Sick sinus syndrome (HCC)    Sleep apnea    VT (ventricular tachycardia) (HCC)    Past Surgical History:  Procedure Laterality Date   CARDIOVERSION N/A 09/28/2020   Procedure: CARDIOVERSION;  Surgeon: Alroy Aspen Lela Purple, MD;  Location: [facility];  Service: Cardiovascular;  Laterality: N/A;   hip replacement Left    INSERT / REPLACE / REMOVE PACEMAKER     JOINT REPLACEMENT     partial right knee, medial side   PPM GENERATOR CHANGEOUT N/A 04/30/2023   Procedure: PPM GENERATOR CHANGEOUT;  Surgeon: Tammie Fall, MD;  Location: [facility] INVAS[facility] CV LAB;  Service: Cardiovascular;  Laterality: N/A;   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   Procedure: LEFT TOTAL HIP ARTHROPLASTY ANTERIOR APPROACH;  Surgeon: Arnie Lao, MD;  Location: [facility];  Service: Orthopedics;  Laterality: Left;   VASECTOMY     Patient Active Problem List   Diagnosis Date Noted   Depression 10/04/2023   Anemia 10/04/2023    Hyperlipidemia 10/04/2023   Abscess of right index finger 02/28/2023   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   Left hip pain 02/01/2022   Environmental and seasonal allergies 02/01/2022   Hypertension 11/07/2021   Chronic kidney disease 11/07/2021   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  He states the injection has calmed the pain down.  Pt states he did well with the gym machines with lighter weight in PT.  Pt denies any adverse effects after prior Rx.  Pt states he can feel it in his L glute also, but typically it has been in his R glute.   PERTINENT HISTORY:  Arthritis of left hip, depression, herniated lumbar IV disc, left BBB, THA left 2023, R partial knee replacement approx 2021 PACEMAKER--NO E-STIM  PAIN:  Are you having pain? Yes: NPRS scale: 3-4/10 Pain location: bilat lower lumbar flanks  Pain description: achy, naggy, sharp at times Aggravating factors: lifting, going up steps Relieving factors: heat, tylenol , back brace  PRECAUTIONS: None  RED FLAGS: None   WEIGHT BEARING RESTRICTIONS: No  FALLS:  Has patient fallen in last 6 months? No  LIVING ENVIRONMENT: Steps on back deck  OCCUPATION: Retired  PLOF: Independent  PATIENT GOALS: get pain dialed down and get  some exercises to strengthen   OBJECTIVE:  Note: Objective measures were completed at Evaluation unless otherwise noted.  DIAGNOSTIC FINDINGS:  1. No acute findings. 2. 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 3. 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 Moderate bilateral neural foraminal narrowing at the L4-5 level right worse than left. Minimal bilateral neural foraminal narrowing at L2-3 and L3-4. 4. Grade 1 anterolisthesis of L4 on L5 due to facet arthropathy.  PATIENT SURVEYS:  Oswestry: fill out next visit  COGNITION: Overall cognitive status: Within functional limits for tasks assessed     SENSATION: No lack of sensation  POSTURE: rounded shoulders and forward head  PALPATION: TTP lateral R glute and R paraspinal L5,S1 region  LUMBAR ROM:   AROM eval  Flexion WFL  Extension WFL  Right lateral flexion WFL  Left lateral flexion Mild pain on the right side   Right rotation   Left rotation    (Blank rows = not tested)  LOWER EXTREMITY ROM:     Passive  Right eval Left eval  Hip flexion 125   Hip extension    Hip abduction    Hip adduction    Hip internal rotation Painful int he right hip    Hip external rotation    Knee flexion    Knee extension    Ankle dorsiflexion    Ankle plantarflexion    Ankle inversion    Ankle eversion     (Blank rows = not tested)  LOWER EXTREMITY MMT:    MMT Right eval Left eval Rt/Lt  Hip flexion 32.5 34.5   Hip extension     Hip abduction 54.0 44.8 41.9/36.5  Hip adduction     Hip internal rotation     Hip external rotation     Knee flexion     Knee extension 59.8 49.5 49.1/51.0  Ankle dorsiflexion     Ankle plantarflexion     Ankle inversion     Ankle eversion      (Blank rows = not tested)  GAIT: Normal stride length, no decreased stance times on either leg  TREATMENT DATE:  5/5 Reviewed pain level, pt presentation, and response to prior Rx.  Nustep L4 UE/LEs x 5 mins   Manual Therapy:  STM to bilat glutes and R sided lumbar paraspinals in prone.   Supine bridging 2x10 PPT approx 15 Seated ball roll outs x 10 reps with 5 sec hold   5/1 STM bil gluts, piriformis Passive quad stretch Supine mod thomas stretch Recumb. Bike   4/28 STM Lt hp Supine piriformis stretch Mod thomas test Upper body gym machines- replaced row and lat pull machine with cable pulleys for core engagement   4/25 Manual: skilled palpation of trigger points; trigger point release to lumbar spine and right gluteal; review of self trigger poitnt release at home. Lumbar PA mobilization L5-L3  There-ex:  LF leg press 20 lbs 3x12  LF hamstring curl 3x12 15 lbs  LF Knee extension 3x12 15 lbs   Ball roll out x10 each direction 5 sec hold   4/22  Therapeutic Exercise: Reviewed PMHx, pain level, and response to prior Rx.  Nustep L4 UE/Les x 5 mins PPT 2x10 Supine bridging 2x10 Bilateral hip abduction seated RTB 2x10  Manual Therapy:  STM to R glute in L S/L'ing with pillow b/w knees and to R glute and bilateral paraspinals in prone.     PATIENT EDUCATION:  Education details: HEP, symptom management, exercise form, rationale of interventions Person educated: Patient Education method: Explanation, Demonstration, Tactile cues, Verbal cues Education comprehension: verbalized understanding and returned demonstration  HOME EXERCISE PROGRAM:  Access Code: [email or web address] URL: [email or web address] Date: 03/07/2024 Prepared by: Lonzell Robin  Exercises - Theracane In Back  - 1 x daily - 7 x weekly - 3 sets - 10 reps - Standing Glute Med Mobilization with Small Ball on Wall  - 1 x daily - 7 x weekly - 3 sets - 10 reps  ASSESSMENT:  CLINICAL IMPRESSION: Pt  reports improved sx's since receiving an injection last week.  PT didn't perform gym exercises today and focused on lumbar mobility and gentle core/glute strength.  PT performed soft tissue work to [employer] and R sided lumbar paraspinals to improve soft tissue tightness/mobility and pain and to reduce myofascial restrictions.  Pt had tenderness in bilat glutes.  He responded well to Rx stating he felt better after Rx having improved tightness.     OBJECTIVE IMPAIRMENTS: decreased mobility.   ACTIVITY LIMITATIONS: lifting, bending, squatting, and stairs  PARTICIPATION LIMITATIONS: community activity and yard work  PERSONAL FACTORS: 1-2 comorbidities: DDD of the spine   are also affecting patient's functional outcome.   REHAB POTENTIAL: Good  CLINICAL DECISION MAKING: Stable/uncomplicated  EVALUATION COMPLEXITY: Low   GOALS: Goals reviewed with patient? Yes  SHORT TERM GOALS: Target date: 03/21/2024   Pt will report decreased morning symptoms within 1 hour of waking up. Baseline: Goal status: achieved  2.  Pt will increase knee extension dynamometer strength by 5lbs. Baseline:  Goal status: ongoing- see objective  3.  Pt will increase hip abduction dynamometer strength by 5lbs Baseline:  Goal status: ongoing- see objective    LONG TERM GOALS: Target date: 04/04/2024  Pt will be able to go up and down 8 stairs without increase in pain Baseline:  Goal status: INITIAL  2.  Pt will increase knee extension dynamometer strength by 10lbs  Baseline:  Goal status: INITIAL  3.  Pt will increase hip abduction dynamometer strength by 10lbs. Baseline:  Goal status: INITIAL    PLAN:  PT FREQUENCY: 1-2x/week  PT DURATION: 4 weeks  PLANNED INTERVENTIONS: 97164- PT Re-evaluation, 97110-Therapeutic exercises, 97530- Therapeutic activity, [id number]- Neuromuscular re-education, 97535- Self Care, [id number]- Manual therapy, [id number]- Gait training, [phone]- Aquatic Therapy, [phone]- Electrical  stimulation (unattended), and [id number]- Electrical stimulation (manual).  PLAN FOR NEXT SESSION: STM to lateral hip and lumbar paraspinals. Consider DN. Add stretches to HEP. Progress to gym activity as tolerated.  Pt has a PACEMAKER--NO E-STIM  Trina Fujita III PT, DPT 04/01/24 7:32 PM

## 2024-03-31 ENCOUNTER — Encounter (HOSPITAL_BASED_OUTPATIENT_CLINIC_OR_DEPARTMENT_OTHER): Payer: Self-pay | Admitting: Physical Therapy

## 2024-03-31 ENCOUNTER — Ambulatory Visit (HOSPITAL_BASED_OUTPATIENT_CLINIC_OR_DEPARTMENT_OTHER): Admitting: Physical Therapy

## 2024-03-31 DIAGNOSIS — M25551 Pain in right hip: Secondary | ICD-10-CM

## 2024-03-31 DIAGNOSIS — R2689 Other abnormalities of gait and mobility: Secondary | ICD-10-CM

## 2024-03-31 DIAGNOSIS — M5459 Other low back pain: Secondary | ICD-10-CM

## 2024-04-02 ENCOUNTER — Ambulatory Visit (HOSPITAL_BASED_OUTPATIENT_CLINIC_OR_DEPARTMENT_OTHER): Admitting: Physical Therapy

## 2024-04-02 ENCOUNTER — Encounter (HOSPITAL_BASED_OUTPATIENT_CLINIC_OR_DEPARTMENT_OTHER): Payer: Self-pay | Admitting: Physical Therapy

## 2024-04-02 DIAGNOSIS — M25551 Pain in right hip: Secondary | ICD-10-CM

## 2024-04-02 DIAGNOSIS — R2689 Other abnormalities of gait and mobility: Secondary | ICD-10-CM

## 2024-04-02 DIAGNOSIS — M5459 Other low back pain: Secondary | ICD-10-CM

## 2024-04-02 NOTE — Therapy (Signed)
 OUTPATIENT PHYSICAL THERAPY THORACOLUMBAR EVALUATION   Patient Name: Jimmy Palmer MRN: [id number] DOB:07/14/1941, 83 y.o., male Today's Date: 04/02/2024  END OF SESSION:  PT End of Session - 04/02/24 1304     Visit Number 9    Number of Visits 16    Date for PT Re-Evaluation 05/02/24    Authorization Type MCR A & B    PT Start Time 1300    PT Stop Time 1343    PT Time Calculation (min) 43 min    Activity Tolerance Patient tolerated treatment well    Behavior During Therapy WFL for tasks assessed/performed                  Past Medical History:  Diagnosis Date   Abnormal electrocardiogram    Abnormal heart sounds    Allergy 2020   upon arrival to [facility]   Anemia 10/04/2023   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   Chronic kidney disease 11/07/2021   Depression 2020   receiving appropriate therapy   Herniated lumbar intervertebral disc    Hypertension    Keratosis    benign   Left bundle branch block    Pacemaker    Pure hypercholesterolemia    Sick sinus syndrome (HCC)    Sleep apnea    VT (ventricular tachycardia) (HCC)    Past Surgical History:  Procedure Laterality Date   CARDIOVERSION N/A 09/28/2020   Procedure: CARDIOVERSION;  Surgeon: Alroy Aspen Lela Purple, MD;  Location: [facility];  Service: Cardiovascular;  Laterality: N/A;   hip replacement Left    INSERT / REPLACE / REMOVE PACEMAKER     JOINT REPLACEMENT     partial right knee, medial side   PPM GENERATOR CHANGEOUT N/A 04/30/2023   Procedure: PPM GENERATOR CHANGEOUT;  Surgeon: Tammie Fall, MD;  Location: [facility];  Service: Cardiovascular;  Laterality: N/A;   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   Procedure: LEFT TOTAL HIP ARTHROPLASTY ANTERIOR APPROACH;  Surgeon: Arnie Lao, MD;  Location: [facility];  Service: Orthopedics;  Laterality: Left;   VASECTOMY     Patient Active Problem List   Diagnosis Date Noted   Depression 10/04/2023   Anemia 10/04/2023    Hyperlipidemia 10/04/2023   Abscess of right index finger 02/28/2023   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   Left hip pain 02/01/2022   Environmental and seasonal allergies 02/01/2022   Hypertension 11/07/2021   Chronic kidney disease 11/07/2021   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   Atrial tachycardia (HCC) 08/26/2020   Pacemaker - MDT 08/26/2020    WUJ:WJXBJ, Milon Aloe, MD    REFERRING PROVIDER: de [place], [name], MD  REFERRING DIAG: Unspecified low back pain  Rationale for Evaluation and Treatment: Rehabilitation  THERAPY DIAG:  Other low back pain  Pain in right hip  Other abnormalities of gait and mobility  ONSET DATE: about 3 weeks ago  SUBJECTIVE:  SUBJECTIVE STATEMENT: Pt is feeling better since the injection last week.  He states the injection has calmed the pain down.  Pt states he did well with the gym machines with lighter weight in PT.  Pt denies any adverse effects after prior Rx.  Pt states he can feel it in his L glute also, but typically it has been in his R glute.   PERTINENT HISTORY:  Arthritis of left hip, depression, herniated lumbar IV disc, left BBB, THA left 2023, R partial knee replacement approx 2021 PACEMAKER--NO E-STIM  PAIN:  Are you having pain? Yes: NPRS scale: 3-4/10 Pain location: bilat lower lumbar flanks  Pain description: achy, naggy, sharp at times Aggravating factors: lifting, going up steps Relieving factors: heat, tylenol , back brace  PRECAUTIONS: None  RED FLAGS: None   WEIGHT BEARING RESTRICTIONS: No  FALLS:  Has patient fallen in last 6 months? No  LIVING ENVIRONMENT: Steps on back deck  OCCUPATION: Retired  PLOF: Independent  PATIENT GOALS: get pain dialed down and get  some exercises to strengthen   OBJECTIVE:  Note: Objective measures were completed at Evaluation unless otherwise noted.  DIAGNOSTIC FINDINGS:  1. No acute findings. 2. 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 3. 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 Moderate bilateral neural foraminal narrowing at the L4-5 level right worse than left. Minimal bilateral neural foraminal narrowing at L2-3 and L3-4. 4. Grade 1 anterolisthesis of L4 on L5 due to facet arthropathy.  PATIENT SURVEYS:  Oswestry: fill out next visit  COGNITION: Overall cognitive status: Within functional limits for tasks assessed     SENSATION: No lack of sensation  POSTURE: rounded shoulders and forward head  PALPATION: TTP lateral R glute and R paraspinal L5,S1 region  LUMBAR ROM:   AROM eval  Flexion WFL  Extension WFL  Right lateral flexion WFL  Left lateral flexion Mild pain on the right side   Right rotation   Left rotation    (Blank rows = not tested)  LOWER EXTREMITY ROM:     Passive  Right eval Left eval  Hip flexion 125   Hip extension    Hip abduction    Hip adduction    Hip internal rotation Painful int he right hip    Hip external rotation    Knee flexion    Knee extension    Ankle dorsiflexion    Ankle plantarflexion    Ankle inversion    Ankle eversion     (Blank rows = not tested)  LOWER EXTREMITY MMT:    MMT Right eval Left eval Rt/Lt  Hip flexion 32.5 34.5   Hip extension     Hip abduction 54.0 44.8 41.9/36.5  Hip adduction     Hip internal rotation     Hip external rotation     Knee flexion     Knee extension 59.8 49.5 49.1/51.0  Ankle dorsiflexion     Ankle plantarflexion     Ankle inversion     Ankle eversion      (Blank rows = not tested)  GAIT: Normal stride length, no decreased stance times on either leg  TREATMENT DATE:  5/7 Manual Therapy:  STM to bilat glutes and R sided lumbar paraspinals in prone.    There-ex:  LF Knee extension 3x12 15 lbs              LF triceps press down 3x10 45 lns             LF Row 3x10 25 lbs 3x10             Railing pushups 3x10                5/5 Reviewed pain level, pt presentation, and response to prior Rx.  Nustep L4 UE/LEs x 5 mins   Manual Therapy:  STM to bilat glutes and R sided lumbar paraspinals in prone.   Supine bridging 2x10 PPT approx 15 Seated ball roll outs x 10 reps with 5 sec hold   5/1 STM bil gluts, piriformis Passive quad stretch Supine mod thomas stretch Recumb. Bike   4/28 STM Lt hp Supine piriformis stretch Mod thomas test Upper body gym machines- replaced row and lat pull machine with cable pulleys for core engagement   4/25 Manual: skilled palpation of trigger points; trigger point release to lumbar spine and right gluteal; review of self trigger poitnt release at home. Lumbar PA mobilization L5-L3  There-ex:  LF leg press 20 lbs 3x12  LF hamstring curl 3x12 15 lbs  LF Knee extension 3x12 15 lbs   Ball roll out x10 each direction 5 sec hold   4/22  Therapeutic Exercise: Reviewed PMHx, pain level, and response to prior Rx.  Nustep L4 UE/Les x 5 mins PPT 2x10 Supine bridging 2x10 Bilateral hip abduction seated RTB 2x10  Manual Therapy:  STM to R glute in L S/L'ing with pillow b/w knees and to R glute and bilateral paraspinals in prone.     PATIENT EDUCATION:  Education details: HEP, symptom management, exercise form, rationale of interventions Person educated: Patient Education method: Explanation, Demonstration, Tactile cues, Verbal cues Education comprehension: verbalized understanding and returned demonstration  HOME EXERCISE PROGRAM:  Access Code: [email or web address] URL:  [email or web address] Date: 03/07/2024 Prepared by: Lonzell Robin  Exercises - Theracane In Back  - 1 x daily - 7 x weekly - 3 sets - 10 reps - Standing Glute Med Mobilization with Small Ball on Wall  - 1 x daily - 7 x weekly - 3 sets - 10 reps  ASSESSMENT:  CLINICAL IMPRESSION: The patient continues to progress well. We were able to advance his exercise weights with several of his exercises. He had increased spasming on his left side today.We will continue to advance him back into a full gym program. He would beneift from further skilled therapy to decrease spasming and strength.  OBJECTIVE IMPAIRMENTS: decreased mobility.   ACTIVITY LIMITATIONS: lifting, bending, squatting, and stairs  PARTICIPATION LIMITATIONS: community activity and yard work  PERSONAL FACTORS: 1-2 comorbidities: DDD of the spine   are also affecting patient's functional outcome.   REHAB POTENTIAL: Good  CLINICAL DECISION MAKING: Stable/uncomplicated  EVALUATION COMPLEXITY: Low   GOALS: Goals reviewed with patient? Yes  SHORT TERM GOALS: Target date: 03/21/2024   Pt will report decreased morning symptoms within 1 hour of waking up. Baseline: Goal status: achieved  2.  Pt will increase knee extension dynamometer strength by 5lbs. Baseline:  Goal status: ongoing- see objective  3.  Pt will increase hip abduction dynamometer strength by 5lbs Baseline:  Goal status: ongoing- see objective    LONG TERM GOALS: Target date: 04/04/2024  Pt will be able to go up and down 8 stairs without increase in pain Baseline:  Goal status: INITIAL  2.  Pt will increase knee extension dynamometer strength by 10lbs  Baseline:  Goal status: INITIAL  3.  Pt will increase hip abduction dynamometer strength by 10lbs. Baseline:  Goal status: INITIAL    PLAN:  PT FREQUENCY: 1-2x/week  PT DURATION: 4 weeks  PLANNED INTERVENTIONS: 97164- PT Re-evaluation, 97110-Therapeutic exercises, 97530-  Therapeutic activity, [id number]- Neuromuscular re-education, 97535- Self Care, 64403- Manual therapy, [id number]- Gait training, [phone]- Aquatic Therapy, [id number]- Electrical stimulation (unattended), and [id number]- Electrical stimulation (manual).  PLAN FOR NEXT SESSION: STM to lateral hip and lumbar paraspinals. Consider DN. Add stretches to HEP. Progress to gym activity as tolerated.  Pt has a PACEMAKER--NO E-STIM   Signa Drier PT DPT 04/02/24 1:39 PM

## 2024-04-03 ENCOUNTER — Ambulatory Visit (INDEPENDENT_AMBULATORY_CARE_PROVIDER_SITE_OTHER): Payer: Medicare Other | Admitting: Family Medicine

## 2024-04-03 ENCOUNTER — Encounter (HOSPITAL_BASED_OUTPATIENT_CLINIC_OR_DEPARTMENT_OTHER): Payer: Self-pay | Admitting: Family Medicine

## 2024-04-03 VITALS — BP 134/78 | HR 85 | Ht 69.0 in | Wt 168.1 lb

## 2024-04-03 DIAGNOSIS — E785 Hyperlipidemia, unspecified: Secondary | ICD-10-CM | POA: Diagnosis not present

## 2024-04-03 DIAGNOSIS — I10 Essential (primary) hypertension: Secondary | ICD-10-CM | POA: Diagnosis not present

## 2024-04-03 DIAGNOSIS — D649 Anemia, unspecified: Secondary | ICD-10-CM | POA: Diagnosis not present

## 2024-04-03 DIAGNOSIS — N1831 Chronic kidney disease, stage 3a: Secondary | ICD-10-CM

## 2024-04-03 NOTE — Patient Instructions (Signed)
  Medication Instructions:  Your physician recommends that you continue on your current medications as directed. Please refer to the Current Medication list given to you today. --If you need a refill on any your medications before your next appointment, please call your pharmacy first. If no refills are authorized on file call the office.-- Lab Work: Your physician has recommended that you have lab work today: 1 week before next visit If you have labs (blood work) drawn today and your tests are completely normal, you will receive your results via MyChart message OR a phone call from our staff.  Please ensure you check your voicemail in the event that you authorized detailed messages to be left on a delegated number. If you have any lab test that is abnormal or we need to change your treatment, we will call you to review the results.    Follow-Up: Your next appointment:   Your physician recommends that you schedule a follow-up appointment in: 6 month follow up  with Dr. de Peru  You will receive a text message or e-mail with a link to a survey about your care and experience with [place] today! We would greatly appreciate your feedback!   Thanks for letting us be apart of your health journey!!  Primary Care and Sports Medicine   Dr. Ceasar Mons Peru   We encourage you to activate your patient portal called "MyChart".  Sign up information is provided on this After Visit Summary.  MyChart is used to connect with patients for Virtual Visits (Telemedicine).  Patients are able to view lab/test results, encounter notes, upcoming appointments, etc.  Non-urgent messages can be sent to your provider as well. To learn more about what you can do with MyChart, please visit --  [email or web address].

## 2024-04-03 NOTE — Progress Notes (Signed)
    Procedures performed today:    None.  Independent interpretation of notes and tests performed by another provider:   None.  Brief History, Exam, Impression, and Recommendations:    BP 134/78 (BP Location: Right Arm, Patient Position: Sitting, Cuff Size: Normal)   Pulse 85   Ht 5\' 9"  (1.753 m)   Wt 168 lb 1.6 oz (76.2 kg)   SpO2 98%   BMI 24.82 kg/m   Anemia, unspecified type Assessment & Plan: Recent labs still with low red blood cell count as well as slightly low hemoglobin.  Despite these being low, they continue to improve from prior labs.  He does not currently have any symptoms related to anemia. Given stability, can continue with intermittent monitoring, no specific interventions needed at this time  Orders: -     CBC with Differential/Platelet; Future  Stage 3a chronic kidney disease (HCC) Assessment & Plan: Recent labs with elevated serum creatinine, but remains stable compared to prior labs. Discussed importance of ensuring appropriate blood pressure control, intermittent monitoring of kidney function.  We will continue to monitor medication regimen and make adjustments as needed based upon any changes in underlying kidney function.  We will plan to check labs around time of next office visit  Orders: -     Comprehensive metabolic panel with GFR; Future  Primary hypertension Assessment & Plan: Blood pressure control adequate in office today Can continue with current regimen of losartan -hydrochlorothiazide  Recommend intermittent monitoring at home, DASH diet  Orders: -     CBC with Differential/Platelet; Future -     Comprehensive metabolic panel with GFR; Future -     Lipid panel; Future  Hyperlipidemia, unspecified hyperlipidemia type Assessment & Plan: Patient continues with simvastatin , denies any issues with medication.  Recent lab panel at goal.  We will continue with current regimen, no changes today.  We will plan to check cholesterol panel shortly  before next appointment  Orders: -     Lipid panel; Future  Return in about 6 months (around 10/04/2024) for hypertension.   ___________________________________________ Elaina Cara de [place], MD, ABFM, CAQSM Primary Care and Sports Medicine [facility]

## 2024-04-04 NOTE — Assessment & Plan Note (Signed)
 Recent labs with elevated serum creatinine, but remains stable compared to prior labs. Discussed importance of ensuring appropriate blood pressure control, intermittent monitoring of kidney function.  We will continue to monitor medication regimen and make adjustments as needed based upon any changes in underlying kidney function.  We will plan to check labs around time of next office visit

## 2024-04-04 NOTE — Assessment & Plan Note (Signed)
 Recent labs still with low red blood cell count as well as slightly low hemoglobin.  Despite these being low, they continue to improve from prior labs.  He does not currently have any symptoms related to anemia. Given stability, can continue with intermittent monitoring, no specific interventions needed at this time

## 2024-04-04 NOTE — Assessment & Plan Note (Signed)
Blood pressure control adequate in office today ?Can continue with current regimen of losartan-hydrochlorothiazide ?Recommend intermittent monitoring at home, DASH diet ?

## 2024-04-04 NOTE — Assessment & Plan Note (Signed)
 Patient continues with simvastatin , denies any issues with medication.  Recent lab panel at goal.  We will continue with current regimen, no changes today.  We will plan to check cholesterol panel shortly before next appointment

## 2024-04-08 ENCOUNTER — Ambulatory Visit (HOSPITAL_BASED_OUTPATIENT_CLINIC_OR_DEPARTMENT_OTHER): Admitting: Physical Therapy

## 2024-04-08 DIAGNOSIS — M5459 Other low back pain: Secondary | ICD-10-CM | POA: Diagnosis not present

## 2024-04-08 DIAGNOSIS — R2689 Other abnormalities of gait and mobility: Secondary | ICD-10-CM

## 2024-04-08 DIAGNOSIS — M25551 Pain in right hip: Secondary | ICD-10-CM

## 2024-04-08 NOTE — Therapy (Signed)
 OUTPATIENT PHYSICAL THERAPY THORACOLUMBAR TREATMENT  Progress Note Reporting Period 03/07/24 to 04/08/24  See note below for Objective Data and Assessment of Progress/Goals.       Patient Name: Jimmy Palmer MRN: [id number] DOB:Jan 20, 1941, 83 y.o., male Today's Date: 04/09/2024  END OF SESSION:  PT End of Session - 04/08/24 1328     Visit Number 10    Number of Visits 16    Date for PT Re-Evaluation 05/02/24    Authorization Type MCR A & B    PT Start Time 1321    PT Stop Time 1410    PT Time Calculation (min) 49 min    Activity Tolerance Patient tolerated treatment well    Behavior During Therapy WFL for tasks assessed/performed                  Past Medical History:  Diagnosis Date   Abnormal electrocardiogram    Abnormal heart sounds    Allergy 2020   upon arrival to [facility]   Anemia 10/04/2023   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   Chronic kidney disease 11/07/2021   Depression 2020   receiving appropriate therapy   Herniated lumbar intervertebral disc    Hypertension    Keratosis    benign   Left bundle branch block    Pacemaker    Pure hypercholesterolemia    Sick sinus syndrome (HCC)    Sleep apnea    VT (ventricular tachycardia) (HCC)    Past Surgical History:  Procedure Laterality Date   CARDIOVERSION N/A 09/28/2020   Procedure: CARDIOVERSION;  Surgeon: Alroy Aspen Lela Purple, MD;  Location: [facility];  Service: Cardiovascular;  Laterality: N/A;   hip replacement Left    INSERT / REPLACE / REMOVE PACEMAKER     JOINT REPLACEMENT     partial right knee, medial side   PPM GENERATOR CHANGEOUT N/A 04/30/2023   Procedure: PPM GENERATOR CHANGEOUT;  Surgeon: Tammie Fall, MD;  Location: [facility];  Service: Cardiovascular;  Laterality: N/A;   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   Procedure: LEFT TOTAL HIP ARTHROPLASTY ANTERIOR APPROACH;  Surgeon: Arnie Lao, MD;  Location: [facility];  Service: Orthopedics;  Laterality:  Left;   VASECTOMY     Patient Active Problem List   Diagnosis Date Noted   Anemia 10/04/2023   Hyperlipidemia 10/04/2023   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   Left hip pain 02/01/2022   Environmental and seasonal allergies 02/01/2022   Hypertension 11/07/2021   Chronic kidney disease 11/07/2021   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  Pt states he can feel his back more today due to the rainy weather.  Pt states he feels better overall.  Pt states he still has stiffness and some pain first thing in AM.  Pt reports improved pain with stairs.  Pt states the manual therapy helps.        PERTINENT HISTORY:  Arthritis of left hip, depression, herniated lumbar IV disc, left BBB, THA left 2023, R partial knee replacement approx 2021 PACEMAKER--NO E-STIM  PAIN:  Are you having pain? Yes: NPRS scale: 4/10 Pain location: bilat lower lumbar flanks  Pain description: achy, naggy, sharp at times Aggravating factors: lifting, going up steps Relieving factors: heat, tylenol , back brace  PRECAUTIONS: None  RED FLAGS: None   WEIGHT BEARING RESTRICTIONS: No  FALLS:  Has patient fallen in last 6 months? No  LIVING ENVIRONMENT: Steps on back deck  OCCUPATION: Retired  PLOF:  Independent  PATIENT GOALS: get pain dialed down and get some exercises to strengthen   OBJECTIVE:  Note: Objective measures were completed at Evaluation unless otherwise noted.  DIAGNOSTIC FINDINGS:  1. No acute findings. 2. 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 3. 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 Moderate bilateral neural foraminal narrowing at the L4-5 level right worse than left. Minimal bilateral neural foraminal narrowing at L2-3 and L3-4. 4. Grade 1 anterolisthesis of L4 on L5 due to facet arthropathy.    COGNITION: Overall cognitive status: Within functional limits for tasks assessed     SENSATION: No lack of sensation  POSTURE: rounded shoulders and forward head  PALPATION: TTP lateral R glute and R paraspinal L5,S1 region  LUMBAR ROM:   AROM eval  Flexion WFL  Extension WFL  Right lateral flexion WFL  Left lateral flexion Mild pain on the right side   Right rotation   Left rotation    (Blank rows = not tested)  LOWER EXTREMITY ROM:     Passive  Right eval Left eval  Hip flexion 125   Hip extension    Hip abduction    Hip adduction    Hip internal rotation Painful int he right hip    Hip external rotation    Knee flexion    Knee extension    Ankle dorsiflexion    Ankle plantarflexion    Ankle inversion    Ankle eversion     (Blank rows = not tested)  LOWER EXTREMITY MMT:    MMT Right eval Left eval Rt/Lt Right 5/13 Left 5/13  Hip flexion 32.5 34.5     Hip extension       Hip abduction 54.0 44.8 41.9/36.5 40.4  37.3  Hip adduction       Hip internal rotation       Hip external rotation       Knee flexion       Knee extension 59.8 49.5 49.1/51.0    Ankle dorsiflexion       Ankle plantarflexion       Ankle inversion       Ankle eversion        (Blank rows = not tested)   TREATMENT DATE:  5/13 Ther Ex: Nustep L5 UE/LEs x 5 mins  Pt ascended and descended a flight of stairs with slightly increased pain though reports not much difficulty.  Assessed hip abd strength.  Supine bridge 2x10 Standing cable rows with TrA 10#x10, 12.5#, 15# x 10 LF knee extension 15# 2x12  Manual Therapy:  STM to bilat glutes and R sided lumbar paraspinals in prone.   Modified Oswestry:  10%  5/7 Manual Therapy:  STM to bilat glutes and R sided lumbar paraspinals in prone.    There-ex:  LF Knee extension 3x12 15 lbs              LF triceps press down 3x10 45 lns             LF Row 3x10 25 lbs 3x10             Railing pushups 3x10                5/5 Reviewed pain level, pt presentation, and response to prior Rx.  Nustep L4 UE/LEs x 5 mins   Manual Therapy:  STM to bilat glutes and R sided lumbar paraspinals in prone.   Supine bridging 2x10 PPT approx 15 Seated ball roll outs x 10 reps with 5 sec hold   5/1 STM bil gluts, piriformis Passive quad stretch Supine mod thomas stretch Recumb. Bike   4/28 STM Lt hp Supine piriformis stretch Mod thomas test Upper body gym machines- replaced row and lat pull machine with cable pulleys for core engagement   4/25 Manual: skilled palpation of trigger points; trigger point release to lumbar spine and right gluteal; review of self trigger poitnt release at home. Lumbar PA mobilization L5-L3  There-ex:  LF leg press 20 lbs 3x12  LF hamstring curl 3x12 15 lbs  LF Knee extension 3x12 15 lbs   Ball roll out x10 each direction 5 sec hold       PATIENT EDUCATION:  Education details: HEP, symptom management, exercise form, rationale of interventions Person educated: Patient Education method: Explanation, Demonstration, Tactile cues, Verbal cues Education comprehension: verbalized understanding and returned  demonstration  HOME EXERCISE PROGRAM:  Access Code: [email or web address] URL: [email or web address] Date: 03/07/2024 Prepared by: Lonzell Robin  Exercises - Theracane In Back  - 1 x daily - 7 x weekly - 3 sets - 10 reps - Standing Glute Med Mobilization with Small Ball on Wall  - 1 x daily - 7 x weekly - 3 sets - 10 reps  ASSESSMENT:  CLINICAL IMPRESSION: Pt is responding well to PT and reports improved pain and sx's.  Pt reports improved performance of stairs.  He tolerates exercises well and reports improved sx's with manual therapy.  PT assessed hip abd strength which has decreased since the initial eval.  PT has been working on pt performing select gym exercises to assist with returning to his gym program.  Pt has met STG #1 and is progressing toward LTG #1.  Pt has some areas of tenderness in bilat glutes more in superior glutes.  He performed exercises well without c/o's.  He had no c/o's and no increased pain after Rx.  Pt should continue to benefit from cont skilled PT to improve pain, soft tissue tightness, core and LE strength, and to assist with returning pt to PLOF.      OBJECTIVE IMPAIRMENTS: decreased mobility.   ACTIVITY LIMITATIONS: lifting, bending, squatting, and stairs  PARTICIPATION LIMITATIONS: community activity and yard work  PERSONAL FACTORS: 1-2 comorbidities: DDD  of the spine  are also affecting patient's functional outcome.   REHAB POTENTIAL: Good  CLINICAL DECISION MAKING: Stable/uncomplicated  EVALUATION COMPLEXITY: Low   GOALS: Goals reviewed with patient? Yes  SHORT TERM GOALS: Target date: 03/21/2024   Pt will report decreased morning symptoms within 1 hour of waking up. Baseline: Goal status: achieved  2.  Pt will increase knee extension dynamometer strength by 5lbs. Baseline:  Goal status: ongoing- see objective  3.  Pt will increase hip abduction dynamometer strength by 5lbs Baseline:  Goal status: NOT MET 5/13    LONG TERM GOALS:  Target date:  05/02/2024  Pt will be able to go up and down 8 stairs without increase in pain Baseline:  Goal status: PROGRESSING  5/13  2.  Pt will increase knee extension dynamometer strength by 10lbs  Baseline:  Goal status: INITIAL  3.  Pt will increase hip abduction dynamometer strength by 10lbs. Baseline:  Goal status: NOT MET  5/13    PLAN:  PT FREQUENCY: 1-2x/week  PT DURATION: 4 weeks  PLANNED INTERVENTIONS: [id number]- PT Re-evaluation, 97110-Therapeutic exercises, 97530- Therapeutic activity, 97112- Neuromuscular re-education, 97535- Self Care, 40981- Manual therapy, [id number]- Gait training, [phone]- Aquatic Therapy, [phone]- Electrical stimulation (unattended), and [id number]- Electrical stimulation (manual).  PLAN FOR NEXT SESSION: STM to lateral hip and lumbar paraspinals. Consider DN. Add stretches to HEP. Progress to gym activity as tolerated.  Pt has a PACEMAKER--NO E-STIM   Trina Fujita III PT, DPT 04/09/24 5:27 PM

## 2024-04-09 ENCOUNTER — Encounter (HOSPITAL_BASED_OUTPATIENT_CLINIC_OR_DEPARTMENT_OTHER): Payer: Self-pay | Admitting: Physical Therapy

## 2024-04-10 ENCOUNTER — Encounter (HOSPITAL_BASED_OUTPATIENT_CLINIC_OR_DEPARTMENT_OTHER): Payer: Self-pay | Admitting: Physical Therapy

## 2024-04-10 ENCOUNTER — Ambulatory Visit (HOSPITAL_BASED_OUTPATIENT_CLINIC_OR_DEPARTMENT_OTHER): Admitting: Physical Therapy

## 2024-04-10 DIAGNOSIS — M5459 Other low back pain: Secondary | ICD-10-CM

## 2024-04-10 DIAGNOSIS — R2689 Other abnormalities of gait and mobility: Secondary | ICD-10-CM

## 2024-04-10 DIAGNOSIS — M25551 Pain in right hip: Secondary | ICD-10-CM

## 2024-04-10 NOTE — Therapy (Signed)
 OUTPATIENT PHYSICAL THERAPY THORACOLUMBAR TREATMENT     Patient Name: Jimmy Palmer MRN: [id number] DOB:August 06, 1941, 83 y.o., male Today's Date: 04/11/2024  END OF SESSION:  PT End of Session - 04/10/24 1320     Visit Number 11    Number of Visits 16    Date for PT Re-Evaluation 05/02/24    Authorization Type MCR A & B    PT Start Time 1316    PT Stop Time 1359    PT Time Calculation (min) 43 min    Activity Tolerance Patient tolerated treatment well    Behavior During Therapy WFL for tasks assessed/performed                  Past Medical History:  Diagnosis Date   Abnormal electrocardiogram    Abnormal heart sounds    Allergy 2020   upon arrival to [facility]   Anemia 10/04/2023   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   Chronic kidney disease 11/07/2021   Depression 2020   receiving appropriate therapy   Herniated lumbar intervertebral disc    Hypertension    Keratosis    benign   Left bundle branch block    Pacemaker    Pure hypercholesterolemia    Sick sinus syndrome (HCC)    Sleep apnea    VT (ventricular tachycardia) (HCC)    Past Surgical History:  Procedure Laterality Date   CARDIOVERSION N/A 09/28/2020   Procedure: CARDIOVERSION;  Surgeon: Alroy Aspen Lela Purple, MD;  Location: [facility];  Service: Cardiovascular;  Laterality: N/A;   hip replacement Left    INSERT / REPLACE / REMOVE PACEMAKER     JOINT REPLACEMENT     partial right knee, medial side   PPM GENERATOR CHANGEOUT N/A 04/30/2023   Procedure: PPM GENERATOR CHANGEOUT;  Surgeon: Tammie Fall, MD;  Location: [facility];  Service: Cardiovascular;  Laterality: N/A;   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   Procedure: LEFT TOTAL HIP ARTHROPLASTY ANTERIOR APPROACH;  Surgeon: Arnie Lao, MD;  Location: [facility];  Service: Orthopedics;  Laterality: Left;   VASECTOMY     Patient Active Problem List   Diagnosis Date Noted   Anemia 10/04/2023   Hyperlipidemia 10/04/2023    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   Left hip pain 02/01/2022   Environmental and seasonal allergies 02/01/2022   Hypertension 11/07/2021   Chronic kidney disease 11/07/2021   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  Pt states he still has stiffness and some pain first thing in AM.  Pt states the manual therapy helps.  Pt states he felt good after prior Rx.        PERTINENT HISTORY:  Arthritis of left hip, depression, herniated lumbar IV disc, left BBB, THA left 2023, R partial knee replacement approx 2021 PACEMAKER--NO E-STIM  PAIN:  Are you having pain? Yes: NPRS scale: 4/10 Pain location: bilat lower lumbar flanks  Pain description: achy, naggy, sharp at times Aggravating factors: lifting, going up steps Relieving factors: heat, tylenol , back brace  PRECAUTIONS: None  RED FLAGS: None   WEIGHT BEARING RESTRICTIONS: No  FALLS:  Has patient fallen in last 6 months? No  LIVING ENVIRONMENT: Steps on back deck  OCCUPATION: Retired  PLOF: Independent  PATIENT GOALS: get pain dialed down and get some exercises to strengthen   OBJECTIVE:  Note: Objective measures were completed at Evaluation unless otherwise noted.  DIAGNOSTIC FINDINGS:  1. No acute findings. 2. Mild-to-moderate  spondylosis throughout the lumbar spine to include facet arthropathy. 3. 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 Moderate bilateral neural foraminal narrowing at the L4-5 level right worse than left. Minimal bilateral neural foraminal narrowing at L2-3 and L3-4. 4. Grade 1 anterolisthesis of L4 on L5 due to facet arthropathy.    COGNITION: Overall cognitive status: Within functional limits for tasks assessed     SENSATION: No lack of sensation  POSTURE: rounded shoulders and forward head  PALPATION: TTP lateral R glute and R paraspinal L5,S1 region  LUMBAR ROM:   AROM eval  Flexion WFL  Extension WFL  Right lateral flexion WFL  Left lateral flexion Mild pain on the right side   Right rotation   Left rotation    (Blank rows = not tested)  LOWER EXTREMITY ROM:     Passive  Right eval Left eval  Hip flexion 125   Hip extension    Hip abduction    Hip adduction    Hip internal rotation Painful int he right hip    Hip external rotation    Knee flexion    Knee extension    Ankle dorsiflexion    Ankle plantarflexion    Ankle inversion    Ankle eversion     (Blank rows = not tested)  LOWER EXTREMITY MMT:    MMT Right eval Left eval Rt/Lt Right 5/13 Left 5/13  Hip flexion 32.5 34.5     Hip extension       Hip abduction 54.0 44.8 41.9/36.5 40.4  37.3  Hip adduction       Hip internal rotation       Hip external rotation       Knee flexion       Knee extension 59.8 49.5 49.1/51.0    Ankle dorsiflexion       Ankle plantarflexion       Ankle inversion       Ankle eversion        (Blank rows = not tested)   TREATMENT DATE:  5/15 Nustep L5 x 5 mins bilat UE/Les  Manual Therapy:  STM to bilat glutes and R sided lumbar paraspinals in prone.  Supine bridge  2x10 PPT approx 10-15  LF triceps press down 3x10 45 lbs LF knee extension 15# 2x12   5/13 Ther Ex: Nustep L5 UE/LEs x 5 mins  Pt ascended and descended a flight of stairs with slightly increased pain though reports not much difficulty.  Assessed hip abd strength.  Supine bridge 2x10 Standing cable rows with TrA 10#x10, 12.5#, 15# x 10 LF knee extension 15# 2x12  Manual Therapy:  STM to bilat glutes and R sided lumbar paraspinals in prone.   Modified Oswestry:  10%  5/7 Manual Therapy:  STM to bilat glutes and R sided lumbar paraspinals in prone.    There-ex:  LF Knee extension 3x12 15 lbs              LF triceps press down 3x10 45 lns             LF Row 3x10 25 lbs 3x10             Railing pushups 3x10                5/5 Reviewed pain level, pt presentation, and response to prior Rx.  Nustep L4 UE/LEs x 5 mins   Manual Therapy:  STM to bilat glutes and R sided lumbar paraspinals in prone.   Supine bridging 2x10 PPT approx 15 Seated ball roll outs x 10 reps with 5 sec hold   5/1 STM bil gluts, piriformis Passive quad stretch Supine mod thomas stretch Recumb. Bike   4/28 STM Lt hp Supine piriformis stretch Mod thomas test Upper body gym machines- replaced row and lat pull machine with cable pulleys for core engagement   4/25 Manual: skilled palpation of trigger points; trigger point release to lumbar spine and right gluteal; review of self trigger poitnt release at home. Lumbar PA mobilization L5-L3  There-ex:  LF leg press 20 lbs 3x12  LF hamstring curl 3x12 15 lbs  LF Knee extension 3x12 15 lbs   Ball roll out x10 each direction 5 sec hold       PATIENT EDUCATION:  Education details: HEP, symptom management, exercise form, rationale of interventions Person educated: Patient Education method: Explanation, Demonstration, Tactile cues, Verbal cues Education comprehension: verbalized understanding and returned demonstration  HOME EXERCISE  PROGRAM:  Access Code: [email or web address] URL: [email or web address] Date: 03/07/2024 Prepared by: Lonzell Robin  Exercises - Theracane In Back  - 1 x daily - 7 x weekly - 3 sets - 10 reps - Standing Glute Med Mobilization with Small Ball on Wall  - 1 x daily - 7 x weekly - 3 sets - 10 reps  ASSESSMENT:  CLINICAL IMPRESSION: Pt is progressing well with pain and sx's and function.  Pt's trigger points and soft tissue tightness are improving.  He has some areas of tenderness in bilat glutes more in superior glutes.  He tolerated exercises well including select gym exercises.  Pt responded well to Rx stating he felt better after Rx.      OBJECTIVE IMPAIRMENTS: decreased mobility.   ACTIVITY LIMITATIONS: lifting, bending, squatting, and stairs  PARTICIPATION LIMITATIONS: community activity and yard work  PERSONAL FACTORS: 1-2 comorbidities: DDD of the spine  are also affecting patient's functional outcome.   REHAB POTENTIAL: Good  CLINICAL DECISION MAKING: Stable/uncomplicated  EVALUATION COMPLEXITY: Low   GOALS: Goals reviewed with patient? Yes  SHORT  TERM GOALS: Target date: 03/21/2024   Pt will report decreased morning symptoms within 1 hour of waking up. Baseline: Goal status: achieved  2.  Pt will increase knee extension dynamometer strength by 5lbs. Baseline:  Goal status: ongoing- see objective  3.  Pt will increase hip abduction dynamometer strength by 5lbs Baseline:  Goal status: NOT MET 5/13    LONG TERM GOALS: Target date:  05/02/2024  Pt will be able to go up and down 8 stairs without increase in pain Baseline:  Goal status: PROGRESSING  5/13  2.  Pt will increase knee extension dynamometer strength by 10lbs  Baseline:  Goal status: INITIAL  3.  Pt will increase hip abduction dynamometer strength by 10lbs. Baseline:  Goal status: NOT MET  5/13    PLAN:  PT FREQUENCY: 1-2x/week  PT DURATION: 4 weeks  PLANNED INTERVENTIONS: 97164- PT  Re-evaluation, 97110-Therapeutic exercises, 97530- Therapeutic activity, 97112- Neuromuscular re-education, 97535- Self Care, 16109- Manual therapy, [id number]- Gait training, [phone]- Aquatic Therapy, [phone]- Electrical stimulation (unattended), and [id number]- Electrical stimulation (manual).  PLAN FOR NEXT SESSION: STM to lateral hip and lumbar paraspinals. Consider DN. Add stretches to HEP. Progress to gym activity as tolerated.  Pt has a PACEMAKER--NO E-STIM   Trina Fujita III PT, DPT 04/11/24 1:37 PM

## 2024-04-15 ENCOUNTER — Ambulatory Visit (HOSPITAL_BASED_OUTPATIENT_CLINIC_OR_DEPARTMENT_OTHER): Admitting: Physical Therapy

## 2024-04-15 DIAGNOSIS — M5459 Other low back pain: Secondary | ICD-10-CM

## 2024-04-15 DIAGNOSIS — M25551 Pain in right hip: Secondary | ICD-10-CM

## 2024-04-15 DIAGNOSIS — R2689 Other abnormalities of gait and mobility: Secondary | ICD-10-CM

## 2024-04-15 NOTE — Therapy (Signed)
 OUTPATIENT PHYSICAL THERAPY THORACOLUMBAR TREATMENT     Patient Name: Jimmy Palmer MRN: [id number] DOB:19-Aug-1941, 84 y.o., male Today's Date: 04/16/2024  END OF SESSION:  PT End of Session - 04/15/24 1402     Visit Number 12    Number of Visits 16    Date for PT Re-Evaluation 05/02/24    Authorization Type MCR A & B    PT Start Time 1316    PT Stop Time 1400    PT Time Calculation (min) 44 min    Activity Tolerance Patient tolerated treatment well    Behavior During Therapy WFL for tasks assessed/performed                   Past Medical History:  Diagnosis Date   Abnormal electrocardiogram    Abnormal heart sounds    Allergy 2020   upon arrival to [facility]   Anemia 10/04/2023   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   Chronic kidney disease 11/07/2021   Depression 2020   receiving appropriate therapy   Herniated lumbar intervertebral disc    Hypertension    Keratosis    benign   Left bundle branch block    Pacemaker    Pure hypercholesterolemia    Sick sinus syndrome (HCC)    Sleep apnea    VT (ventricular tachycardia) (HCC)    Past Surgical History:  Procedure Laterality Date   CARDIOVERSION N/A 09/28/2020   Procedure: CARDIOVERSION;  Surgeon: Alroy Aspen Lela Purple, MD;  Location: [facility];  Service: Cardiovascular;  Laterality: N/A;   hip replacement Left    INSERT / REPLACE / REMOVE PACEMAKER     JOINT REPLACEMENT     partial right knee, medial side   PPM GENERATOR CHANGEOUT N/A 04/30/2023   Procedure: PPM GENERATOR CHANGEOUT;  Surgeon: Tammie Fall, MD;  Location: [facility];  Service: Cardiovascular;  Laterality: N/A;   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   Procedure: LEFT TOTAL HIP ARTHROPLASTY ANTERIOR APPROACH;  Surgeon: Arnie Lao, MD;  Location: [facility];  Service: Orthopedics;  Laterality: Left;   VASECTOMY     Patient Active Problem List   Diagnosis Date Noted   Anemia 10/04/2023   Hyperlipidemia 10/04/2023    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   Left hip pain 02/01/2022   Environmental and seasonal allergies 02/01/2022   Hypertension 11/07/2021   Chronic kidney disease 11/07/2021   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  Pt uses heat which helps.  He also uses voltaren gel which helps.  Pt states he feels it in the same 2 spots as last time.  Pt states he felt better after last Rx.  Pt ordered a theracane.   PERTINENT HISTORY:  Arthritis of left hip, depression, herniated lumbar IV disc, left BBB, THA left 2023, R partial knee replacement approx 2021 PACEMAKER--NO E-STIM  PAIN:  Are you having pain? Yes: NPRS scale: 5/10 Pain location: bilat super glutes  Pain description: achy, naggy, sharp at times Aggravating factors: lifting, going up steps Relieving factors: heat, tylenol , back brace  PRECAUTIONS: None  RED FLAGS: None   WEIGHT BEARING RESTRICTIONS: No  FALLS:  Has patient fallen in last 6 months? No  LIVING ENVIRONMENT: Steps on back deck  OCCUPATION: Retired  PLOF: Independent  PATIENT GOALS: get pain dialed down and get some exercises to strengthen   OBJECTIVE:  Note: Objective measures were completed at Evaluation unless otherwise noted.  DIAGNOSTIC FINDINGS:  1.  No acute findings. 2. 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 3. 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 Moderate bilateral neural foraminal narrowing at the L4-5 level right worse than left. Minimal bilateral neural foraminal narrowing at L2-3 and L3-4. 4. Grade 1 anterolisthesis of L4 on L5 due to facet arthropathy.    COGNITION: Overall cognitive status: Within functional limits for tasks assessed     SENSATION: No lack of sensation  POSTURE: rounded shoulders and forward head  PALPATION: TTP lateral R glute and R paraspinal L5,S1 region  LUMBAR ROM:   AROM eval  Flexion WFL  Extension WFL  Right lateral flexion WFL  Left lateral flexion Mild pain on the right side   Right rotation   Left rotation    (Blank rows = not tested)  LOWER EXTREMITY ROM:     Passive  Right eval Left eval  Hip flexion 125   Hip extension    Hip abduction    Hip adduction    Hip internal rotation Painful int he right hip    Hip external rotation    Knee flexion    Knee extension    Ankle dorsiflexion    Ankle plantarflexion    Ankle inversion    Ankle eversion     (Blank rows = not tested)  LOWER EXTREMITY MMT:    MMT Right eval Left eval Rt/Lt Right 5/13 Left 5/13  Hip flexion 32.5 34.5     Hip extension       Hip abduction 54.0 44.8 41.9/36.5 40.4  37.3  Hip adduction       Hip internal rotation       Hip external rotation       Knee flexion       Knee extension 59.8 49.5 49.1/51.0    Ankle dorsiflexion       Ankle plantarflexion       Ankle inversion       Ankle eversion        (Blank rows = not tested)   TREATMENT DATE:  5/20 Manual Therapy:  STM to bilat glutes and R sided lumbar paraspinals in prone. PT demonstrated  and instructed pt in using the theracane in glutes and lumbar.  Pt used the theracane with instruction form PT.   LF triceps press down 2x10 45 lbs LF knee extension 15# 2x12 LF seated row 25# 3x10 Standing cable shoulder extension with TrA 2x10 10#    5/15 Nustep L5 x 5 mins bilat UE/Les  Manual Therapy:  STM to bilat glutes and R sided lumbar paraspinals in prone.  Supine bridge 2x10 PPT approx 10-15  LF triceps press down 3x10 45 lbs LF knee extension 15# 2x12   5/13 Ther Ex: Nustep L5 UE/LEs x 5 mins  Pt ascended and descended a flight of stairs with slightly increased pain though reports not much difficulty.  Assessed hip abd strength.  Supine bridge 2x10 Standing cable rows with TrA 10#x10, 12.5#, 15# x 10 LF knee extension 15# 2x12  Manual Therapy:  STM to bilat glutes and R sided lumbar paraspinals in prone.   Modified Oswestry:  10%  5/7 Manual Therapy:  STM to bilat glutes and R sided lumbar paraspinals in prone.    There-ex:  LF Knee extension 3x12 15 lbs              LF triceps press down 3x10 45 lns             LF Row 3x10 25 lbs 3x10             Railing pushups 3x10                5/5 Reviewed pain level, pt presentation, and response to prior Rx.  Nustep L4 UE/LEs x 5 mins   Manual Therapy:  STM to bilat glutes and R sided lumbar paraspinals in prone.   Supine bridging 2x10 PPT approx 15 Seated ball roll outs x 10 reps with 5 sec hold   5/1 STM bil gluts, piriformis Passive quad stretch Supine mod thomas stretch Recumb. Bike   4/28 STM Lt hp Supine piriformis stretch Mod thomas test Upper body gym machines- replaced row and lat pull machine with cable pulleys for core engagement   4/25 Manual: skilled palpation of trigger points; trigger point release to lumbar spine and right gluteal; review of self trigger poitnt release at home. Lumbar PA mobilization L5-L3  There-ex:  LF leg press 20 lbs 3x12  LF hamstring curl 3x12 15  lbs  LF Knee extension 3x12 15 lbs   Ball roll out x10 each direction 5 sec hold       PATIENT EDUCATION:  Education details: HEP, symptom management, exercise form, rationale of interventions Person educated: Patient Education method: Explanation, Demonstration, Tactile cues, Verbal cues Education comprehension: verbalized understanding and returned demonstration  HOME EXERCISE PROGRAM:  Access Code: [email or web address] URL: [email or web address] Date: 03/07/2024 Prepared by: Lonzell Robin  Exercises - Theracane In Back  - 1 x daily - 7 x weekly - 3 sets - 10 reps - Standing Glute Med Mobilization with Small Ball on Wall  - 1 x daily - 7 x weekly - 3 sets - 10 reps  ASSESSMENT:  CLINICAL IMPRESSION: Pt's trigger points and soft tissue tightness are improving.  Pt had reduced tenderness with STM today.  PT has ordered a theracane.  PT demonstrated and instructed pt in using the theracane in glutes and lumbar today.  Pt does require cuing and instruction in using the theracane.  He tolerated gym exercises  well.  Pt is very appreciative of PT.  Pt responded well to Rx having no c/o's after Rx.    OBJECTIVE IMPAIRMENTS: decreased mobility.   ACTIVITY LIMITATIONS: lifting, bending, squatting, and stairs  PARTICIPATION LIMITATIONS: community activity and yard work  PERSONAL FACTORS: 1-2 comorbidities: DDD of the spine  are also affecting patient's functional outcome.   REHAB POTENTIAL: Good  CLINICAL DECISION MAKING: Stable/uncomplicated  EVALUATION COMPLEXITY: Low   GOALS: Goals reviewed with patient? Yes  SHORT TERM GOALS: Target date: 03/21/2024   Pt will report decreased morning symptoms within 1 hour of waking up. Baseline: Goal status: achieved  2.  Pt will increase knee extension dynamometer strength by 5lbs. Baseline:  Goal status: ongoing- see objective  3.  Pt will increase hip abduction dynamometer strength by 5lbs Baseline:  Goal status: NOT MET  5/13    LONG TERM GOALS: Target date:  05/02/2024  Pt will be able to go up and down 8 stairs without increase in pain Baseline:  Goal status: PROGRESSING  5/13  2.  Pt will increase knee extension dynamometer strength by 10lbs  Baseline:  Goal status: INITIAL  3.  Pt will increase hip abduction dynamometer strength by 10lbs. Baseline:  Goal status: NOT MET  5/13    PLAN:  PT FREQUENCY: 1-2x/week  PT DURATION: 4 weeks  PLANNED INTERVENTIONS: 97164- PT Re-evaluation, 97110-Therapeutic exercises, 97530- Therapeutic activity, 97112- Neuromuscular re-education, 97535- Self Care, 09811- Manual therapy, [id number]- Gait training, [phone]- Aquatic Therapy, [phone]- Electrical stimulation (unattended), and [id number]- Electrical stimulation (manual).  PLAN FOR NEXT SESSION: STM to lateral hip and lumbar paraspinals. Consider DN. Add stretches to HEP. Progress to gym activity as tolerated.  Pt has a PACEMAKER--NO E-STIM   Trina Fujita III PT, DPT 04/17/24 6:55 AM

## 2024-04-16 ENCOUNTER — Encounter (HOSPITAL_BASED_OUTPATIENT_CLINIC_OR_DEPARTMENT_OTHER): Payer: Self-pay | Admitting: Physical Therapy

## 2024-04-17 ENCOUNTER — Ambulatory Visit (HOSPITAL_BASED_OUTPATIENT_CLINIC_OR_DEPARTMENT_OTHER): Admitting: Physical Therapy

## 2024-04-17 DIAGNOSIS — M5459 Other low back pain: Secondary | ICD-10-CM

## 2024-04-17 DIAGNOSIS — M25551 Pain in right hip: Secondary | ICD-10-CM

## 2024-04-17 DIAGNOSIS — M25552 Pain in left hip: Secondary | ICD-10-CM

## 2024-04-17 DIAGNOSIS — Z96642 Presence of left artificial hip joint: Secondary | ICD-10-CM

## 2024-04-17 DIAGNOSIS — R2689 Other abnormalities of gait and mobility: Secondary | ICD-10-CM

## 2024-04-17 NOTE — Therapy (Signed)
 OUTPATIENT PHYSICAL THERAPY THORACOLUMBAR TREATMENT     Patient Name: Jimmy Palmer MRN: [id number] DOB:06/11/1941, 83 y.o., male Today's Date: 04/18/2024  END OF SESSION:  PT End of Session - 04/18/24 0856     Visit Number 13    Number of Visits 16    Date for PT Re-Evaluation 05/02/24    Authorization Type MCR A & B    PT Start Time 0845    PT Stop Time 0928    PT Time Calculation (min) 43 min    Activity Tolerance Patient tolerated treatment well    Behavior During Therapy [facility] for tasks assessed/performed                    Past Medical History:  Diagnosis Date   Abnormal electrocardiogram    Abnormal heart sounds    Allergy 2020   upon arrival to [facility]   Anemia 10/04/2023   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   Chronic kidney disease 11/07/2021   Depression 2020   receiving appropriate therapy   Herniated lumbar intervertebral disc    Hypertension    Keratosis    benign   Left bundle branch block    Pacemaker    Pure hypercholesterolemia    Sick sinus syndrome (HCC)    Sleep apnea    VT (ventricular tachycardia) (HCC)    Past Surgical History:  Procedure Laterality Date   CARDIOVERSION N/A 09/28/2020   Procedure: CARDIOVERSION;  Surgeon: Alroy Aspen Lela Purple, MD;  Location: [facility];  Service: Cardiovascular;  Laterality: N/A;   hip replacement Left    INSERT / REPLACE / REMOVE PACEMAKER     JOINT REPLACEMENT     partial right knee, medial side   PPM GENERATOR CHANGEOUT N/A 04/30/2023   Procedure: PPM GENERATOR CHANGEOUT;  Surgeon: Tammie Fall, MD;  Location: [facility];  Service: Cardiovascular;  Laterality: N/A;   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   Procedure: LEFT TOTAL HIP ARTHROPLASTY ANTERIOR APPROACH;  Surgeon: Arnie Lao, MD;  Location: [facility];  Service: Orthopedics;  Laterality: Left;   VASECTOMY     Patient Active Problem List   Diagnosis Date Noted   Anemia 10/04/2023   Hyperlipidemia  10/04/2023   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  ONSET DATE: about 3 weeks ago  SUBJECTIVE:  SUBJECTIVE STATEMENT: Patient reports gluteal pain on both sides today. He does feel like he is getting better.   PERTINENT HISTORY:  Arthritis of left hip, depression, herniated lumbar IV disc, left BBB, THA left 2023, R partial knee replacement approx 2021 PACEMAKER--NO E-STIM  PAIN:  Are you having pain? Yes: NPRS scale: 5/10 Pain location: bilat super glutes  Pain description: achy, naggy, sharp at times Aggravating factors: lifting, going up steps Relieving factors: heat, tylenol , back brace  PRECAUTIONS: None  RED FLAGS: None   WEIGHT BEARING RESTRICTIONS: No  FALLS:  Has patient fallen in last 6 months? No  LIVING ENVIRONMENT: Steps on back deck  OCCUPATION: Retired  PLOF: Independent  PATIENT GOALS: get pain dialed down and get some exercises to strengthen   OBJECTIVE:  Note: Objective measures were completed at Evaluation unless otherwise noted.  DIAGNOSTIC FINDINGS:  1. No acute findings. 2. 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 3. Moderate spinal canal stenosis at L4-5 and minimal canal stenosis at L3-4 which is  multifactorial as described. Moderate bilateral neural foraminal narrowing at the L4-5 level right worse than left. Minimal bilateral neural foraminal narrowing at L2-3 and L3-4. 4. Grade 1 anterolisthesis of L4 on L5 due to facet arthropathy.    COGNITION: Overall cognitive status: Within functional limits for tasks assessed     SENSATION: No lack of sensation  POSTURE: rounded shoulders and forward head  PALPATION: TTP lateral R glute and R paraspinal L5,S1 region  LUMBAR ROM:   AROM eval  Flexion WFL  Extension WFL  Right lateral flexion WFL  Left lateral flexion Mild pain on the right side   Right rotation   Left rotation    (Blank rows = not tested)  LOWER EXTREMITY ROM:     Passive  Right eval Left eval  Hip flexion 125   Hip extension    Hip abduction    Hip adduction    Hip internal rotation Painful int he right hip    Hip external rotation    Knee flexion    Knee extension    Ankle dorsiflexion    Ankle plantarflexion    Ankle inversion    Ankle eversion     (Blank rows = not tested)  LOWER EXTREMITY MMT:    MMT Right eval Left eval Rt/Lt Right 5/13 Left 5/13  Hip flexion 32.5 34.5     Hip extension       Hip abduction 54.0 44.8 41.9/36.5 40.4  37.3  Hip adduction       Hip internal rotation       Hip external rotation       Knee flexion       Knee extension 59.8 49.5 49.1/51.0    Ankle dorsiflexion       Ankle plantarflexion       Ankle inversion       Ankle eversion        (Blank rows = not tested)   TREATMENT DATE:  5/22 Manual Therapy:  STM to bilat glutes and R sided lumbar paraspinals in prone. PT demonstrated and instructed pt in using the theracane in glutes and lumbar.  Pt used the theracane with instruction form PT.    There-ex LF triceps press down 2x10 45 lbs LF knee extension 15# 2x12 LF seated  row 30# 3x10 LF hip abduction 45 lbs 3x10   Bicep curls with posture 5 lbs    5/20 Manual Therapy:  STM to bilat glutes and R sided lumbar paraspinals in prone. PT demonstrated and instructed pt in using the theracane in glutes and lumbar.  Pt used the theracane with instruction form PT.   LF triceps press down 2x10 45 lbs LF knee extension 15# 2x12 LF seated row 25# 3x10 Standing cable shoulder extension with TrA 2x10 10#    5/15 Nustep L5 x 5 mins bilat UE/Les  Manual Therapy:  STM to bilat glutes and R sided lumbar paraspinals in prone.  Supine bridge 2x10 PPT approx 10-15  LF triceps press down 3x10 45 lbs LF knee extension 15# 2x12   5/13 Ther Ex: Nustep L5 UE/LEs x 5 mins  Pt ascended and descended a flight of stairs with slightly increased pain though reports not much difficulty.  Assessed hip abd strength.  Supine bridge 2x10 Standing cable rows with TrA 10#x10, 12.5#, 15# x 10 LF knee extension 15# 2x12  Manual Therapy:  STM to bilat glutes and R sided lumbar paraspinals in prone.   Modified Oswestry:  10%  5/7 Manual Therapy:  STM to bilat glutes and R sided lumbar paraspinals in prone.    There-ex:  LF Knee extension 3x12 15 lbs              LF triceps press down 3x10 45 lns             LF Row 3x10 25 lbs 3x10             Railing pushups 3x10                5/5 Reviewed pain level, pt presentation, and response to prior Rx.  Nustep L4 UE/LEs x 5 mins   Manual Therapy:  STM to bilat glutes and R sided lumbar paraspinals in prone.   Supine bridging 2x10 PPT approx 15 Seated ball roll outs x 10 reps with 5 sec hold   5/1 STM bil gluts, piriformis Passive quad stretch Supine mod thomas stretch Recumb. Bike   4/28 STM Lt hp Supine piriformis stretch Mod thomas test Upper body gym machines- replaced row and lat pull machine with cable pulleys for core engagement   4/25 Manual: skilled palpation of trigger points; trigger point  release to lumbar spine and right gluteal; review of self trigger poitnt release at home. Lumbar PA mobilization L5-L3  There-ex:  LF leg press 20 lbs 3x12  LF hamstring curl 3x12 15 lbs  LF Knee extension 3x12 15 lbs   Ball roll out x10 each direction 5 sec hold       PATIENT EDUCATION:  Education details: HEP, symptom management, exercise form, rationale of interventions Person educated: Patient Education method: Explanation, Demonstration, Tactile cues, Verbal cues Education comprehension: verbalized understanding and returned demonstration  HOME EXERCISE PROGRAM:  Access Code: [email or web address] URL: [email or web address] Date: 03/07/2024 Prepared by: Lonzell Robin  Exercises - Theracane In Back  - 1 x daily - 7 x weekly - 3 sets - 10 reps - Standing Glute Med Mobilization with Small Ball on  Wall  - 1 x daily - 7 x weekly - 3 sets - 10 reps  ASSESSMENT:  CLINICAL IMPRESSION: Therapy continues to wean patient back into his gym program. He tolerated well. He had no significant increase in pain. He continues to have trigger points in his gluteal. He declined TPDN. Therapy will continue to progress as tolerated.    OBJECTIVE IMPAIRMENTS: decreased mobility.   ACTIVITY LIMITATIONS: lifting, bending, squatting, and stairs  PARTICIPATION LIMITATIONS: community activity and yard work  PERSONAL FACTORS: 1-2 comorbidities: DDD of the spine  are also affecting patient's functional outcome.   REHAB POTENTIAL: Good  CLINICAL DECISION MAKING: Stable/uncomplicated  EVALUATION COMPLEXITY: Low   GOALS: Goals reviewed with patient? Yes  SHORT TERM GOALS: Target date: 03/21/2024   Pt will report decreased morning symptoms within 1 hour of waking up. Baseline: Goal status: achieved  2.  Pt will increase knee extension dynamometer strength by 5lbs. Baseline:  Goal status: ongoing- see objective  3.  Pt will increase hip abduction dynamometer strength by 5lbs Baseline:   Goal status: NOT MET 5/13    LONG TERM GOALS: Target date:  05/02/2024  Pt will be able to go up and down 8 stairs without increase in pain Baseline:  Goal status: PROGRESSING  5/13  2.  Pt will increase knee extension dynamometer strength by 10lbs  Baseline:  Goal status: INITIAL  3.  Pt will increase hip abduction dynamometer strength by 10lbs. Baseline:  Goal status: NOT MET  5/13    PLAN:  PT FREQUENCY: 1-2x/week  PT DURATION: 4 weeks  PLANNED INTERVENTIONS: 97164- PT Re-evaluation, 97110-Therapeutic exercises, 97530- Therapeutic activity, 97112- Neuromuscular re-education, 97535- Self Care, 16109- Manual therapy, [id number]- Gait training, [phone]- Aquatic Therapy, [phone]- Electrical stimulation (unattended), and [id number]- Electrical stimulation (manual).  PLAN FOR NEXT SESSION: STM to lateral hip and lumbar paraspinals. Consider DN. Add stretches to HEP. Progress to gym activity as tolerated.  Pt has a PACEMAKER--NO E-STIM   Signa Drier PT DPT 04/18/24 10:37 AM

## 2024-04-18 ENCOUNTER — Encounter (HOSPITAL_BASED_OUTPATIENT_CLINIC_OR_DEPARTMENT_OTHER): Payer: Self-pay | Admitting: Physical Therapy

## 2024-04-23 ENCOUNTER — Ambulatory Visit (HOSPITAL_BASED_OUTPATIENT_CLINIC_OR_DEPARTMENT_OTHER): Admitting: Physical Therapy

## 2024-04-25 ENCOUNTER — Ambulatory Visit (HOSPITAL_BASED_OUTPATIENT_CLINIC_OR_DEPARTMENT_OTHER): Admitting: Physical Therapy

## 2024-04-25 DIAGNOSIS — R2689 Other abnormalities of gait and mobility: Secondary | ICD-10-CM

## 2024-04-25 DIAGNOSIS — M5459 Other low back pain: Secondary | ICD-10-CM | POA: Diagnosis not present

## 2024-04-25 DIAGNOSIS — M25551 Pain in right hip: Secondary | ICD-10-CM

## 2024-04-25 NOTE — Therapy (Signed)
 OUTPATIENT PHYSICAL THERAPY THORACOLUMBAR TREATMENT     Patient Name: SAMIR ISHAQ MRN: [id number] DOB:11-27-1941, 83 y.o., male Today's Date: 04/25/2024  END OF SESSION:  PT End of Session - 04/25/24 1507     Visit Number 14    Number of Visits 16    Date for PT Re-Evaluation 05/02/24    Authorization Type MCR A & B    PT Start Time 1345    PT Stop Time 1426    PT Time Calculation (min) 41 min    Activity Tolerance Patient tolerated treatment well    Behavior During Therapy WFL for tasks assessed/performed                     Past Medical History:  Diagnosis Date   Abnormal electrocardiogram    Abnormal heart sounds    Allergy 2020   upon arrival to [facility]   Anemia 10/04/2023   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   Chronic kidney disease 11/07/2021   Depression 2020   receiving appropriate therapy   Herniated lumbar intervertebral disc    Hypertension    Keratosis    benign   Left bundle branch block    Pacemaker    Pure hypercholesterolemia    Sick sinus syndrome (HCC)    Sleep apnea    VT (ventricular tachycardia) (HCC)    Past Surgical History:  Procedure Laterality Date   CARDIOVERSION N/A 09/28/2020   Procedure: CARDIOVERSION;  Surgeon: Alroy Aspen Lela Purple, MD;  Location: [facility];  Service: Cardiovascular;  Laterality: N/A;   hip replacement Left    INSERT / REPLACE / REMOVE PACEMAKER     JOINT REPLACEMENT     partial right knee, medial side   PPM GENERATOR CHANGEOUT N/A 04/30/2023   Procedure: PPM GENERATOR CHANGEOUT;  Surgeon: Tammie Fall, MD;  Location: [facility];  Service: Cardiovascular;  Laterality: N/A;   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   Procedure: LEFT TOTAL HIP ARTHROPLASTY ANTERIOR APPROACH;  Surgeon: Arnie Lao, MD;  Location: [facility];  Service: Orthopedics;  Laterality: Left;   VASECTOMY     Patient Active Problem List   Diagnosis Date Noted   Anemia 10/04/2023   Hyperlipidemia  10/04/2023   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   Left hip pain 02/01/2022   Environmental and seasonal allergies 02/01/2022   Hypertension 11/07/2021   Chronic kidney disease 11/07/2021   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 He feels like the manual therapy was too aggressive. He was still like to do it but lighter.  PERTINENT HISTORY:  Arthritis of left hip, depression, herniated lumbar IV disc, left BBB, THA left 2023, R partial knee replacement approx 2021 PACEMAKER--NO E-STIM  PAIN:  Are you having pain? Yes: NPRS scale: 5/10 Pain location: bilat super glutes  Pain description: achy, naggy, sharp at times Aggravating factors: lifting, going up steps Relieving factors: heat, tylenol , back brace  PRECAUTIONS: None  RED FLAGS: None   WEIGHT BEARING RESTRICTIONS: No  FALLS:  Has patient fallen in last 6 months? No  LIVING ENVIRONMENT: Steps on back deck  OCCUPATION: Retired  PLOF: Independent  PATIENT GOALS: get pain dialed down and get some exercises to strengthen   OBJECTIVE:  Note: Objective measures were completed at Evaluation unless otherwise noted.  DIAGNOSTIC FINDINGS:  1. No acute findings. 2. Mild-to-moderate spondylosis  throughout the lumbar spine to include facet arthropathy. 3. 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 Moderate bilateral neural foraminal narrowing at the L4-5 level right worse than left. Minimal bilateral neural foraminal narrowing at L2-3 and L3-4. 4. Grade 1 anterolisthesis of L4 on L5 due to facet arthropathy.    COGNITION: Overall cognitive status: Within functional limits for tasks assessed     SENSATION: No lack of sensation  POSTURE: rounded shoulders and forward head  PALPATION: TTP lateral R glute and R paraspinal L5,S1 region  LUMBAR ROM:   AROM eval  Flexion WFL  Extension WFL  Right lateral flexion WFL  Left lateral flexion Mild pain on the right side   Right rotation   Left rotation    (Blank rows = not tested)  LOWER EXTREMITY ROM:     Passive  Right eval Left eval  Hip flexion 125   Hip extension    Hip abduction    Hip adduction    Hip internal rotation Painful int he right hip    Hip external rotation    Knee flexion    Knee extension    Ankle dorsiflexion    Ankle plantarflexion    Ankle inversion    Ankle eversion     (Blank rows = not tested)  LOWER EXTREMITY MMT:    MMT Right eval Left eval Rt/Lt Right 5/13 Left 5/13  Hip flexion 32.5 34.5     Hip extension       Hip abduction 54.0 44.8 41.9/36.5 40.4  37.3  Hip adduction       Hip internal rotation       Hip external rotation       Knee flexion       Knee extension 59.8 49.5 49.1/51.0    Ankle dorsiflexion       Ankle plantarflexion       Ankle inversion       Ankle eversion        (Blank rows = not tested)   TREATMENT DATE:  5/30 Manual Therapy:  STM to bilat glutes and R sided lumbar paraspinals in prone. LAD to right ( hip replacement on left so not performed)    There-ex Leg press cybex 70 lbs LF triceps press down 2x10 45 lbs Nu-step 5 min reviewed set up for independent use in the gym   5/22 Manual Therapy:  STM to bilat glutes and R sided lumbar paraspinals in prone. PT demonstrated and instructed pt in using the theracane in glutes and lumbar.  Pt used the theracane with instruction form PT.    There-ex LF triceps press down 2x10 45 lbs LF knee extension 15# 2x12 LF seated row 30# 3x10 LF hip abduction 45 lbs 3x10   Bicep curls with posture 5 lbs    5/20 Manual Therapy:  STM to bilat glutes and R sided lumbar paraspinals in prone. PT demonstrated and instructed pt in using the theracane in glutes and lumbar.  Pt used the theracane with instruction form PT.   LF triceps press down 2x10 45 lbs LF knee extension 15# 2x12 LF seated row 25# 3x10 Standing cable shoulder extension with TrA 2x10 10#    5/15 Nustep L5 x 5 mins bilat UE/Les  Manual Therapy:  STM to bilat glutes and R sided lumbar paraspinals in prone.  Supine bridge 2x10 PPT approx 10-15  LF triceps press down 3x10 45 lbs LF knee extension 15# 2x12   5/13 Ther Ex: Nustep L5 UE/LEs x 5 mins  Pt ascended and descended a flight of stairs with slightly increased pain though reports not much difficulty.  Assessed hip abd strength.  Supine bridge 2x10 Standing cable rows with TrA 10#x10, 12.5#, 15# x 10 LF knee extension 15# 2x12  Manual Therapy:  STM to bilat glutes and R sided lumbar paraspinals in prone.   Modified Oswestry:  10%  5/7 Manual Therapy:  STM to bilat glutes and R sided lumbar paraspinals in prone.    There-ex:  LF Knee extension 3x12 15 lbs              LF triceps press down 3x10 45 lns             LF Row 3x10 25 lbs 3x10             Railing pushups 3x10                5/5 Reviewed pain level, pt presentation, and response to prior Rx.  Nustep L4 UE/LEs x 5 mins   Manual Therapy:  STM to bilat glutes and R sided lumbar  paraspinals in prone.   Supine bridging 2x10 PPT approx 15 Seated ball roll outs x 10 reps with 5 sec hold   5/1 STM bil gluts, piriformis Passive quad stretch Supine mod thomas stretch Recumb. Bike   4/28 STM Lt hp Supine piriformis stretch Mod thomas test Upper body gym machines- replaced row and lat pull machine with cable pulleys for core engagement   4/25 Manual: skilled palpation of trigger points; trigger point release to lumbar spine and right gluteal; review of self trigger poitnt release at home. Lumbar PA mobilization L5-L3  There-ex:  LF leg press 20 lbs 3x12  LF hamstring curl 3x12 15 lbs  LF Knee extension 3x12 15 lbs   Ball roll out x10 each direction 5 sec hold       PATIENT EDUCATION:  Education details: HEP, symptom management, exercise form, rationale of interventions Person educated: Patient Education method: Explanation, Demonstration, Tactile cues,  Verbal cues Education comprehension: verbalized understanding and returned demonstration  HOME EXERCISE PROGRAM:  Access Code: [email or web address] URL: [email or web address] Date: 03/07/2024 Prepared by: Lonzell Robin  Exercises - Theracane In Back  - 1 x daily - 7 x weekly - 3 sets - 10 reps - Standing Glute Med Mobilization with Small Ball on Wall  - 1 x daily - 7 x weekly - 3 sets - 10 reps  ASSESSMENT:  CLINICAL IMPRESSION: Therapy went lighter with manual therapy. He reported feeling better after LAD. We continue to advance his gym exercises.  He is making good progress. We will continue to expand his gym program.   OBJECTIVE IMPAIRMENTS: decreased mobility.   ACTIVITY LIMITATIONS: lifting, bending, squatting, and stairs  PARTICIPATION LIMITATIONS: community activity and yard work  PERSONAL FACTORS: 1-2 comorbidities: DDD of the spine  are also affecting patient's functional outcome.   REHAB POTENTIAL: Good  CLINICAL DECISION MAKING: Stable/uncomplicated  EVALUATION COMPLEXITY:  Low   GOALS: Goals reviewed with patient? Yes  SHORT TERM GOALS: Target date: 03/21/2024   Pt will report decreased morning symptoms within 1 hour of waking up. Baseline: Goal status: achieved  2.  Pt will increase knee extension dynamometer strength by 5lbs. Baseline:  Goal status: ongoing- see objective  3.  Pt will increase hip abduction dynamometer strength by 5lbs Baseline:  Goal status: NOT MET 5/13    LONG TERM GOALS: Target date:  05/02/2024  Pt will be able to go up and down 8 stairs without increase in pain Baseline:  Goal status: PROGRESSING  5/13  2.  Pt will increase knee extension dynamometer strength by 10lbs  Baseline:  Goal status: INITIAL  3.  Pt will increase hip abduction dynamometer strength by 10lbs. Baseline:  Goal status: NOT MET  5/13    PLAN:  PT FREQUENCY: 1-2x/week  PT DURATION: 4 weeks  PLANNED INTERVENTIONS: 97164- PT Re-evaluation, 97110-Therapeutic exercises, 97530- Therapeutic activity, 97112- Neuromuscular re-education, 97535- Self Care, 54098- Manual therapy, [id number]- Gait training, [phone]- Aquatic Therapy, [phone]- Electrical stimulation (unattended), and [id number]- Electrical stimulation (manual).  PLAN FOR NEXT SESSION: STM to lateral hip and lumbar paraspinals. Consider DN. Add stretches to HEP. Progress to gym activity as tolerated.  Pt has a PACEMAKER--NO E-STIM   Signa Drier PT DPT 04/25/24 4:27 PM

## 2024-04-29 ENCOUNTER — Ambulatory Visit (INDEPENDENT_AMBULATORY_CARE_PROVIDER_SITE_OTHER): Payer: Medicare Other

## 2024-04-29 DIAGNOSIS — I442 Atrioventricular block, complete: Secondary | ICD-10-CM

## 2024-04-29 LAB — CUP PACEART REMOTE DEVICE CHECK
Battery Remaining Longevity: 83 mo
Battery Voltage: 3 V
Brady Statistic AP VP Percent: 25.98 %
Brady Statistic AP VS Percent: 0.02 %
Brady Statistic AS VP Percent: 73.94 %
Brady Statistic AS VS Percent: 0.06 %
Brady Statistic RA Percent Paced: 25.62 %
Brady Statistic RV Percent Paced: 99.92 %
Date Time Interrogation Session: 20250603014754
Implantable Lead Connection Status: 753985
Implantable Lead Connection Status: 753985
Implantable Lead Implant Date: 20050208
Implantable Lead Implant Date: 20050208
Implantable Lead Location: 753859
Implantable Lead Location: 753860
Implantable Lead Model: 5076
Implantable Lead Model: 5076
Implantable Pulse Generator Implant Date: 20240603
Lead Channel Impedance Value: 266 Ohm
Lead Channel Impedance Value: 342 Ohm
Lead Channel Impedance Value: 361 Ohm
Lead Channel Impedance Value: 418 Ohm
Lead Channel Pacing Threshold Amplitude: 1.125 V
Lead Channel Pacing Threshold Amplitude: 1.625 V
Lead Channel Pacing Threshold Pulse Width: 0.4 ms
Lead Channel Pacing Threshold Pulse Width: 0.4 ms
Lead Channel Sensing Intrinsic Amplitude: 2.625 mV
Lead Channel Sensing Intrinsic Amplitude: 2.625 mV
Lead Channel Sensing Intrinsic Amplitude: 7.75 mV
Lead Channel Sensing Intrinsic Amplitude: 7.75 mV
Lead Channel Setting Pacing Amplitude: 2.75 V
Lead Channel Setting Pacing Amplitude: 3 V
Lead Channel Setting Pacing Pulse Width: 0.6 ms
Lead Channel Setting Sensing Sensitivity: 4 mV
Zone Setting Status: 755011

## 2024-05-05 ENCOUNTER — Encounter (HOSPITAL_BASED_OUTPATIENT_CLINIC_OR_DEPARTMENT_OTHER): Payer: Self-pay | Admitting: Physical Therapy

## 2024-05-07 ENCOUNTER — Ambulatory Visit (HOSPITAL_BASED_OUTPATIENT_CLINIC_OR_DEPARTMENT_OTHER): Attending: Family Medicine | Admitting: Physical Therapy

## 2024-05-07 ENCOUNTER — Encounter (HOSPITAL_BASED_OUTPATIENT_CLINIC_OR_DEPARTMENT_OTHER): Payer: Self-pay | Admitting: Physical Therapy

## 2024-05-07 ENCOUNTER — Other Ambulatory Visit (HOSPITAL_BASED_OUTPATIENT_CLINIC_OR_DEPARTMENT_OTHER): Payer: Self-pay

## 2024-05-07 DIAGNOSIS — M5459 Other low back pain: Secondary | ICD-10-CM | POA: Diagnosis present

## 2024-05-07 DIAGNOSIS — M25551 Pain in right hip: Secondary | ICD-10-CM | POA: Diagnosis present

## 2024-05-07 DIAGNOSIS — R2689 Other abnormalities of gait and mobility: Secondary | ICD-10-CM | POA: Insufficient documentation

## 2024-05-07 NOTE — Therapy (Signed)
 OUTPATIENT PHYSICAL THERAPY THORACOLUMBAR TREATMENT     Patient Name: Jimmy Palmer MRN: [id number] DOB:[birth year], 83 y.o., male Today's Date: 05/08/2024  END OF SESSION:  PT End of Session - 05/07/24 1628     Visit Number 15    Number of Visits 23    Date for PT Re-Evaluation 07/02/24    Authorization Type MCR A & B    PT Start Time 1345    PT Stop Time 1428    PT Time Calculation (min) 43 min    Activity Tolerance Patient tolerated treatment well    Behavior During Therapy WFL for tasks assessed/performed                  Past Medical History:  Diagnosis Date   Abnormal electrocardiogram    Abnormal heart sounds    Allergy 2020   upon arrival to [facility]   Anemia 10/04/2023   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   Chronic kidney disease 11/07/2021   Depression 2020   receiving appropriate therapy   Herniated lumbar intervertebral disc    Hypertension    Keratosis    benign   Left bundle branch block    Pacemaker    Pure hypercholesterolemia    Sick sinus syndrome (HCC)    Sleep apnea    VT (ventricular tachycardia) (HCC)    Past Surgical History:  Procedure Laterality Date   CARDIOVERSION N/A 09/28/2020   Procedure: CARDIOVERSION;  Surgeon: Alroy Aspen Lela Purple, MD;  Location: [facility];  Service: Cardiovascular;  Laterality: N/A;   hip replacement Left    INSERT / REPLACE / REMOVE PACEMAKER     JOINT REPLACEMENT     partial right knee, medial side   PPM GENERATOR CHANGEOUT N/A 04/30/2023   Procedure: PPM GENERATOR CHANGEOUT;  Surgeon: Tammie Fall, MD;  Location: [facility];  Service: Cardiovascular;  Laterality: N/A;   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   Procedure: LEFT TOTAL HIP ARTHROPLASTY ANTERIOR APPROACH;  Surgeon: Arnie Lao, MD;  Location: [facility];  Service: Orthopedics;  Laterality: Left;   VASECTOMY     Patient Active Problem List   Diagnosis Date Noted   Anemia 10/04/2023   Hyperlipidemia 10/04/2023    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   Left hip pain 02/01/2022   Environmental and seasonal allergies 02/01/2022   Hypertension 11/07/2021   Chronic kidney disease 11/07/2021   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   PERTINENT HISTORY:  Arthritis of left hip, depression, herniated lumbar IV disc, left BBB, THA left 2023, R partial knee replacement approx 2021 PACEMAKER--NO E-STIM  PAIN:  Are you having pain? Yes: NPRS scale: 5/10 Pain location: bilat super glutes  Pain description: achy, naggy, sharp at times Aggravating factors: lifting, going up steps Relieving factors: heat, tylenol , back brace  PRECAUTIONS: None  RED FLAGS: None   WEIGHT BEARING RESTRICTIONS: No  FALLS:  Has patient fallen in last 6 months? No  LIVING ENVIRONMENT: Steps on back deck  OCCUPATION: Retired  PLOF: Independent  PATIENT GOALS: get pain dialed down and get some exercises to strengthen   OBJECTIVE:  Note: Objective measures were completed at Evaluation unless otherwise noted.  DIAGNOSTIC FINDINGS:  1. No acute findings. 2. 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 3. Moderate spinal canal stenosis at L4-5 and  minimal canal stenosis at L3-4 which is multifactorial as described. Moderate bilateral neural foraminal narrowing at the L4-5 level right worse than left. Minimal bilateral neural foraminal narrowing at L2-3 and L3-4. 4. Grade 1 anterolisthesis of L4 on L5 due to facet arthropathy.    COGNITION: Overall cognitive status: Within functional limits for tasks assessed     SENSATION: No lack of sensation  POSTURE: rounded shoulders and forward head  PALPATION: TTP lateral R glute and R paraspinal L5,S1 region  LUMBAR ROM:   AROM eval  Flexion WFL  Extension WFL  Right lateral flexion WFL  Left lateral flexion Mild pain on the right side   Right rotation   Left rotation    (Blank rows = not tested)  LOWER EXTREMITY ROM:     Passive  Right eval Left eval  Hip flexion 125   Hip extension    Hip abduction    Hip adduction    Hip internal rotation Painful int he right hip    Hip external rotation    Knee flexion    Knee extension    Ankle dorsiflexion    Ankle plantarflexion    Ankle inversion    Ankle eversion     (Blank rows = not tested)  LOWER EXTREMITY MMT:    MMT Right eval Left eval Rt/Lt Right 5/13 Left 5/13 Right  6/11 Left 6/11  Hip flexion 32.5 34.5    37.8 38.9  Hip extension         Hip abduction 54.0 44.8 41.9/36.5 40.4  37.3 41.6 39.3  Hip adduction         Hip internal rotation         Hip external rotation         Knee flexion         Knee extension 59.8 49.5 49.1/51.0   53.4 54  Ankle dorsiflexion         Ankle plantarflexion         Ankle inversion         Ankle eversion          (Blank rows = not tested)   TREATMENT DATE:  6/11 Manual Therapy:  STM to bilat glutes and R sided lumbar paraspinals in prone. LAD to right ( hip replacement on left so not performed)   Nu-step 5 min reviewed set up for  independent use in the gym    There-ex LF knee extension 20# 2x12 LF seated row 30# 3x10 LF hip abduction 45 lbs 3x10  LF Leg press 20 lbs 3x10   5/30 Manual Therapy:  STM to bilat glutes and R sided lumbar paraspinals in prone. LAD to right ( hip replacement on left so not performed)   There-ex Leg press cybex 70 lbs LF triceps press down 2x10 45 lbs Nu-step 5 min reviewed set up for independent use in the gym     PATIENT EDUCATION:  Education details: HEP, symptom management, exercise form, rationale of interventions Person educated: Patient Education method: Explanation, Demonstration, Tactile cues, Verbal cues Education comprehension: verbalized understanding and returned demonstration  HOME EXERCISE PROGRAM:  Access Code: [email or web address] URL: [email or web address] Date: 03/07/2024 Prepared by: Lonzell Robin  Exercises - Theracane In Back  - 1 x daily - 7 x weekly - 3 sets - 10 reps - Standing Glute Med Mobilization with Small Ball on Wall  - 1 x daily - 7 x weekly - 3 sets - 10 reps  ASSESSMENT:  CLINICAL IMPRESSION: The patient is making progress. His strength has progress.d He is having less pain overall. We were able to advance his gym exercises today. He has had very little pain over the past few days. His gym exercise weights are approaching the level he was at before his injury. We will continue over the next 4-6 weeks to progress him back to an independent gym program. See below for goal specific progress   OBJECTIVE IMPAIRMENTS: decreased mobility.   ACTIVITY LIMITATIONS: lifting, bending, squatting, and stairs  PARTICIPATION LIMITATIONS: community activity and yard work  PERSONAL FACTORS: 1-2 comorbidities: DDD of the spine  are also affecting patient's functional outcome.   REHAB POTENTIAL: Good  CLINICAL DECISION MAKING: Stable/uncomplicated  EVALUATION COMPLEXITY: Low   GOALS: Goals reviewed with patient? Yes  SHORT TERM GOALS: Target  date: 03/21/2024   Pt will report decreased morning symptoms within 1 hour of waking up. Baseline: Goal status: achieved  2.  Pt will increase knee extension dynamometer strength by 5lbs. Baseline:  Goal status: ongoing- see objective  3.  Pt will increase hip abduction dynamometer strength by 5lbs Baseline:  Goal status: NOT MET 5/13    LONG TERM GOALS: Target date:  05/02/2024  Pt will be able to go up and down 8 stairs without increase in painin order to improve ability to ambulate in the community  Baseline:  Goal status:  less pain going up stairs but still minor pain 6/11  2.  Pt will increase knee extension dynamometer strength by 10lbs in order to improve ability to ambulate in the community  Baseline:  Goal status: improved 6/11  3.  Pt will increase hip abduction dynamometer strength by 10lbs. Baseline:  Goal status: improved 6/11    PLAN:  PT FREQUENCY: 1-2x/week  PT DURATION: 4 weeks  PLANNED INTERVENTIONS: 97164- PT Re-evaluation, 97110-Therapeutic exercises, 97530- Therapeutic activity, 97112- Neuromuscular re-education, 97535- Self Care, 78295- Manual therapy, [id number]- Gait training, [phone]- Aquatic Therapy, [phone]- Electrical stimulation (unattended), and [id number]- Electrical stimulation (manual).  PLAN FOR NEXT SESSION: STM to lateral hip and lumbar paraspinals. Add stretches to HEP. Progress to gym activity as tolerated.  Pt has a PACEMAKER--NO E-STIM   Signa Drier  PT DPT 05/08/24 8:05 AM

## 2024-05-08 ENCOUNTER — Encounter (HOSPITAL_BASED_OUTPATIENT_CLINIC_OR_DEPARTMENT_OTHER): Payer: Self-pay | Admitting: Physical Therapy

## 2024-05-10 ENCOUNTER — Other Ambulatory Visit (HOSPITAL_BASED_OUTPATIENT_CLINIC_OR_DEPARTMENT_OTHER): Payer: Self-pay

## 2024-05-23 ENCOUNTER — Ambulatory Visit: Payer: Self-pay | Admitting: Cardiology

## 2024-05-26 ENCOUNTER — Encounter (HOSPITAL_BASED_OUTPATIENT_CLINIC_OR_DEPARTMENT_OTHER): Payer: Self-pay | Admitting: Physical Therapy

## 2024-05-26 ENCOUNTER — Ambulatory Visit (HOSPITAL_BASED_OUTPATIENT_CLINIC_OR_DEPARTMENT_OTHER): Payer: Self-pay | Admitting: Physical Therapy

## 2024-05-26 ENCOUNTER — Other Ambulatory Visit (HOSPITAL_BASED_OUTPATIENT_CLINIC_OR_DEPARTMENT_OTHER): Payer: Self-pay

## 2024-05-26 DIAGNOSIS — M5459 Other low back pain: Secondary | ICD-10-CM

## 2024-05-26 DIAGNOSIS — R2689 Other abnormalities of gait and mobility: Secondary | ICD-10-CM

## 2024-05-26 DIAGNOSIS — M25551 Pain in right hip: Secondary | ICD-10-CM

## 2024-05-26 NOTE — Therapy (Signed)
 OUTPATIENT PHYSICAL THERAPY THORACOLUMBAR TREATMENT     Patient Name: Jimmy Palmer MRN: [id number] DOB:1941/04/11, 83 y.o., male Today's Date: 05/26/2024  END OF SESSION:  PT End of Session - 05/26/24 1317     Visit Number 16    Number of Visits 23    Date for PT Re-Evaluation 07/02/24    Authorization Type MCR A & B    PT Start Time 1315    PT Stop Time 1355    PT Time Calculation (min) 40 min    Activity Tolerance Patient tolerated treatment well    Behavior During Therapy WFL for tasks assessed/performed                  Past Medical History:  Diagnosis Date   Abnormal electrocardiogram    Abnormal heart sounds    Allergy 2020   upon arrival to [facility]   Anemia 10/04/2023   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   Chronic kidney disease 11/07/2021   Depression 2020   receiving appropriate therapy   Herniated lumbar intervertebral disc    Hypertension    Keratosis    benign   Left bundle branch block    Pacemaker    Pure hypercholesterolemia    Sick sinus syndrome (HCC)    Sleep apnea    VT (ventricular tachycardia) (HCC)    Past Surgical History:  Procedure Laterality Date   CARDIOVERSION N/A 09/28/2020   Procedure: CARDIOVERSION;  Surgeon: Alveta Aleene PARAS, MD;  Location: [facility];  Service: Cardiovascular;  Laterality: N/A;   hip replacement Left    INSERT / REPLACE / REMOVE PACEMAKER     JOINT REPLACEMENT     partial right knee, medial side   PPM GENERATOR CHANGEOUT N/A 04/30/2023   Procedure: PPM GENERATOR CHANGEOUT;  Surgeon: Waddell Danelle ORN, MD;  Location: [facility];  Service: Cardiovascular;  Laterality: N/A;   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   Procedure: LEFT TOTAL HIP ARTHROPLASTY ANTERIOR APPROACH;  Surgeon: Vernetta Lonni GRADE, MD;  Location: [facility];  Service: Orthopedics;  Laterality: Left;   VASECTOMY     Patient Active Problem List   Diagnosis Date Noted   Anemia 10/04/2023   Hyperlipidemia 10/04/2023    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   Left hip pain 02/01/2022   Environmental and seasonal allergies 02/01/2022   Hypertension 11/07/2021   Chronic kidney disease 11/07/2021   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 Minimal discomfort when I wake up in the AM.   PERTINENT HISTORY:  Arthritis of left hip, depression, herniated lumbar IV disc, left BBB, THA left 2023, R partial knee replacement approx 2021 PACEMAKER--NO E-STIM  PAIN:  Are you having pain? Yes: NPRS scale: 0/10 Pain location: Rt QL region Pain description: achy, naggy, sharp at times Aggravating factors: lifting, going up steps Relieving factors: heat, tylenol , back brace  PRECAUTIONS: None  RED FLAGS: None   WEIGHT BEARING RESTRICTIONS: No  FALLS:  Has patient fallen in last 6 months? No  LIVING ENVIRONMENT: Steps on back deck  OCCUPATION: Retired  PLOF: Independent  PATIENT GOALS: get pain dialed down and get some exercises to strengthen   OBJECTIVE:  Note: Objective measures were completed at Evaluation unless otherwise noted.  DIAGNOSTIC FINDINGS:  1. No acute findings. 2. 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 3. Moderate spinal canal stenosis at L4-5 and  minimal canal stenosis at L3-4 which is multifactorial as described. Moderate bilateral neural foraminal narrowing at the L4-5 level right worse than left. Minimal bilateral neural foraminal narrowing at L2-3 and L3-4. 4. Grade 1 anterolisthesis of L4 on L5 due to facet arthropathy.    COGNITION: Overall cognitive status: Within functional limits for tasks assessed     SENSATION: No lack of sensation  POSTURE: rounded shoulders and forward head  PALPATION: TTP lateral R glute and R paraspinal L5,S1 region  LUMBAR ROM:   AROM eval  Flexion WFL  Extension WFL  Right lateral flexion WFL  Left lateral flexion Mild pain on the right side   Right rotation   Left rotation    (Blank rows = not tested)  LOWER EXTREMITY ROM:     Passive  Right eval Left eval  Hip flexion 125   Hip extension    Hip abduction    Hip adduction    Hip internal rotation Painful int he right hip    Hip external rotation    Knee flexion    Knee extension    Ankle dorsiflexion    Ankle plantarflexion    Ankle inversion    Ankle eversion     (Blank rows = not tested)  LOWER EXTREMITY MMT:    MMT Right eval Left eval Rt/Lt Right 5/13 Left 5/13 Right  6/11 Left 6/11  Hip flexion 32.5 34.5    37.8 38.9  Hip extension         Hip abduction 54.0 44.8 41.9/36.5 40.4  37.3 41.6 39.3  Hip adduction         Hip internal rotation         Hip external rotation         Knee flexion         Knee extension 59.8 49.5 49.1/51.0   53.4 54  Ankle dorsiflexion         Ankle plantarflexion         Ankle inversion         Ankle eversion          (Blank rows = not tested)   TREATMENT DATE:  6/30 STM bilateral QL to glut med Sit<>stand with hip hinge Standing march with balance hold Shoulder flexion 5lb weights- cues for core contraction  6/11 Manual Therapy:  STM to  bilat glutes and R sided lumbar paraspinals in prone. LAD to right ( hip replacement on left so not performed)   Nu-step 5 min reviewed set up for independent use in the gym    There-ex LF knee extension 20# 2x12 LF seated row 30# 3x10 LF hip abduction 45 lbs 3x10  LF Leg press 20 lbs 3x10   5/30 Manual Therapy:  STM to bilat glutes and R sided lumbar paraspinals in prone. LAD to right ( hip replacement on left so not performed)   There-ex Leg press cybex 70 lbs LF triceps press down 2x10 45 lbs Nu-step 5 min reviewed set up for independent use in the gym     PATIENT EDUCATION:  Education details: HEP, symptom management, exercise form, rationale of interventions Person educated: Patient Education method: Explanation, Demonstration, Tactile cues, Verbal cues Education comprehension: verbalized understanding and returned demonstration  HOME EXERCISE PROGRAM:  Access Code: [email or web address] URL: [email or web address]  ASSESSMENT:  CLINICAL IMPRESSION: Tightness in bilateral glutes reduced with manual therapy. Notable lack of single leg control resulting in poor core contraction and tightness in lumbar spine. Added new exercises with body weight and balance to use strength gained from weight machines.    OBJECTIVE IMPAIRMENTS: decreased mobility.   ACTIVITY LIMITATIONS: lifting, bending, squatting, and stairs  PARTICIPATION LIMITATIONS: community activity and yard work  PERSONAL FACTORS: 1-2 comorbidities: DDD of the spine  are also affecting patient's functional outcome.   REHAB POTENTIAL: Good  CLINICAL DECISION MAKING: Stable/uncomplicated  EVALUATION COMPLEXITY: Low   GOALS: Goals reviewed with patient? Yes  SHORT TERM GOALS: Target date: 03/21/2024   Pt will report decreased morning symptoms within 1 hour of waking up. Baseline: Goal status: achieved  2.  Pt will increase knee extension dynamometer strength by 5lbs. Baseline:  Goal status:  ongoing- see objective  3.  Pt will increase hip abduction dynamometer strength by 5lbs Baseline:  Goal status: NOT MET 5/13    LONG TERM GOALS: Target date:  05/02/2024  Pt will be able to go up and down 8 stairs without increase in painin order to improve ability to ambulate in the community  Baseline:  Goal status:  less pain going up stairs but still minor pain 6/11  2.  Pt will increase knee extension dynamometer strength by 10lbs in order to improve ability to ambulate in the community  Baseline:  Goal status: improved 6/11  3.  Pt will increase hip abduction dynamometer strength by 10lbs. Baseline:  Goal status: improved 6/11    PLAN:  PT FREQUENCY: 1-2x/week  PT DURATION: 4 weeks  PLANNED INTERVENTIONS: 97164- PT Re-evaluation, 97110-Therapeutic exercises, 97530- Therapeutic activity, 97112- Neuromuscular re-education, 97535- Self Care, 02859- Manual therapy, [id number]- Gait training, [phone]- Aquatic Therapy, [phone]- Electrical stimulation (unattended), and [id number]- Electrical stimulation (manual).  PLAN FOR NEXT SESSION: STM to lateral hip and lumbar paraspinals. Add stretches to HEP. Progress to gym activity as tolerated.  Pt has a PACEMAKER--NO E-STIM   [place] C. Drystan Reader PT, DPT 05/26/24 1:56 PM

## 2024-06-04 ENCOUNTER — Encounter (HOSPITAL_BASED_OUTPATIENT_CLINIC_OR_DEPARTMENT_OTHER): Payer: Self-pay | Admitting: Physical Therapy

## 2024-06-04 ENCOUNTER — Ambulatory Visit (HOSPITAL_BASED_OUTPATIENT_CLINIC_OR_DEPARTMENT_OTHER): Attending: Family Medicine | Admitting: Physical Therapy

## 2024-06-04 DIAGNOSIS — R2689 Other abnormalities of gait and mobility: Secondary | ICD-10-CM | POA: Diagnosis present

## 2024-06-04 DIAGNOSIS — M25551 Pain in right hip: Secondary | ICD-10-CM | POA: Diagnosis present

## 2024-06-04 DIAGNOSIS — M5459 Other low back pain: Secondary | ICD-10-CM | POA: Diagnosis present

## 2024-06-04 NOTE — Therapy (Unsigned)
 OUTPATIENT PHYSICAL THERAPY THORACOLUMBAR TREATMENT     Patient Name: ARVEL OQUINN MRN: [id number] DOB:September 11, 1941, 83 y.o., male Today's Date: 06/04/2024  END OF SESSION:            Past Medical History:  Diagnosis Date   Abnormal electrocardiogram    Abnormal heart sounds    Allergy 2020   upon arrival to [facility]   Anemia 10/04/2023   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   Chronic kidney disease 11/07/2021   Depression 2020   receiving appropriate therapy   Herniated lumbar intervertebral disc    Hypertension    Keratosis    benign   Left bundle branch block    Pacemaker    Pure hypercholesterolemia    Sick sinus syndrome (HCC)    Sleep apnea    VT (ventricular tachycardia) (HCC)    Past Surgical History:  Procedure Laterality Date   CARDIOVERSION N/A 09/28/2020   Procedure: CARDIOVERSION;  Surgeon: Alveta Aleene PARAS, MD;  Location: [facility];  Service: Cardiovascular;  Laterality: N/A;   hip replacement Left    INSERT / REPLACE / REMOVE PACEMAKER     JOINT REPLACEMENT     partial right knee, medial side   PPM GENERATOR CHANGEOUT N/A 04/30/2023   Procedure: PPM GENERATOR CHANGEOUT;  Surgeon: Waddell Danelle ORN, MD;  Location: [facility];  Service: Cardiovascular;  Laterality: N/A;   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   Procedure: LEFT TOTAL HIP ARTHROPLASTY ANTERIOR APPROACH;  Surgeon: Vernetta Lonni GRADE, MD;  Location: [facility];  Service: Orthopedics;  Laterality: Left;   VASECTOMY     Patient Active Problem List   Diagnosis Date Noted   Anemia 10/04/2023   Hyperlipidemia 10/04/2023   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  ONSET DATE: about 3 weeks ago  SUBJECTIVE:                                                                                                                                                                                           SUBJECTIVE STATEMENT: Seem to have  leveled off since the injury. Minimal discomfort when I wake up in the AM.   PERTINENT HISTORY:  Arthritis of left hip, depression, herniated lumbar IV disc, left BBB, THA left 2023, R partial knee replacement approx 2021 PACEMAKER--NO E-STIM  PAIN:  Are you having pain? Yes: NPRS scale: 0/10 Pain location: Rt QL region Pain description: achy, naggy, sharp at times Aggravating factors: lifting, going up steps Relieving factors: heat, tylenol , back brace  PRECAUTIONS: None  RED FLAGS: None   WEIGHT BEARING RESTRICTIONS: No  FALLS:  Has patient fallen in last 6 months? No  LIVING ENVIRONMENT: Steps on back deck  OCCUPATION: Retired  PLOF: Independent  PATIENT GOALS: get pain dialed down and get some exercises to strengthen   OBJECTIVE:  Note: Objective measures were completed at Evaluation unless otherwise noted.  DIAGNOSTIC FINDINGS:  1. No acute findings. 2. 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 3. 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 Moderate bilateral neural foraminal narrowing at the L4-5 level right worse than left. Minimal bilateral neural foraminal narrowing at L2-3 and L3-4. 4. Grade 1 anterolisthesis of L4 on L5 due to facet arthropathy.    COGNITION: Overall cognitive status: Within functional limits for tasks assessed     SENSATION: No lack of  sensation  POSTURE: rounded shoulders and forward head  PALPATION: TTP lateral R glute and R paraspinal L5,S1 region  LUMBAR ROM:   AROM eval  Flexion WFL  Extension WFL  Right lateral flexion WFL  Left lateral flexion Mild pain on the right side   Right rotation   Left rotation    (Blank rows = not tested)  LOWER EXTREMITY ROM:     Passive  Right eval Left eval  Hip flexion 125   Hip extension    Hip abduction    Hip adduction    Hip internal rotation Painful int he right hip    Hip external rotation    Knee flexion    Knee extension    Ankle dorsiflexion    Ankle plantarflexion    Ankle inversion    Ankle eversion     (Blank rows = not tested)  LOWER EXTREMITY MMT:    MMT Right eval Left eval Rt/Lt Right 5/13 Left 5/13 Right  6/11 Left 6/11  Hip flexion 32.5 34.5    37.8 38.9  Hip extension         Hip abduction 54.0 44.8 41.9/36.5 40.4  37.3 41.6 39.3  Hip adduction         Hip internal rotation         Hip external rotation         Knee flexion         Knee extension 59.8 49.5 49.1/51.0   53.4 54  Ankle dorsiflexion         Ankle plantarflexion         Ankle inversion         Ankle eversion          (Blank rows = not tested)   TREATMENT DATE:  7/9   There-ex LF knee extension 25# 3x12 LF seated row 35# 3x12 LF hip abduction 45 lbs 3x10  LF Leg press 20 lbs 3x10     6/30 STM bilateral QL to glut med Sit<>stand with hip hinge Standing march with balance hold Shoulder flexion 5lb weights- cues for core contraction  6/11 Manual Therapy:  STM to bilat glutes and R sided lumbar paraspinals in prone. LAD to right ( hip replacement on left so not performed)   Nu-step 5 min reviewed set up for independent use in the gym    There-ex LF knee extension 20# 2x12 LF seated row 30# 3x10 LF hip abduction 45 lbs 3x10   LF Leg press 20 lbs 3x10   5/30 Manual Therapy:  STM to bilat glutes and R sided lumbar paraspinals in prone. LAD to right ( hip replacement on left so not performed)   There-ex Leg press cybex 70 lbs LF triceps press down 2x10 45 lbs Nu-step 5 min reviewed set up for independent use in the gym     PATIENT EDUCATION:  Education details: HEP, symptom management, exercise form, rationale of interventions Person educated: Patient Education method: Explanation, Demonstration, Tactile cues, Verbal cues Education comprehension: verbalized understanding and returned demonstration  HOME EXERCISE PROGRAM:  Access Code: [email or web address] URL: [email or web address]  ASSESSMENT:  CLINICAL IMPRESSION: Tightness in bilateral glutes reduced with manual therapy. Notable lack of single leg control resulting in poor core contraction and tightness in lumbar spine. Added new exercises with body weight and balance to use strength gained from weight machines.    OBJECTIVE IMPAIRMENTS: decreased mobility.   ACTIVITY LIMITATIONS: lifting, bending, squatting, and stairs  PARTICIPATION LIMITATIONS: community activity and yard work  PERSONAL FACTORS: 1-2 comorbidities: DDD of the spine  are also affecting patient's functional outcome.   REHAB POTENTIAL: Good  CLINICAL DECISION MAKING: Stable/uncomplicated  EVALUATION COMPLEXITY: Low   GOALS: Goals reviewed with patient? Yes  SHORT TERM GOALS: Target date: 03/21/2024   Pt will report decreased morning symptoms within 1 hour of waking up. Baseline: Goal status: achieved  2.  Pt will increase knee extension dynamometer strength by 5lbs. Baseline:  Goal status: ongoing- see objective  3.  Pt will increase hip abduction dynamometer strength by 5lbs Baseline:  Goal status: NOT MET 5/13    LONG TERM GOALS: Target date:  05/02/2024  Pt will be able to go up and down 8 stairs without increase in painin order to improve ability to  ambulate in the community  Baseline:  Goal status:  less pain going up stairs but still minor pain 6/11  2.  Pt will increase knee extension dynamometer strength by 10lbs in order to improve ability to ambulate in the community  Baseline:  Goal status: improved 6/11  3.  Pt will increase hip abduction dynamometer strength by 10lbs. Baseline:  Goal status: improved 6/11    PLAN:  PT FREQUENCY: 1-2x/week  PT DURATION: 4 weeks  PLANNED INTERVENTIONS: 97164- PT Re-evaluation, 97110-Therapeutic exercises, 97530- Therapeutic activity, 97112- Neuromuscular re-education, 97535- Self Care, 02859- Manual therapy, [id number]- Gait training, [phone]- Aquatic Therapy, [phone]- Electrical stimulation (unattended), and [id number]- Electrical stimulation (manual).  PLAN FOR NEXT SESSION: STM to lateral hip and lumbar paraspinals. Add stretches to HEP. Progress to gym activity as tolerated.  Pt has a PACEMAKER--NO E-STIM   Jessica C. Hightower PT, DPT 06/04/24 2:09 PM

## 2024-06-05 ENCOUNTER — Encounter (HOSPITAL_BASED_OUTPATIENT_CLINIC_OR_DEPARTMENT_OTHER): Payer: Self-pay | Admitting: Physical Therapy

## 2024-06-10 ENCOUNTER — Ambulatory Visit (HOSPITAL_BASED_OUTPATIENT_CLINIC_OR_DEPARTMENT_OTHER): Payer: Self-pay | Admitting: Physical Therapy

## 2024-06-10 ENCOUNTER — Encounter (HOSPITAL_BASED_OUTPATIENT_CLINIC_OR_DEPARTMENT_OTHER): Payer: Self-pay | Admitting: Physical Therapy

## 2024-06-10 DIAGNOSIS — M5459 Other low back pain: Secondary | ICD-10-CM

## 2024-06-10 DIAGNOSIS — R2689 Other abnormalities of gait and mobility: Secondary | ICD-10-CM

## 2024-06-10 DIAGNOSIS — M25551 Pain in right hip: Secondary | ICD-10-CM

## 2024-06-10 NOTE — Therapy (Signed)
 OUTPATIENT PHYSICAL THERAPY THORACOLUMBAR TREATMENT     Patient Name: Jimmy Palmer MRN: [id number] DOB:20-Apr-1941, 83 y.o., male Today's Date: 06/11/2024  END OF SESSION:  PT End of Session - 06/10/24 1327     Visit Number 18    Number of Visits 23    Date for PT Re-Evaluation 07/02/24    Authorization Type MCR A & B    PT Start Time 1322    PT Stop Time 1405    PT Time Calculation (min) 43 min    Activity Tolerance Patient tolerated treatment well    Behavior During Therapy WFL for tasks assessed/performed                   Past Medical History:  Diagnosis Date   Abnormal electrocardiogram    Abnormal heart sounds    Allergy 2020   upon arrival to [facility]   Anemia 10/04/2023   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   Chronic kidney disease 11/07/2021   Depression 2020   receiving appropriate therapy   Herniated lumbar intervertebral disc    Hypertension    Keratosis    benign   Left bundle branch block    Pacemaker    Pure hypercholesterolemia    Sick sinus syndrome (HCC)    Sleep apnea    VT (ventricular tachycardia) (HCC)    Past Surgical History:  Procedure Laterality Date   CARDIOVERSION N/A 09/28/2020   Procedure: CARDIOVERSION;  Surgeon: Alveta Aleene PARAS, MD;  Location: [facility];  Service: Cardiovascular;  Laterality: N/A;   hip replacement Left    INSERT / REPLACE / REMOVE PACEMAKER     JOINT REPLACEMENT     partial right knee, medial side   PPM GENERATOR CHANGEOUT N/A 04/30/2023   Procedure: PPM GENERATOR CHANGEOUT;  Surgeon: Waddell Danelle ORN, MD;  Location: [facility];  Service: Cardiovascular;  Laterality: N/A;   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   Procedure: LEFT TOTAL HIP ARTHROPLASTY ANTERIOR APPROACH;  Surgeon: Vernetta Lonni GRADE, MD;  Location: [facility];  Service: Orthopedics;  Laterality: Left;   VASECTOMY     Patient Active Problem List   Diagnosis Date Noted   Anemia 10/04/2023   Hyperlipidemia  10/04/2023   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   Left hip pain 02/01/2022   Environmental and seasonal allergies 02/01/2022   Hypertension 11/07/2021   Chronic kidney disease 11/07/2021   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  Pt states he is improving.  Pt's excruciating pain first thing in AM has significant dissipated.   PERTINENT HISTORY:  Arthritis of left hip, depression, herniated lumbar IV disc, left BBB, THA left 2023, R partial knee replacement approx 2021 PACEMAKER--NO E-STIM  PAIN:  Are you having pain? Yes: NPRS scale: 3/10 Pain location: Rt sided lumbar flank Pain description: achy, naggy, sharp at times Aggravating factors: lifting, going up steps Relieving factors: heat, tylenol , back brace  PRECAUTIONS: None  RED FLAGS: None   WEIGHT BEARING RESTRICTIONS: No  FALLS:  Has patient fallen in last 6 months? No  LIVING ENVIRONMENT: Steps on back deck  OCCUPATION: Retired  PLOF: Independent  PATIENT GOALS: get pain dialed down and get some exercises to strengthen   OBJECTIVE:  Note: Objective measures were completed at Evaluation unless otherwise noted.  DIAGNOSTIC FINDINGS:  1. No acute findings. 2. 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 3. 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 Moderate bilateral neural foraminal narrowing at the L4-5 level right worse than left. Minimal bilateral neural foraminal narrowing at L2-3 and L3-4. 4. Grade 1 anterolisthesis of L4 on L5 due to facet arthropathy.    COGNITION: Overall cognitive status: Within functional limits for tasks assessed     SENSATION: No lack of sensation  POSTURE: rounded shoulders and forward head  PALPATION: TTP lateral R glute and R paraspinal L5,S1 region  LUMBAR ROM:   AROM eval  Flexion WFL  Extension WFL  Right lateral flexion WFL  Left lateral flexion Mild pain on the right side   Right rotation   Left rotation    (Blank rows = not tested)  LOWER EXTREMITY ROM:     Passive  Right eval Left eval  Hip flexion 125   Hip extension    Hip abduction    Hip adduction    Hip internal rotation Painful int he right hip    Hip external rotation    Knee flexion    Knee extension    Ankle dorsiflexion    Ankle plantarflexion    Ankle inversion    Ankle eversion     (Blank rows = not tested)  LOWER EXTREMITY MMT:    MMT Right eval Left eval Rt/Lt Right 5/13 Left 5/13 Right  6/11 Left 6/11  Hip flexion 32.5 34.5    37.8 38.9  Hip extension         Hip abduction 54.0 44.8 41.9/36.5 40.4  37.3 41.6 39.3  Hip adduction         Hip internal rotation         Hip external rotation         Knee flexion         Knee extension 59.8 49.5 49.1/51.0   53.4 54  Ankle dorsiflexion         Ankle plantarflexion         Ankle inversion         Ankle eversion          (Blank rows = not tested)   TREATMENT DATE:  7/15 Manual Therapy:  STM to bilat glutes and bilat lumbar paraspinals in prone.  Nustep L4-5 x 5 mins   UE/Les Cybex leg press 2x10  70# LF knee extension  25# 2x12 LF hip abduction  45# 3x10 LF seated row 35# 3x10-12  7/9   There-ex LF knee extension 25# 3x12 LF seated row 35# 3x12 LF hip abduction 45 lbs 3x10  LF Leg press 20 lbs 3x10   Manual Therapy:  STM to bilat glutes and R sided lumbar paraspinals in prone.   6/30 STM bilateral QL to glut med Sit<>stand with hip hinge Standing march with balance hold Shoulder flexion 5lb weights- cues for core contraction  6/11 Manual Therapy:  STM to bilat glutes and R sided lumbar paraspinals in prone. LAD to right ( hip replacement on left so not performed)   Nu-step 5 min reviewed set up for independent use in the gym    There-ex LF knee extension 20# 2x12 LF seated row 30# 3x10 LF hip abduction 45 lbs 3x10  LF Leg press 20 lbs 3x10   5/30 Manual Therapy:  STM to bilat glutes and R sided lumbar paraspinals in prone. LAD to right ( hip replacement on left so not performed)   There-ex Leg press cybex 70 lbs LF triceps press down 2x10 45 lbs Nu-step 5 min reviewed set up for independent use in the gym     PATIENT EDUCATION:  Education details: HEP, symptom management, exercise form, rationale of interventions Person educated: Patient Education method: Explanation, Demonstration, Tactile cues, Verbal cues Education comprehension: verbalized understanding and returned demonstration  HOME EXERCISE PROGRAM:  Access Code: [email or web address] URL: [email or web address]  ASSESSMENT:  CLINICAL IMPRESSION: Pt presents to Rx stating he is improving.  PT continued to work on gym exercises and soft tissue mobilization techniques.  He tolerated exercises and manual therapy well.  PT educated pt in proper set up of machines.  Pt had some tenderness with STM to R glute though has improved mm spasms and soft tissue tightness in glutes.  He responded well to Rx having no c/o's after Rx.  Pt is appreciative of PT services.     OBJECTIVE IMPAIRMENTS: decreased mobility.   ACTIVITY LIMITATIONS: lifting, bending, squatting, and stairs  PARTICIPATION LIMITATIONS: community activity and yard work  PERSONAL FACTORS: 1-2 comorbidities: DDD of the spine  are also affecting patient's functional outcome.   REHAB POTENTIAL: Good  CLINICAL DECISION MAKING: Stable/uncomplicated  EVALUATION COMPLEXITY: Low   GOALS: Goals reviewed with patient? Yes  SHORT TERM GOALS: Target date: 03/21/2024   Pt will report decreased morning symptoms within 1 hour of waking up. Baseline: Goal status: achieved  2.  Pt will increase knee extension dynamometer strength by 5lbs. Baseline:  Goal status: ongoing- see objective  3.  Pt will increase hip abduction dynamometer strength by 5lbs Baseline:  Goal status: NOT MET 5/13    LONG TERM GOALS: Target date:  05/02/2024  Pt will be able to go up and down 8 stairs without increase in painin order to improve ability to ambulate in the community  Baseline:  Goal status:  less pain going up stairs but still minor pain 6/11  2.  Pt will increase knee extension dynamometer strength by 10lbs in order to improve ability to ambulate in the community  Baseline:  Goal status: improved 6/11  3.  Pt will increase hip abduction dynamometer strength by 10lbs. Baseline:  Goal status: improved 6/11    PLAN:  PT FREQUENCY: 1-2x/week  PT DURATION: 4 weeks  PLANNED INTERVENTIONS: [id number]- PT Re-evaluation, 97110-Therapeutic exercises, 97530- Therapeutic activity, [id number]- Neuromuscular re-education, 97535- Self Care, [id number]- Manual therapy, [id number]- Gait training, [phone]- Aquatic Therapy, [phone]- Electrical stimulation (unattended), and [id number]- Electrical stimulation (manual).  PLAN FOR NEXT SESSION: STM to lateral hip and lumbar paraspinals. Add stretches to HEP. Progress to gym activity as tolerated.  Pt has a PACEMAKER--NO E-STIM   Leigh Minerva III PT, DPT 06/11/24 5:05  PM

## 2024-06-12 ENCOUNTER — Encounter (HOSPITAL_BASED_OUTPATIENT_CLINIC_OR_DEPARTMENT_OTHER): Payer: Self-pay | Admitting: Physical Therapy

## 2024-06-12 ENCOUNTER — Ambulatory Visit (HOSPITAL_BASED_OUTPATIENT_CLINIC_OR_DEPARTMENT_OTHER): Admitting: Physical Therapy

## 2024-06-12 DIAGNOSIS — M5459 Other low back pain: Secondary | ICD-10-CM | POA: Diagnosis not present

## 2024-06-12 DIAGNOSIS — M25551 Pain in right hip: Secondary | ICD-10-CM

## 2024-06-12 DIAGNOSIS — R2689 Other abnormalities of gait and mobility: Secondary | ICD-10-CM

## 2024-06-12 NOTE — Therapy (Signed)
 OUTPATIENT PHYSICAL THERAPY THORACOLUMBAR TREATMENT     Patient Name: Jimmy Palmer MRN: [id number] DOB:1941/10/14, 83 y.o., male Today's Date: 06/13/2024  END OF SESSION:  PT End of Session - 06/12/24 1326     Visit Number 19    Number of Visits 23    Date for PT Re-Evaluation 07/02/24    Authorization Type MCR A & B    PT Start Time 1323    PT Stop Time 1404    PT Time Calculation (min) 41 min    Activity Tolerance Patient tolerated treatment well    Behavior During Therapy WFL for tasks assessed/performed                   Past Medical History:  Diagnosis Date   Abnormal electrocardiogram    Abnormal heart sounds    Allergy 2020   upon arrival to [facility]   Anemia 10/04/2023   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   Chronic kidney disease 11/07/2021   Depression 2020   receiving appropriate therapy   Herniated lumbar intervertebral disc    Hypertension    Keratosis    benign   Left bundle branch block    Pacemaker    Pure hypercholesterolemia    Sick sinus syndrome (HCC)    Sleep apnea    VT (ventricular tachycardia) (HCC)    Past Surgical History:  Procedure Laterality Date   CARDIOVERSION N/A 09/28/2020   Procedure: CARDIOVERSION;  Surgeon: Alveta Aleene PARAS, MD;  Location: [facility];  Service: Cardiovascular;  Laterality: N/A;   hip replacement Left    INSERT / REPLACE / REMOVE PACEMAKER     JOINT REPLACEMENT     partial right knee, medial side   PPM GENERATOR CHANGEOUT N/A 04/30/2023   Procedure: PPM GENERATOR CHANGEOUT;  Surgeon: Waddell Danelle ORN, MD;  Location: [facility];  Service: Cardiovascular;  Laterality: N/A;   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   Procedure: LEFT TOTAL HIP ARTHROPLASTY ANTERIOR APPROACH;  Surgeon: Vernetta Lonni GRADE, MD;  Location: [facility];  Service: Orthopedics;  Laterality: Left;   VASECTOMY     Patient Active Problem List   Diagnosis Date Noted   Anemia 10/04/2023   Hyperlipidemia  10/04/2023   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   Left hip pain 02/01/2022   Environmental and seasonal allergies 02/01/2022   Hypertension 11/07/2021   Chronic kidney disease 11/07/2021   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    PERTINENT HISTORY:  Arthritis of left hip, depression, herniated lumbar IV disc, left BBB, THA left 2023, R partial knee replacement approx 2021 PACEMAKER--NO E-STIM  PAIN:  Are you having pain? Yes: NPRS scale: 3/10 Pain location: Rt sided lower lumbar and glute Pain description: achy, naggy, sharp at times Aggravating factors: lifting, going up steps Relieving factors: heat, tylenol , back brace  PRECAUTIONS: None  RED FLAGS: None   WEIGHT BEARING RESTRICTIONS: No  FALLS:  Has patient fallen in last 6 months? No  LIVING ENVIRONMENT: Steps on back deck  OCCUPATION: Retired  PLOF: Independent  PATIENT GOALS: get pain dialed down and get some exercises to strengthen   OBJECTIVE:  Note: Objective measures were completed at Evaluation unless otherwise noted.  DIAGNOSTIC FINDINGS:  1. No acute findings. 2. 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 3. Moderate spinal canal stenosis at L4-5 and minimal canal  stenosis at L3-4 which is multifactorial as described. Moderate bilateral neural foraminal narrowing at the L4-5 level right worse than left. Minimal bilateral neural foraminal narrowing at L2-3 and L3-4. 4. Grade 1 anterolisthesis of L4 on L5 due to facet arthropathy.    COGNITION: Overall cognitive status: Within functional limits for tasks assessed     SENSATION: No lack of sensation  POSTURE: rounded shoulders and forward head  PALPATION: TTP lateral R glute and R paraspinal L5,S1 region  LUMBAR ROM:   AROM eval  Flexion WFL  Extension WFL  Right lateral flexion WFL  Left lateral flexion Mild pain on the right side   Right rotation   Left rotation    (Blank rows = not tested)  LOWER EXTREMITY ROM:     Passive  Right eval Left eval  Hip flexion 125   Hip extension    Hip abduction    Hip adduction    Hip internal rotation Painful int he right hip    Hip external rotation    Knee flexion    Knee extension    Ankle dorsiflexion    Ankle plantarflexion    Ankle inversion    Ankle eversion     (Blank rows = not tested)  LOWER EXTREMITY MMT:    MMT Right eval Left eval Rt/Lt Right 5/13 Left 5/13 Right  6/11 Left 6/11  Hip flexion 32.5 34.5    37.8 38.9  Hip extension         Hip abduction 54.0 44.8 41.9/36.5 40.4  37.3 41.6 39.3  Hip adduction         Hip internal rotation         Hip external rotation         Knee flexion         Knee extension 59.8 49.5 49.1/51.0   53.4 54  Ankle dorsiflexion         Ankle plantarflexion         Ankle inversion         Ankle eversion          (Blank rows = not tested)   TREATMENT DATE:  7/17 Manual Therapy:  STM to bilat glutes and bilat lumbar paraspinals in prone.  PT also performed rolling to bilat glutes with the roller.  Nustep L5 x 5 mins  UE/Les Cybex leg press 3x10  70# LF  knee extension  25# 2x12 LF hip abduction  45# 3x10 LF seated row 35# 3x10  7/15 Manual Therapy:  STM to bilat glutes and bilat lumbar paraspinals in prone.  Nustep L4-5 x 5 mins  UE/Les Cybex leg press 2x10  70# LF knee extension  25# 2x12 LF hip abduction  45# 3x10 LF seated row 35# 3x10-12  7/9   There-ex LF knee extension 25# 3x12 LF seated row 35# 3x12 LF hip abduction 45 lbs 3x10  LF Leg press 20 lbs 3x10   Manual Therapy:  STM to bilat glutes and R sided lumbar paraspinals in prone.   6/30 STM bilateral QL to glut med Sit<>stand with hip hinge Standing march with balance hold Shoulder flexion 5lb weights- cues for core contraction  6/11 Manual Therapy:  STM to bilat glutes and R sided lumbar paraspinals in prone. LAD to right ( hip replacement on left so not performed)   Nu-step 5 min reviewed set up for independent use in the gym    There-ex LF knee extension 20# 2x12 LF seated row 30# 3x10 LF hip abduction 45 lbs 3x10  LF Leg press 20 lbs 3x10     PATIENT EDUCATION:  Education details: HEP, symptom management, exercise form, rationale of interventions Person educated: Patient Education method: Explanation, Demonstration, Tactile cues, Verbal cues Education comprehension: verbalized understanding and returned demonstration  HOME EXERCISE PROGRAM:  Access Code: [email or web address] URL: [email or web address]  ASSESSMENT:  CLINICAL IMPRESSION: PT continued to work on gym exercises and soft tissue mobilization techniques.  PT used a roller on bilat glutes today and responded well.  Pt has improved mm spasms and soft tissue tightness.  PT educated pt in proper set up of gyn machines.  He performed exercises well without c/o's and had no c/o's after Rx.  Pt is appreciative of PT services.    OBJECTIVE IMPAIRMENTS: decreased mobility.   ACTIVITY LIMITATIONS: lifting, bending, squatting, and stairs  PARTICIPATION LIMITATIONS: community activity and  yard work  PERSONAL FACTORS: 1-2 comorbidities: DDD of the spine  are also affecting patient's functional outcome.   REHAB POTENTIAL: Good  CLINICAL DECISION MAKING: Stable/uncomplicated  EVALUATION COMPLEXITY: Low   GOALS: Goals reviewed with patient? Yes  SHORT TERM GOALS: Target date: 03/21/2024   Pt will report decreased morning symptoms within 1 hour of waking up. Baseline: Goal status: achieved  2.  Pt will increase knee extension dynamometer strength by 5lbs. Baseline:  Goal status: ongoing- see objective  3.  Pt will increase hip abduction dynamometer strength by 5lbs Baseline:  Goal status: NOT MET 5/13    LONG TERM GOALS: Target date:  05/02/2024  Pt will be able to go up and down 8 stairs without increase in painin order to improve ability to ambulate in the community  Baseline:  Goal status:  less pain going up stairs but still minor pain 6/11  2.  Pt will increase knee extension dynamometer strength by 10lbs in order to improve ability to ambulate in the community  Baseline:  Goal status: improved 6/11  3.  Pt will increase hip abduction dynamometer strength by 10lbs. Baseline:  Goal status: improved 6/11    PLAN:  PT FREQUENCY: 1-2x/week  PT DURATION: 4 weeks  PLANNED INTERVENTIONS: 02835- PT Re-evaluation, 97110-Therapeutic  exercises, 97530- Therapeutic activity, [id number]- Neuromuscular re-education, [id number]- Self Care, 02859- Manual therapy, [id number]- Gait training, [phone]- Aquatic Therapy, [phone]- Electrical stimulation (unattended), and [id number]- Electrical stimulation (manual).  PLAN FOR NEXT SESSION: STM to lateral hip and lumbar paraspinals. Add stretches to HEP. Progress to gym activity as tolerated.  Pt has a PACEMAKER--NO E-STIM   Leigh Minerva III PT, DPT 06/13/24 12:52 PM

## 2024-06-15 NOTE — Therapy (Incomplete)
 OUTPATIENT PHYSICAL THERAPY THORACOLUMBAR TREATMENT Progress Note Reporting Period 04/10/24 to 06/16/24  See note below for Objective Data and Assessment of Progress/Goals.         Patient Name: Jimmy Palmer MRN: [id number] DOB:1941/02/11, 83 y.o., male Today's Date: 06/15/2024  END OF SESSION:             Past Medical History:  Diagnosis Date   Abnormal electrocardiogram    Abnormal heart sounds    Allergy 2020   upon arrival to [facility]   Anemia 10/04/2023   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   Chronic kidney disease 11/07/2021   Depression 2020   receiving appropriate therapy   Herniated lumbar intervertebral disc    Hypertension    Keratosis    benign   Left bundle branch block    Pacemaker    Pure hypercholesterolemia    Sick sinus syndrome (HCC)    Sleep apnea    VT (ventricular tachycardia) (HCC)    Past Surgical History:  Procedure Laterality Date   CARDIOVERSION N/A 09/28/2020   Procedure: CARDIOVERSION;  Surgeon: Alveta Aleene PARAS, MD;  Location: [facility];  Service: Cardiovascular;  Laterality: N/A;   hip replacement Left    INSERT / REPLACE / REMOVE PACEMAKER     JOINT REPLACEMENT     partial right knee, medial side   PPM GENERATOR CHANGEOUT N/A 04/30/2023   Procedure: PPM GENERATOR CHANGEOUT;  Surgeon: Waddell Danelle ORN, MD;  Location: [facility];  Service: Cardiovascular;  Laterality: N/A;   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   Procedure: LEFT TOTAL HIP ARTHROPLASTY ANTERIOR APPROACH;  Surgeon: Vernetta Lonni GRADE, MD;  Location: [facility];  Service: Orthopedics;  Laterality: Left;   VASECTOMY     Patient Active Problem List   Diagnosis Date Noted   Anemia 10/04/2023   Hyperlipidemia 10/04/2023   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  ONSET DATE: about 3 weeks ago  SUBJECTIVE:  SUBJECTIVE STATEMENT: Pt states he felt fine after prior Rx.    PERTINENT HISTORY:  Arthritis of left hip, depression, herniated lumbar IV disc, left BBB, THA left 2023, R partial knee replacement approx 2021 PACEMAKER--NO E-STIM  PAIN:  Are you having pain? Yes: NPRS scale: 3/10 Pain location: Rt sided lower lumbar and glute Pain description: achy, naggy, sharp at times Aggravating factors: lifting, going up steps Relieving factors: heat, tylenol , back brace  PRECAUTIONS: None  RED FLAGS: None   WEIGHT BEARING RESTRICTIONS: No  FALLS:  Has patient fallen in last 6 months? No  LIVING ENVIRONMENT: Steps on back deck  OCCUPATION: Retired  PLOF: Independent  PATIENT GOALS: get pain dialed down and get some exercises to strengthen   OBJECTIVE:  Note: Objective measures were completed at Evaluation unless otherwise noted.  DIAGNOSTIC FINDINGS:  1. No acute findings. 2. 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 3. 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 Moderate bilateral neural foraminal narrowing at the L4-5 level right worse than left. Minimal bilateral neural foraminal narrowing at L2-3 and L3-4. 4. Grade 1 anterolisthesis of L4 on L5 due to facet arthropathy.    COGNITION: Overall  cognitive status: Within functional limits for tasks assessed     SENSATION: No lack of sensation  POSTURE: rounded shoulders and forward head  PALPATION: TTP lateral R glute and R paraspinal L5,S1 region  LUMBAR ROM:   AROM eval 7/21  Flexion WFL   Extension WFL   Right lateral flexion WFL   Left lateral flexion Mild pain on the right side  -----  Right rotation    Left rotation     (Blank rows = not tested)  LOWER EXTREMITY ROM:     Passive  Right eval Left eval  Hip flexion 125   Hip extension    Hip abduction    Hip adduction    Hip internal rotation Painful int he right hip    Hip external rotation    Knee flexion    Knee extension    Ankle dorsiflexion    Ankle plantarflexion    Ankle inversion    Ankle eversion     (Blank rows = not tested)  LOWER EXTREMITY MMT:    MMT Right eval Left eval Rt/Lt Right 5/13 Left 5/13 Right  6/11 Left 6/11 Right 7/21 Left 7/21  Hip flexion 32.5 34.5    37.8 38.9    Hip extension           Hip abduction 54.0 44.8 41.9/36.5 40.4  37.3 41.6 39.3    Hip adduction           Hip internal rotation           Hip external rotation           Knee flexion           Knee extension 59.8 49.5 49.1/51.0   53.4 54    Ankle dorsiflexion           Ankle plantarflexion           Ankle inversion           Ankle eversion            (Blank rows = not tested)   Modified Oswestry Prior/Current:  10%   TREATMENT DATE:  7/21    7/17 Manual Therapy:  STM to bilat glutes and bilat lumbar paraspinals in prone.  PT also performed rolling to bilat glutes with the roller.  Nustep L5 x 5 mins  UE/Les Cybex leg press 3x10  70# LF knee extension  25# 2x12 LF hip abduction  45# 3x10 LF seated row 35# 3x10  7/15 Manual Therapy:  STM to bilat glutes and bilat lumbar paraspinals in prone.  Nustep L4-5 x 5 mins   UE/Les Cybex leg press 2x10  70# LF knee extension  25# 2x12 LF hip abduction  45# 3x10 LF seated row 35# 3x10-12  7/9   There-ex LF knee extension 25# 3x12 LF seated row 35# 3x12 LF hip abduction 45 lbs 3x10  LF Leg press 20 lbs 3x10   Manual Therapy:  STM to bilat glutes and R sided lumbar paraspinals in prone.   6/30 STM bilateral QL to glut med Sit<>stand with hip hinge Standing march with balance hold Shoulder flexion 5lb weights- cues for core contraction  6/11 Manual Therapy:  STM to bilat glutes and R sided lumbar paraspinals in prone. LAD to right ( hip replacement on left so not performed)   Nu-step 5 min reviewed set up for independent use in the gym    There-ex LF knee extension 20# 2x12 LF seated row 30# 3x10 LF hip abduction 45 lbs 3x10  LF Leg press 20 lbs 3x10     PATIENT EDUCATION:  Education details: HEP, symptom management, exercise form, rationale of interventions Person educated: Patient Education method: Explanation, Demonstration, Tactile cues, Verbal cues Education comprehension: verbalized understanding and returned demonstration  HOME EXERCISE PROGRAM:  Access Code: [email or web address] URL: [email or web address]  ASSESSMENT:  CLINICAL IMPRESSION: PT continued to work on gym exercises and soft tissue mobilization techniques.  PT used a roller on bilat glutes today and responded well.  Pt has improved mm spasms and soft tissue tightness.  PT educated pt in proper set up of gyn machines.  He performed exercises well without c/o's and had no c/o's after Rx.  Pt is appreciative of PT services.    OBJECTIVE IMPAIRMENTS: decreased mobility.   ACTIVITY LIMITATIONS: lifting, bending, squatting, and stairs  PARTICIPATION LIMITATIONS: community activity and yard work  PERSONAL FACTORS: 1-2 comorbidities: DDD of the spine  are also affecting patient's functional outcome.   REHAB POTENTIAL: Good  CLINICAL DECISION MAKING:  Stable/uncomplicated  EVALUATION COMPLEXITY: Low   GOALS: Goals reviewed with patient? Yes  SHORT TERM GOALS: Target date: 03/21/2024   Pt will report decreased morning symptoms within 1 hour of waking up. Baseline: Goal status: achieved  2.  Pt will increase knee extension dynamometer strength by 5lbs. Baseline:  Goal status: ongoing- see objective  3.  Pt will increase hip abduction dynamometer strength by 5lbs Baseline:  Goal status: NOT MET 5/13    LONG TERM GOALS: Target date:  05/02/2024  Pt will be able to go up and down 8 stairs without increase in painin order to improve ability to ambulate in the community  Baseline:  Goal status:  less pain going up stairs but still minor pain 6/11  2.  Pt will increase knee extension dynamometer strength by 10lbs in order to improve ability to ambulate in the community  Baseline:  Goal status: improved 6/11  3.  Pt will increase hip abduction dynamometer strength by 10lbs. Baseline:  Goal status: improved 6/11    PLAN:  PT FREQUENCY: 1-2x/week  PT DURATION: 4 weeks  PLANNED INTERVENTIONS:  02835- PT Re-evaluation, 97110-Therapeutic exercises, 97530- Therapeutic activity, [id number]- Neuromuscular re-education, 97535- Self Care, 02859- Manual therapy, [id number]- Gait training, [phone]- Aquatic Therapy, [phone]- Electrical stimulation (unattended), and [id number]- Electrical stimulation (manual).  PLAN FOR NEXT SESSION: STM to lateral hip and lumbar paraspinals. Add stretches to HEP. Progress to gym activity as tolerated.  Pt has a PACEMAKER--NO E-STIM   Leigh Minerva III PT, DPT 06/15/24 8:23 AM

## 2024-06-16 ENCOUNTER — Ambulatory Visit (HOSPITAL_BASED_OUTPATIENT_CLINIC_OR_DEPARTMENT_OTHER): Admitting: Physical Therapy

## 2024-06-20 ENCOUNTER — Ambulatory Visit (HOSPITAL_BASED_OUTPATIENT_CLINIC_OR_DEPARTMENT_OTHER): Payer: Self-pay | Admitting: Physical Therapy

## 2024-06-20 ENCOUNTER — Encounter (HOSPITAL_BASED_OUTPATIENT_CLINIC_OR_DEPARTMENT_OTHER): Payer: Self-pay | Admitting: Physical Therapy

## 2024-06-20 DIAGNOSIS — M5459 Other low back pain: Secondary | ICD-10-CM | POA: Diagnosis not present

## 2024-06-20 DIAGNOSIS — M25551 Pain in right hip: Secondary | ICD-10-CM

## 2024-06-20 DIAGNOSIS — R2689 Other abnormalities of gait and mobility: Secondary | ICD-10-CM

## 2024-06-20 NOTE — Therapy (Signed)
 OUTPATIENT PHYSICAL THERAPY THORACOLUMBAR TREATMENT/progress note. Progress Note Reporting Period 04/10/24 to 06/20/24  See note below for Objective Data and Assessment of Progress/Goals.         Patient Name: Jimmy Palmer MRN: [id number] DOB:07-16-1941, 83 y.o., male Today's Date: 06/20/2024  END OF SESSION:  PT End of Session - 06/20/24 1452     Visit Number 20    Number of Visits 26    Date for PT Re-Evaluation 08/01/24    Authorization Type MCR A & B    PT Start Time 1303    PT Stop Time 1345    PT Time Calculation (min) 42 min    Activity Tolerance Patient tolerated treatment well    Behavior During Therapy WFL for tasks assessed/performed                    Past Medical History:  Diagnosis Date   Abnormal electrocardiogram    Abnormal heart sounds    Allergy 2020   upon arrival to [facility]   Anemia 10/04/2023   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   Chronic kidney disease 11/07/2021   Depression 2020   receiving appropriate therapy   Herniated lumbar intervertebral disc    Hypertension    Keratosis    benign   Left bundle branch block    Pacemaker    Pure hypercholesterolemia    Sick sinus syndrome (HCC)    Sleep apnea    VT (ventricular tachycardia) (HCC)    Past Surgical History:  Procedure Laterality Date   CARDIOVERSION N/A 09/28/2020   Procedure: CARDIOVERSION;  Surgeon: Alveta Aleene PARAS, MD;  Location: [facility];  Service: Cardiovascular;  Laterality: N/A;   hip replacement Left    INSERT / REPLACE / REMOVE PACEMAKER     JOINT REPLACEMENT     partial right knee, medial side   PPM GENERATOR CHANGEOUT N/A 04/30/2023   Procedure: PPM GENERATOR CHANGEOUT;  Surgeon: Waddell Danelle ORN, MD;  Location: [facility];  Service: Cardiovascular;  Laterality: N/A;   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   Procedure: LEFT TOTAL HIP ARTHROPLASTY ANTERIOR APPROACH;  Surgeon: Vernetta Lonni GRADE, MD;  Location: [facility];  Service:  Orthopedics;  Laterality: Left;   VASECTOMY     Patient Active Problem List   Diagnosis Date Noted   Anemia 10/04/2023   Hyperlipidemia 10/04/2023   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   Left hip pain 02/01/2022   Environmental and seasonal allergies 02/01/2022   Hypertension 11/07/2021   Chronic kidney disease 11/07/2021   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  He is having no significant pain today.  Therapy to perform progress note.  PERTINENT HISTORY:  Arthritis of left hip, depression, herniated lumbar IV disc, left BBB, THA left 2023, R partial knee replacement approx 2021 PACEMAKER--NO E-STIM  PAIN:  Are you having pain? Yes: NPRS scale: 3/10 Pain location: Rt sided lower lumbar and glute Pain description: achy, naggy, sharp at times Aggravating factors: lifting, going up steps Relieving factors: heat, tylenol , back brace  PRECAUTIONS: None  RED FLAGS: None   WEIGHT BEARING RESTRICTIONS: No  FALLS:  Has patient fallen in last 6 months? No  LIVING ENVIRONMENT: Steps on back deck  OCCUPATION: Retired  PLOF: Independent  PATIENT GOALS: get pain dialed down and get some exercises to strengthen   OBJECTIVE:  Note: Objective measures were completed at Evaluation unless  otherwise noted.  DIAGNOSTIC FINDINGS:  1. No acute findings. 2. 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 3. 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 Moderate bilateral neural foraminal narrowing at the L4-5 level right worse than left. Minimal bilateral neural foraminal narrowing at L2-3 and L3-4. 4. Grade 1 anterolisthesis of L4 on L5 due to facet arthropathy.    COGNITION: Overall cognitive status: Within functional limits for tasks assessed     SENSATION: No lack of sensation  POSTURE: rounded shoulders and forward head  PALPATION: TTP lateral R glute and R paraspinal L5,S1 region  LUMBAR ROM:   AROM eval 7/21  Flexion WFL   Extension WFL   Right lateral flexion WFL   Left lateral flexion Mild pain on the right side  -----  Right rotation    Left rotation     (Blank rows = not tested)  LOWER EXTREMITY ROM:     Passive  Right eval Left eval  Hip flexion 125   Hip extension    Hip abduction    Hip adduction    Hip internal rotation Painful int he right hip    Hip external rotation    Knee flexion    Knee extension    Ankle dorsiflexion    Ankle plantarflexion    Ankle inversion    Ankle eversion     (Blank rows = not tested)  LOWER EXTREMITY MMT:    MMT Right eval Left eval Rt/Lt Right 5/13 Left 5/13 Right  6/11 Left 6/11 Right 7/21 Left 7/21  Hip flexion 32.5 34.5    37.8 38.9 33.1 38.0  Hip extension           Hip abduction 54.0 44.8 41.9/36.5 40.4  37.3 41.6 39.3 53.1 54.0  Hip adduction           Hip internal rotation           Hip external rotation           Knee flexion           Knee extension 59.8 49.5 49.1/51.0   53.4 54 53.4 48.5  Ankle dorsiflexion           Ankle plantarflexion           Ankle inversion           Ankle eversion            (Blank rows = not tested)   Modified Oswestry Current:  12%    TREATMENT DATE:  7/21 There-ex:  Strength and ROM measurement  Review of goals.   Nu-step 5 min L4  Nustep L5 x 5 mins  UE/Les Cybex leg press 3x10  70# LF knee extension  25# 2x12 LF hip abduction  45# 3x10 LF seated row 35# 3x10   7/17 Manual Therapy:  STM to bilat glutes and bilat lumbar paraspinals in prone.  PT also performed rolling to bilat glutes with the roller.  Nustep L5 x 5 mins  UE/Les Cybex leg press 3x10  70# LF knee extension  25# 2x12 LF hip abduction  45# 3x10 LF seated row 35# 3x10  7/15 Manual Therapy:  STM to bilat glutes and bilat lumbar paraspinals in prone.  Nustep L4-5 x 5 mins  UE/Les Cybex leg press 2x10  70# LF knee extension  25# 2x12 LF hip abduction  45# 3x10 LF seated row 35# 3x10-12  7/9   There-ex LF knee extension 25# 3x12 LF seated row 35# 3x12 LF hip abduction 45 lbs 3x10  LF Leg press 20 lbs 3x10   Manual Therapy:  STM to bilat glutes and R sided lumbar paraspinals in prone.   6/30 STM bilateral QL to glut med Sit<>stand with hip hinge Standing march with balance hold Shoulder flexion 5lb weights- cues for core contraction  6/11 Manual Therapy:  STM to bilat glutes and R sided lumbar paraspinals in prone. LAD to right ( hip replacement on left so not performed)   Nu-step 5 min reviewed set up for independent use in the gym    There-ex LF knee extension 20# 2x12 LF seated row 30# 3x10 LF hip abduction 45 lbs 3x10  LF Leg press 20 lbs 3x10     PATIENT EDUCATION:  Education details: HEP, symptom management, exercise form, rationale of interventions Person educated: Patient Education method: Explanation, Demonstration, Tactile cues, Verbal cues Education comprehension: verbalized understanding and returned demonstration  HOME EXERCISE PROGRAM:  Access Code: [email or web address] URL:  [email or web address]  ASSESSMENT:  CLINICAL IMPRESSION: Therapy performed progress note on the patient today.  His disability on his LEFS is decreased from 36% to 12%.  His hip abduction strength is improved significantly.  His lumbar flexion has improved significantly as well as the rotation.  Overall he is making good progress.  He is feeling more and more comfortable with his home exercise program.  The patient has 2 more visits scheduled.  We will likely discharge at that time. OBJECTIVE IMPAIRMENTS: decreased mobility.   ACTIVITY LIMITATIONS: lifting, bending, squatting, and stairs  PARTICIPATION LIMITATIONS: community activity and yard work  PERSONAL FACTORS: 1-2 comorbidities: DDD of the spine  are also affecting patient's functional outcome.   REHAB POTENTIAL: Good  CLINICAL DECISION MAKING: Stable/uncomplicated  EVALUATION COMPLEXITY: Low   GOALS: Goals reviewed with patient? Yes  SHORT TERM GOALS: Target date: 03/21/2024   Pt will report decreased morning symptoms within 1 hour of waking up. Baseline: Goal status: achieved  2.  Pt will increase knee extension dynamometer strength by 5lbs. Baseline:  Goal status: ongoing- see objective  3.  Pt will increase hip abduction dynamometer strength by 5lbs Baseline:  Goal status: NOT MET 5/13    LONG TERM GOALS: Target date:  05/02/2024  Pt will be able to go up and down 8 stairs without increase in painin order to improve ability to ambulate in the community  Baseline:  Goal status:  less pain going up stairs but still minor pain 6/11  2.  Pt will increase knee extension dynamometer strength  by 10lbs in order to improve ability to ambulate in the community  Baseline:  Goal status: improved 6/11  3.  Pt will increase hip abduction dynamometer strength by 10lbs. Baseline:  Goal status: improved 6/11    PLAN:  PT FREQUENCY: 1-2x/week  PT DURATION: 4 weeks  PLANNED INTERVENTIONS: [id number]- PT  Re-evaluation, 97110-Therapeutic exercises, 97530- Therapeutic activity, 97112- Neuromuscular re-education, 97535- Self Care, 02859- Manual therapy, [id number]- Gait training, [phone]- Aquatic Therapy, [phone]- Electrical stimulation (unattended), and [id number]- Electrical stimulation (manual).  PLAN FOR NEXT SESSION: STM to lateral hip and lumbar paraspinals. Add stretches to HEP. Progress to gym activity as tolerated.  Pt has a PACEMAKER--NO KYLA Alm Don PT DPT 06/20/24 2:59 PM

## 2024-06-22 ENCOUNTER — Other Ambulatory Visit (HOSPITAL_BASED_OUTPATIENT_CLINIC_OR_DEPARTMENT_OTHER): Payer: Self-pay | Admitting: Family Medicine

## 2024-06-22 NOTE — Therapy (Signed)
 OUTPATIENT PHYSICAL THERAPY THORACOLUMBAR TREATMENT        Patient Name: Jimmy Palmer MRN: [id number] DOB:[birth year], 83 y.o., male Today's Date: 06/24/2024  END OF SESSION:  PT End of Session - 06/23/24 1326     Visit Number 21    Number of Visits 22    Date for PT Re-Evaluation 08/01/24    Authorization Type MCR A & B    PT Start Time 1320    PT Stop Time 1404    PT Time Calculation (min) 44 min    Activity Tolerance Patient tolerated treatment well    Behavior During Therapy WFL for tasks assessed/performed                     Past Medical History:  Diagnosis Date   Abnormal electrocardiogram    Abnormal heart sounds    Allergy 2020   upon arrival to [facility]   Anemia 10/04/2023   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   Chronic kidney disease 11/07/2021   Depression 2020   receiving appropriate therapy   Herniated lumbar intervertebral disc    Hypertension    Keratosis    benign   Left bundle branch block    Pacemaker    Pure hypercholesterolemia    Sick sinus syndrome (HCC)    Sleep apnea    VT (ventricular tachycardia) (HCC)    Past Surgical History:  Procedure Laterality Date   CARDIOVERSION N/A 09/28/2020   Procedure: CARDIOVERSION;  Surgeon: Alveta Aleene PARAS, MD;  Location: [facility];  Service: Cardiovascular;  Laterality: N/A;   hip replacement Left    INSERT / REPLACE / REMOVE PACEMAKER     JOINT REPLACEMENT     partial right knee, medial side   PPM GENERATOR CHANGEOUT N/A 04/30/2023   Procedure: PPM GENERATOR CHANGEOUT;  Surgeon: Waddell Danelle ORN, MD;  Location: [facility];  Service: Cardiovascular;  Laterality: N/A;   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   Procedure: LEFT TOTAL HIP ARTHROPLASTY ANTERIOR APPROACH;  Surgeon: Vernetta Lonni GRADE, MD;  Location: [facility];  Service: Orthopedics;  Laterality: Left;   VASECTOMY     Patient Active Problem List   Diagnosis Date Noted   Anemia 10/04/2023   Hyperlipidemia  10/04/2023   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   Left hip pain 02/01/2022   Environmental and seasonal allergies 02/01/2022   Hypertension 11/07/2021   Chronic kidney disease 11/07/2021   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  He is appreciative of skilled PT and is pleased with progress.  Pt states he does not have the excruciating pain in the morning that he used to have.    PERTINENT HISTORY:  Arthritis of left hip, depression, herniated lumbar IV disc, left BBB, THA left 2023, R partial knee replacement approx 2021 PACEMAKER--NO E-STIM  PAIN:  Are you having pain? Yes: NPRS scale: 3/10 Pain location: Rt sided lower lumbar and glute Pain description: achy, naggy, sharp at times Aggravating factors: lifting, going up steps Relieving factors: heat, tylenol , back brace  PRECAUTIONS: None  RED FLAGS: None   WEIGHT BEARING RESTRICTIONS: No  FALLS:  Has patient fallen in last 6 months? No  LIVING ENVIRONMENT: Steps on back deck  OCCUPATION: Retired  PLOF: Independent  PATIENT GOALS: get pain dialed down and get some exercises to strengthen   OBJECTIVE:  Note: Objective measures were completed at Evaluation unless otherwise noted.  DIAGNOSTIC FINDINGS:  1. No acute  findings. 2. 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 3. 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 Moderate bilateral neural foraminal narrowing at the L4-5 level right worse than left. Minimal bilateral neural foraminal narrowing at L2-3 and L3-4. 4. Grade 1 anterolisthesis of L4 on L5 due to facet arthropathy.    COGNITION: Overall cognitive status: Within functional limits for tasks assessed     SENSATION: No lack of sensation  POSTURE: rounded shoulders and forward head  PALPATION: TTP lateral R glute and R paraspinal L5,S1 region  LUMBAR ROM:   AROM eval 7/21  Flexion WFL   Extension WFL   Right lateral flexion WFL   Left lateral flexion Mild pain on the right side  -----  Right rotation    Left rotation     (Blank rows = not tested)  LOWER EXTREMITY ROM:     Passive  Right eval Left eval  Hip flexion 125   Hip extension    Hip abduction    Hip adduction    Hip internal rotation Painful int he right hip    Hip external rotation    Knee flexion    Knee extension    Ankle dorsiflexion    Ankle plantarflexion    Ankle inversion    Ankle eversion     (Blank rows = not tested)  LOWER EXTREMITY MMT:    MMT Right eval Left eval Rt/Lt Right 5/13 Left 5/13 Right  6/11 Left 6/11 Right 7/21 Left 7/21  Hip flexion 32.5 34.5    37.8 38.9 33.1 38.0  Hip extension           Hip abduction 54.0 44.8 41.9/36.5 40.4  37.3 41.6 39.3 53.1 54.0  Hip adduction           Hip internal rotation           Hip external rotation           Knee flexion           Knee extension 59.8 49.5 49.1/51.0   53.4 54 53.4 48.5  Ankle dorsiflexion           Ankle plantarflexion           Ankle inversion           Ankle eversion            (Blank rows = not  tested)   Modified Oswestry Current:  12%   TREATMENT DATE:                                                                                                                                 7/28 Manual Therapy:  STM to bilat lumbar paraspinals in prone.  PT also performed rolling to bilat glutes with the roller.  PT reviewed his gym program and answered pt's questions.  Dual pulley row 12.5# x10 Standing cable row 10# x10 Standing shoulder extension 10# 2x10 Cybex leg press 70# 2x10 LF leg press 20# x10  7/21 There-ex:  Strength and ROM measurement  Review of goals.   Nu-step 5 min L4  Nustep L5 x 5 mins  UE/Les Cybex leg press 3x10  70# LF knee extension  25# 2x12 LF hip abduction  45# 3x10 LF seated row 35# 3x10   7/17 Manual Therapy:  STM to bilat glutes and bilat lumbar paraspinals in prone.  PT also performed rolling to bilat glutes with the roller.  Nustep L5 x 5 mins  UE/Les Cybex leg press 3x10  70# LF knee extension  25# 2x12 LF hip abduction  45# 3x10 LF seated row 35# 3x10  7/15 Manual Therapy:  STM to bilat glutes and bilat lumbar paraspinals in prone.  Nustep L4-5 x 5 mins  UE/Les Cybex leg press 2x10  70# LF knee extension  25# 2x12 LF hip abduction  45# 3x10 LF seated row 35# 3x10-12  7/9   There-ex LF knee extension 25# 3x12 LF seated row 35# 3x12 LF hip abduction 45 lbs 3x10  LF Leg press 20 lbs 3x10   Manual Therapy:  STM to bilat glutes and R sided lumbar paraspinals in prone.    PATIENT EDUCATION:  Education details: HEP, gym program, exercise form, and rationale of interventions Person educated: Patient Education method: Explanation, Demonstration, Tactile cues, Verbal cues Education comprehension: verbalized understanding and returned demonstration  HOME EXERCISE PROGRAM:  Access Code: [email or web address] URL: [email or web address]  ASSESSMENT:  CLINICAL IMPRESSION: Pt has made good progress in PT and pt is very appreciative of PT.  He reports he is not having the AM pain he was having prior.  PT spent time answering questions  concerning his gym program.  Pt retrieved his gym program and PT went through exercises that pt had questions about.  Pt performed some of those exercises and PT educated pt in proper positioning, set-up, and form.  Pt verbalizes and demonstrates good understanding.   He performed gym exercises well.  He responded well to Rx and had no c/o's after Rx.  Pt should be ready for discharge next visit.    OBJECTIVE IMPAIRMENTS: decreased mobility.   ACTIVITY LIMITATIONS: lifting, bending, squatting, and stairs  PARTICIPATION LIMITATIONS: community activity and yard work  PERSONAL FACTORS: 1-2 comorbidities: DDD of the spine  are also affecting patient's functional outcome.   REHAB POTENTIAL: Good  CLINICAL DECISION  MAKING: Stable/uncomplicated  EVALUATION COMPLEXITY: Low   GOALS: Goals reviewed with patient? Yes  SHORT TERM GOALS: Target date: 03/21/2024   Pt will report decreased morning symptoms within 1 hour of waking up. Baseline: Goal status: achieved  2.  Pt will increase knee extension dynamometer strength by 5lbs. Baseline:  Goal status: ongoing- see objective  3.  Pt will increase hip abduction dynamometer strength by 5lbs Baseline:  Goal status: NOT MET 5/13    LONG TERM GOALS: Target date:  05/02/2024  Pt will be able to go up and down 8 stairs without increase in painin order to improve ability to ambulate in the community  Baseline:  Goal status:  less pain going up stairs but still minor pain 6/11  2.  Pt will increase knee extension dynamometer strength by 10lbs in order to improve ability to ambulate in the community  Baseline:  Goal status: improved 6/11  3.  Pt will increase hip abduction dynamometer strength by 10lbs. Baseline:  Goal status: improved 6/11    PLAN:  PT FREQUENCY: 1-2x/week  PT DURATION: 4 weeks  PLANNED INTERVENTIONS: 97164- PT Re-evaluation, 97110-Therapeutic exercises, 97530- Therapeutic activity, 97112- Neuromuscular  re-education, 97535- Self Care, 02859- Manual therapy, [id number]- Gait training, [phone]- Aquatic Therapy, [phone]- Electrical stimulation (unattended), and [id number]- Electrical stimulation (manual).  PLAN FOR NEXT SESSION: STM to lateral hip and lumbar paraspinals.  Review HEP and gym program.  Plan for discharge next treatment.   Pt has a PACEMAKER--NO E-STIM  Leigh Minerva III PT, DPT 06/24/24 10:43 PM

## 2024-06-23 ENCOUNTER — Ambulatory Visit (HOSPITAL_BASED_OUTPATIENT_CLINIC_OR_DEPARTMENT_OTHER): Admitting: Physical Therapy

## 2024-06-23 ENCOUNTER — Other Ambulatory Visit (HOSPITAL_BASED_OUTPATIENT_CLINIC_OR_DEPARTMENT_OTHER): Payer: Self-pay

## 2024-06-23 ENCOUNTER — Encounter (HOSPITAL_BASED_OUTPATIENT_CLINIC_OR_DEPARTMENT_OTHER): Payer: Self-pay | Admitting: Physical Therapy

## 2024-06-23 DIAGNOSIS — M5459 Other low back pain: Secondary | ICD-10-CM

## 2024-06-23 DIAGNOSIS — R2689 Other abnormalities of gait and mobility: Secondary | ICD-10-CM

## 2024-06-23 DIAGNOSIS — M25551 Pain in right hip: Secondary | ICD-10-CM

## 2024-06-23 MED ORDER — SIMVASTATIN 40 MG PO TABS
40.0000 mg | ORAL_TABLET | Freq: Every evening | ORAL | 1 refills | Status: DC
  Filled 2024-06-23: qty 90, 90d supply, fill #0
  Filled 2024-09-19: qty 90, 90d supply, fill #1

## 2024-06-26 ENCOUNTER — Ambulatory Visit (HOSPITAL_BASED_OUTPATIENT_CLINIC_OR_DEPARTMENT_OTHER): Admitting: Physical Therapy

## 2024-06-26 ENCOUNTER — Encounter (HOSPITAL_BASED_OUTPATIENT_CLINIC_OR_DEPARTMENT_OTHER): Payer: Self-pay | Admitting: Physical Therapy

## 2024-06-26 DIAGNOSIS — M5459 Other low back pain: Secondary | ICD-10-CM

## 2024-06-26 DIAGNOSIS — M25551 Pain in right hip: Secondary | ICD-10-CM

## 2024-06-26 DIAGNOSIS — R2689 Other abnormalities of gait and mobility: Secondary | ICD-10-CM

## 2024-06-26 NOTE — Progress Notes (Signed)
 Remote pacemaker transmission.

## 2024-06-26 NOTE — Therapy (Signed)
 OUTPATIENT PHYSICAL THERAPY THORACOLUMBAR TREATMENT        Patient Name: TRAFTON Palmer MRN: [id number] DOB:Mar 07, 1941, 83 y.o., male Today's Date: 06/26/2024  END OF SESSION:  PT End of Session - 06/26/24 1351     Visit Number 22    Number of Visits 22    Date for PT Re-Evaluation 08/01/24    Authorization Type MCR A & B    PT Start Time 0115    PT Stop Time 0158    PT Time Calculation (min) 43 min    Activity Tolerance Patient tolerated treatment well    Behavior During Therapy [facility] for tasks assessed/performed                     Past Medical History:  Diagnosis Date   Abnormal electrocardiogram    Abnormal heart sounds    Allergy 2020   upon arrival to [facility]   Anemia 10/04/2023   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   Chronic kidney disease 11/07/2021   Depression 2020   receiving appropriate therapy   Herniated lumbar intervertebral disc    Hypertension    Keratosis    benign   Left bundle branch block    Pacemaker    Pure hypercholesterolemia    Sick sinus syndrome (HCC)    Sleep apnea    VT (ventricular tachycardia) (HCC)    Past Surgical History:  Procedure Laterality Date   CARDIOVERSION N/A 09/28/2020   Procedure: CARDIOVERSION;  Surgeon: Jimmy Aleene PARAS, MD;  Location: [facility];  Service: Cardiovascular;  Laterality: N/A;   hip replacement Left    INSERT / REPLACE / REMOVE PACEMAKER     JOINT REPLACEMENT     partial right knee, medial side   PPM GENERATOR CHANGEOUT N/A 04/30/2023   Procedure: PPM GENERATOR CHANGEOUT;  Surgeon: Jimmy Danelle ORN, MD;  [facility]: [facility];  Service: Cardiovascular;  Laterality: N/A;   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   Procedure: LEFT TOTAL HIP ARTHROPLASTY ANTERIOR APPROACH;  Surgeon: Jimmy Lonni GRADE, MD;  Location: [facility];  Service: Orthopedics;  Laterality: Left;   VASECTOMY     Patient Active Problem List   Diagnosis Date Noted   Anemia 10/04/2023   Hyperlipidemia  10/04/2023   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   Left hip pain 02/01/2022   Environmental and seasonal allergies 02/01/2022   Hypertension 11/07/2021   Chronic kidney disease 11/07/2021   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  He reports minor pain in the morning still.  He reports pain at times but mostly during the day is not having very much pain. PERTINENT HISTORY:  Arthritis of left hip, depression, herniated lumbar IV disc, left BBB, THA left 2023, R partial knee replacement approx 2021 PACEMAKER--NO E-STIM  PAIN:  Are you having pain? Yes: NPRS scale: 3/10 Pain location: Rt sided lower lumbar and glute Pain description: achy, naggy, sharp at times Aggravating factors: lifting, going up steps Relieving factors: heat, tylenol , back brace  PRECAUTIONS: None  RED FLAGS: None   WEIGHT BEARING RESTRICTIONS: No  FALLS:  Has patient fallen in last 6 months? No  LIVING ENVIRONMENT: Steps on back deck  OCCUPATION: Retired  PLOF: Independent  PATIENT GOALS: get pain dialed down and get some exercises to strengthen   OBJECTIVE:  Note: Objective measures were completed at Evaluation unless otherwise noted.  DIAGNOSTIC FINDINGS:  1. No acute findings. 2. Mild-to-moderate  spondylosis throughout the lumbar spine to include facet arthropathy. 3. 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 Moderate bilateral neural foraminal narrowing at the L4-5 level right worse than left. Minimal bilateral neural foraminal narrowing at L2-3 and L3-4. 4. Palmer 1 anterolisthesis of L4 on L5 due to facet arthropathy.    COGNITION: Overall cognitive status: Within functional limits for tasks assessed     SENSATION: No lack of sensation  POSTURE: rounded shoulders and forward head  PALPATION: TTP lateral R glute and R paraspinal L5,S1 region  LUMBAR ROM:   AROM eval 7/21  Flexion WFL   Extension WFL   Right lateral flexion WFL   Left lateral flexion Mild pain on the right side  -----  Right rotation    Left rotation     (Blank rows = not tested)  LOWER EXTREMITY ROM:     Passive  Right eval Left eval  Hip flexion 125   Hip extension    Hip abduction    Hip adduction    Hip internal rotation Painful int he right hip    Hip external rotation    Knee flexion    Knee extension    Ankle dorsiflexion    Ankle plantarflexion    Ankle inversion    Ankle eversion     (Blank rows = not tested)  LOWER EXTREMITY MMT:    MMT Right eval Left eval Rt/Lt Right 5/13 Left 5/13 Right  6/11 Left 6/11 Right 7/21 Left 7/21  Hip flexion 32.5 34.5    37.8 38.9 33.1 38.0  Hip extension           Hip abduction 54.0 44.8 41.9/36.5 40.4  37.3 41.6 39.3 53.1 54.0  Hip adduction           Hip internal rotation           Hip external rotation           Knee flexion           Knee extension 59.8 49.5 49.1/51.0   53.4 54 53.4 48.5  Ankle dorsiflexion           Ankle plantarflexion           Ankle inversion           Ankle eversion            (Blank rows = not tested)   Modified Oswestry Current:  12%   TREATMENT DATE:                                                                                                                                7/31  Nustep L5 x 5 mins  UE/Les Cybex leg press 3x10  70# LF knee extension  25# 2x12 LF hip abduction  45# 3x10 LF seated row 35# 3x10 LF lat pull down 25 lbs 3x10  Reviewed use of LF leg press.  Reviewed how to progress exercises    7/28 Manual Therapy:  STM to bilat lumbar paraspinals in prone.  PT also performed rolling to bilat glutes with the roller.  PT reviewed his gym program and answered pt's questions.  Dual pulley row 12.5# x10 Standing cable row 10# x10 Standing shoulder extension 10# 2x10 Cybex leg press 70# 2x10 LF leg press 20# x10  7/21 There-ex:  Strength and ROM measurement  Review of goals.   Nu-step 5 min L4  Nustep L5 x 5 mins  UE/Les Cybex leg press 3x10  70# LF knee extension  25# 2x12 LF hip abduction  45# 3x10 LF seated row 35# 3x10   7/17 Manual Therapy:  STM to bilat glutes and bilat lumbar paraspinals in prone.  PT also performed rolling to bilat glutes with the roller.  Nustep L5 x 5 mins  UE/Les Cybex leg press 3x10  70# LF knee extension  25# 2x12 LF hip abduction  45# 3x10 LF seated row 35# 3x10  7/15 Manual Therapy:  STM to bilat glutes and bilat lumbar paraspinals in prone.  Nustep L4-5 x 5 mins  UE/Les Cybex leg press 2x10  70# LF knee extension  25# 2x12 LF hip abduction  45# 3x10 LF seated row 35# 3x10-12  7/9   There-ex LF knee extension 25# 3x12 LF seated row 35# 3x12 LF hip abduction 45 lbs 3x10  LF Leg press 20 lbs 3x10   Manual Therapy:  STM to bilat glutes and R sided lumbar paraspinals in prone.    PATIENT EDUCATION:  Education details: HEP, gym program, exercise form, and rationale of interventions Person educated: Patient Education method: Explanation, Demonstration, Tactile cues, Verbal cues Education comprehension: verbalized understanding and returned demonstration  HOME EXERCISE PROGRAM:  Access Code: [email or web address] URL:  [email or web address]  ASSESSMENT:  CLINICAL IMPRESSION: The patient has progressed well.  Therapy reviewed exercises that we have been reviewed in the past.  These are exercise he has performed prior to his recent exacerbation of but has not performed during his current treatment.  He tolerated well.  He demonstrates a good understanding of how to adjust his his weights.  His weights are back to his previous level prior to exacerbation of pain.  He will transition to working with her trainers.  He has reached max benefit for skilled therapy at this time.  Therapy will discharge to HEP.  See below for goal specific progress.See below for goal specific  progress   OBJECTIVE IMPAIRMENTS: decreased mobility.   ACTIVITY LIMITATIONS: lifting, bending, squatting, and stairs  PARTICIPATION LIMITATIONS: community activity and yard work  PERSONAL FACTORS: 1-2 comorbidities: DDD of the spine  are also affecting patient's functional outcome.   REHAB POTENTIAL: Good  CLINICAL DECISION MAKING: Stable/uncomplicated  EVALUATION COMPLEXITY: Low   GOALS: Goals reviewed with patient? Yes  SHORT TERM GOALS: Target date: 03/21/2024   Pt will report decreased morning symptoms within 1 hour of waking up. Baseline: Goal status: achieved 7/31  2.  Pt will increase knee extension dynamometer strength by 5lbs. Baseline:  Goal status: ongoing- see objective  3.  Pt will increase hip abduction dynamometer strength by 5lbs Baseline:  Goal status:  not tested but improved strength 7/31    LONG TERM GOALS: Target date:  05/02/2024  Pt will be able to go up and down 8 stairs without increase in painin order to improve ability to ambulate in the community  Baseline:  Goal status: no problem with stairs 7/31  2.  Pt will increase knee extension dynamometer strength by 10lbs in order to improve ability to ambulate in the community  Baseline:  Goal status: not tested 7/31  3.  Pt will  increase hip abduction dynamometer strength by 10lbs. Baseline:  Goal status not tested 7/31    PLAN:  PT FREQUENCY: 1-2x/week  PT DURATION: 4 weeks  PLANNED INTERVENTIONS: 97164- PT Re-evaluation, 97110-Therapeutic exercises, 97530- Therapeutic activity, 97112- Neuromuscular re-education, 97535- Self Care, 02859- Manual therapy, [id number]- Gait training, [phone]- Aquatic Therapy, [phone]- Electrical stimulation (unattended), and [id number]- Electrical stimulation (manual).  PLAN FOR NEXT SESSION: STM to lateral hip and lumbar paraspinals.  Review HEP and gym program.  Plan for discharge next treatment.   Pt has a PACEMAKER--NO E-STIM Alm don PT DTaP  06/26/24 1:59 PM

## 2024-07-21 ENCOUNTER — Other Ambulatory Visit (HOSPITAL_BASED_OUTPATIENT_CLINIC_OR_DEPARTMENT_OTHER): Payer: Self-pay

## 2024-07-21 ENCOUNTER — Other Ambulatory Visit (HOSPITAL_BASED_OUTPATIENT_CLINIC_OR_DEPARTMENT_OTHER): Payer: Self-pay | Admitting: Family Medicine

## 2024-07-21 DIAGNOSIS — I442 Atrioventricular block, complete: Secondary | ICD-10-CM

## 2024-07-21 MED ORDER — DOXAZOSIN MESYLATE 4 MG PO TABS
4.0000 mg | ORAL_TABLET | Freq: Every day | ORAL | 1 refills | Status: AC
  Filled 2024-07-21: qty 90, 90d supply, fill #0
  Filled 2024-10-18: qty 90, 90d supply, fill #1

## 2024-07-29 ENCOUNTER — Ambulatory Visit (INDEPENDENT_AMBULATORY_CARE_PROVIDER_SITE_OTHER): Payer: Medicare Other

## 2024-07-29 DIAGNOSIS — I4819 Other persistent atrial fibrillation: Secondary | ICD-10-CM

## 2024-07-31 LAB — CUP PACEART REMOTE DEVICE CHECK
Battery Remaining Longevity: 80 mo
Battery Voltage: 3 V
Brady Statistic AP VP Percent: 28.38 %
Brady Statistic AP VS Percent: 0.01 %
Brady Statistic AS VP Percent: 71.56 %
Brady Statistic AS VS Percent: 0.06 %
Brady Statistic RA Percent Paced: 27.98 %
Brady Statistic RV Percent Paced: 99.94 %
Date Time Interrogation Session: 20250902032603
Implantable Lead Connection Status: 753985
Implantable Lead Connection Status: 753985
Implantable Lead Implant Date: 20050208
Implantable Lead Implant Date: 20050208
Implantable Lead Location: 753859
Implantable Lead Location: 753860
Implantable Lead Model: 5076
Implantable Lead Model: 5076
Implantable Pulse Generator Implant Date: 20240603
Lead Channel Impedance Value: 247 Ohm
Lead Channel Impedance Value: 323 Ohm
Lead Channel Impedance Value: 361 Ohm
Lead Channel Impedance Value: 399 Ohm
Lead Channel Pacing Threshold Amplitude: 1.125 V
Lead Channel Pacing Threshold Amplitude: 1.875 V
Lead Channel Pacing Threshold Pulse Width: 0.4 ms
Lead Channel Pacing Threshold Pulse Width: 0.4 ms
Lead Channel Sensing Intrinsic Amplitude: 2.75 mV
Lead Channel Sensing Intrinsic Amplitude: 2.75 mV
Lead Channel Sensing Intrinsic Amplitude: 7.75 mV
Lead Channel Sensing Intrinsic Amplitude: 7.75 mV
Lead Channel Setting Pacing Amplitude: 2.5 V
Lead Channel Setting Pacing Amplitude: 3 V
Lead Channel Setting Pacing Pulse Width: 0.6 ms
Lead Channel Setting Sensing Sensitivity: 4 mV
Zone Setting Status: 755011

## 2024-08-06 NOTE — Progress Notes (Signed)
 Remote PPM Transmission

## 2024-08-12 ENCOUNTER — Other Ambulatory Visit (HOSPITAL_BASED_OUTPATIENT_CLINIC_OR_DEPARTMENT_OTHER): Payer: Self-pay

## 2024-08-12 ENCOUNTER — Other Ambulatory Visit (HOSPITAL_BASED_OUTPATIENT_CLINIC_OR_DEPARTMENT_OTHER): Payer: Self-pay | Admitting: Family Medicine

## 2024-08-12 MED ORDER — LOSARTAN POTASSIUM-HCTZ 100-12.5 MG PO TABS
1.0000 | ORAL_TABLET | Freq: Every day | ORAL | 1 refills | Status: AC
  Filled 2024-08-12: qty 90, 90d supply, fill #0
  Filled 2024-11-11: qty 90, 90d supply, fill #1

## 2024-08-13 ENCOUNTER — Other Ambulatory Visit (HOSPITAL_BASED_OUTPATIENT_CLINIC_OR_DEPARTMENT_OTHER): Payer: Self-pay

## 2024-08-25 ENCOUNTER — Other Ambulatory Visit (HOSPITAL_BASED_OUTPATIENT_CLINIC_OR_DEPARTMENT_OTHER): Payer: Self-pay | Admitting: Family Medicine

## 2024-08-26 ENCOUNTER — Other Ambulatory Visit (HOSPITAL_BASED_OUTPATIENT_CLINIC_OR_DEPARTMENT_OTHER): Payer: Self-pay

## 2024-08-26 MED ORDER — SERTRALINE HCL 25 MG PO TABS
25.0000 mg | ORAL_TABLET | Freq: Every day | ORAL | 3 refills | Status: AC
  Filled 2024-08-26 – 2024-10-18 (×2): qty 90, 90d supply, fill #0

## 2024-08-30 ENCOUNTER — Other Ambulatory Visit (HOSPITAL_BASED_OUTPATIENT_CLINIC_OR_DEPARTMENT_OTHER): Payer: Self-pay

## 2024-08-30 ENCOUNTER — Other Ambulatory Visit: Payer: Self-pay | Admitting: Internal Medicine

## 2024-08-30 DIAGNOSIS — I4819 Other persistent atrial fibrillation: Secondary | ICD-10-CM

## 2024-09-01 ENCOUNTER — Other Ambulatory Visit (HOSPITAL_BASED_OUTPATIENT_CLINIC_OR_DEPARTMENT_OTHER): Payer: Self-pay

## 2024-09-01 MED ORDER — APIXABAN 5 MG PO TABS
5.0000 mg | ORAL_TABLET | Freq: Two times a day (BID) | ORAL | 0 refills | Status: DC
  Filled 2024-09-01: qty 60, 30d supply, fill #0
  Filled 2024-09-01: qty 180, 90d supply, fill #0

## 2024-09-01 NOTE — Telephone Encounter (Signed)
 Prescription refill request for Eliquis  received. Indication: Last office visit:needs appt Scr: Age:  Weight:

## 2024-09-23 ENCOUNTER — Other Ambulatory Visit (HOSPITAL_BASED_OUTPATIENT_CLINIC_OR_DEPARTMENT_OTHER): Payer: Self-pay

## 2024-09-24 ENCOUNTER — Ambulatory Visit (INDEPENDENT_AMBULATORY_CARE_PROVIDER_SITE_OTHER): Admitting: Family Medicine

## 2024-09-24 ENCOUNTER — Encounter (HOSPITAL_BASED_OUTPATIENT_CLINIC_OR_DEPARTMENT_OTHER): Payer: Self-pay | Admitting: Family Medicine

## 2024-09-24 VITALS — BP 118/66 | HR 98 | Temp 98.1°F | Resp 18 | Ht 69.0 in | Wt 169.0 lb

## 2024-09-24 DIAGNOSIS — R198 Other specified symptoms and signs involving the digestive system and abdomen: Secondary | ICD-10-CM | POA: Insufficient documentation

## 2024-09-24 DIAGNOSIS — R197 Diarrhea, unspecified: Secondary | ICD-10-CM | POA: Diagnosis not present

## 2024-09-24 LAB — COMPREHENSIVE METABOLIC PANEL WITH GFR
ALT: 16 IU/L (ref 0–44)
AST: 21 IU/L (ref 0–40)
Albumin: 4.2 g/dL (ref 3.7–4.7)
Alkaline Phosphatase: 115 IU/L (ref 48–129)
BUN/Creatinine Ratio: 14 (ref 10–24)
BUN: 22 mg/dL (ref 8–27)
Bilirubin Total: 0.5 mg/dL (ref 0.0–1.2)
CO2: 21 mmol/L (ref 20–29)
Calcium: 9.5 mg/dL (ref 8.6–10.2)
Chloride: 101 mmol/L (ref 96–106)
Creatinine, Ser: 1.53 mg/dL — ABNORMAL HIGH (ref 0.76–1.27)
Globulin, Total: 3 g/dL (ref 1.5–4.5)
Glucose: 100 mg/dL — ABNORMAL HIGH (ref 70–99)
Potassium: 4 mmol/L (ref 3.5–5.2)
Sodium: 137 mmol/L (ref 134–144)
Total Protein: 7.2 g/dL (ref 6.0–8.5)
eGFR: 45 mL/min/1.73 — ABNORMAL LOW (ref >59)

## 2024-09-24 LAB — CBC WITH DIFFERENTIAL/PLATELET
Basophils Absolute: 0 x10E3/uL (ref 0.0–0.2)
Basos: 0 %
EOS (ABSOLUTE): 0.1 x10E3/uL (ref 0.0–0.4)
Eos: 1 %
Hematocrit: 39.3 % (ref 37.5–51.0)
Hemoglobin: 12.7 g/dL — ABNORMAL LOW (ref 13.0–17.7)
Immature Grans (Abs): 0 x10E3/uL (ref 0.0–0.1)
Immature Granulocytes: 0 %
Lymphocytes Absolute: 1.5 x10E3/uL (ref 0.7–3.1)
Lymphs: 14 %
MCH: 31 pg (ref 26.6–33.0)
MCHC: 32.3 g/dL (ref 31.5–35.7)
MCV: 96 fL (ref 79–97)
Monocytes Absolute: 1.1 x10E3/uL — ABNORMAL HIGH (ref 0.1–0.9)
Monocytes: 10 %
Neutrophils Absolute: 7.9 x10E3/uL — ABNORMAL HIGH (ref 1.4–7.0)
Neutrophils: 75 %
Platelets: 232 x10E3/uL (ref 150–450)
RBC: 4.1 x10E6/uL — ABNORMAL LOW (ref 4.14–5.80)
RDW: 11.7 % (ref 11.6–15.4)
WBC: 10.6 x10E3/uL (ref 3.4–10.8)

## 2024-09-24 NOTE — Progress Notes (Signed)
    Procedures performed today:    None.  Independent interpretation of notes and tests performed by another provider:   None.  Brief History, Exam, Impression, and Recommendations:    BP 118/66   Pulse 98   Temp 98.1 F (36.7 C)   Resp 18   Ht 5' 9 (1.753 m)   Wt 169 lb (76.7 kg)   SpO2 98%   BMI 24.96 kg/m   Change in bowel movement Assessment & Plan: Patient notes that he has been having some issues with bowel movements.  He notes that first issue began after 1 night when he had about 4 alcoholic drinks in total.  He indicates that he would typically have about 2-4 drinks and so this was not necessarily unusual for him.  His main concern is that when he was drinking a glass of wine at home, he had sudden urge to defecate.  He notes that he barely made it to the bathroom as a result.  This has happened on more than 1 occasion since initial episode.  He did have a period of time when he did not have any alcoholic drinks and did take Pepto to help with symptoms.  This did help at the time and then upon resuming alcohol intake, he had return of bowel movement issues.  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 On exam, patient is in no acute distress, vital signs are stable. We did discuss considerations and we can proceed with labs today including CBC and CMP to exclude any other ongoing issues presently.  Given lack of persistent diarrhea outside of observed dietary changes, notably with alcohol, doubt that there is true underlying organic issue going on.  Advised on restraining from alcohol intake for the time being and monitoring symptoms.  Certainly if change in bowel movements persists or if having issues with diarrhea despite avoidance of alcohol, then likely would need to proceed with further evaluation.  If symptoms are predominantly tied to alcohol, would just recommend avoidance. Also provided patient with  information regarding IBS as well as low FODMAP diet today.  We will plan to follow-up in about 2 months to assess progress or sooner as needed  Orders: -     CBC with Differential/Platelet -     Comprehensive metabolic panel with GFR  Diarrhea, unspecified type -     CBC with Differential/Platelet -     Comprehensive metabolic panel with GFR  Return in about 2 months (around 11/24/2024) for 40 minutes.  Spent 33 minutes on this patient encounter, including preparation, chart review, face-to-face counseling with patient and coordination of care, and documentation of encounter   ___________________________________________ Shaquana Buel de [place], MD, ABFM, [facility]

## 2024-09-24 NOTE — Assessment & Plan Note (Signed)
 Patient notes that he has been having some issues with bowel movements.  He notes that first issue began after 1 night when he had about 4 alcoholic drinks in total.  He indicates that he would typically have about 2-4 drinks and so this was not necessarily unusual for him.  His main concern is that when he was drinking a glass of wine at home, he had sudden urge to defecate.  He notes that he barely made it to the bathroom as a result.  This has happened on more than 1 occasion since initial episode.  He did have a period of time when he did not have any alcoholic drinks and did take Pepto to help with symptoms.  This did help at the time and then upon resuming alcohol intake, he had return of bowel movement issues.  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 On exam, patient is in no acute distress, vital signs are stable. We did discuss considerations and we can proceed with labs today including CBC and CMP to exclude any other ongoing issues presently.  Given lack of persistent diarrhea outside of observed dietary changes, notably with alcohol, doubt that there is true underlying organic issue going on.  Advised on restraining from alcohol intake for the time being and monitoring symptoms.  Certainly if change in bowel movements persists or if having issues with diarrhea despite avoidance of alcohol, then likely would need to proceed with further evaluation.  If symptoms are predominantly tied to alcohol, would just recommend avoidance. Also provided patient with information regarding IBS as well as low FODMAP diet today.  We will plan to follow-up in about 2 months to assess progress or sooner as needed

## 2024-09-25 ENCOUNTER — Ambulatory Visit (HOSPITAL_BASED_OUTPATIENT_CLINIC_OR_DEPARTMENT_OTHER): Payer: Self-pay | Admitting: Family Medicine

## 2024-09-29 ENCOUNTER — Encounter: Payer: Self-pay | Admitting: Radiology

## 2024-09-30 ENCOUNTER — Other Ambulatory Visit (HOSPITAL_BASED_OUTPATIENT_CLINIC_OR_DEPARTMENT_OTHER): Payer: Self-pay

## 2024-10-06 ENCOUNTER — Ambulatory Visit (HOSPITAL_BASED_OUTPATIENT_CLINIC_OR_DEPARTMENT_OTHER): Admitting: Family Medicine

## 2024-10-06 ENCOUNTER — Ambulatory Visit: Payer: Self-pay | Admitting: Internal Medicine

## 2024-10-18 ENCOUNTER — Other Ambulatory Visit (HOSPITAL_BASED_OUTPATIENT_CLINIC_OR_DEPARTMENT_OTHER): Payer: Self-pay

## 2024-10-28 ENCOUNTER — Other Ambulatory Visit: Payer: Self-pay | Admitting: Cardiology

## 2024-10-28 ENCOUNTER — Ambulatory Visit: Payer: Medicare Other

## 2024-10-28 ENCOUNTER — Other Ambulatory Visit (HOSPITAL_BASED_OUTPATIENT_CLINIC_OR_DEPARTMENT_OTHER): Payer: Self-pay

## 2024-10-28 DIAGNOSIS — I4819 Other persistent atrial fibrillation: Secondary | ICD-10-CM

## 2024-10-28 MED ORDER — APIXABAN 5 MG PO TABS
5.0000 mg | ORAL_TABLET | Freq: Two times a day (BID) | ORAL | 0 refills | Status: DC
  Filled 2024-10-28: qty 180, 90d supply, fill #0
  Filled 2024-10-29: qty 60, 30d supply, fill #0
  Filled 2024-11-28: qty 60, 30d supply, fill #1
  Filled 2024-12-02: qty 120, 60d supply, fill #1
  Filled 2025-01-01 (×3): qty 60, 30d supply, fill #1
  Filled ????-??-?? (×2): fill #1

## 2024-10-28 NOTE — Telephone Encounter (Signed)
 Prescription refill request for Eliquis  received. Indication:afib Last office visit:needs appt Scr: Age:  Weight:  Prescription refilled

## 2024-10-29 ENCOUNTER — Other Ambulatory Visit (HOSPITAL_BASED_OUTPATIENT_CLINIC_OR_DEPARTMENT_OTHER): Payer: Self-pay

## 2024-10-29 LAB — CUP PACEART REMOTE DEVICE CHECK
Battery Remaining Longevity: 77 mo
Battery Voltage: 2.99 V
Brady Statistic AP VP Percent: 25.37 %
Brady Statistic AP VS Percent: 0.12 %
Brady Statistic AS VP Percent: 73.62 %
Brady Statistic AS VS Percent: 0.9 %
Brady Statistic RA Percent Paced: 25.33 %
Brady Statistic RV Percent Paced: 98.99 %
Date Time Interrogation Session: 20251201233121
Implantable Lead Connection Status: 753985
Implantable Lead Connection Status: 753985
Implantable Lead Implant Date: 20050208
Implantable Lead Implant Date: 20050208
Implantable Lead Location: 753859
Implantable Lead Location: 753860
Implantable Lead Model: 5076
Implantable Lead Model: 5076
Implantable Pulse Generator Implant Date: 20240603
Lead Channel Impedance Value: 247 Ohm
Lead Channel Impedance Value: 304 Ohm
Lead Channel Impedance Value: 361 Ohm
Lead Channel Impedance Value: 399 Ohm
Lead Channel Pacing Threshold Amplitude: 1.25 V
Lead Channel Pacing Threshold Amplitude: 1.75 V
Lead Channel Pacing Threshold Pulse Width: 0.4 ms
Lead Channel Pacing Threshold Pulse Width: 0.4 ms
Lead Channel Sensing Intrinsic Amplitude: 2.875 mV
Lead Channel Sensing Intrinsic Amplitude: 2.875 mV
Lead Channel Sensing Intrinsic Amplitude: 5.625 mV
Lead Channel Sensing Intrinsic Amplitude: 5.625 mV
Lead Channel Setting Pacing Amplitude: 2.5 V
Lead Channel Setting Pacing Amplitude: 3 V
Lead Channel Setting Pacing Pulse Width: 0.6 ms
Lead Channel Setting Sensing Sensitivity: 4 mV
Zone Setting Status: 755011

## 2024-10-31 ENCOUNTER — Other Ambulatory Visit (HOSPITAL_BASED_OUTPATIENT_CLINIC_OR_DEPARTMENT_OTHER): Payer: Self-pay

## 2024-10-31 NOTE — Progress Notes (Signed)
 Remote PPM Transmission

## 2024-11-11 ENCOUNTER — Other Ambulatory Visit (HOSPITAL_BASED_OUTPATIENT_CLINIC_OR_DEPARTMENT_OTHER): Payer: Self-pay

## 2024-11-13 ENCOUNTER — Other Ambulatory Visit (HOSPITAL_BASED_OUTPATIENT_CLINIC_OR_DEPARTMENT_OTHER): Payer: Self-pay

## 2024-11-17 ENCOUNTER — Telehealth (HOSPITAL_BASED_OUTPATIENT_CLINIC_OR_DEPARTMENT_OTHER): Payer: Self-pay

## 2024-11-17 NOTE — Telephone Encounter (Signed)
 Appointment scheduled for 11/18/24 at 8:30am

## 2024-11-17 NOTE — Telephone Encounter (Signed)
 Copied from CRM #[id number]. Topic: Appointments - Scheduling Inquiry for Clinic >> Nov 17, 2024 10:04 AM Sophia H wrote: Reason for CRM: Patient states he believes he has a chest cold does not want to see anyone else besides Dr. De Cuba. Advised patient I did not have any appointments available in clinic and patient requested I send a message and ask for clinic to reach out to him. States PCP is very familiar with him, states he is okay with seeing NP but wants to stick with someone on the team. # [phone]

## 2024-11-18 ENCOUNTER — Encounter (HOSPITAL_BASED_OUTPATIENT_CLINIC_OR_DEPARTMENT_OTHER): Payer: Self-pay | Admitting: Family Medicine

## 2024-11-18 ENCOUNTER — Ambulatory Visit (HOSPITAL_BASED_OUTPATIENT_CLINIC_OR_DEPARTMENT_OTHER): Admitting: Family Medicine

## 2024-11-18 ENCOUNTER — Other Ambulatory Visit (HOSPITAL_BASED_OUTPATIENT_CLINIC_OR_DEPARTMENT_OTHER): Payer: Self-pay

## 2024-11-18 VITALS — BP 110/76 | HR 91 | Temp 97.5°F | Ht 69.0 in | Wt 170.3 lb

## 2024-11-18 DIAGNOSIS — U071 COVID-19: Secondary | ICD-10-CM | POA: Diagnosis not present

## 2024-11-18 DIAGNOSIS — R509 Fever, unspecified: Secondary | ICD-10-CM

## 2024-11-18 DIAGNOSIS — J019 Acute sinusitis, unspecified: Secondary | ICD-10-CM | POA: Diagnosis not present

## 2024-11-18 DIAGNOSIS — J329 Chronic sinusitis, unspecified: Secondary | ICD-10-CM | POA: Insufficient documentation

## 2024-11-18 LAB — POCT INFLUENZA A/B
Influenza A, POC: NEGATIVE
Influenza B, POC: NEGATIVE

## 2024-11-18 LAB — POC COVID19 BINAXNOW: SARS Coronavirus 2 Ag: POSITIVE — AB

## 2024-11-18 MED ORDER — PREDNISONE 20 MG PO TABS
40.0000 mg | ORAL_TABLET | Freq: Every day | ORAL | 0 refills | Status: AC
  Filled 2024-11-18: qty 10, 5d supply, fill #0

## 2024-11-18 MED ORDER — AMOXICILLIN 875 MG PO TABS
875.0000 mg | ORAL_TABLET | Freq: Two times a day (BID) | ORAL | 0 refills | Status: AC
  Filled 2024-11-18: qty 14, 7d supply, fill #0

## 2024-11-18 NOTE — Assessment & Plan Note (Signed)
 Swab positive for COVID-19 today.  Patient is outside of the window to use medication such as Paxlovid.  Given duration of symptoms, concern for superimposed bacterial infection, discussed elsewhere.  Recommend conservative measures in addition to prescription medication sent to pharmacy.

## 2024-11-18 NOTE — Progress Notes (Signed)
" ° ° °  Procedures performed today:    None.  Independent interpretation of notes and tests performed by another provider:   None.  Brief History, Exam, Impression, and Recommendations:    BP 110/76 (BP Location: Left Arm, Patient Position: Sitting, Cuff Size: Normal)   Pulse 91   Temp (!) 97.5 F (36.4 C) (Oral)   Ht 5' 9 (1.753 m)   Wt 170 lb 4.8 oz (77.2 kg)   SpO2 99%   BMI 25.15 kg/m   Discussed the use of AI scribe software for clinical note transcription with the patient, who gave verbal consent to proceed.  History of Present Illness Jimmy Palmer is an 83 year old male who presents with fever, headache, congestion, and a raspy throat.  About a week ago, he began experiencing fever, headache, congestion, and a raspy throat, significantly impacting his well-being. Over-the-counter medications, including an allergy medication, were ineffective.  He is awaiting results from COVID-19 and flu tests. He experiences sinus pressure and congestion but no shortness of breath or difficulty breathing. He has loose stools and a history of not tolerating medications containing glycine well.  In the past, he has used Z-Paks while traveling internationally, which he found helpful. He has no known allergies to amoxicillin  or penicillin-based antibiotics. He is not experiencing ear pressure. He reports some mucus production but denies having a productive cough.  On exam, patient is in no acute distress, vital signs stable, he is afebrile today.  Cardiovascular exam with regular rate and rhythm, lungs clear to auscultation bilaterally.  Mild pharyngeal erythema, no significant cervical lymphadenopathy.  Bilateral external auditory canals with moderate cerumen present, tympanic membranes normal.  Acute non-recurrent sinusitis, unspecified location Assessment & Plan: Symptoms suggest viral infection with possible bacterial superinfection. COVID-19 test positive. - Prescribed amoxicillin  twice  daily for 7 days, prednisone  in the morning for 5 days. - Advised rest and hydration. - Recommended OTC medications for symptom relief. - Recommend evaluation at urgent care or emergency department if developing shortness of breath or trouble breathing.   Fever, unspecified fever cause -     POC COVID-19 BinaxNow -     POCT Influenza A/B  COVID-19 Assessment & Plan: Swab positive for COVID-19 today.  Patient is outside of the window to use medication such as Paxlovid.  Given duration of symptoms, concern for superimposed bacterial infection, discussed elsewhere.  Recommend conservative measures in addition to prescription medication sent to pharmacy.   Other orders -     Amoxicillin ; Take 1 tablet (875 mg total) by mouth 2 (two) times daily for 7 days.  Dispense: 14 tablet; Refill: 0 -     predniSONE ; Take 2 tablets (40 mg total) by mouth daily with breakfast.  Dispense: 10 tablet; Refill: 0  Return if symptoms worsen or fail to improve.   ___________________________________________ Erinn Huskins de Cuba, MD, ABFM, CAQSM Primary Care and Sports Medicine [facility] "

## 2024-11-18 NOTE — Assessment & Plan Note (Signed)
 Symptoms suggest viral infection with possible bacterial superinfection. COVID-19 test positive. - Prescribed amoxicillin  twice daily for 7 days, prednisone  in the morning for 5 days. - Advised rest and hydration. - Recommended OTC medications for symptom relief. - Recommend evaluation at urgent care or emergency department if developing shortness of breath or trouble breathing.

## 2024-11-19 ENCOUNTER — Ambulatory Visit: Payer: Self-pay | Admitting: Student in an Organized Health Care Education/Training Program

## 2024-11-24 ENCOUNTER — Ambulatory Visit: Payer: Self-pay

## 2024-11-24 NOTE — Telephone Encounter (Signed)
 Per Dr. De Cuba, it is recommended that patient be evaluated at ED for potential GI bleed. Was unable to reach patient on his personal number. VM left on spouse's phone.

## 2024-11-24 NOTE — Telephone Encounter (Signed)
 FYI Only or Action Required?: Action required by provider: ED advised but patient refused. Needing a call back from the office.  Patient was last seen in primary care on 11/18/2024 by de Cuba, Quintin PARAS, MD.  Called Nurse Triage reporting Stool Color Change (Black stools for five days).  Symptoms began several days ago.  Interventions attempted: Rest, hydration, or home remedies.  Symptoms are: gradually worsening.  Triage Disposition: Go to ED Now (or PCP Triage)  Patient/caregiver understands and will follow disposition?: No, refuses disposition  Copied from CRM #[id number]. Topic: Clinical - Red Word Triage >> Nov 24, 2024  3:24 PM Shanda MATSU wrote: Red Word that prompted transfer to Nurse Triage: Patient's wife is reporting black stools since starting antibiotic prescribed for COVID, caller is also reporting stomach discomfort as well as lethargy. Reason for Disposition  Patient sounds very sick or weak to the triager  Answer Assessment - Initial Assessment Questions Patient's wife called to report patient just told her he is having black tarry stools for five days. Reports abdominal pain and lethargy. Reports patient is taking 4 hour naps in the middle of the day. Did recommend patient to the ED but wife states he will refuse. Wants patient to be seen by PCP first. Needing a call back today.   1. COLOR: What color is your stool? Is that color in part or all of the stool? (e.g., black, clay-colored, green, red)      Black tarry stools  2. ONSET: When did you first notice this color change?     Wife states patient states this has been going on for five days 3. CAUSE: Have you eaten any food or taken any medicine of this color? Note: See listing in Background Information section.      no 4. OTHER SYMPTOMS: Do you have any other symptoms? (e.g., abdomen pain, diarrhea, fever, yellow eyes or skin).     Abdominal pain, lethargy  Protocols used: Stools - Unusual Color-A-AH

## 2024-11-25 ENCOUNTER — Ambulatory Visit: Payer: Self-pay

## 2024-11-25 ENCOUNTER — Emergency Department (HOSPITAL_BASED_OUTPATIENT_CLINIC_OR_DEPARTMENT_OTHER): Admission: EM | Admit: 2024-11-25 | Discharge: 2024-11-25 | Disposition: A | Discharge status: Home / Self Care

## 2024-11-25 ENCOUNTER — Other Ambulatory Visit: Payer: Self-pay

## 2024-11-25 DIAGNOSIS — Z7901 Long term (current) use of anticoagulants: Secondary | ICD-10-CM | POA: Insufficient documentation

## 2024-11-25 DIAGNOSIS — R197 Diarrhea, unspecified: Secondary | ICD-10-CM | POA: Diagnosis present

## 2024-11-25 DIAGNOSIS — E876 Hypokalemia: Secondary | ICD-10-CM | POA: Diagnosis not present

## 2024-11-25 DIAGNOSIS — I129 Hypertensive chronic kidney disease with stage 1 through stage 4 chronic kidney disease, or unspecified chronic kidney disease: Secondary | ICD-10-CM | POA: Insufficient documentation

## 2024-11-25 DIAGNOSIS — E871 Hypo-osmolality and hyponatremia: Secondary | ICD-10-CM | POA: Diagnosis not present

## 2024-11-25 DIAGNOSIS — Z79899 Other long term (current) drug therapy: Secondary | ICD-10-CM | POA: Insufficient documentation

## 2024-11-25 DIAGNOSIS — N189 Chronic kidney disease, unspecified: Secondary | ICD-10-CM | POA: Diagnosis not present

## 2024-11-25 LAB — URINALYSIS, ROUTINE W REFLEX MICROSCOPIC
Bacteria, UA: NONE SEEN
Bilirubin Urine: NEGATIVE
Glucose, UA: NEGATIVE mg/dL
Ketones, ur: NEGATIVE mg/dL
Leukocytes,Ua: NEGATIVE
Nitrite: NEGATIVE
Protein, ur: 100 mg/dL — AB
Specific Gravity, Urine: 1.02 (ref 1.005–1.030)
pH: 6 (ref 5.0–8.0)

## 2024-11-25 LAB — CBC
HCT: 41.2 % (ref 39.0–52.0)
Hemoglobin: 14 g/dL (ref 13.0–17.0)
MCH: 30.6 pg (ref 26.0–34.0)
MCHC: 34 g/dL (ref 30.0–36.0)
MCV: 90.2 fL (ref 80.0–100.0)
Platelets: 234 K/uL (ref 150–400)
RBC: 4.57 MIL/uL (ref 4.22–5.81)
RDW: 12 % (ref 11.5–15.5)
WBC: 7.1 K/uL (ref 4.0–10.5)
nRBC: 0 % (ref 0.0–0.2)

## 2024-11-25 LAB — COMPREHENSIVE METABOLIC PANEL WITH GFR
ALT: 14 U/L (ref 0–44)
AST: 19 U/L (ref 15–41)
Albumin: 3.9 g/dL (ref 3.5–5.0)
Alkaline Phosphatase: 115 U/L (ref 38–126)
Anion gap: 14 (ref 5–15)
BUN: 19 mg/dL (ref 8–23)
CO2: 23 mmol/L (ref 22–32)
Calcium: 9 mg/dL (ref 8.9–10.3)
Chloride: 94 mmol/L — ABNORMAL LOW (ref 98–111)
Creatinine, Ser: 1.27 mg/dL — ABNORMAL HIGH (ref 0.61–1.24)
GFR, Estimated: 57 mL/min — ABNORMAL LOW
Glucose, Bld: 102 mg/dL — ABNORMAL HIGH (ref 70–99)
Potassium: 3.3 mmol/L — ABNORMAL LOW (ref 3.5–5.1)
Sodium: 131 mmol/L — ABNORMAL LOW (ref 135–145)
Total Bilirubin: 1.2 mg/dL (ref 0.0–1.2)
Total Protein: 7.3 g/dL (ref 6.5–8.1)

## 2024-11-25 LAB — LIPASE, BLOOD: Lipase: 17 U/L (ref 11–51)

## 2024-11-25 MED ORDER — POTASSIUM CHLORIDE CRYS ER 20 MEQ PO TBCR
20.0000 meq | EXTENDED_RELEASE_TABLET | Freq: Once | ORAL | Status: AC
  Administered 2024-11-25: 20 meq via ORAL
  Filled 2024-11-25: qty 1

## 2024-11-25 MED ORDER — LACTATED RINGERS IV BOLUS
1000.0000 mL | Freq: Once | INTRAVENOUS | Status: AC
  Administered 2024-11-25: 1000 mL via INTRAVENOUS

## 2024-11-25 NOTE — ED Notes (Signed)

## 2024-11-25 NOTE — ED Provider Notes (Signed)
 " [place] [facility] AT [facility] Provider Note   CSN: [id number] Arrival date & time: 11/25/24  1435     Patient presents with: Diarrhea   Jimmy Palmer is a 83 y.o. male who presents with diarrhea ongoing for the last week.  The patient states that he was seen on 12/23 for URI symptoms and found out that he was COVID-positive. His PCP put him on amoxicillin  and he developed diarrhea that worsened over the last couple of days. The patient reports no associated symptoms including fever, chills, chest pain, shortness of breath, persistent vomiting, hematemesis, hematochezia, dysuria, hematuria.  Patient states that when he first started the antibiotic his stools appeared dark in color however have normalized, though he is still experiencing diarrhea. Oral intake has been normal.  Patient's last bowel movement was last night. No alleviating or exacerbating factors were clearly identified.  Patient states he is no longer experiencing URI symptoms. Patient is concerned for possible dehydration due to the diarrhea. The patient is in no acute distress.    Diarrhea      Prior to Admission medications  Medication Sig Start Date End Date Taking? Authorizing Provider  apixaban  (ELIQUIS ) 5 MG TABS tablet Take 1 tablet (5 mg total) by mouth 2 (two) times daily. needs cardiology appt for refills call office [phone] 10/28/24   Cindie Ole ONEIDA, MD  buPROPion  (WELLBUTRIN  SR) 150 MG 12 hr tablet Take 1 tablet (150 mg total) by mouth every morning. 10/04/23   de Cuba, [name], MD  doxazosin  (CARDURA ) 4 MG tablet Take 1 tablet (4 mg total) by mouth daily. 07/21/24   de Cuba, [name], MD  losartan -hydrochlorothiazide  (HYZAAR ) 100-12.5 MG tablet Take 1 tablet by mouth daily. 08/12/24   de Cuba, [name], MD  predniSONE  (DELTASONE ) 20 MG tablet Take 2 tablets (40 mg total) by mouth daily with breakfast. 11/18/24   de Cuba, Quintin PARAS, MD  sertraline  (ZOLOFT ) 25 MG tablet Take 1  tablet (25 mg total) by mouth daily. 08/26/24   de Cuba, [name], MD  simvastatin  (ZOCOR ) 40 MG tablet Take 1 tablet (40 mg total) by mouth every evening. 06/23/24   de Cuba, Quintin PARAS, MD    Allergies: Patient has no known allergies.    Review of Systems  Gastrointestinal:  Positive for diarrhea.    Updated Vital Signs BP (!) 151/79   Pulse 79   Temp 98.8 F (37.1 C)   Resp 16   SpO2 97%   Physical Exam Vitals and nursing note reviewed.  Constitutional:      General: He is not in acute distress.    Appearance: Normal appearance. He is not toxic-appearing.  HENT:     Head: Normocephalic and atraumatic.  Eyes:     Extraocular Movements: Extraocular movements intact.     Conjunctiva/sclera: Conjunctivae normal.     Pupils: Pupils are equal, round, and reactive to light.  Cardiovascular:     Rate and Rhythm: Normal rate and regular rhythm.     Pulses: Normal pulses.  Pulmonary:     Effort: Pulmonary effort is normal. No respiratory distress.     Comments: Patient has no difficulty speaking complete sentences. Abdominal:     General: Abdomen is flat.     Palpations: Abdomen is soft.     Tenderness: There is no abdominal tenderness.     Comments: No abdominal tenderness.  No CVA tenderness, guarding, rebound, or distention.  Musculoskeletal:  General: Normal range of motion.     Cervical back: Normal range of motion.  Skin:    General: Skin is warm and dry.     Capillary Refill: Capillary refill takes less than 2 seconds.  Neurological:     General: No focal deficit present.     Mental Status: He is alert. Mental status is at baseline.  Psychiatric:        Mood and Affect: Mood normal.     (all labs ordered are listed, but only abnormal results are displayed) Labs Reviewed  COMPREHENSIVE METABOLIC PANEL WITH GFR - Abnormal; Notable for the following components:      Result Value   Sodium 131 (*)    Potassium 3.3 (*)    Chloride 94 (*)    Glucose, Bld 102  (*)    Creatinine, Ser 1.27 (*)    GFR, Estimated 57 (*)    All other components within normal limits  URINALYSIS, ROUTINE W REFLEX MICROSCOPIC - Abnormal; Notable for the following components:   Hgb urine dipstick TRACE (*)    Protein, ur 100 (*)    All other components within normal limits  LIPASE, BLOOD  CBC    EKG: None  Radiology: No results found.   Procedures   Medications Ordered in the ED  lactated ringers  bolus 1,000 mL ( Intravenous Stopped 11/25/24 2021)  potassium chloride  SA (KLOR-CON  M) CR tablet 20 mEq (20 mEq Oral Given 11/25/24 1921)                                 Medical Decision Making Amount and/or Complexity of Data Reviewed Labs: ordered. Decision-making details documented in ED Course.  Risk Prescription drug management.   Patient presents to the ED for: Diarrhea This involves an extensive number of treatment options Differential diagnosis includes: Infectious vs inflammatory etiology IBS Reaction to antibiotic course  Co-morbid conditions: CKD, hypertension  Additional history/records obtained and reviewed: Additional history obtained from  outside medical records External records from outside source obtained and reviewed including PCP visit from 12/23 when patient tested positive for COVID and received amoxicillin  prescription.  Clinical Course as of 12/05/24 0758  Tue Nov 25, 2024  1530 Lipase, blood WNL [ML]  1931 Temp: 98.2 F (36.8 C) Afebrile, vital stable, patient in no acute distress [ML]  1931 Urinalysis, Routine w reflex microscopic -Urine, Clean Catch(!) Unremarkable [ML]  1931 Comprehensive metabolic panel(!) Mild hyponatremia, hypokalemia -Will give lactated ringer  bolus and PO potassium for symptomatic management [ML]  1931 CBC WNL [ML]    Clinical Course User Index [ML] Willma Duwaine CROME, PA    Data Reviewed / Actions Taken: Labs ordered/reviewed with my independent interpretation in ED course above.  Management  / Treatments: See ED course above for medications, treatments administered, and clinical rationale.   I have reviewed the patients home medicines and have made adjustments as needed  Test Considered/Diagnostic tools:  Additional diagnostic testing was considered based on the patients presenting symptoms, risk factors, and initial clinical assessment  ED Course / Reassessments: Problem List: Diarrhea 83 year old male presented for evaluation after several days of diarrhea following an antibiotic regimen.  Laboratory evaluation including showed mild hypokalemia and hyponatremia, which was corrected in the emergency department with lactated ringer  bolus and potassium supplementation.  Given report of a few episodes of dark stools, patient's CBC was assessed without acute finding, reassuring that patient did not sustain any significant  blood loss if dark stools earlier in the week was melena. Based on the patient's reassuring vital signs, benign abdominal exam, patient's report that diarrhea had subsided, and diagnostic workup, there is low clinical suspicion for acute surgical abdomen, bowel obstruction, appendicitis, cholecystitis, pancreatitis, ischemic bowel, or other emergent intra-abdominal process.  The patient was treated symptomatically with IV fluids and potassium supplementation with no progression of symptoms during the ED course.  The patient remained clinically stable and deemed appropriate for outpatient management.  Discharge instructions included strict return precautions for new or worsening diarrhea, persistent vomiting, fever, inability to tolerate oral intake, syncope, or new concerning symptoms.  Patient was advised to follow-up with his primary care provider.  Disposition: Disposition: Discharge with close follow-up with PCP and gastroenterology for further evaluation and care Rationale for disposition: Stable for discharge The disposition plan and rationale were discussed with the  patient at the bedside, all questions were addressed, and the patient demonstrated understanding.  This note was produced using Electronics Engineer. While I have reviewed and verified all clinical information, transcription errors may remain.     Final diagnoses:  Diarrhea, unspecified type    ED Discharge Orders     None          Willma Duwaine CROME, [place] 12/05/24 9240  "

## 2024-11-25 NOTE — ED Triage Notes (Signed)
 Patient states on 12/23 he was sick with diarrhea. Took amoxicillin  and prednisone . Reports last night he had many episodes of diarrhea. Unsure if it was from the amoxicillin . Covid test was positive on the 23rd. Stopped taking the amoxicillin .

## 2024-11-25 NOTE — Telephone Encounter (Signed)
 FYI Only or Action Required?: Action required by provider: refused ED.  Patient was last seen in primary care on 11/18/2024 by de Cuba, Quintin PARAS, MD.  Called Nurse Triage reporting Advice Only.  Symptoms began information only call.  Interventions attempted: Other: information only call.  Symptoms are: information only call.  Triage Disposition: Go to ED Now (overriding Call PCP When Office is Open)  Patient/caregiver understands and will follow disposition?: No, refuses disposition                                  This RN was able to get in contact with patient. Patient believes symptoms are related to taking amoxicillin  that he was prescribed at [facility] on 11/18/24. Patient reported being up the entire night last night with diarrhea. Patient also reported passing black stools, as reported by patient's spouse yesterday. This RN advised patient of PCP's direct recommendation, per chart, to proceed with evaluation at ED. Patient expressed hesitancy and requested this RN to contact his wife with ED recommendation. This RN made courtesy call to patient's wife. Patient's wife refused ED recommendation, due to wait times, and requested patient's PCP to order labs and imaging. Please advise.   Reason for Triage: patient is calling stating that he has had diahreah all night he was prescribed amoxicillin  and predisone and thinks it is the amoxicillin  that has him with the diahreah, he would like something called in to [facility] to help with it.  Reason for Disposition  [1] Caller requesting NON-URGENT health information AND [2] PCP's office is the best resource  Protocols used: Information Only Call - No Triage-A-AH

## 2024-11-25 NOTE — Discharge Instructions (Addendum)
 Thank you for visiting the Emergency Department today. It was a pleasure to be part of your healthcare team.   Your were seen today for diarrhea episodes after amoxicillin  course, and your overall test results were reassuring.  As discussed, rest, hydrate, and resuming normal diet as tolerated.  It is important to watch for warning signs such as pain, fever, or if you notice any blood in stool. If any of these happen, return to the Emergency Department or call 911.  Thank you for trusting us  with your health.

## 2024-11-25 NOTE — Telephone Encounter (Signed)
 Currently at the ER waiting to be seen.  Was seen in the office 11/18/2024 was given antibiotics at that visit. Mentions in the past had a bad experience with amoxicillin  forgot about this. Very vague in mind what happened but remembers should stay  away from it Has been having constant diarrhea and has been black and on eliquis . Advised patient important to remain at ER for work up for the black stools and diarrhea. Patient agreeable to remain at ER arrived there 10 minutes ago. Sending update to office

## 2024-11-25 NOTE — Telephone Encounter (Signed)
 This RN attempted to notify [facility] of ED refusal. No answer.

## 2024-11-28 ENCOUNTER — Other Ambulatory Visit (HOSPITAL_BASED_OUTPATIENT_CLINIC_OR_DEPARTMENT_OTHER): Payer: Self-pay

## 2024-12-01 ENCOUNTER — Other Ambulatory Visit (HOSPITAL_BASED_OUTPATIENT_CLINIC_OR_DEPARTMENT_OTHER): Payer: Self-pay

## 2024-12-02 ENCOUNTER — Other Ambulatory Visit (HOSPITAL_BASED_OUTPATIENT_CLINIC_OR_DEPARTMENT_OTHER): Payer: Self-pay

## 2024-12-10 ENCOUNTER — Other Ambulatory Visit (HOSPITAL_BASED_OUTPATIENT_CLINIC_OR_DEPARTMENT_OTHER): Payer: Self-pay

## 2024-12-13 ENCOUNTER — Other Ambulatory Visit (HOSPITAL_BASED_OUTPATIENT_CLINIC_OR_DEPARTMENT_OTHER): Payer: Self-pay

## 2024-12-22 ENCOUNTER — Other Ambulatory Visit (HOSPITAL_BASED_OUTPATIENT_CLINIC_OR_DEPARTMENT_OTHER): Payer: Self-pay | Admitting: Family Medicine

## 2024-12-23 ENCOUNTER — Other Ambulatory Visit (HOSPITAL_BASED_OUTPATIENT_CLINIC_OR_DEPARTMENT_OTHER): Payer: Self-pay

## 2024-12-23 MED ORDER — SIMVASTATIN 40 MG PO TABS
40.0000 mg | ORAL_TABLET | Freq: Every evening | ORAL | 1 refills | Status: AC
  Filled 2024-12-23: qty 90, 90d supply, fill #0

## 2024-12-23 MED ORDER — BUPROPION HCL ER (SR) 150 MG PO TB12
150.0000 mg | ORAL_TABLET | Freq: Every morning | ORAL | 3 refills | Status: AC
  Filled 2024-12-23: qty 90, 90d supply, fill #0

## 2025-01-01 ENCOUNTER — Other Ambulatory Visit (HOSPITAL_BASED_OUTPATIENT_CLINIC_OR_DEPARTMENT_OTHER): Payer: Self-pay

## 2025-01-01 MED ORDER — ELIQUIS 5 MG PO TABS
5.0000 mg | ORAL_TABLET | Freq: Two times a day (BID) | ORAL | 0 refills | Status: AC
  Filled 2025-01-01: qty 180, 90d supply, fill #0

## 2025-01-01 MED ORDER — CLINDAMYCIN HCL 150 MG PO CAPS
300.0000 mg | ORAL_CAPSULE | Freq: Three times a day (TID) | ORAL | 0 refills | Status: AC
  Filled 2025-01-01: qty 30, 5d supply, fill #0

## 2025-03-10 ENCOUNTER — Ambulatory Visit (HOSPITAL_BASED_OUTPATIENT_CLINIC_OR_DEPARTMENT_OTHER)
